# Patient Record
Sex: Male | Born: 1960 | Race: White | Hispanic: No | Marital: Married | Smoking: Current every day smoker
Health system: Southern US, Community
[De-identification: ages and names within clinical notes are randomized; demographics above are authoritative.]

## PROBLEM LIST (undated history)

## (undated) DIAGNOSIS — Z8719 Personal history of other diseases of the digestive system: Secondary | ICD-10-CM

## (undated) DIAGNOSIS — J439 Emphysema, unspecified: Secondary | ICD-10-CM

## (undated) DIAGNOSIS — I219 Acute myocardial infarction, unspecified: Secondary | ICD-10-CM

## (undated) DIAGNOSIS — Z87442 Personal history of urinary calculi: Secondary | ICD-10-CM

## (undated) DIAGNOSIS — I10 Essential (primary) hypertension: Secondary | ICD-10-CM

## (undated) DIAGNOSIS — I719 Aortic aneurysm of unspecified site, without rupture: Secondary | ICD-10-CM

## (undated) DIAGNOSIS — K219 Gastro-esophageal reflux disease without esophagitis: Secondary | ICD-10-CM

## (undated) DIAGNOSIS — L600 Ingrowing nail: Secondary | ICD-10-CM

## (undated) DIAGNOSIS — J449 Chronic obstructive pulmonary disease, unspecified: Secondary | ICD-10-CM

## (undated) DIAGNOSIS — N281 Cyst of kidney, acquired: Secondary | ICD-10-CM

## (undated) DIAGNOSIS — J189 Pneumonia, unspecified organism: Secondary | ICD-10-CM

## (undated) DIAGNOSIS — K2970 Gastritis, unspecified, without bleeding: Secondary | ICD-10-CM

## (undated) DIAGNOSIS — Z720 Tobacco use: Secondary | ICD-10-CM

## (undated) DIAGNOSIS — E785 Hyperlipidemia, unspecified: Secondary | ICD-10-CM

## (undated) DIAGNOSIS — R748 Abnormal levels of other serum enzymes: Secondary | ICD-10-CM

## (undated) DIAGNOSIS — R918 Other nonspecific abnormal finding of lung field: Secondary | ICD-10-CM

## (undated) DIAGNOSIS — J45909 Unspecified asthma, uncomplicated: Secondary | ICD-10-CM

## (undated) DIAGNOSIS — I700 Atherosclerosis of aorta: Secondary | ICD-10-CM

## (undated) DIAGNOSIS — K8000 Calculus of gallbladder with acute cholecystitis without obstruction: Secondary | ICD-10-CM

## (undated) DIAGNOSIS — E119 Type 2 diabetes mellitus without complications: Secondary | ICD-10-CM

## (undated) DIAGNOSIS — K8020 Calculus of gallbladder without cholecystitis without obstruction: Secondary | ICD-10-CM

## (undated) DIAGNOSIS — I2510 Atherosclerotic heart disease of native coronary artery without angina pectoris: Secondary | ICD-10-CM

## (undated) DIAGNOSIS — K7460 Unspecified cirrhosis of liver: Secondary | ICD-10-CM

## (undated) HISTORY — DX: Tobacco use: Z72.0

## (undated) HISTORY — DX: Abnormal levels of other serum enzymes: R74.8

## (undated) HISTORY — DX: Hyperlipidemia, unspecified: E78.5

## (undated) HISTORY — DX: Calculus of gallbladder with acute cholecystitis without obstruction: K80.00

## (undated) HISTORY — DX: Unspecified asthma, uncomplicated: J45.909

## (undated) HISTORY — DX: Gastritis, unspecified, without bleeding: K29.70

## (undated) HISTORY — DX: Essential (primary) hypertension: I10

## (undated) HISTORY — DX: Atherosclerosis of aorta: I70.0

## (undated) HISTORY — PX: APPENDECTOMY: SHX54

## (undated) HISTORY — DX: Gastro-esophageal reflux disease without esophagitis: K21.9

## (undated) HISTORY — DX: Acute myocardial infarction, unspecified: I21.9

## (undated) HISTORY — DX: Atherosclerotic heart disease of native coronary artery without angina pectoris: I25.10

## (undated) HISTORY — DX: Other nonspecific abnormal finding of lung field: R91.8

## (undated) HISTORY — DX: Emphysema, unspecified: J43.9

## (undated) HISTORY — DX: Aortic aneurysm of unspecified site, without rupture: I71.9

## (undated) HISTORY — DX: Cyst of kidney, acquired: N28.1

## (undated) HISTORY — DX: Chronic obstructive pulmonary disease, unspecified: J44.9

## (undated) HISTORY — DX: Type 2 diabetes mellitus without complications: E11.9

## (undated) HISTORY — DX: Unspecified cirrhosis of liver: K74.60

---
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
## 2003-10-25 HISTORY — PX: CORONARY ANGIOPLASTY WITH STENT PLACEMENT: SHX49

## 2008-06-06 ENCOUNTER — Ambulatory Visit: Payer: Self-pay | Admitting: Internal Medicine

## 2009-12-10 ENCOUNTER — Ambulatory Visit: Payer: Self-pay | Admitting: Gastroenterology

## 2010-01-22 ENCOUNTER — Ambulatory Visit: Payer: Self-pay | Admitting: Internal Medicine

## 2010-03-24 ENCOUNTER — Ambulatory Visit: Payer: Self-pay | Admitting: Internal Medicine

## 2010-08-09 ENCOUNTER — Emergency Department: Payer: Self-pay | Admitting: Emergency Medicine

## 2010-09-27 LAB — HM COLONOSCOPY

## 2014-01-16 ENCOUNTER — Ambulatory Visit: Payer: Self-pay

## 2014-02-25 ENCOUNTER — Ambulatory Visit: Payer: Self-pay | Admitting: Family Medicine

## 2014-02-28 ENCOUNTER — Encounter: Payer: Self-pay | Admitting: *Deleted

## 2014-03-19 ENCOUNTER — Ambulatory Visit (INDEPENDENT_AMBULATORY_CARE_PROVIDER_SITE_OTHER): Payer: BC Managed Care – PPO | Admitting: General Surgery

## 2014-03-19 ENCOUNTER — Encounter: Payer: Self-pay | Admitting: General Surgery

## 2014-03-19 VITALS — BP 140/78 | HR 78 | Resp 12 | Ht 68.0 in | Wt 200.0 lb

## 2014-03-19 DIAGNOSIS — R1011 Right upper quadrant pain: Secondary | ICD-10-CM

## 2014-03-19 NOTE — Patient Instructions (Addendum)
Patient to be scheduled for Abdominal Ultrasound. The patient is aware to call back for any questions or concerns.  Patient is scheduled for Abdominal Ultrasound at [facility] on 03/20/14 at 9:15 am. He is to have nothing to eat or drink for 6 hours prior to the ultrasound. Patient is aware of date, time, and instructions.

## 2014-03-19 NOTE — Progress Notes (Signed)
Patient ID: Scott Rosario, male   DOB: Mar 05, 1961, 53 y.o.   MRN: [id number]  Chief Complaint  Patient presents with  . Other    Evaluation of abdominal pain and gallbladder    HPI Scott Rosario is a 53 y.o. male who presents for an evaluation of abdominal pain and gallbladder. The abdominal pain has been present for approximately 3-4 months. Pain is described as sharp stabbing pain that is located in the right lower quadrant. The pain comes and goes. He has had some diarrhea that comes and goes. He avoids salads due to gas/burping. The patient states he had a CT scan done on 02/25/14.   HPI  Past Medical History  Diagnosis Date  . Heart attack   . Diabetes mellitus without complication   . Asthma   . GERD (gastroesophageal reflux disease)   . Hyperlipidemia   . Hypertension     Past Surgical History  Procedure Laterality Date  . Coronary angioplasty with stent placement  10/25/2003    x 2  . Appendectomy      History reviewed. No pertinent family history.  Social History History  Substance Use Topics  . Smoking status: Former Research scientist (life sciences)  . Smokeless tobacco: Never Used  . Alcohol Use: No    No Known Allergies  Current Outpatient Prescriptions  Medication Sig Dispense Refill  . atorvastatin (LIPITOR) 80 MG tablet Take 1 tablet by mouth daily.      . clopidogrel (PLAVIX) 75 MG tablet Take 1 tablet by mouth daily.      Marland Kitchen GLIPIZIDE XL 10 MG 24 hr tablet Take 1 tablet by mouth daily.      Marland Kitchen losartan (COZAAR) 50 MG tablet Take 1 tablet by mouth daily.      . metoprolol succinate (TOPROL-XL) 25 MG 24 hr tablet Take 1 tablet by mouth daily.      Marland Kitchen NEXIUM 40 MG capsule Take 1 capsule by mouth daily.      Marland Kitchen PROAIR HFA 108 (90 BASE) MCG/ACT inhaler       . SPIRIVA HANDIHALER 18 MCG inhalation capsule Place 1 capsule into inhaler and inhale daily.       No current facility-administered medications for this visit.    Review of Systems Review of Systems  Constitutional:  Negative.   Respiratory: Negative.   Cardiovascular: Negative.   Gastrointestinal: Positive for abdominal pain and diarrhea.    Blood pressure 140/78, pulse 78, resp. rate 12, height 5\' 8"  (1.727 m), weight 200 lb (90.719 kg).  Physical Exam Physical Exam  Constitutional: He is oriented to person, place, and time. He appears well-developed and well-nourished.  Eyes: Conjunctivae are normal. No scleral icterus.  Neck: Neck supple. No thyromegaly present.  Cardiovascular: Normal rate, regular rhythm and normal heart sounds.   No murmur heard. Pulmonary/Chest: Effort normal and breath sounds normal.  Abdominal: Soft. Normal appearance and bowel sounds are normal. There is no hepatosplenomegaly. There is no tenderness. No hernia.  Lymphadenopathy:    He has no cervical adenopathy.  Neurological: He is alert and oriented to person, place, and time.  Skin: Skin is warm and dry.    Data Reviewed   Abdominal/chest CT Scan. Polyp or stone  seen in gallbladder  Assessment    Symptoms are not typical but may well be from chronic cholecystitis and stones. Need confirmation of stones by [place]. Discussed fully with pt and he is agreeable.    Plan    Patient to be scheduled for abdominal  ultrasound. Patient to have CBC and Hepatic Function drawn today.     Patient is scheduled for Abdominal Ultrasound at [facility] on 03/20/14 at 9:15 am. He is to have nothing to eat or drink for 6 hours prior to the ultrasound. Patient is aware of date, time, and instructions.    SANKAR,SEEPLAPUTHUR G 03/20/2014, 7:04 AM

## 2014-03-20 ENCOUNTER — Encounter: Payer: Self-pay | Admitting: General Surgery

## 2014-03-20 LAB — CBC WITH DIFFERENTIAL
BASOS ABS: 0 10*3/uL (ref 0.0–0.2)
BASOS: 1 %
EOS: 3 %
Eosinophils Absolute: 0.2 10*3/uL (ref 0.0–0.4)
HEMATOCRIT: 40.9 % (ref 37.5–51.0)
Hemoglobin: 13.6 g/dL (ref 12.6–17.7)
Immature Grans (Abs): 0 10*3/uL (ref 0.0–0.1)
Immature Granulocytes: 0 %
LYMPHS: 19 %
Lymphocytes Absolute: 1.7 10*3/uL (ref 0.7–3.1)
MCH: 28.9 pg (ref 26.6–33.0)
MCHC: 33.3 g/dL (ref 31.5–35.7)
MCV: 87 fL (ref 79–97)
MONOCYTES: 5 %
Monocytes Absolute: 0.5 10*3/uL (ref 0.1–0.9)
NEUTROS PCT: 72 %
Neutrophils Absolute: 6.4 10*3/uL (ref 1.4–7.0)
Platelets: 175 10*3/uL (ref 150–379)
RBC: 4.71 x10E6/uL (ref 4.14–5.80)
RDW: 13.8 % (ref 12.3–15.4)
WBC: 8.9 10*3/uL (ref 3.4–10.8)

## 2014-03-20 LAB — HEPATIC FUNCTION PANEL
ALK PHOS: 148 IU/L — AB (ref 39–117)
ALT: 20 IU/L (ref 0–44)
AST: 15 IU/L (ref 0–40)
Albumin: 4.3 g/dL (ref 3.5–5.5)
BILIRUBIN TOTAL: 0.9 mg/dL (ref 0.0–1.2)
Bilirubin, Direct: 0.23 mg/dL (ref 0.00–0.40)
Total Protein: 6.2 g/dL (ref 6.0–8.5)

## 2014-03-25 ENCOUNTER — Telehealth: Payer: Self-pay

## 2014-03-25 ENCOUNTER — Telehealth: Payer: Self-pay | Admitting: General Surgery

## 2014-03-25 NOTE — Telephone Encounter (Signed)
Message left for patient to call back for results and to scheduled a follow up in one month.

## 2014-03-25 NOTE — Telephone Encounter (Signed)
Pt advised on results of labs and [place]. There is a mobile sludge ball in gb, no signs of acute cholecystitis. Not certain if his abd pain isn from gallbladder.  Pt advised and feel a recheck in 1 mo is appropriate. Pt agreeable with this plan.  He is to call if he has worsening pain, n/v , fever in the interval

## 2014-03-25 NOTE — Telephone Encounter (Signed)
Message copied by Lesly Rubenstein on Mon Mar 25, 2014 11:41 AM ------      Message from: Christene Lye      Created: Mon Mar 25, 2014  8:18 AM       Pt had normal LFTs. US shows a sludge ball, mobile. No stones seen, no pericholecystic fluid.       At present it is uncertain if his abd pain is result of this [place] finding. Pt advised by phone. Recheck in 1 mo- needs appt scheduled. ------

## 2014-04-08 NOTE — Telephone Encounter (Signed)
Spoke with patient and he is amendable to follow up in one month. Patient is scheduled for a follow up appointment on 04/24/14 at 3:15 pm.

## 2014-04-24 ENCOUNTER — Ambulatory Visit: Payer: BC Managed Care – PPO | Admitting: General Surgery

## 2014-05-21 ENCOUNTER — Encounter: Payer: Self-pay | Admitting: *Deleted

## 2014-11-18 ENCOUNTER — Ambulatory Visit: Payer: Self-pay | Admitting: Physician Assistant

## 2014-12-02 ENCOUNTER — Ambulatory Visit: Payer: Self-pay | Admitting: Family Medicine

## 2014-12-10 DIAGNOSIS — I214 Non-ST elevation (NSTEMI) myocardial infarction: Secondary | ICD-10-CM | POA: Insufficient documentation

## 2014-12-12 ENCOUNTER — Institutional Professional Consult (permissible substitution): Payer: Self-pay | Admitting: Internal Medicine

## 2014-12-31 ENCOUNTER — Encounter: Payer: Self-pay | Admitting: Internal Medicine

## 2014-12-31 ENCOUNTER — Encounter (INDEPENDENT_AMBULATORY_CARE_PROVIDER_SITE_OTHER): Payer: Self-pay

## 2014-12-31 ENCOUNTER — Ambulatory Visit (INDEPENDENT_AMBULATORY_CARE_PROVIDER_SITE_OTHER): Payer: 59 | Admitting: Internal Medicine

## 2014-12-31 VITALS — BP 124/78 | HR 68 | Temp 98.7°F | Ht 70.0 in | Wt 194.0 lb

## 2014-12-31 DIAGNOSIS — R918 Other nonspecific abnormal finding of lung field: Secondary | ICD-10-CM | POA: Diagnosis not present

## 2014-12-31 DIAGNOSIS — Z720 Tobacco use: Secondary | ICD-10-CM

## 2014-12-31 DIAGNOSIS — J449 Chronic obstructive pulmonary disease, unspecified: Secondary | ICD-10-CM

## 2014-12-31 MED ORDER — TIOTROPIUM BROMIDE MONOHYDRATE 18 MCG IN CAPS: 1.0000 | ORAL_CAPSULE | Freq: Every day | RESPIRATORY_TRACT | Status: DC

## 2014-12-31 MED ORDER — ALBUTEROL SULFATE HFA 108 (90 BASE) MCG/ACT IN AERS: 2.0000 | INHALATION_SPRAY | RESPIRATORY_TRACT | Status: DC | PRN

## 2014-12-31 NOTE — Progress Notes (Signed)
Date: 12/31/2014  MRN# [id number] Perfecto Purdy 1960/11/02  Referring Physician: Dr. Enid Derry  Scott Rosario is a 54 y.o. old male seen in consultation for COPD workup and multiple pulmonary nodules.   CC:  Chief Complaint  Patient presents with  . Advice Only    Referred by Dr. Sanda Klein for emphysema. Pt has some cough, sob with exertion, wheezing, pt denies chest tightness. This has been going on 3-6 months. Pt was prescribed Spiriva and a rescue inhaler which he does not use.    HPI:  Patient is a pleasant 54 year old male presents today for further work up of multiple pulmonary lung nodules and cough and COPD optimization. Patient states that he has a chronic productive morning cough sometimes with thick white sputum for the past 6 months, intermittently he has had a yellow greenish sputum for which he received antibiotics for the past. He endorses dyspnea on exertion while at work, he works as a Development worker, community. Patient states that over the past 6-8 years he gets a "upper respiratory tract infection" about twice a year. Given his recent respiratory tract infection, history of smoking, his primary care physician ordered a follow-up CT chest which showed multiple pulmonary nodules (see details below) along with panlobular and centrilobular emphysema in the background. At that time he was given a prescription for Spiriva, February 2016, but states that he has not been using it. She states that he's been smoking tobacco for about 40 years, up to 3 packs per day, now down to one pack per day. He has a history of hypertension, coronary artery disease status post stent placement, diabetes.   PMHX:   Past Medical History  Diagnosis Date  . Heart attack   . Diabetes mellitus without complication   . Asthma   . GERD (gastroesophageal reflux disease)   . Hyperlipidemia   . Hypertension    Surgical Hx:  Past Surgical History  Procedure Laterality Date  . Coronary angioplasty with stent  placement  10/25/2003    x 2  . Appendectomy     Family Hx:  Family History  Problem Relation Age of Onset  . Diabetes Mother   . Hypertension Mother   . Heart attack Father   . Epilepsy Father   . Diabetes Father    Social Hx:   History  Substance Use Topics  . Smoking status: Current Every Day Smoker -- 1.00 packs/day for 40 years    Types: Cigarettes  . Smokeless tobacco: Never Used  . Alcohol Use: No   Medication:   Current Outpatient Rx  Name  Route  Sig  Dispense  Refill  . atorvastatin (LIPITOR) 80 MG tablet   Oral   Take 1 tablet by mouth daily.         . canagliflozin (INVOKANA) 100 MG TABS tablet   Oral   Take 100 mg by mouth daily.         . clopidogrel (PLAVIX) 75 MG tablet   Oral   Take 1 tablet by mouth daily.         Marland Kitchen GLIPIZIDE XL 10 MG 24 hr tablet   Oral   Take 1 tablet by mouth daily.         Marland Kitchen losartan (COZAAR) 50 MG tablet   Oral   Take 1 tablet by mouth daily.         . metoprolol succinate (TOPROL-XL) 25 MG 24 hr tablet   Oral   Take 1 tablet by mouth daily.         Marland Kitchen  pantoprazole (PROTONIX) 40 MG tablet   Oral   Take 40 mg by mouth daily.         Marland Kitchen PROAIR HFA 108 (90 BASE) MCG/ACT inhaler               . SPIRIVA HANDIHALER 18 MCG inhalation capsule   Inhalation   Place 1 capsule into inhaler and inhale daily.             Allergies:  Review of patient's allergies indicates no known allergies.  Review of Systems: Gen:  Denies  fever, sweats, chills HEENT: Denies blurred vision, double vision, ear pain, eye pain, hearing loss, nose bleeds, sore throat Cvc:  No dizziness, chest pain or heaviness Resp:   Mild DOE, chronic morning cough mild productive Gi: Denies swallowing difficulty, stomach pain, nausea or vomiting, diarrhea, constipation, bowel incontinence Gu:  Denies bladder incontinence, burning urine Ext:   No Joint pain, stiffness or swelling Skin: No skin rash, easy bruising or bleeding or  hives Endoc:  No polyuria, polydipsia , polyphagia or weight change Psych: No depression, insomnia or hallucinations  Other:  All other systems negative  Physical Examination:   VS: BP 124/78 mmHg  Pulse 68  Temp(Src) 98.7 F (37.1 C) (Oral)  Ht 5\' 10"  (1.778 m)  Wt 194 lb (87.998 kg)  BMI 27.84 kg/m2  SpO2 97%  General Appearance: No distress  Neuro:without focal findings, mental status, speech normal, alert and oriented, cranial nerves 2-12 intact, reflexes normal and symmetric, sensation grossly normal  HEENT: PERRLA, EOM intact, no ptosis, no other lesions noticed; Mallampati 3 Pulmonary: normal breath sounds., diaphragmatic excursion normal.No wheezing, No rales;   Sputum Production:  none CardiovascularNormal S1,S2.  No m/r/g.  Abdominal aorta pulsation normal.    Abdomen: Benign, Soft, non-tender, No masses, hepatosplenomegaly, No lymphadenopathy Renal:  No costovertebral tenderness  GU:  No performed at this time. Endoc: No evident thyromegaly, no signs of acromegaly or Cushing features Skin:   warm, no rashes, no ecchymosis  Extremities: normal, no cyanosis, clubbing, no edema, warm with normal capillary refill. Other findings:none  Labs results:   Rad results: (The following images and results were reviewed by Dr. Stevenson Clinch). CT Chest  12/02/14 CLINICAL DATA: 53 year old male with Cough and congestion. Prior chest CT demonstrating pulmonary nodules. Follow-up examination.  EXAM: CT CHEST WITHOUT CONTRAST  TECHNIQUE: Multidetector CT imaging of the chest was performed following the standard protocol without IV contrast..  COMPARISON: Chest CT 02/25/2014.  FINDINGS: Mediastinum/Lymph Nodes: Heart size is normal. There is no significant pericardial fluid, thickening or pericardial calcification. There is atherosclerosis of the thoracic aorta, the great vessels of the mediastinum and the coronary arteries, including calcified atherosclerotic plaque in the left  anterior descending, left circumflex and right coronary arteries. Borderline enlarged mediastinal lymph nodes are noted, better nonspecific and are similar to the prior examination. No definite pathologically enlarged mediastinal or hilar lymph nodes. Please note that accurate exclusion of hilar adenopathy is limited on noncontrast CT scans. Esophagus is unremarkable in appearance. No axillary lymphadenopathy.  Lungs/Pleura: Multiple small pulmonary nodules are again noted, unchanged compared to the prior examination. This includes a 5 mm left upper lobe ground-glass attenuation nodule (image 21 of series 4), a 5 mm subpleural nodule associated with the major fissure in the superior aspect of the left lower lobe (image 26 of series 4), and a 5 mm left lower lobe nodule (image 49 of series 4). 4 mm right middle lobe nodule associated with the minor  fissure (image 37 of series 4), and 3 mm right upper lobe nodule (image 29 of series 4) are also unchanged in retrospect compared to the prior examination. No new suspicious appearing pulmonary nodules or masses are otherwise noted. No acute consolidative airspace disease. No pleural effusions. Mild diffuse bronchial wall thickening. Mild centrilobular and paraseptal emphysema, most evident in the lung apices bilaterally.  Upper Abdomen: Unremarkable.  Musculoskeletal/Soft Tissues: There are no aggressive appearing lytic or blastic lesions noted in the visualized portions of the skeleton.  IMPRESSION: 1. All previously noted pulmonary nodules are unchanged in size, number and distribution compared to the prior examination, and favored to be benign. One additional 12 month follow-up is recommended to ensure continued stability at this time. 2. Mild diffuse bronchial wall thickening with mild centrilobular and paraseptal emphysema. 3. Atherosclerosis, including three vessel coronary artery disease. Please note that although the presence  of coronary artery calcium documents the presence of coronary artery disease, the severity of this disease and any potential stenosis cannot be assessed on this non-gated CT examination. Assessment for potential risk factor modification, dietary therapy or pharmacologic therapy may be warranted, if clinically indicated.  Assessment and Plan: A 54 year old male past medical history of hypertension, diabetes, coronary artery disease status post placement, recent diagnosis of clinical COPD, multiple pulmonary lung nodules seen in consultation today. Multiple pulmonary nodules determined by computed tomography of lung Differential diagnosis includes: Granulomatous disease, malignancy, sarcoid, prior infection  Patient with significant smoking history. However, nodules from 9 months ago are about the same size. Will plan for follow-up CT chest in 6 months  Plan: -Given his smoking history Will plan for follow-up CT chest in 6 months with contrast. -Avoid tobacco   COPD mixed type Patient with COPD given his clinical appearance. COPD is mostly a mixed type (emphysema, bronchitis) CT chest in March 2015 with biapical emphysematous changes in both paraseptal and centrilobular.  He was given Spiriva by his primary care physician but has not been using it. I have counseled him on the use of Spiriva, and side effects, and proper menstruation of the drug. Also, I have educated patient to use a rescue inhaler, albuterol, and proper administration.  Plan: - pulmonary function testing and 6 minute walk test prior to follow up - avoid tobacco - using Spiriva on a daily basis - albuterol RESCUE inhaler - 2puff every 3-4 hours ONLY as needed for shortness of breath\wheezing\recurrent cough - we will plan for a repeat CT Chest with contrast in 6 months for pulmonary nodules.    Tobacco abuse Tobacco Cessation - Counseling regarding benefits of smoking cessation strategies was provided for more than  12 min. - Educated that at this time smoking- cessation represents the single most important step that patient can take to enhance the length and quality of live. - Educated patient regarding alternatives of behavior interventions, pharmacotherapy including NRT and non-nicotine therapy such, and combinations of both. - Patient at this time: He smoked 3 packs per day, now down to one pack per day. Patient is going to try to quit on his own.     Updated Medication List Outpatient Encounter Prescriptions as of 12/31/2014  Medication Sig  . albuterol (PROAIR HFA) 108 (90 BASE) MCG/ACT inhaler Inhale 2 puffs into the lungs every 4 (four) hours as needed for wheezing or shortness of breath.  Marland Kitchen atorvastatin (LIPITOR) 80 MG tablet Take 1 tablet by mouth daily.  . canagliflozin (INVOKANA) 100 MG TABS tablet Take 100 mg by  mouth daily.  . clopidogrel (PLAVIX) 75 MG tablet Take 1 tablet by mouth daily.  Marland Kitchen GLIPIZIDE XL 10 MG 24 hr tablet Take 1 tablet by mouth daily.  Marland Kitchen losartan (COZAAR) 50 MG tablet Take 1 tablet by mouth daily.  . metoprolol succinate (TOPROL-XL) 25 MG 24 hr tablet Take 1 tablet by mouth daily.  . pantoprazole (PROTONIX) 40 MG tablet Take 40 mg by mouth daily.  . [DISCONTINUED] PROAIR HFA 108 (90 BASE) MCG/ACT inhaler   . tiotropium (SPIRIVA HANDIHALER) 18 MCG inhalation capsule Place 1 capsule (18 mcg total) into inhaler and inhale daily.  . [DISCONTINUED] NEXIUM 40 MG capsule Take 1 capsule by mouth daily.  . [DISCONTINUED] SPIRIVA HANDIHALER 18 MCG inhalation capsule Place 1 capsule into inhaler and inhale daily.    Orders for this visit: Orders Placed This Encounter  Procedures  . CT Chest W Contrast    Standing Status: Future     Number of Occurrences:      Standing Expiration Date: 03/01/2016    Scheduling Instructions:     ON HOLD UNTIL 06/2015     Dr. Dessie Coma or Rosario Jacks to read    Order Specific Question:  Reason for Exam (SYMPTOM  OR DIAGNOSIS REQUIRED)    Answer:   EMPHYSEMA    Order Specific Question:  Preferred imaging location?    Answer:  [facility]     Thank  you for the consultation and for allowing [facility] to assist in the care of your patient. Our recommendations are noted above.  Please contact us if we can be of further service.   Vilinda Boehringer, MD [facility] Office Number: [phone]

## 2014-12-31 NOTE — Patient Instructions (Signed)
Follow up with Dr. Stevenson Clinch in 6 weeks - pulmonary function testing and 6 minute walk test prior to follow up - avoid tobacco - using Spiriva on a daily basis - albuterol RESCUE inhaler - 2puff every 3-4 hours ONLY as needed for shortness of breath\wheezing\recurrent cough - we will plan for a repeat CT Chest with contrast in 6 months for pulmonary nodules.

## 2014-12-31 NOTE — Assessment & Plan Note (Signed)
Tobacco Cessation - Counseling regarding benefits of smoking cessation strategies was provided for more than 12 min. - Educated that at this time smoking- cessation represents the single most important step that patient can take to enhance the length and quality of live. - Educated patient regarding alternatives of behavior interventions, pharmacotherapy including NRT and non-nicotine therapy such, and combinations of both. - Patient at this time: He smoked 3 packs per day, now down to one pack per day. Patient is going to try to quit on his own.

## 2014-12-31 NOTE — Assessment & Plan Note (Signed)
Differential diagnosis includes: Granulomatous disease, malignancy, sarcoid, prior infection  Patient with significant smoking history. However, nodules from 9 months ago are about the same size. Will plan for follow-up CT chest in 6 months  Plan: -Given his smoking history Will plan for follow-up CT chest in 6 months with contrast. -Avoid tobacco

## 2014-12-31 NOTE — Assessment & Plan Note (Signed)
Patient with COPD given his clinical appearance. COPD is mostly a mixed type (emphysema, bronchitis) CT chest in March 2015 with biapical emphysematous changes in both paraseptal and centrilobular.  He was given Spiriva by his primary care physician but has not been using it. I have counseled him on the use of Spiriva, and side effects, and proper menstruation of the drug. Also, I have educated patient to use a rescue inhaler, albuterol, and proper administration.  Plan: - pulmonary function testing and 6 minute walk test prior to follow up - avoid tobacco - using Spiriva on a daily basis - albuterol RESCUE inhaler - 2puff every 3-4 hours ONLY as needed for shortness of breath\wheezing\recurrent cough - we will plan for a repeat CT Chest with contrast in 6 months for pulmonary nodules.

## 2015-01-02 ENCOUNTER — Ambulatory Visit
Admit: 2015-01-02 | Disposition: A | Discharge status: Home / Self Care | Payer: Self-pay | Attending: Family Medicine | Admitting: Family Medicine

## 2015-01-03 ENCOUNTER — Other Ambulatory Visit: Payer: Self-pay | Admitting: *Deleted

## 2015-01-03 DIAGNOSIS — J449 Chronic obstructive pulmonary disease, unspecified: Secondary | ICD-10-CM

## 2015-01-03 DIAGNOSIS — R918 Other nonspecific abnormal finding of lung field: Secondary | ICD-10-CM

## 2015-01-03 NOTE — Addendum Note (Signed)
Addended by: Devona Konig on: 01/03/2015 08:43 AM   Modules accepted: Orders

## 2015-02-20 ENCOUNTER — Ambulatory Visit: Payer: 59 | Admitting: Internal Medicine

## 2015-02-20 ENCOUNTER — Ambulatory Visit: Payer: 59

## 2015-03-18 ENCOUNTER — Other Ambulatory Visit: Payer: Self-pay | Admitting: Family Medicine

## 2015-03-18 NOTE — Telephone Encounter (Signed)
Routing to provider  

## 2015-03-18 NOTE — Telephone Encounter (Signed)
E-Fax came through for refill: Rx: pantoprazole (PROTONIX) 40 MG tablet Rx copy in basket

## 2015-03-19 MED ORDER — GLIPIZIDE ER 10 MG PO TB24: 10.0000 mg | ORAL_TABLET | Freq: Every day | ORAL | Status: DC

## 2015-03-19 NOTE — Telephone Encounter (Signed)
I'm actually going to ask patient to get this from now on from the GI doctor They are evaluating him for his liver, so I'd suggest they manage this and prescribe as well The dose may need to be adjusted

## 2015-03-19 NOTE — Telephone Encounter (Signed)
Dr. Sanda Klein, he also has a refill request for Glipizide that needs to be refilled.

## 2015-03-19 NOTE — Telephone Encounter (Signed)
Left detailed message for patient to call his GI doctor for med refill.

## 2015-04-14 ENCOUNTER — Other Ambulatory Visit: Payer: Self-pay

## 2015-04-14 ENCOUNTER — Telehealth: Payer: Self-pay | Admitting: Family Medicine

## 2015-04-14 MED ORDER — ATORVASTATIN CALCIUM 80 MG PO TABS: 80.0000 mg | ORAL_TABLET | Freq: Every day | ORAL | Status: DC

## 2015-04-14 NOTE — Telephone Encounter (Signed)
Please let Chrles Selley know that I'd like to see patient for an appointment here in the office for:  diabetes Please schedule a visit with me  in the next:  month Fasting?  Yes, please Thank you, Dr. Sanda Klein

## 2015-04-14 NOTE — Telephone Encounter (Signed)
Routing to provider  

## 2015-04-14 NOTE — Telephone Encounter (Signed)
Patient was last seen on 11/21/14 for hyperlipidemia, practice partner number is [phone], and pharmacy is [facility].

## 2015-04-18 NOTE — Telephone Encounter (Signed)
Left voicemail on pt cell to call us back and schedule a f/u with fasting labs in the next month.

## 2015-05-05 ENCOUNTER — Other Ambulatory Visit: Payer: Self-pay

## 2015-05-05 DIAGNOSIS — E119 Type 2 diabetes mellitus without complications: Secondary | ICD-10-CM

## 2015-05-05 DIAGNOSIS — E785 Hyperlipidemia, unspecified: Secondary | ICD-10-CM

## 2015-05-05 DIAGNOSIS — R748 Abnormal levels of other serum enzymes: Secondary | ICD-10-CM

## 2015-05-06 ENCOUNTER — Other Ambulatory Visit: Payer: Self-pay | Admitting: Family Medicine

## 2015-05-06 DIAGNOSIS — R748 Abnormal levels of other serum enzymes: Secondary | ICD-10-CM

## 2015-05-13 ENCOUNTER — Other Ambulatory Visit: Payer: 59

## 2015-05-13 DIAGNOSIS — J449 Chronic obstructive pulmonary disease, unspecified: Secondary | ICD-10-CM | POA: Insufficient documentation

## 2015-05-13 DIAGNOSIS — I2510 Atherosclerotic heart disease of native coronary artery without angina pectoris: Secondary | ICD-10-CM | POA: Insufficient documentation

## 2015-05-13 DIAGNOSIS — R748 Abnormal levels of other serum enzymes: Secondary | ICD-10-CM

## 2015-05-13 DIAGNOSIS — IMO0002 Reserved for concepts with insufficient information to code with codable children: Secondary | ICD-10-CM | POA: Insufficient documentation

## 2015-05-13 DIAGNOSIS — I700 Atherosclerosis of aorta: Secondary | ICD-10-CM | POA: Insufficient documentation

## 2015-05-13 DIAGNOSIS — E1169 Type 2 diabetes mellitus with other specified complication: Secondary | ICD-10-CM | POA: Insufficient documentation

## 2015-05-13 DIAGNOSIS — I10 Essential (primary) hypertension: Secondary | ICD-10-CM | POA: Insufficient documentation

## 2015-05-13 DIAGNOSIS — J432 Centrilobular emphysema: Secondary | ICD-10-CM | POA: Insufficient documentation

## 2015-05-13 DIAGNOSIS — E1165 Type 2 diabetes mellitus with hyperglycemia: Secondary | ICD-10-CM | POA: Insufficient documentation

## 2015-05-13 DIAGNOSIS — E119 Type 2 diabetes mellitus without complications: Secondary | ICD-10-CM

## 2015-05-13 DIAGNOSIS — R918 Other nonspecific abnormal finding of lung field: Secondary | ICD-10-CM | POA: Insufficient documentation

## 2015-05-13 DIAGNOSIS — K219 Gastro-esophageal reflux disease without esophagitis: Secondary | ICD-10-CM | POA: Insufficient documentation

## 2015-05-13 DIAGNOSIS — K7460 Unspecified cirrhosis of liver: Secondary | ICD-10-CM | POA: Insufficient documentation

## 2015-05-13 DIAGNOSIS — Z720 Tobacco use: Secondary | ICD-10-CM | POA: Insufficient documentation

## 2015-05-13 DIAGNOSIS — E785 Hyperlipidemia, unspecified: Secondary | ICD-10-CM

## 2015-05-13 DIAGNOSIS — J439 Emphysema, unspecified: Secondary | ICD-10-CM | POA: Insufficient documentation

## 2015-05-14 LAB — COMPREHENSIVE METABOLIC PANEL
ALT: 27 IU/L (ref 0–44)
AST: 20 IU/L (ref 0–40)
Albumin/Globulin Ratio: 2 (ref 1.1–2.5)
Albumin: 4.3 g/dL (ref 3.5–5.5)
Alkaline Phosphatase: 138 IU/L — ABNORMAL HIGH (ref 39–117)
BUN/Creatinine Ratio: 19 (ref 9–20)
BUN: 17 mg/dL (ref 6–24)
Bilirubin Total: 1.3 mg/dL — ABNORMAL HIGH (ref 0.0–1.2)
CALCIUM: 9 mg/dL (ref 8.7–10.2)
CO2: 21 mmol/L (ref 18–29)
CREATININE: 0.89 mg/dL (ref 0.76–1.27)
Chloride: 103 mmol/L (ref 97–108)
GFR calc Af Amer: 111 mL/min/{1.73_m2} (ref >59)
GFR, EST NON AFRICAN AMERICAN: 96 mL/min/{1.73_m2} (ref >59)
GLOBULIN, TOTAL: 2.1 g/dL (ref 1.5–4.5)
Glucose: 155 mg/dL — ABNORMAL HIGH (ref 65–99)
Potassium: 4.7 mmol/L (ref 3.5–5.2)
SODIUM: 140 mmol/L (ref 134–144)
Total Protein: 6.4 g/dL (ref 6.0–8.5)

## 2015-05-14 LAB — LIPID PANEL W/O CHOL/HDL RATIO
CHOLESTEROL TOTAL: 111 mg/dL (ref 100–199)
HDL: 26 mg/dL — ABNORMAL LOW (ref >39)
LDL Calculated: 66 mg/dL (ref 0–99)
Triglycerides: 95 mg/dL (ref 0–149)
VLDL CHOLESTEROL CAL: 19 mg/dL (ref 5–40)

## 2015-05-14 LAB — HGB A1C W/O EAG: HEMOGLOBIN A1C: 7.4 % — AB (ref 4.8–5.6)

## 2015-05-16 ENCOUNTER — Encounter: Payer: Self-pay | Admitting: Family Medicine

## 2015-05-16 ENCOUNTER — Ambulatory Visit (INDEPENDENT_AMBULATORY_CARE_PROVIDER_SITE_OTHER): Payer: 59 | Admitting: Family Medicine

## 2015-05-16 VITALS — BP 133/75 | HR 78 | Temp 98.2°F | Ht 66.75 in | Wt 190.0 lb

## 2015-05-16 DIAGNOSIS — Z720 Tobacco use: Secondary | ICD-10-CM

## 2015-05-16 DIAGNOSIS — E119 Type 2 diabetes mellitus without complications: Secondary | ICD-10-CM

## 2015-05-16 DIAGNOSIS — I2583 Coronary atherosclerosis due to lipid rich plaque: Secondary | ICD-10-CM

## 2015-05-16 DIAGNOSIS — K7469 Other cirrhosis of liver: Secondary | ICD-10-CM

## 2015-05-16 DIAGNOSIS — I2510 Atherosclerotic heart disease of native coronary artery without angina pectoris: Secondary | ICD-10-CM

## 2015-05-16 DIAGNOSIS — E785 Hyperlipidemia, unspecified: Secondary | ICD-10-CM

## 2015-05-16 DIAGNOSIS — K219 Gastro-esophageal reflux disease without esophagitis: Secondary | ICD-10-CM | POA: Diagnosis not present

## 2015-05-16 MED ORDER — PANTOPRAZOLE SODIUM 40 MG PO TBEC: 40.0000 mg | DELAYED_RELEASE_TABLET | Freq: Every day | ORAL | Status: DC

## 2015-05-16 NOTE — Progress Notes (Signed)
BP 133/75 mmHg  Pulse 78  Temp(Src) 98.2 F (36.8 C)  Ht 5' 6.75" (1.695 m)  Wt 190 lb (86.183 kg)  BMI 30.00 kg/m2  SpO2 97%   Subjective:    Patient ID: Scott Rosario, male    DOB: 05/03/1961, 54 y.o.   MRN: [id number]  HPI: Scott Rosario is a 54 y.o. male  Chief Complaint  Patient presents with  . Diabetes  . Hypertension  . Hyperlipidemia   Diabetes; just had A1C done, 7.6; he will see eye doctor soon; not checking FSBS regularly  High blood pressure; not adding salt to food; no decongestants; no chest pain; uses aleve just occasionally  High cholesterol; maybe twelve eggs a week  He is getting a lot of sun; not using sunscreen; easy bruising on the skin of the arms  He picked up some omeprazole 20 mg at the pharmacy; takes at night for acid reflux; helping; however, he is on Plavix  Relevant past medical, surgical, family and social history reviewed and updated as indicated. Interim medical history since our last visit reviewed. Allergies and medications reviewed and updated.  Review of Systems  Constitutional: Negative for unexpected weight change.  HENT: Negative for nosebleeds.   Eyes: Positive for visual disturbance.       Due to see eye doctor soon  Cardiovascular: Negative for chest pain.  Gastrointestinal: Negative for abdominal pain and blood in stool.  Genitourinary: Negative for hematuria.  Hematological: Bruises/bleeds easily.   Per HPI unless specifically indicated above     Objective:    BP 133/75 mmHg  Pulse 78  Temp(Src) 98.2 F (36.8 C)  Ht 5' 6.75" (1.695 m)  Wt 190 lb (86.183 kg)  BMI 30.00 kg/m2  SpO2 97%  Wt Readings from Last 3 Encounters:  05/16/15 190 lb (86.183 kg)  12/06/14 191 lb (86.637 kg)  12/31/14 194 lb (87.998 kg)    Physical Exam  Constitutional: He appears well-developed and well-nourished. No distress.  HENT:  Head: Normocephalic and atraumatic.  Eyes: EOM are normal. No scleral icterus.  Neck:  No thyromegaly present.  Cardiovascular: Normal rate and regular rhythm.   Pulmonary/Chest: Effort normal and breath sounds normal.  Abdominal: Soft. Bowel sounds are normal. He exhibits no distension.  Musculoskeletal: He exhibits no edema.  Neurological: Coordination normal.  Skin: Skin is warm and dry. No pallor.  Tanned skin on chest and arms; solar ecchymoses on the extensor surfaces of both arms, scratches on the legs  Psychiatric: He has a normal mood and affect. His behavior is normal. Judgment and thought content normal.   Diabetic Foot Form - Detailed   Diabetic Foot Exam - detailed  Diabetic Foot exam was performed with the following findings:  Yes 05/16/2015  4:21 PM  Visual Foot Exam completed.:  Yes  Are the shoes appropriate in style and fit?:  Yes  Are the toenails long?:  No  Are the toenails thick?:  No  Are the toenails ingrown?:  No    Pulse Foot Exam completed.:  Yes  Right Dorsalis Pedis:  Present Left Dorsalis Pedis:  Present  Sensory Foot Exam Completed.:  Yes  Swelling:  No  Semmes-Weinstein Monofilament Test  R Site 1-Great Toe:  Pos L Site 1-Great Toe:  Pos  R Site 4:  Pos L Site 4:  Pos       Results for orders placed or performed in visit on 05/13/15  HM COLONOSCOPY  Result Value Ref Range  HM Colonoscopy per PP       Assessment & Plan:   Problem List Items Addressed This Visit      Cardiovascular and Mediastinum   CAD (coronary artery disease)    Aspirin, plavix, statin, beta-blocker; he unfortunately continues to smoke      Relevant Medications   atorvastatin (LIPITOR) 80 MG tablet     Digestive   GERD (gastroesophageal reflux disease)    Okay to take PPI but I instructed him to STOP omeprazole and use pantoprazole since he takes Plavix; other PPIs may cause Plavix to be less effective; avoid triggers for reflux, and I reviewed a list of those (caffeine, spicy foods, tomato-based foods, onions, etc.); risks of PPI discussed, do get  adequate calcium and iron and magnesium while taking      Relevant Medications   pantoprazole (PROTONIX) 40 MG tablet   Cirrhosis    Patient to see GI specialist      Relevant Orders   Ambulatory referral to Gastroenterology     Endocrine   Diabetes mellitus without complication - Primary    Last A1C reviewed; he wishes to work harder on diet rather than add medicine at this time; recheck in 3 months, A1C at that visit; foot exam by MD today; he is unfortunately not ready to quit smoking      Relevant Medications   atorvastatin (LIPITOR) 80 MG tablet     Other   Tobacco abuse    He is not ready to quit, unfortunately; he has seen pulmonologist, Dr. Stevenson Clinch      Hyperlipidemia    Reviewed last lipid panel with him; LDL at goal      Relevant Medications   atorvastatin (LIPITOR) 80 MG tablet      Follow up plan: Return in about 3 months (around 08/16/2015) for diabetes, chol, do come fasting.  An after-visit summary was printed and given to the patient at [place].  Please see the patient instructions which may contain other information and recommendations beyond what is mentioned above in the assessment and plan.  Orders Placed This Encounter  Procedures  . Ambulatory referral to Gastroenterology   Meds ordered this encounter  Medications  . DISCONTD: omeprazole (PRILOSEC) 20 MG capsule    Sig: Take 20 mg by mouth daily.  Marland Kitchen atorvastatin (LIPITOR) 80 MG tablet    Sig: Take 80 mg by mouth at bedtime.  . pantoprazole (PROTONIX) 40 MG tablet    Sig: Take 1 tablet (40 mg total) by mouth daily.    Dispense:  30 tablet    Refill:  2

## 2015-05-16 NOTE — Assessment & Plan Note (Addendum)
Okay to take PPI but I instructed him to STOP omeprazole and use pantoprazole since he takes Plavix; other PPIs may cause Plavix to be less effective; avoid triggers for reflux, and I reviewed a list of those (caffeine, spicy foods, tomato-based foods, onions, etc.); risks of PPI discussed, do get adequate calcium and iron and magnesium while taking

## 2015-05-16 NOTE — Patient Instructions (Addendum)
Limit egg yolks to no more than 3 per week Limit saturated fats like bacon and sausage and hot dogs and bologna and hamburgers STOP the omeprazole Start the new medicine for reflux Do try to use sunscreen GINA -- please ask Tiffany Foxx to check on the referral to GI made earlier, patient to see Dr. Allen Norris please Keep decreasing cigarettes and really do think about quitting Limit sweets and sugary drinks and white bread and rice and biscuits Return in 3 months for follow-up of diabetes, cholesterol, and please come fasting for next labs

## 2015-05-18 NOTE — Assessment & Plan Note (Signed)
He is not ready to quit, unfortunately; he has seen pulmonologist, Dr. Stevenson Clinch

## 2015-05-18 NOTE — Assessment & Plan Note (Signed)
Last A1C reviewed; he wishes to work harder on diet rather than add medicine at this time; recheck in 3 months, A1C at that visit; foot exam by MD today; he is unfortunately not ready to quit smoking

## 2015-05-18 NOTE — Assessment & Plan Note (Signed)
Reviewed last lipid panel with him; LDL at goal

## 2015-05-18 NOTE — Assessment & Plan Note (Signed)
Patient to see GI specialist

## 2015-05-18 NOTE — Assessment & Plan Note (Signed)
Aspirin, plavix, statin, beta-blocker; he unfortunately continues to smoke

## 2015-05-19 ENCOUNTER — Encounter: Payer: Self-pay | Admitting: Gastroenterology

## 2015-05-19 ENCOUNTER — Other Ambulatory Visit: Payer: Self-pay | Admitting: Family Medicine

## 2015-05-19 NOTE — Telephone Encounter (Signed)
He has refill on the Glipizide, but no refills on the others.

## 2015-05-20 ENCOUNTER — Other Ambulatory Visit: Payer: Self-pay

## 2015-05-27 ENCOUNTER — Ambulatory Visit: Payer: 59 | Admitting: Gastroenterology

## 2015-07-01 ENCOUNTER — Telehealth: Payer: Self-pay | Admitting: Internal Medicine

## 2015-07-01 NOTE — Telephone Encounter (Signed)
Attempted to contact patient, left message on voicemail. To [facility] for follow up Please let Dr. Stevenson Clinch know below message.. Thanks.

## 2015-07-01 NOTE — Telephone Encounter (Signed)
FYI. See message below. Thanks

## 2015-07-01 NOTE — Telephone Encounter (Signed)
Per Suanne Marker  Pt is due to have a CT Chest in Oct 2016 (6 month F/U) along with PFTs. I have attempted to contact pt multiple times (4) and left messages on pt's mobile number to return my call to schedule.  Pt has not returned any of my calls.  Just wanted to let Dr. Stevenson Clinch know.  Pt's phone number is [phone].  Thanks, Suanne Marker

## 2015-07-08 ENCOUNTER — Ambulatory Visit: Payer: 59 | Admitting: Gastroenterology

## 2015-07-21 ENCOUNTER — Other Ambulatory Visit: Payer: Self-pay | Admitting: Family Medicine

## 2015-07-21 NOTE — Telephone Encounter (Signed)
Routing to provider  

## 2015-07-21 NOTE — Telephone Encounter (Signed)
rxs approved 

## 2015-08-05 ENCOUNTER — Encounter: Payer: Self-pay | Admitting: Gastroenterology

## 2015-08-05 ENCOUNTER — Ambulatory Visit (INDEPENDENT_AMBULATORY_CARE_PROVIDER_SITE_OTHER): Payer: 59 | Admitting: Gastroenterology

## 2015-08-05 VITALS — BP 106/67 | HR 90 | Temp 99.0°F | Ht 68.0 in | Wt 193.2 lb

## 2015-08-05 DIAGNOSIS — K7460 Unspecified cirrhosis of liver: Secondary | ICD-10-CM | POA: Diagnosis not present

## 2015-08-05 DIAGNOSIS — R748 Abnormal levels of other serum enzymes: Secondary | ICD-10-CM | POA: Diagnosis not present

## 2015-08-05 NOTE — Progress Notes (Signed)
Gastroenterology Consultation  Referring Provider:     Arnetha Courser, MD Primary Care Physician:  Enid Derry, MD Primary Gastroenterologist:  Dr. Allen Norris     Reason for Consultation:     Questionable cirrhosis        HPI:   Scott Rosario is a 54 y.o. y/o male referred for consultation & management of possible cirrhosis by Dr. Enid Derry, MD.  This patient comes in with a report of possible cirrhosis. The patient had an ultrasound that showed a nodular liver back approximately 1 year ago. The patient has had blood work which showed him to have a persistently elevated alkaline phosphatase with a recent increase in his bilirubin. The patient's platelets were normal as were his other labs. She denies any history of alcohol abuse, IV drug use, homemade tattoos, or blood transfusions before 1990. The patient denies any unexplained weight loss fevers chills nausea or vomiting. The patient also denies any high risk sexual activity within IV drug user. The patient is a heavy smoker.  Past Medical History  Diagnosis Date  . Heart attack ([place])   . Asthma   . Emphysema of lung ([place])   . COPD (chronic obstructive pulmonary disease) ([place])   . Diabetes mellitus without complication ([place])   . GERD (gastroesophageal reflux disease)   . Hyperlipidemia   . Hypertension   . Tobacco use   . Pulmonary nodules     x 3, (54mm, 33mm, 86mm) chest CT February 25, 2014  . CAD (coronary artery disease)   . Cirrhosis ([place])   . Alkaline phosphatase elevation   . Aortic atherosclerosis ([place])     on CT 02/25/14  . Renal cyst     on CT 02/25/14    Past Surgical History  Procedure Laterality Date  . Coronary angioplasty with stent placement  10/25/2003    x 2  . Appendectomy      Prior to Admission medications   Medication Sig Start Date End Date Taking? Authorizing Provider  aspirin EC 81 MG tablet Take 81 mg by mouth daily.   Yes Historical Provider, MD  atorvastatin (LIPITOR) 80 MG tablet Take 80 mg by  mouth at bedtime.   Yes Historical Provider, MD  clopidogrel (PLAVIX) 75 MG tablet Take 1 tablet by mouth daily. 02/20/14  Yes Historical Provider, MD  glipiZIDE (GLUCOTROL XL) 10 MG 24 hr tablet TAKE 1 TABLET BY MOUTH ONCE DAILY. 05/19/15  Yes Arnetha Courser, MD  INVOKANA 100 MG TABS tablet TAKE 1 TABLET BY MOUTH ONCE DAILY. 05/19/15  Yes Arnetha Courser, MD  losartan (COZAAR) 50 MG tablet TAKE 1 TABLET BY MOUTH DAILY 05/19/15  Yes Arnetha Courser, MD  metoprolol succinate (TOPROL-XL) 25 MG 24 hr tablet TAKE 1 TABLET BY MOUTH EVERY DAY. 07/21/15  Yes Arnetha Courser, MD  naproxen (NAPROSYN) 500 MG tablet Take 500 mg by mouth 2 (two) times daily with a meal.   Yes Historical Provider, MD  pantoprazole (PROTONIX) 40 MG tablet TAKE 1 TABLET BY MOUTH ONCE DAILY. 07/21/15  Yes Arnetha Courser, MD  tiotropium (SPIRIVA HANDIHALER) 18 MCG inhalation capsule Place 1 capsule (18 mcg total) into inhaler and inhale daily. 12/31/14  Yes Vishal Mungal, MD  albuterol (PROAIR HFA) 108 (90 BASE) MCG/ACT inhaler Inhale 2 puffs into the lungs every 4 (four) hours as needed for wheezing or shortness of breath. Patient not taking: Reported on 05/16/2015 12/31/14   Vilinda Boehringer, MD    Family History  Problem Relation Age of Onset  . Diabetes Mother   . Hypertension Mother   . Arthritis Mother   . Hyperlipidemia Mother   . Heart attack Father   . Epilepsy Father   . Diabetes Father   . Seizures Father   . Heart disease Father   . Hyperlipidemia Father   . Hypertension Father   . Stroke Father      Social History  Substance Use Topics  . Smoking status: Current Every Day Smoker -- 1.00 packs/day for 40 years    Types: Cigarettes  . Smokeless tobacco: Never Used  . Alcohol Use: No    Allergies as of 08/05/2015  . (No Known Allergies)    Review of Systems:    All systems reviewed and negative except where noted in HPI.   Physical Exam:  BP 106/67 mmHg  Pulse 90  Temp(Src) 99 F (37.2 C) (Oral)  Ht 5\' 8"   (1.727 m)  Wt 193 lb 3.2 oz (87.635 kg)  BMI 29.38 kg/m2 No LMP for male patient. Psych:  Alert and cooperative. Normal mood and affect. General:   Alert,  Well-developed, well-nourished, pleasant and cooperative in NAD Head:  Normocephalic and atraumatic. Eyes:  Sclera clear, no icterus.   Conjunctiva pink. Ears:  Normal auditory acuity. Nose:  No deformity, discharge, or lesions. Mouth:  No deformity or lesions,oropharynx pink & moist. Neck:  Supple; no masses or thyromegaly. Lungs:  Respirations even and unlabored.  Clear throughout to auscultation.   No wheezes, crackles, or rhonchi. No acute distress. Heart:  Regular rate and rhythm; no murmurs, clicks, rubs, or gallops. Abdomen:  Normal bowel sounds.  No bruits.  Soft, non-tender and non-distended without masses, hepatosplenomegaly or hernias noted.  No guarding or rebound tenderness.  Negative Carnett sign.   Rectal:  Deferred.  Msk:  Symmetrical without gross deformities.  Good, equal movement & strength bilaterally. Pulses:  Normal pulses noted. Extremities:  No clubbing or edema.  No cyanosis. Neurologic:  Alert and oriented x3;  grossly normal neurologically. Skin:  Intact without significant lesions or rashes.  No jaundice. Lymph Nodes:  No significant cervical adenopathy. Psych:  Alert and cooperative. Normal mood and affect.  Imaging Studies: No results found.  Assessment and Plan:   Scott Rosario is a 54 y.o. y/o male who was found to have an ultrasound with a nodular liver suspicious for cirrhosis. The patient has no history of any risk factors for cirrhosis. The patient will have labs sent off for possible causes of abnormal liver enzymes with increased alkaline phosphatase with elevated bilirubin. Patient will also have a fibrosis scan sent off. The patient has been explained the plan and agrees with it.   Note: This dictation was prepared with Dragon dictation along with smaller phrase technology. Any  transcriptional errors that result from this process are unintentional.

## 2015-08-06 ENCOUNTER — Other Ambulatory Visit: Payer: Self-pay

## 2015-08-06 DIAGNOSIS — K7460 Unspecified cirrhosis of liver: Secondary | ICD-10-CM

## 2015-08-07 ENCOUNTER — Other Ambulatory Visit: Payer: Self-pay

## 2015-08-07 DIAGNOSIS — K7460 Unspecified cirrhosis of liver: Secondary | ICD-10-CM

## 2015-08-13 ENCOUNTER — Telehealth: Payer: Self-pay

## 2015-08-13 ENCOUNTER — Other Ambulatory Visit
Admission: RE | Admit: 2015-08-13 | Discharge: 2015-08-13 | Disposition: A | Discharge status: Home / Self Care | Payer: 59 | Source: Ambulatory Visit | Attending: Gastroenterology | Admitting: Gastroenterology

## 2015-08-13 DIAGNOSIS — K7460 Unspecified cirrhosis of liver: Secondary | ICD-10-CM | POA: Diagnosis not present

## 2015-08-13 LAB — HEPATIC FUNCTION PANEL
ALBUMIN: 4.4 g/dL (ref 3.5–5.0)
ALK PHOS: 145 U/L — AB (ref 38–126)
ALT: 25 U/L (ref 17–63)
AST: 23 U/L (ref 15–41)
BILIRUBIN INDIRECT: 1.1 mg/dL — AB (ref 0.3–0.9)
Bilirubin, Direct: 0.2 mg/dL (ref 0.1–0.5)
TOTAL PROTEIN: 7.3 g/dL (ref 6.5–8.1)
Total Bilirubin: 1.3 mg/dL — ABNORMAL HIGH (ref 0.3–1.2)

## 2015-08-13 LAB — PROTIME-INR
INR: 0.93
Prothrombin Time: 12.7 seconds (ref 11.4–15.0)

## 2015-08-13 NOTE — Telephone Encounter (Signed)
Pt scheduled for Abdominal US and tissue elastography at [facility] on Friday, Nov 18th @ 8:30am. Pt has been notified of date, time, location and prep instructions.

## 2015-08-14 ENCOUNTER — Telehealth: Payer: Self-pay

## 2015-08-14 LAB — CERULOPLASMIN: CERULOPLASMIN: 25.9 mg/dL (ref 16.0–31.0)

## 2015-08-14 LAB — ALPHA-1 ANTITRYPSIN PHENOTYPE: A1 ANTITRYPSIN SER: 101 mg/dL (ref 90–200)

## 2015-08-14 LAB — ANTI-SMOOTH MUSCLE ANTIBODY, IGG: F-Actin IgG: 11 Units (ref 0–19)

## 2015-08-14 LAB — HEPATITIS B SURFACE ANTIBODY, QUANTITATIVE

## 2015-08-14 LAB — HEPATITIS C ANTIBODY

## 2015-08-14 LAB — HEPATITIS A ANTIBODY, TOTAL: HEP A TOTAL AB: NEGATIVE

## 2015-08-14 LAB — HEPATITIS B SURFACE ANTIGEN: HEP B S AG: NEGATIVE

## 2015-08-14 LAB — MITOCHONDRIAL ANTIBODIES: MITOCHONDRIAL M2 AB, IGG: 9.9 U (ref 0.0–20.0)

## 2015-08-14 NOTE — Telephone Encounter (Signed)
-----   Message from Lucilla Lame, MD sent at 08/14/2015  6:56 AM EST ----- Let the patient know the Hep B was negative.

## 2015-08-14 NOTE — Telephone Encounter (Signed)
LVM for pt to return my call . Insurance has denied US/tissue elastography. Dr. Allen Norris has requested pt to have a liver biopsy. Instructs need to be given to pt.

## 2015-08-15 ENCOUNTER — Ambulatory Visit: Admission: RE | Admit: 2015-08-15 | Payer: 59 | Source: Ambulatory Visit

## 2015-08-19 ENCOUNTER — Ambulatory Visit (INDEPENDENT_AMBULATORY_CARE_PROVIDER_SITE_OTHER): Payer: 59 | Admitting: Family Medicine

## 2015-08-19 ENCOUNTER — Encounter: Payer: Self-pay | Admitting: Family Medicine

## 2015-08-19 VITALS — BP 128/82 | HR 73 | Temp 97.5°F | Wt 192.0 lb

## 2015-08-19 DIAGNOSIS — R748 Abnormal levels of other serum enzymes: Secondary | ICD-10-CM

## 2015-08-19 DIAGNOSIS — Z5181 Encounter for therapeutic drug level monitoring: Secondary | ICD-10-CM | POA: Insufficient documentation

## 2015-08-19 DIAGNOSIS — Z720 Tobacco use: Secondary | ICD-10-CM | POA: Diagnosis not present

## 2015-08-19 DIAGNOSIS — E785 Hyperlipidemia, unspecified: Secondary | ICD-10-CM

## 2015-08-19 DIAGNOSIS — E119 Type 2 diabetes mellitus without complications: Secondary | ICD-10-CM | POA: Diagnosis not present

## 2015-08-19 DIAGNOSIS — I2510 Atherosclerotic heart disease of native coronary artery without angina pectoris: Secondary | ICD-10-CM

## 2015-08-19 DIAGNOSIS — I2583 Coronary atherosclerosis due to lipid rich plaque: Secondary | ICD-10-CM

## 2015-08-19 LAB — MICROALBUMIN, URINE WAIVED
Creatinine, Urine Waived: 50 mg/dL (ref 10–300)
MICROALB, UR WAIVED: 10 mg/L (ref 0–19)
Microalb/Creat Ratio: <30 mg/g (ref <30)

## 2015-08-19 NOTE — Assessment & Plan Note (Signed)
Taking aspirin; seeing cardiologist next week; asymtpomatic

## 2015-08-19 NOTE — Assessment & Plan Note (Signed)
Seeing GI for this, imaging study pending; GI has drawn multiple tubes of blood, patient awaiting results

## 2015-08-19 NOTE — Assessment & Plan Note (Signed)
Patient is doing an excellent job with dietary modification; check fasting lipids today

## 2015-08-19 NOTE — Assessment & Plan Note (Signed)
Foot exam done by MD today; encouraged nightly checks at home; he will call for eye exams; check A1C today

## 2015-08-19 NOTE — Progress Notes (Signed)
BP 128/82 mmHg  Pulse 73  Temp(Src) 97.5 F (36.4 C)  Wt 192 lb (87.091 kg)  SpO2 99%   Subjective:    Patient ID: Daxter Stubbins, male    DOB: June 02, 1961, 54 y.o.   MRN: [id number]  HPI: Camerron Garnier is a 54 y.o. male  Chief Complaint  Patient presents with  . Diabetes    Advised patient to sch an eye exam  . Hypertension  . Hyperlipidemia   Diabetes; not checking sugars much with my blessing; he tries to watch diet; wheat bread; diet Mt Dew; sweet n' low instead of sugar; checks feet regularly; has callus on the left foot that he trims occasionally, very careful; last eye exam was over a year ago and he will call to make appt; no problems with medicines  Hypertension; controlled today; just left office; not sure about DASH guidelines  High cholesterol; cut down on the eggs and now eating Special K; he is eager to see the change in his cholesterol panel; he has also been more active and cut down on smoking (he is not ready to quit yet)  GERD; doing pretty well as long as he takes the medicine  Cirrhosis; seeing Dr. Allen Norris; they drew 7 vials of blood labs week; he wanted to do an [place] but the insurance company denied  He will be going to see Dr. Nehemiah Massed next week; no chest pain; no shortness of breath; no heart palpitations; he has been very active since he left here  Relevant past medical, surgical, family and social history reviewed and updated as indicated. Interim medical history since our last visit reviewed. Allergies and medications reviewed and updated.  Review of Systems Per HPI unless specifically indicated above     Objective:    BP 128/82 mmHg  Pulse 73  Temp(Src) 97.5 F (36.4 C)  Wt 192 lb (87.091 kg)  SpO2 99%  Wt Readings from Last 3 Encounters:  08/19/15 192 lb (87.091 kg)  08/05/15 193 lb 3.2 oz (87.635 kg)  05/16/15 190 lb (86.183 kg)    Physical Exam  Constitutional: He appears well-developed and well-nourished. No distress.  HENT:   Head: Normocephalic and atraumatic.  Eyes: EOM are normal. No scleral icterus.  Neck: No thyromegaly present.  Cardiovascular: Normal rate and regular rhythm.   Pulmonary/Chest: Effort normal and breath sounds normal.  Abdominal: Soft. Bowel sounds are normal. He exhibits no distension.  Musculoskeletal: He exhibits no edema.  Neurological: Coordination normal.  Skin: Skin is warm and dry. No pallor.  Psychiatric: He has a normal mood and affect. His behavior is normal. Judgment and thought content normal.   Diabetic Foot Form - Detailed   Diabetic Foot Exam - detailed  Diabetic Foot exam was performed with the following findings:  Yes 08/19/2015  8:36 AM  Visual Foot Exam completed.:  Yes  Are the toenails long?:  No  Are the toenails thick?:  No  Are the toenails ingrown?:  No    Pulse Foot Exam completed.:  Yes  Right Dorsalis Pedis:  Present Left Dorsalis Pedis:  Present  Sensory Foot Exam Completed.:  Yes  Swelling:  No  Semmes-Weinstein Monofilament Test  R Site 1-Great Toe:  Pos L Site 1-Great Toe:  Pos  R Site 4:  Pos L Site 4:  Pos  R Site 5:  Pos L Site 5:  Pos    Comments:  Linear callus along lateral left heel      Results for  orders placed or performed during the hospital encounter of 08/13/15  Ceruloplasmin  Result Value Ref Range   Ceruloplasmin 25.9 16.0 - 31.0 mg/dL  Hepatic function panel  Result Value Ref Range   Total Protein 7.3 6.5 - 8.1 g/dL   Albumin 4.4 3.5 - 5.0 g/dL   AST 23 15 - 41 U/L   ALT 25 17 - 63 U/L   Alkaline Phosphatase 145 (H) 38 - 126 U/L   Total Bilirubin 1.3 (H) 0.3 - 1.2 mg/dL   Bilirubin, Direct 0.2 0.1 - 0.5 mg/dL   Indirect Bilirubin 1.1 (H) 0.3 - 0.9 mg/dL  Mitochondrial antibodies  Result Value Ref Range   Mitochondrial M2 Ab, IgG 9.9 0.0 - 20.0 Units  Hepatitis B surface antigen  Result Value Ref Range   Hepatitis B Surface Ag Negative Negative  Hepatitis C antibody  Result Value Ref Range   HCV Ab <0.1 0.0 - 0.9  s/co ratio  Hepatitis A antibody, total  Result Value Ref Range   Hep A Total Ab Negative Negative  Hepatitis B surface antibody  Result Value Ref Range   Hepatitis B-Post <3.1 (L) Immunity>9.9 mIU/mL  Anti-smooth muscle antibody, IgG  Result Value Ref Range   F-Actin IgG 11 0 - 19 Units  Alpha-1 antitrypsin phenotype  Result Value Ref Range   A-1 Antitrypsin Pheno MZ    A-1 Antitrypsin, Ser 101 90 - 200 mg/dL  Protime-INR  Result Value Ref Range   Prothrombin Time 12.7 11.4 - 15.0 seconds   INR 0.93       Assessment & Plan:   Problem List Items Addressed This Visit      Cardiovascular and Mediastinum   CAD (coronary artery disease)    Taking aspirin; seeing cardiologist next week; asymtpomatic        Endocrine   Diabetes mellitus without complication ([place]) - Primary    Foot exam done by MD today; encouraged nightly checks at home; he will call for eye exams; check A1C today      Relevant Orders   Hgb A1c w/o eAG   Microalbumin, Urine Waived     Other   Tobacco abuse    He is working on thinking about quitting smoking, not quite there yet; he has cut back; encouragement given      Hyperlipidemia    Patient is doing an excellent job with dietary modification; check fasting lipids today      Relevant Orders   Lipid Panel w/o Chol/HDL Ratio   Alkaline phosphatase elevation    Seeing GI for this, imaging study pending; GI has drawn multiple tubes of blood, patient awaiting results      Medication monitoring encounter   Relevant Orders   CBC with Differential/Platelet   Basic metabolic panel      Follow up plan: Return in about 6 months (around 02/16/2016).  Orders Placed This Encounter  Procedures  . CBC with Differential/Platelet  . Hgb A1c w/o eAG  . Microalbumin, Urine Waived  . Lipid Panel w/o Chol/HDL Ratio  . Basic metabolic panel   An after-visit summary was printed and given to the patient at [facility].  Please see the patient instructions  which may contain other information and recommendations beyond what is mentioned above in the assessment and plan.

## 2015-08-19 NOTE — Patient Instructions (Addendum)
Please call [facility] to schedule your yearly diabetic eye exam Continue to follow-up with Dr. Allen Norris and Dr. Nehemiah Massed Really consider quitting smoking; it will be the best thing you can do for your health; call 1-800-QUIT-NOW for assistance if desired Check feet every night and alert me of any new problems Return in 6 months, sooner if needed  DASH Eating Plan DASH stands for "Dietary Approaches to Stop Hypertension." The DASH eating plan is a healthy eating plan that has been shown to reduce high blood pressure (hypertension). Additional health benefits may include reducing the risk of type 2 diabetes mellitus, heart disease, and stroke. The DASH eating plan may also help with weight loss. WHAT DO I NEED TO KNOW ABOUT THE DASH EATING PLAN? For the DASH eating plan, you will follow these general guidelines:  Choose foods with a percent daily value for sodium of less than 5% (as listed on the food label).  Use salt-free seasonings or herbs instead of table salt or sea salt.  Check with your health care provider or pharmacist before using salt substitutes.  Eat lower-sodium products, often labeled as "lower sodium" or "no salt added."  Eat fresh foods.  Eat more vegetables, fruits, and low-fat dairy products.  Choose whole grains. Look for the word "whole" as the first word in the ingredient list.  Choose fish and skinless chicken or [place] more often than red meat. Limit fish, poultry, and meat to 6 oz (170 g) each day.  Limit sweets, desserts, sugars, and sugary drinks.  Choose heart-healthy fats.  Limit cheese to 1 oz (28 g) per day.  Eat more home-cooked food and less restaurant, buffet, and fast food.  Limit fried foods.  Cook foods using methods other than frying.  Limit canned vegetables. If you do use them, rinse them well to decrease the sodium.  When eating at a restaurant, ask that your food be prepared with less salt, or no salt if possible. WHAT FOODS CAN  I EAT? Seek help from a dietitian for individual calorie needs. Grains Whole grain or whole wheat bread. Brown rice. Whole grain or whole wheat pasta. Quinoa, bulgur, and whole grain cereals. Low-sodium cereals. Corn or whole wheat flour tortillas. Whole grain cornbread. Whole grain crackers. Low-sodium crackers. Vegetables Fresh or frozen vegetables (raw, steamed, roasted, or grilled). Low-sodium or reduced-sodium tomato and vegetable juices. Low-sodium or reduced-sodium tomato sauce and paste. Low-sodium or reduced-sodium canned vegetables.  Fruits All fresh, canned (in natural juice), or frozen fruits. Meat and Other Protein Products Ground beef (85% or leaner), grass-fed beef, or beef trimmed of fat. Skinless chicken or [place]. Ground chicken or [place]. Pork trimmed of fat. All fish and seafood. Eggs. Dried beans, peas, or lentils. Unsalted nuts and seeds. Unsalted canned beans. Dairy Low-fat dairy products, such as skim or 1% milk, 2% or reduced-fat cheeses, low-fat ricotta or cottage cheese, or plain low-fat yogurt. Low-sodium or reduced-sodium cheeses. Fats and Oils Tub margarines without trans fats. Light or reduced-fat mayonnaise and salad dressings (reduced sodium). Avocado. Safflower, olive, or canola oils. Natural peanut or almond butter. Other Unsalted popcorn and pretzels. The items listed above may not be a complete list of recommended foods or beverages. Contact your dietitian for more options. WHAT FOODS ARE NOT RECOMMENDED? Grains White bread. White pasta. White rice. Refined cornbread. Bagels and croissants. Crackers that contain trans fat. Vegetables Creamed or fried vegetables. Vegetables in a cheese sauce. Regular canned vegetables. Regular canned tomato sauce and paste. Regular tomato and  vegetable juices. Fruits Dried fruits. Canned fruit in light or heavy syrup. Fruit juice. Meat and Other Protein Products Fatty cuts of meat. Ribs, chicken wings, bacon, sausage,  bologna, salami, chitterlings, fatback, hot dogs, bratwurst, and packaged luncheon meats. Salted nuts and seeds. Canned beans with salt. Dairy Whole or 2% milk, cream, half-and-half, and cream cheese. Whole-fat or sweetened yogurt. Full-fat cheeses or blue cheese. Nondairy creamers and whipped toppings. Processed cheese, cheese spreads, or cheese curds. Condiments Onion and garlic salt, seasoned salt, table salt, and sea salt. Canned and packaged gravies. Worcestershire sauce. Tartar sauce. Barbecue sauce. Teriyaki sauce. Soy sauce, including reduced sodium. Steak sauce. Fish sauce. Oyster sauce. Cocktail sauce. Horseradish. Ketchup and mustard. Meat flavorings and tenderizers. Bouillon cubes. Hot sauce. Tabasco sauce. Marinades. Taco seasonings. Relishes. Fats and Oils Butter, stick margarine, lard, shortening, ghee, and bacon fat. Coconut, palm kernel, or palm oils. Regular salad dressings. Other Pickles and olives. Salted popcorn and pretzels. The items listed above may not be a complete list of foods and beverages to avoid. Contact your dietitian for more information. WHERE CAN I FIND MORE INFORMATION? National Heart, Lung, and Blood Institute: travelstabloid.com   This information is not intended to replace advice given to you by your health care provider. Make sure you discuss any questions you have with your health care provider.   Document Released: 09/02/2011 Document Revised: 10/04/2014 Document Reviewed: 07/18/2013 Elsevier Interactive Patient Education 2016 [employer]. Smoking Hazards Smoking cigarettes is extremely bad for your health. Tobacco smoke has over 200 known poisons in it. It contains the poisonous gases nitrogen oxide and carbon monoxide. There are over 60 chemicals in tobacco smoke that cause cancer. Some of the chemicals found in cigarette smoke include:   Cyanide.   Benzene.   Formaldehyde.   Methanol (wood alcohol).    Acetylene (fuel used in welding torches).   Ammonia.  Even smoking lightly shortens your life expectancy by several years. You can greatly reduce the risk of medical problems for you and your family by stopping now. Smoking is the most preventable cause of death and disease in our society. Within days of quitting smoking, your circulation improves, you decrease the risk of having a heart attack, and your lung capacity improves. There may be some increased phlegm in the first few days after quitting, and it may take months for your lungs to clear up completely. Quitting for 10 years reduces your risk of developing lung cancer to almost that of a nonsmoker.  WHAT ARE THE RISKS OF SMOKING? Cigarette smokers have an increased risk of many serious medical problems, including:  Lung cancer.   Lung disease (such as pneumonia, bronchitis, and emphysema).   Heart attack and chest pain due to the heart not getting enough oxygen (angina).   Heart disease and peripheral blood vessel disease.   Hypertension.   Stroke.   Oral cancer (cancer of the lip, mouth, or voice box).   Bladder cancer.   Pancreatic cancer.   Cervical cancer.   Pregnancy complications, including premature birth.   Stillbirths and smaller newborn babies, birth defects, and genetic damage to sperm.   Early menopause.   Lower estrogen level for women.   Infertility.   Facial wrinkles.   Blindness.   Increased risk of broken bones (fractures).   Senile dementia.   Stomach ulcers and internal bleeding.   Delayed wound healing and increased risk of complications during surgery. Because of secondhand smoke exposure, children of smokers have an increased risk of  the following:   Sudden infant death syndrome (SIDS).   Respiratory infections.   Lung cancer.   Heart disease.   Ear infections.  WHY IS SMOKING ADDICTIVE? Nicotine is the chemical agent in tobacco that is capable of  causing addiction or dependence. When you smoke and inhale, nicotine is absorbed rapidly into the bloodstream through your lungs. Both inhaled and noninhaled nicotine may be addictive.  WHAT ARE THE BENEFITS OF QUITTING?  There are many health benefits to quitting smoking. Some are:   The likelihood of developing cancer and heart disease decreases. Health improvements are seen almost immediately.   Blood pressure, pulse rate, and breathing patterns start returning to normal soon after quitting.   People who quit may see an improvement in their overall quality of life.  HOW DO YOU QUIT SMOKING? Smoking is an addiction with both physical and psychological effects, and longtime habits can be hard to change. Your health care provider can recommend:  Programs and community resources, which may include group support, education, or therapy.  Replacement products, such as patches, gum, and nasal sprays. Use these products only as directed. Do not replace cigarette smoking with electronic cigarettes (commonly called e-cigarettes). The safety of e-cigarettes is unknown, and some may contain harmful chemicals. FOR MORE INFORMATION  American Lung Association: www.lung.org  American Cancer Society: www.cancer.org   This information is not intended to replace advice given to you by your health care provider. Make sure you discuss any questions you have with your health care provider.   Document Released: 10/21/2004 Document Revised: 07/04/2013 Document Reviewed: 03/05/2013 Elsevier Interactive Patient Education [employer].

## 2015-08-19 NOTE — Assessment & Plan Note (Signed)
He is working on thinking about quitting smoking, not quite there yet; he has cut back; encouragement given

## 2015-08-20 LAB — CBC WITH DIFFERENTIAL/PLATELET
BASOS ABS: 0.1 10*3/uL (ref 0.0–0.2)
BASOS: 1 %
EOS (ABSOLUTE): 0.2 10*3/uL (ref 0.0–0.4)
Eos: 2 %
Hematocrit: 46.4 % (ref 37.5–51.0)
Hemoglobin: 16 g/dL (ref 12.6–17.7)
IMMATURE GRANS (ABS): 0 10*3/uL (ref 0.0–0.1)
IMMATURE GRANULOCYTES: 0 %
LYMPHS: 18 %
Lymphocytes Absolute: 1.6 10*3/uL (ref 0.7–3.1)
MCH: 30.5 pg (ref 26.6–33.0)
MCHC: 34.5 g/dL (ref 31.5–35.7)
MCV: 89 fL (ref 79–97)
MONOS ABS: 0.8 10*3/uL (ref 0.1–0.9)
Monocytes: 9 %
NEUTROS PCT: 70 %
Neutrophils Absolute: 6.4 10*3/uL (ref 1.4–7.0)
PLATELETS: 216 10*3/uL (ref 150–379)
RBC: 5.24 x10E6/uL (ref 4.14–5.80)
RDW: 14 % (ref 12.3–15.4)
WBC: 9 10*3/uL (ref 3.4–10.8)

## 2015-08-20 LAB — LIPID PANEL W/O CHOL/HDL RATIO
CHOLESTEROL TOTAL: 119 mg/dL (ref 100–199)
HDL: 27 mg/dL — AB (ref >39)
LDL Calculated: 68 mg/dL (ref 0–99)
TRIGLYCERIDES: 121 mg/dL (ref 0–149)
VLDL Cholesterol Cal: 24 mg/dL (ref 5–40)

## 2015-08-20 LAB — HGB A1C W/O EAG: HEMOGLOBIN A1C: 7.3 % — AB (ref 4.8–5.6)

## 2015-08-20 LAB — BASIC METABOLIC PANEL
BUN / CREAT RATIO: 14 (ref 9–20)
BUN: 13 mg/dL (ref 6–24)
CO2: 23 mmol/L (ref 18–29)
CREATININE: 0.93 mg/dL (ref 0.76–1.27)
Calcium: 9.4 mg/dL (ref 8.7–10.2)
Chloride: 99 mmol/L (ref 97–106)
GFR calc Af Amer: 106 mL/min/{1.73_m2} (ref >59)
GFR, EST NON AFRICAN AMERICAN: 91 mL/min/{1.73_m2} (ref >59)
Glucose: 184 mg/dL — ABNORMAL HIGH (ref 65–99)
Potassium: 4.7 mmol/L (ref 3.5–5.2)
SODIUM: 137 mmol/L (ref 136–144)

## 2015-08-25 ENCOUNTER — Other Ambulatory Visit: Payer: Self-pay | Admitting: Family Medicine

## 2015-08-25 NOTE — Telephone Encounter (Signed)
Dr [name] patient-routing to provider. 

## 2015-09-03 ENCOUNTER — Telehealth: Payer: Self-pay

## 2015-09-03 NOTE — Telephone Encounter (Signed)
Pt notified that labs were normal except alkaline phosphatase. Advised pt per Dr. Allen Norris, he would like pt to have a liver biopsy to determine the cause of this elevation. Pt would like to discuss this with Dr. Sanda Klein and will call back if this is recommended. Pt also told me he was getting new insurance and we may can just schedule RUQ [place] and tissue elastography once this becomes active. Previous insurance denied elastography.

## 2015-09-03 NOTE — Telephone Encounter (Signed)
Pt notified of lab results

## 2015-09-03 NOTE — Telephone Encounter (Signed)
-----   Message from Lucilla Lame, MD sent at 08/14/2015  6:56 AM EST ----- Let the patient know the Hep B was negative.

## 2015-09-03 NOTE — Telephone Encounter (Signed)
-----   Message from Lucilla Lame, MD sent at 08/18/2015 12:46 PM EST ----- The patient know that all the labs were normal.

## 2015-09-23 ENCOUNTER — Other Ambulatory Visit: Payer: Self-pay | Admitting: Family Medicine

## 2015-09-24 NOTE — Telephone Encounter (Signed)
BP and pulse Nov 2016 visit reviewed; Rx approved

## 2015-12-01 ENCOUNTER — Other Ambulatory Visit: Payer: Self-pay | Admitting: Family Medicine

## 2015-12-01 NOTE — Telephone Encounter (Signed)
rx approved

## 2015-12-29 ENCOUNTER — Other Ambulatory Visit: Payer: Self-pay | Admitting: Family Medicine

## 2015-12-30 NOTE — Telephone Encounter (Signed)
Pertinent labs from Nov 2016 reviewed; upcoming appt in May; Rx approved

## 2016-01-26 ENCOUNTER — Other Ambulatory Visit: Payer: Self-pay | Admitting: Family Medicine

## 2016-02-17 ENCOUNTER — Encounter: Payer: Self-pay | Admitting: Family Medicine

## 2016-02-17 ENCOUNTER — Ambulatory Visit (INDEPENDENT_AMBULATORY_CARE_PROVIDER_SITE_OTHER): Payer: BLUE CROSS/BLUE SHIELD | Admitting: Family Medicine

## 2016-02-17 ENCOUNTER — Telehealth: Payer: Self-pay

## 2016-02-17 VITALS — BP 120/68 | HR 86 | Temp 98.4°F | Resp 20 | Ht 68.0 in | Wt 192.6 lb

## 2016-02-17 DIAGNOSIS — J449 Chronic obstructive pulmonary disease, unspecified: Secondary | ICD-10-CM | POA: Diagnosis not present

## 2016-02-17 DIAGNOSIS — I10 Essential (primary) hypertension: Secondary | ICD-10-CM

## 2016-02-17 DIAGNOSIS — I2510 Atherosclerotic heart disease of native coronary artery without angina pectoris: Secondary | ICD-10-CM | POA: Diagnosis not present

## 2016-02-17 DIAGNOSIS — R918 Other nonspecific abnormal finding of lung field: Secondary | ICD-10-CM

## 2016-02-17 DIAGNOSIS — K219 Gastro-esophageal reflux disease without esophagitis: Secondary | ICD-10-CM | POA: Diagnosis not present

## 2016-02-17 DIAGNOSIS — Z720 Tobacco use: Secondary | ICD-10-CM

## 2016-02-17 DIAGNOSIS — I2583 Coronary atherosclerosis due to lipid rich plaque: Secondary | ICD-10-CM

## 2016-02-17 DIAGNOSIS — Z5181 Encounter for therapeutic drug level monitoring: Secondary | ICD-10-CM | POA: Diagnosis not present

## 2016-02-17 DIAGNOSIS — K7469 Other cirrhosis of liver: Secondary | ICD-10-CM | POA: Diagnosis not present

## 2016-02-17 DIAGNOSIS — E785 Hyperlipidemia, unspecified: Secondary | ICD-10-CM

## 2016-02-17 DIAGNOSIS — E119 Type 2 diabetes mellitus without complications: Secondary | ICD-10-CM | POA: Diagnosis not present

## 2016-02-17 DIAGNOSIS — R748 Abnormal levels of other serum enzymes: Secondary | ICD-10-CM | POA: Diagnosis not present

## 2016-02-17 LAB — GLUCOSE, POCT (MANUAL RESULT ENTRY): POC Glucose: 206 mg/dl — AB (ref 70–99)

## 2016-02-17 LAB — POCT GLYCOSYLATED HEMOGLOBIN (HGB A1C): Hemoglobin A1C: 7.5

## 2016-02-17 NOTE — Assessment & Plan Note (Signed)
Passed stress test with Dr. Nehemiah Massed; on aspirin; encouraged him to quit smoking

## 2016-02-17 NOTE — Assessment & Plan Note (Signed)
Well-controlled; try DASH guidelines; check Cr

## 2016-02-17 NOTE — Assessment & Plan Note (Signed)
Risks of PPI discussed; skip that PPI when able, avoid triggers

## 2016-02-17 NOTE — Assessment & Plan Note (Signed)
Check labs today.

## 2016-02-17 NOTE — Patient Instructions (Addendum)
I do encourage you to quit smoking Call [phone] to sign up for smoking cessation classes You can call 1-800-QUIT-NOW to talk with a smoking cessation coach  Please do see your eye doctor regularly, and have your eyes examined every year (or more often per his or her recommendation) Check your feet every night and let me know right away of any sores, infections, numbness, etc. Try to limit sweets, white bread, white rice, white potatoes It is okay with me for you to not check your fingerstick blood sugars (per [employer] of Endocrinology Best Practices), unless you are interested and feel it would be helpful for you  Try to limit saturated fats in your diet (bologna, hot dogs, barbeque, cheeseburgers, hamburgers, steak, bacon, sausage, cheese, etc.) and get more fresh fruits, vegetables, and whole grains  DASH Eating Plan DASH stands for "Dietary Approaches to Stop Hypertension." The DASH eating plan is a healthy eating plan that has been shown to reduce high blood pressure (hypertension). Additional health benefits may include reducing the risk of type 2 diabetes mellitus, heart disease, and stroke. The DASH eating plan may also help with weight loss. WHAT DO I NEED TO KNOW ABOUT THE DASH EATING PLAN? For the DASH eating plan, you will follow these general guidelines:  Choose foods with a percent daily value for sodium of less than 5% (as listed on the food label).  Use salt-free seasonings or herbs instead of table salt or sea salt.  Check with your health care provider or pharmacist before using salt substitutes.  Eat lower-sodium products, often labeled as "lower sodium" or "no salt added."  Eat fresh foods.  Eat more vegetables, fruits, and low-fat dairy products.  Choose whole grains. Look for the word "whole" as the first word in the ingredient list.  Choose fish and skinless chicken or [place] more often than red meat. Limit fish, poultry, and meat to 6 oz (170 g) each  day.  Limit sweets, desserts, sugars, and sugary drinks.  Choose heart-healthy fats.  Limit cheese to 1 oz (28 g) per day.  Eat more home-cooked food and less restaurant, buffet, and fast food.  Limit fried foods.  Cook foods using methods other than frying.  Limit canned vegetables. If you do use them, rinse them well to decrease the sodium.  When eating at a restaurant, ask that your food be prepared with less salt, or no salt if possible. WHAT FOODS CAN I EAT? Seek help from a dietitian for individual calorie needs. Grains Whole grain or whole wheat bread. Brown rice. Whole grain or whole wheat pasta. Quinoa, bulgur, and whole grain cereals. Low-sodium cereals. Corn or whole wheat flour tortillas. Whole grain cornbread. Whole grain crackers. Low-sodium crackers. Vegetables Fresh or frozen vegetables (raw, steamed, roasted, or grilled). Low-sodium or reduced-sodium tomato and vegetable juices. Low-sodium or reduced-sodium tomato sauce and paste. Low-sodium or reduced-sodium canned vegetables.  Fruits All fresh, canned (in natural juice), or frozen fruits. Meat and Other Protein Products Ground beef (85% or leaner), grass-fed beef, or beef trimmed of fat. Skinless chicken or [place]. Ground chicken or [place]. Pork trimmed of fat. All fish and seafood. Eggs. Dried beans, peas, or lentils. Unsalted nuts and seeds. Unsalted canned beans. Dairy Low-fat dairy products, such as skim or 1% milk, 2% or reduced-fat cheeses, low-fat ricotta or cottage cheese, or plain low-fat yogurt. Low-sodium or reduced-sodium cheeses. Fats and Oils Tub margarines without trans fats. Light or reduced-fat mayonnaise and salad dressings (reduced sodium). Avocado. Safflower, olive,  or canola oils. Natural peanut or almond butter. Other Unsalted popcorn and pretzels. The items listed above may not be a complete list of recommended foods or beverages. Contact your dietitian for more options. WHAT FOODS ARE NOT  RECOMMENDED? Grains White bread. White pasta. White rice. Refined cornbread. Bagels and croissants. Crackers that contain trans fat. Vegetables Creamed or fried vegetables. Vegetables in a cheese sauce. Regular canned vegetables. Regular canned tomato sauce and paste. Regular tomato and vegetable juices. Fruits Dried fruits. Canned fruit in light or heavy syrup. Fruit juice. Meat and Other Protein Products Fatty cuts of meat. Ribs, chicken wings, bacon, sausage, bologna, salami, chitterlings, fatback, hot dogs, bratwurst, and packaged luncheon meats. Salted nuts and seeds. Canned beans with salt. Dairy Whole or 2% milk, cream, half-and-half, and cream cheese. Whole-fat or sweetened yogurt. Full-fat cheeses or blue cheese. Nondairy creamers and whipped toppings. Processed cheese, cheese spreads, or cheese curds. Condiments Onion and garlic salt, seasoned salt, table salt, and sea salt. Canned and packaged gravies. Worcestershire sauce. Tartar sauce. Barbecue sauce. Teriyaki sauce. Soy sauce, including reduced sodium. Steak sauce. Fish sauce. Oyster sauce. Cocktail sauce. Horseradish. Ketchup and mustard. Meat flavorings and tenderizers. Bouillon cubes. Hot sauce. Tabasco sauce. Marinades. Taco seasonings. Relishes. Fats and Oils Butter, stick margarine, lard, shortening, ghee, and bacon fat. Coconut, palm kernel, or palm oils. Regular salad dressings. Other Pickles and olives. Salted popcorn and pretzels. The items listed above may not be a complete list of foods and beverages to avoid. Contact your dietitian for more information. WHERE CAN I FIND MORE INFORMATION? National Heart, Lung, and Blood Institute: travelstabloid.com   This information is not intended to replace advice given to you by your health care provider. Make sure you discuss any questions you have with your health care provider.   Document Released: 09/02/2011 Document Revised: 10/04/2014  Document Reviewed: 07/18/2013 Elsevier Interactive Patient Education 2016 [employer]. Smoking Cessation, Tips for Success If you are ready to quit smoking, congratulations! You have chosen to help yourself be healthier. Cigarettes bring nicotine, tar, carbon monoxide, and other irritants into your body. Your lungs, heart, and blood vessels will be able to work better without these poisons. There are many different ways to quit smoking. Nicotine gum, nicotine patches, a nicotine inhaler, or nicotine nasal spray can help with physical craving. Hypnosis, support groups, and medicines help break the habit of smoking. WHAT THINGS CAN I DO TO MAKE QUITTING EASIER?  Here are some tips to help you quit for good:  Pick a date when you will quit smoking completely. Tell all of your friends and family about your plan to quit on that date.  Do not try to slowly cut down on the number of cigarettes you are smoking. Pick a quit date and quit smoking completely starting on that day.  Throw away all cigarettes.   Clean and remove all ashtrays from your home, work, and car.  On a card, write down your reasons for quitting. Carry the card with you and read it when you get the urge to smoke.  Cleanse your body of nicotine. Drink enough water and fluids to keep your urine clear or pale yellow. Do this after quitting to flush the nicotine from your body.  Learn to predict your moods. Do not let a bad situation be your excuse to have a cigarette. Some situations in your life might tempt you into wanting a cigarette.  Never have "just one" cigarette. It leads to wanting another and another. Remind  yourself of your decision to quit.  Change habits associated with smoking. If you smoked while driving or when feeling stressed, try other activities to replace smoking. Stand up when drinking your coffee. Brush your teeth after eating. Sit in a different chair when you read the paper. Avoid alcohol while trying to  quit, and try to drink fewer caffeinated beverages. Alcohol and caffeine may urge you to smoke.  Avoid foods and drinks that can trigger a desire to smoke, such as sugary or spicy foods and alcohol.  Ask people who smoke not to smoke around you.  Have something planned to do right after eating or having a cup of coffee. For example, plan to take a walk or exercise.  Try a relaxation exercise to calm you down and decrease your stress. Remember, you may be tense and nervous for the first 2 weeks after you quit, but this will pass.  Find new activities to keep your hands busy. Play with a pen, coin, or rubber band. Doodle or draw things on paper.  Brush your teeth right after eating. This will help cut down on the craving for the taste of tobacco after meals. You can also try mouthwash.   Use oral substitutes in place of cigarettes. Try using lemon drops, carrots, cinnamon sticks, or chewing gum. Keep them handy so they are available when you have the urge to smoke.  When you have the urge to smoke, try deep breathing.  Designate your home as a nonsmoking area.  If you are a heavy smoker, ask your health care provider about a prescription for nicotine chewing gum. It can ease your withdrawal from nicotine.  Reward yourself. Set aside the cigarette money you save and buy yourself something nice.  Look for support from others. Join a support group or smoking cessation program. Ask someone at home or at work to help you with your plan to quit smoking.  Always ask yourself, "Do I need this cigarette or is this just a reflex?" Tell yourself, "Today, I choose not to smoke," or "I do not want to smoke." You are reminding yourself of your decision to quit.  Do not replace cigarette smoking with electronic cigarettes (commonly called e-cigarettes). The safety of e-cigarettes is unknown, and some may contain harmful chemicals.  If you relapse, do not give up! Plan ahead and think about what you  will do the next time you get the urge to smoke. HOW WILL I FEEL WHEN I QUIT SMOKING? You may have symptoms of withdrawal because your body is used to nicotine (the addictive substance in cigarettes). You may crave cigarettes, be irritable, feel very hungry, cough often, get headaches, or have difficulty concentrating. The withdrawal symptoms are only temporary. They are strongest when you first quit but will go away within 10-14 days. When withdrawal symptoms occur, stay in control. Think about your reasons for quitting. Remind yourself that these are signs that your body is healing and getting used to being without cigarettes. Remember that withdrawal symptoms are easier to treat than the major diseases that smoking can cause.  Even after the withdrawal is over, expect periodic urges to smoke. However, these cravings are generally short lived and will go away whether you smoke or not. Do not smoke! WHAT RESOURCES ARE AVAILABLE TO HELP ME QUIT SMOKING? Your health care provider can direct you to community resources or hospitals for support, which may include:  Group support.  Education.  Hypnosis.  Therapy.   This information is  not intended to replace advice given to you by your health care provider. Make sure you discuss any questions you have with your health care provider.   Document Released: 06/11/2004 Document Revised: 10/04/2014 Document Reviewed: 03/01/2013 Elsevier Interactive Patient Education [employer].

## 2016-02-17 NOTE — Progress Notes (Signed)
BP 120/68 mmHg  Pulse 86  Temp(Src) 98.4 F (36.9 C)  Resp 20  Ht 5\' 8"  (1.727 m)  Wt 192 lb 9 oz (87.346 kg)  BMI 29.29 kg/m2  SpO2 96%   Subjective:    Patient ID: Scott Rosario, male    DOB: [birth year], 55 y.o.   MRN: [id number]  HPI: Scott Rosario is a 55 y.o. male  Chief Complaint  Patient presents with  . Diabetes     6 month follow up   He has been doing well since last visit No medical excitement; no flu, no ER visits  Diabetes; he feels like it doing well; FSBS this morning 206 fasting; usually 135-146, not running bad; no sores on the feet  Under some stress, selling his house and building another; pulling things together  CAD; noted on scan; he saw Dr. Nehemiah Massed and had stress test and passed; on aspirin  He has been to Dr. Allen Norris about his liver; small spot and nothing to worry about; suggested just coming back in a year  Smoking; not ready to quit; 1 ppd, or a little more on bad days  High cholesterol; cut out a lot of the fats  COPD; has spiriva, not using it  GERD; on PPI  Relevant past medical, surgical, family and social history reviewed Past Medical History  Diagnosis Date  . Heart attack ([place])   . Asthma   . Emphysema of lung ([place])   . COPD (chronic obstructive pulmonary disease) ([place])   . Diabetes mellitus without complication ([place])   . GERD (gastroesophageal reflux disease)   . Hyperlipidemia   . Hypertension   . Tobacco use   . Pulmonary nodules     x 3, (79mm, 16mm, 46mm) chest CT February 25, 2014  . CAD (coronary artery disease)   . Cirrhosis ([place])   . Alkaline phosphatase elevation   . Aortic atherosclerosis ([place])     on CT 02/25/14  . Renal cyst     on CT 02/25/14   Past Surgical History  Procedure Laterality Date  . Coronary angioplasty with stent placement  10/25/2003    x 2  . Appendectomy     Family History  Problem Relation Age of Onset  . Diabetes Mother   . Hypertension Mother   . Arthritis Mother   .  Hyperlipidemia Mother   . Heart attack Father   . Epilepsy Father   . Diabetes Father   . Seizures Father   . Heart disease Father   . Hyperlipidemia Father   . Hypertension Father   . Stroke Father    Social History  Substance Use Topics  . Smoking status: Current Every Day Smoker -- 1.00 packs/day for 40 years    Types: Cigarettes  . Smokeless tobacco: Never Used  . Alcohol Use: No   Interim medical history since last visit reviewed. Allergies and medications reviewed  Review of Systems Per HPI unless specifically indicated above     Objective:    BP 120/68 mmHg  Pulse 86  Temp(Src) 98.4 F (36.9 C)  Resp 20  Ht 5\' 8"  (1.727 m)  Wt 192 lb 9 oz (87.346 kg)  BMI 29.29 kg/m2  SpO2 96%  Wt Readings from Last 3 Encounters:  02/17/16 192 lb 9 oz (87.346 kg)  08/19/15 192 lb (87.091 kg)  08/05/15 193 lb 3.2 oz (87.635 kg)    Physical Exam  Constitutional: He appears well-developed and well-nourished. No distress.  HENT:  Head: Normocephalic and atraumatic.  Eyes: EOM are normal. No scleral icterus.  Neck: No thyromegaly present.  Cardiovascular: Normal rate and regular rhythm.   Pulmonary/Chest: Effort normal and breath sounds normal.  Abdominal: Soft. Bowel sounds are normal. He exhibits no distension.  Musculoskeletal: He exhibits no edema.  Neurological: Coordination normal.  Skin: Skin is warm and dry. No pallor.  Psychiatric: He has a normal mood and affect. His behavior is normal. Judgment and thought content normal.   Diabetic Foot Form - Detailed   Diabetic Foot Exam - detailed  Diabetic Foot exam was performed with the following findings:  Yes 02/17/2016  9:24 AM  Visual Foot Exam completed.:  Yes  Are the toenails long?:  No  Are the toenails thick?:  No  Are the toenails ingrown?:  No  Normal Range of Motion:  Yes    Pulse Foot Exam completed.:  Yes  Right Dorsalis Pedis:  Present Left Dorsalis Pedis:  Present  Sensory Foot Exam Completed.:  Yes    Swelling:  No  Semmes-Weinstein Monofilament Test  R Site 1-Great Toe:  Pos L Site 1-Great Toe:  Pos  R Site 4:  Pos L Site 4:  Pos  R Site 5:  Pos L Site 5:  Pos        Results for orders placed or performed in visit on 02/17/16  POCT Glucose (CBG)  Result Value Ref Range   POC Glucose 206 (A) 70 - 99 mg/dl  POCT HgB A1C  Result Value Ref Range   Hemoglobin A1C 7.5       Assessment & Plan:   Problem List Items Addressed This Visit      Cardiovascular and Mediastinum   CAD (coronary artery disease)    Passed stress test with Dr. Nehemiah Massed; on aspirin; encouraged him to quit smoking      Relevant Orders   Lipid Panel w/o Chol/HDL Ratio   Hypertension    Well-controlled; try DASH guidelines; check Cr        Respiratory   COPD (chronic obstructive pulmonary disease) (HCC)    Has spiriva and albuterol; encouraged to quit smoking        Digestive   Cirrhosis ([place])    Has seen Dr. Allen Norris (GI) and will go back this fall sometime; avoid anything harmful to liver      GERD (gastroesophageal reflux disease)    Risks of PPI discussed; skip that PPI when able, avoid triggers        Endocrine   Diabetes mellitus without complication ([place]) - Primary    .      Relevant Orders   POCT Glucose (CBG) (Completed)   POCT HgB A1C (Completed)   Hemoglobin A1c   Microalbumin / creatinine urine ratio     Other   Alkaline phosphatase elevation    Saw Dr. Allen Norris, he gave him a pass for one year per patient      Hyperlipidemia    Check lipids; limit saturated fats      Relevant Orders   Lipid Panel w/o Chol/HDL Ratio   Medication monitoring encounter    Check labs today      Relevant Orders   CBC with Differential/Platelet   Comprehensive metabolic panel   Pulmonary nodules    Order follow-up chest CT      Relevant Orders   CT CHEST NODULE FOLLOW UP LOW DOSE W/O   Tobacco use    Encouraged him to quit, he is not ready; see  AVS         Follow up  plan: Return in about 6 months (around 08/19/2016) for fasting labs and visit.  An after-visit summary was printed and given to the patient at [facility].  Please see the patient instructions which may contain other information and recommendations beyond what is mentioned above in the assessment and plan.  Orders Placed This Encounter  Procedures  . CT CHEST NODULE FOLLOW UP LOW DOSE W/O  . Hemoglobin A1c  . CBC with Differential/Platelet  . Comprehensive metabolic panel  . Lipid Panel w/o Chol/HDL Ratio  . Microalbumin / creatinine urine ratio  . POCT Glucose (CBG)  . POCT HgB A1C

## 2016-02-17 NOTE — Assessment & Plan Note (Signed)
Saw Dr. Allen Norris, he gave him a pass for one year per patient

## 2016-02-17 NOTE — Assessment & Plan Note (Signed)
Check lipids; limit saturated fats 

## 2016-02-17 NOTE — Assessment & Plan Note (Signed)
Encouraged him to quit, he is not ready; see AVS

## 2016-02-17 NOTE — Assessment & Plan Note (Signed)
Order follow-up chest CT

## 2016-02-17 NOTE — Telephone Encounter (Signed)
New order entered; please cancel the other; thank you

## 2016-02-17 NOTE — Assessment & Plan Note (Signed)
Has seen Dr. Allen Norris (GI) and will go back this fall sometime; avoid anything harmful to liver

## 2016-02-17 NOTE — Assessment & Plan Note (Signed)
Chest CT 

## 2016-02-17 NOTE — Assessment & Plan Note (Signed)
Has spiriva and albuterol; encouraged to quit smoking

## 2016-02-17 NOTE — Telephone Encounter (Signed)
When I called to schedule CT they state was ordered wrong it just needs to be a CT chest w/o contrast

## 2016-02-18 LAB — COMPREHENSIVE METABOLIC PANEL
ALBUMIN: 4.7 g/dL (ref 3.5–5.5)
ALT: 28 IU/L (ref 0–44)
AST: 19 IU/L (ref 0–40)
Albumin/Globulin Ratio: 2.2 (ref 1.2–2.2)
Alkaline Phosphatase: 151 IU/L — ABNORMAL HIGH (ref 39–117)
BILIRUBIN TOTAL: 1.6 mg/dL — AB (ref 0.0–1.2)
BUN / CREAT RATIO: 16 (ref 9–20)
BUN: 14 mg/dL (ref 6–24)
CALCIUM: 9.3 mg/dL (ref 8.7–10.2)
CHLORIDE: 99 mmol/L (ref 96–106)
CO2: 22 mmol/L (ref 18–29)
CREATININE: 0.89 mg/dL (ref 0.76–1.27)
GFR, EST AFRICAN AMERICAN: 110 mL/min/{1.73_m2} (ref >59)
GFR, EST NON AFRICAN AMERICAN: 96 mL/min/{1.73_m2} (ref >59)
GLUCOSE: 167 mg/dL — AB (ref 65–99)
Globulin, Total: 2.1 g/dL (ref 1.5–4.5)
Potassium: 4.7 mmol/L (ref 3.5–5.2)
Sodium: 138 mmol/L (ref 134–144)
TOTAL PROTEIN: 6.8 g/dL (ref 6.0–8.5)

## 2016-02-18 LAB — MICROALBUMIN / CREATININE URINE RATIO
Creatinine, Urine: 32.2 mg/dL
MICROALB/CREAT RATIO: <9.3 mg/g creat (ref 0.0–30.0)

## 2016-02-18 LAB — CBC WITH DIFFERENTIAL/PLATELET
BASOS ABS: 0.1 10*3/uL (ref 0.0–0.2)
Basos: 0 %
EOS (ABSOLUTE): 0.2 10*3/uL (ref 0.0–0.4)
Eos: 2 %
HEMOGLOBIN: 16 g/dL (ref 12.6–17.7)
Hematocrit: 47.2 % (ref 37.5–51.0)
IMMATURE GRANS (ABS): 0 10*3/uL (ref 0.0–0.1)
IMMATURE GRANULOCYTES: 0 %
LYMPHS: 15 %
Lymphocytes Absolute: 1.8 10*3/uL (ref 0.7–3.1)
MCH: 29.7 pg (ref 26.6–33.0)
MCHC: 33.9 g/dL (ref 31.5–35.7)
MCV: 88 fL (ref 79–97)
MONOCYTES: 7 %
Monocytes Absolute: 0.8 10*3/uL (ref 0.1–0.9)
NEUTROS ABS: 8.8 10*3/uL — AB (ref 1.4–7.0)
NEUTROS PCT: 76 %
PLATELETS: 220 10*3/uL (ref 150–379)
RBC: 5.39 x10E6/uL (ref 4.14–5.80)
RDW: 13.9 % (ref 12.3–15.4)
WBC: 11.6 10*3/uL — ABNORMAL HIGH (ref 3.4–10.8)

## 2016-02-18 LAB — LIPID PANEL W/O CHOL/HDL RATIO
Cholesterol, Total: 124 mg/dL (ref 100–199)
HDL: 31 mg/dL — AB (ref >39)
LDL CALC: 64 mg/dL (ref 0–99)
Triglycerides: 145 mg/dL (ref 0–149)
VLDL CHOLESTEROL CAL: 29 mg/dL (ref 5–40)

## 2016-02-18 LAB — HEMOGLOBIN A1C
ESTIMATED AVERAGE GLUCOSE: 183 mg/dL
HEMOGLOBIN A1C: 8 % — AB (ref 4.8–5.6)

## 2016-02-25 ENCOUNTER — Other Ambulatory Visit: Payer: Self-pay | Admitting: Family Medicine

## 2016-02-25 ENCOUNTER — Other Ambulatory Visit: Payer: Self-pay

## 2016-02-25 DIAGNOSIS — D72829 Elevated white blood cell count, unspecified: Secondary | ICD-10-CM

## 2016-02-25 MED ORDER — CANAGLIFLOZIN 300 MG PO TABS: 300.0000 mg | ORAL_TABLET | Freq: Every day | ORAL | Status: DC

## 2016-03-01 ENCOUNTER — Other Ambulatory Visit: Payer: Self-pay | Admitting: Family Medicine

## 2016-03-01 NOTE — Telephone Encounter (Signed)
Cr and K+ are normal; refill of ARB approved I denied the invokana 100; this was increased to 300 mg strength and he should not take both

## 2016-03-05 ENCOUNTER — Emergency Department: Payer: BLUE CROSS/BLUE SHIELD

## 2016-03-05 ENCOUNTER — Other Ambulatory Visit: Payer: Self-pay

## 2016-03-05 ENCOUNTER — Observation Stay
Admission: EM | Admit: 2016-03-05 | Discharge: 2016-03-06 | Disposition: A | Discharge status: Home / Self Care | Payer: BLUE CROSS/BLUE SHIELD | Attending: Internal Medicine | Admitting: Internal Medicine

## 2016-03-05 ENCOUNTER — Ambulatory Visit
Admission: RE | Admit: 2016-03-05 | Discharge: 2016-03-05 | Disposition: A | Discharge status: Home / Self Care | Payer: BLUE CROSS/BLUE SHIELD | Source: Ambulatory Visit | Attending: Family Medicine | Admitting: Family Medicine

## 2016-03-05 DIAGNOSIS — I252 Old myocardial infarction: Secondary | ICD-10-CM | POA: Insufficient documentation

## 2016-03-05 DIAGNOSIS — F17210 Nicotine dependence, cigarettes, uncomplicated: Secondary | ICD-10-CM | POA: Insufficient documentation

## 2016-03-05 DIAGNOSIS — R079 Chest pain, unspecified: Secondary | ICD-10-CM

## 2016-03-05 DIAGNOSIS — R918 Other nonspecific abnormal finding of lung field: Secondary | ICD-10-CM | POA: Diagnosis present

## 2016-03-05 DIAGNOSIS — R0789 Other chest pain: Secondary | ICD-10-CM | POA: Diagnosis not present

## 2016-03-05 DIAGNOSIS — E1165 Type 2 diabetes mellitus with hyperglycemia: Secondary | ICD-10-CM

## 2016-03-05 DIAGNOSIS — E1169 Type 2 diabetes mellitus with other specified complication: Secondary | ICD-10-CM

## 2016-03-05 DIAGNOSIS — J432 Centrilobular emphysema: Secondary | ICD-10-CM | POA: Diagnosis present

## 2016-03-05 DIAGNOSIS — I10 Essential (primary) hypertension: Secondary | ICD-10-CM | POA: Diagnosis present

## 2016-03-05 DIAGNOSIS — K219 Gastro-esophageal reflux disease without esophagitis: Secondary | ICD-10-CM | POA: Diagnosis not present

## 2016-03-05 DIAGNOSIS — E119 Type 2 diabetes mellitus without complications: Secondary | ICD-10-CM | POA: Diagnosis not present

## 2016-03-05 DIAGNOSIS — Z955 Presence of coronary angioplasty implant and graft: Secondary | ICD-10-CM | POA: Diagnosis not present

## 2016-03-05 DIAGNOSIS — Z7902 Long term (current) use of antithrombotics/antiplatelets: Secondary | ICD-10-CM | POA: Insufficient documentation

## 2016-03-05 DIAGNOSIS — I2510 Atherosclerotic heart disease of native coronary artery without angina pectoris: Secondary | ICD-10-CM | POA: Diagnosis not present

## 2016-03-05 DIAGNOSIS — Z9049 Acquired absence of other specified parts of digestive tract: Secondary | ICD-10-CM | POA: Insufficient documentation

## 2016-03-05 DIAGNOSIS — E785 Hyperlipidemia, unspecified: Secondary | ICD-10-CM | POA: Diagnosis not present

## 2016-03-05 DIAGNOSIS — Z7982 Long term (current) use of aspirin: Secondary | ICD-10-CM | POA: Diagnosis not present

## 2016-03-05 DIAGNOSIS — I249 Acute ischemic heart disease, unspecified: Secondary | ICD-10-CM | POA: Insufficient documentation

## 2016-03-05 DIAGNOSIS — K7460 Unspecified cirrhosis of liver: Secondary | ICD-10-CM | POA: Diagnosis not present

## 2016-03-05 DIAGNOSIS — J449 Chronic obstructive pulmonary disease, unspecified: Secondary | ICD-10-CM | POA: Diagnosis not present

## 2016-03-05 DIAGNOSIS — Z720 Tobacco use: Secondary | ICD-10-CM | POA: Diagnosis present

## 2016-03-05 DIAGNOSIS — IMO0002 Reserved for concepts with insufficient information to code with codable children: Secondary | ICD-10-CM

## 2016-03-05 LAB — CBC
HEMATOCRIT: 43.6 % (ref 40.0–52.0)
HEMOGLOBIN: 15 g/dL (ref 13.0–18.0)
MCH: 30 pg (ref 26.0–34.0)
MCHC: 34.5 g/dL (ref 32.0–36.0)
MCV: 87 fL (ref 80.0–100.0)
Platelets: 182 10*3/uL (ref 150–440)
RBC: 5.01 MIL/uL (ref 4.40–5.90)
RDW: 13.2 % (ref 11.5–14.5)
WBC: 9.9 10*3/uL (ref 3.8–10.6)

## 2016-03-05 LAB — BASIC METABOLIC PANEL
ANION GAP: 11 (ref 5–15)
BUN: 15 mg/dL (ref 6–20)
CALCIUM: 8.4 mg/dL — AB (ref 8.9–10.3)
CO2: 21 mmol/L — ABNORMAL LOW (ref 22–32)
Chloride: 104 mmol/L (ref 101–111)
Creatinine, Ser: 0.78 mg/dL (ref 0.61–1.24)
Glucose, Bld: 108 mg/dL — ABNORMAL HIGH (ref 65–99)
POTASSIUM: 3.6 mmol/L (ref 3.5–5.1)
Sodium: 136 mmol/L (ref 135–145)

## 2016-03-05 LAB — GLUCOSE, CAPILLARY: Glucose-Capillary: 136 mg/dL — ABNORMAL HIGH (ref 65–99)

## 2016-03-05 LAB — TROPONIN I

## 2016-03-05 MED ORDER — SODIUM CHLORIDE 0.9% FLUSH
3.0000 mL | Freq: Two times a day (BID) | INTRAVENOUS | Status: DC
  Administered 2016-03-05 – 2016-03-06 (×2): 3 mL via INTRAVENOUS

## 2016-03-05 MED ORDER — METOPROLOL TARTRATE 5 MG/5ML IV SOLN: 5.0000 mg | INTRAVENOUS | Status: DC | PRN

## 2016-03-05 MED ORDER — CANAGLIFLOZIN 100 MG PO TABS
100.0000 mg | ORAL_TABLET | Freq: Every day | ORAL | Status: DC
  Filled 2016-03-05 (×2): qty 1

## 2016-03-05 MED ORDER — ATORVASTATIN CALCIUM 20 MG PO TABS
80.0000 mg | ORAL_TABLET | Freq: Every day | ORAL | Status: DC
  Administered 2016-03-05: 80 mg via ORAL
  Filled 2016-03-05: qty 4

## 2016-03-05 MED ORDER — ACETAMINOPHEN 325 MG PO TABS
650.0000 mg | ORAL_TABLET | Freq: Four times a day (QID) | ORAL | Status: DC | PRN
  Administered 2016-03-05 – 2016-03-06 (×2): 650 mg via ORAL
  Filled 2016-03-05 (×2): qty 2

## 2016-03-05 MED ORDER — ONDANSETRON HCL 4 MG PO TABS: 4.0000 mg | ORAL_TABLET | Freq: Four times a day (QID) | ORAL | Status: DC | PRN

## 2016-03-05 MED ORDER — GLIPIZIDE ER 10 MG PO TB24
10.0000 mg | ORAL_TABLET | Freq: Every day | ORAL | Status: DC
  Administered 2016-03-06: 10 mg via ORAL
  Filled 2016-03-05: qty 1

## 2016-03-05 MED ORDER — ONDANSETRON HCL 4 MG/2ML IJ SOLN: 4.0000 mg | Freq: Four times a day (QID) | INTRAMUSCULAR | Status: DC | PRN

## 2016-03-05 MED ORDER — IPRATROPIUM-ALBUTEROL 0.5-2.5 (3) MG/3ML IN SOLN: 3.0000 mL | RESPIRATORY_TRACT | Status: DC | PRN

## 2016-03-05 MED ORDER — PANTOPRAZOLE SODIUM 40 MG PO TBEC
40.0000 mg | DELAYED_RELEASE_TABLET | Freq: Every day | ORAL | Status: DC
  Administered 2016-03-06: 40 mg via ORAL
  Filled 2016-03-05: qty 1

## 2016-03-05 MED ORDER — ASPIRIN 325 MG PO TABS
325.0000 mg | ORAL_TABLET | Freq: Every day | ORAL | Status: DC
  Administered 2016-03-06: 325 mg via ORAL
  Filled 2016-03-05: qty 1

## 2016-03-05 MED ORDER — CLOPIDOGREL BISULFATE 75 MG PO TABS
75.0000 mg | ORAL_TABLET | Freq: Every day | ORAL | Status: DC
  Administered 2016-03-06: 75 mg via ORAL
  Filled 2016-03-05: qty 1

## 2016-03-05 MED ORDER — SENNOSIDES-DOCUSATE SODIUM 8.6-50 MG PO TABS: 1.0000 | ORAL_TABLET | Freq: Every evening | ORAL | Status: DC | PRN

## 2016-03-05 MED ORDER — ACETAMINOPHEN 650 MG RE SUPP: 650.0000 mg | Freq: Four times a day (QID) | RECTAL | Status: DC | PRN

## 2016-03-05 MED ORDER — METOPROLOL SUCCINATE ER 25 MG PO TB24
25.0000 mg | ORAL_TABLET | Freq: Every day | ORAL | Status: DC
  Administered 2016-03-06: 25 mg via ORAL
  Filled 2016-03-05: qty 1

## 2016-03-05 MED ORDER — INSULIN ASPART 100 UNIT/ML ~~LOC~~ SOLN: 0.0000 [IU] | Freq: Three times a day (TID) | SUBCUTANEOUS | Status: DC

## 2016-03-05 MED ORDER — NITROGLYCERIN 0.4 MG SL SUBL: 0.4000 mg | SUBLINGUAL_TABLET | SUBLINGUAL | Status: DC | PRN

## 2016-03-05 MED ORDER — NITROGLYCERIN 2 % TD OINT
0.5000 [in_us] | TOPICAL_OINTMENT | TRANSDERMAL | Status: AC
  Administered 2016-03-05: 0.5 [in_us] via TOPICAL
  Filled 2016-03-05: qty 1

## 2016-03-05 MED ORDER — NICOTINE 14 MG/24HR TD PT24: 14.0000 mg | MEDICATED_PATCH | Freq: Every day | TRANSDERMAL | Status: DC

## 2016-03-05 MED ORDER — ENOXAPARIN SODIUM 40 MG/0.4ML ~~LOC~~ SOLN
40.0000 mg | Freq: Every day | SUBCUTANEOUS | Status: DC
  Administered 2016-03-05: 40 mg via SUBCUTANEOUS
  Filled 2016-03-05: qty 0.4

## 2016-03-05 MED ORDER — LOSARTAN POTASSIUM 50 MG PO TABS
50.0000 mg | ORAL_TABLET | Freq: Every day | ORAL | Status: DC
  Administered 2016-03-06: 50 mg via ORAL
  Filled 2016-03-05: qty 1

## 2016-03-05 MED ORDER — INSULIN ASPART 100 UNIT/ML ~~LOC~~ SOLN: 0.0000 [IU] | Freq: Every day | SUBCUTANEOUS | Status: DC

## 2016-03-05 NOTE — ED Notes (Signed)
Pt denies CP at this time 

## 2016-03-05 NOTE — H&P (Signed)
PCP:   Enid Derry, MD  Cardiologist: Dr Tally Due  Chief Complaint:  Chest pains  HPI: This is a 55 year old gentleman with a history of coronary artery disease. He had a MI approximately 10 years ago. This gentleman is building a house and has been physically involved. He started developing left-sided chest pains 1-2 weeks ago. Today he states he moved 25 bales of hay and moved 80 pounds of fertilizer, he later went on to develop severe left-sided chest pains that radiated down his left arm. At it's worse it was 10/10, currently 0/10. He took 4 aspirins and call 911. He denies any lightheadedness or palpitation. He describes this chest pain as sharp and lasted for approximately 2 hours. He states the pain felt as though he had 100 pounds sitting on his chest. He is compliant with his home medications. He does not usually have chest pains. His last stress test was 3 months ago at Dr. Marylynn Pearson office. It was a routine stress test, not done because of chest pains. When the patient was initially picked up by ambulance his blood pressure was 180/110. History provided by the patient and his many family members at bedside.  Review of Systems:  The patient denies anorexia, fever, weight loss,, vision loss, decreased hearing, hoarseness, chest pain, syncope, dyspnea on exertion, peripheral edema, balance deficits, hemoptysis, abdominal pain, melena, hematochezia, severe indigestion/heartburn, hematuria, incontinence, genital sores, muscle weakness, suspicious skin lesions, transient blindness, difficulty walking, depression, unusual weight change, abnormal bleeding, enlarged lymph nodes, angioedema, and breast masses.  Past Medical History: Past Medical History  Diagnosis Date  . Heart attack ([place])   . Asthma   . Emphysema of lung ([place])   . COPD (chronic obstructive pulmonary disease) ([place])   . Diabetes mellitus without complication ([place])   . GERD (gastroesophageal reflux disease)   . Hyperlipidemia    . Hypertension   . Tobacco use   . Pulmonary nodules     x 3, (16mm, 26mm, 66mm) chest CT February 25, 2014  . CAD (coronary artery disease)   . Cirrhosis ([place])   . Alkaline phosphatase elevation   . Aortic atherosclerosis ([place])     on CT 02/25/14  . Renal cyst     on CT 02/25/14   Past Surgical History  Procedure Laterality Date  . Coronary angioplasty with stent placement  10/25/2003    x 2  . Appendectomy      Medications: Prior to Admission medications   Medication Sig Start Date End Date Taking? Authorizing Provider  albuterol (PROAIR HFA) 108 (90 BASE) MCG/ACT inhaler Inhale 2 puffs into the lungs every 4 (four) hours as needed for wheezing or shortness of breath. 12/31/14  Yes Vishal Mungal, MD  aspirin EC 81 MG tablet Take 81 mg by mouth daily.   Yes Historical Provider, MD  atorvastatin (LIPITOR) 80 MG tablet Take 80 mg by mouth at bedtime.   Yes Historical Provider, MD  canagliflozin (INVOKANA) 100 MG TABS tablet Take 100 mg by mouth daily before breakfast.   Yes Historical Provider, MD  clopidogrel (PLAVIX) 75 MG tablet Take 75 mg by mouth daily.    Yes Historical Provider, MD  glipiZIDE (GLUCOTROL XL) 10 MG 24 hr tablet Take 10 mg by mouth daily with breakfast.   Yes Historical Provider, MD  losartan (COZAAR) 50 MG tablet Take 50 mg by mouth daily.   Yes Historical Provider, MD  metoprolol succinate (TOPROL-XL) 25 MG 24 hr tablet Take 25 mg by mouth daily.  Yes Historical Provider, MD  pantoprazole (PROTONIX) 40 MG tablet Take 40 mg by mouth daily.   Yes Historical Provider, MD    Allergies:  No Known Allergies  Social History:  reports that he has been smoking Cigarettes.  He has a 40 pack-year smoking history. He has never used smokeless tobacco. He reports that he does not drink alcohol or use illicit drugs.  Family History: Family History  Problem Relation Age of Onset  . Diabetes Mother   . Hypertension Mother   . Arthritis Mother   . Hyperlipidemia Mother   .  Heart attack Father   . Epilepsy Father   . Diabetes Father   . Seizures Father   . Heart disease Father   . Hyperlipidemia Father   . Hypertension Father   . Stroke Father     Physical Exam: Filed Vitals:   03/05/16 1858 03/05/16 1900 03/05/16 1904 03/05/16 1930  BP:  159/96 159/96 146/87  Pulse:  140 75 73  Temp:   98.3 F (36.8 C)   TempSrc:   Oral   Resp:  9 15 16   Height:   5\' 8"  (1.727 m)   Weight:   89.631 kg (197 lb 9.6 oz)   SpO2: 100% 90% 97% 94%    General:  Alert and oriented times three, well developed and nourished, no acute distress Eyes: PERRLA, pink conjunctiva, no scleral icterus ENT: Moist oral mucosa, neck supple, no thyromegaly Lungs: clear to ascultation, no wheeze, no crackles, no use of accessory muscles Cardiovascular: regular rate and rhythm, no regurgitation, no gallops, no murmurs. No carotid bruits, no JVD Abdomen: soft, positive BS, non-tender, non-distended, no organomegaly, not an acute abdomen GU: not examined Neuro: CN II - XII grossly intact, sensation intact Musculoskeletal: strength 5/5 all extremities, no clubbing, cyanosis or edema Skin: no rash, no subcutaneous crepitation, no decubitus Psych: appropriate patient   Labs on Admission:   Recent Labs  03/05/16 1913  NA 136  K 3.6  CL 104  CO2 21*  GLUCOSE 108*  BUN 15  CREATININE 0.78  CALCIUM 8.4*   No results for input(s): AST, ALT, ALKPHOS, BILITOT, PROT, ALBUMIN in the last 72 hours. No results for input(s): LIPASE, AMYLASE in the last 72 hours.  Recent Labs  03/05/16 1913  WBC 9.9  HGB 15.0  HCT 43.6  MCV 87.0  PLT 182    Recent Labs  03/05/16 1913  TROPONINI <0.03   Invalid input(s): POCBNP No results for input(s): DDIMER in the last 72 hours. No results for input(s): HGBA1C in the last 72 hours. No results for input(s): CHOL, HDL, LDLCALC, TRIG, CHOLHDL, LDLDIRECT in the last 72 hours. No results for input(s): TSH, T4TOTAL, T3FREE, THYROIDAB in the  last 72 hours.  Invalid input(s): FREET3 No results for input(s): VITAMINB12, FOLATE, FERRITIN, TIBC, IRON, RETICCTPCT in the last 72 hours.  Micro Results: No results found for this or any previous visit (from the past 240 hour(s)).   Radiological Exams on Admission: Dg Chest 2 View  03/05/2016  CLINICAL DATA:  Chest pain.  History of myocardial infarction. EXAM: CHEST  2 VIEW COMPARISON:  CT 03/05/2016. Most recent chest radiograph of 01/16/2014. FINDINGS: Midline trachea. Normal heart size and mediastinal contours. Mild left hemidiaphragm elevation. No pleural effusion or pneumothorax. Clear lungs. IMPRESSION: No acute cardiopulmonary disease. Electronically Signed   By: Abigail Miyamoto M.D.   On: 03/05/2016 19:44   Ct Chest Wo Contrast  03/05/2016  CLINICAL DATA:  Two year  interval followup of likely benign nodules in the left lower lobe. Current smoker. EXAM: CT CHEST WITHOUT CONTRAST TECHNIQUE: Multidetector CT imaging of the chest was performed following the standard protocol without IV contrast. COMPARISON:  12/02/2014, 02/25/2014. FINDINGS: Mediastinum/Lymph Nodes: Heart size normal. Moderate-to-marked 3 vessel coronary atherosclerosis as noted previously. Mild to moderate atherosclerosis involving the thoracic and upper abdominal aorta without evidence of aneurysm. No pericardial effusion. No pathologically enlarged mediastinal, hilar or axillary lymph nodes. No mediastinal masses. Normal-appearing esophagus. Visualized thyroid gland unremarkable. Lungs/Pleura: In the superior segment left lower lobe there is a 6 x 4 mm nodule (5 mm mean diameter) on image 60 of series 3. This nodule has a polygonal shaped, is subpleural in location and is unchanged dating back to June, 2015. In the deep posterior basal segment of the left lower lobe there is a 4 x 4 mm nodule (4 mm mean diameter) on image 114 of series 3. This nodule is unchanged dating back to June, 2015. No new or enlarging nodules in either  lung. Bullous emphysematous changes in both lungs, with numerous apical blebs, right greater than left. No confluent airspace consolidation. No evidence of interstitial lung disease. No pleural effusions. Upper abdomen: Normal unenhanced appearance. Musculoskeletal: Mild degenerative disc disease and spondylosis involving the mid thoracic spine. Benign bone island in the T8 vertebral body. No acute abnormality. IMPRESSION: 1. Stable left lower lobe lung nodules dating back to June, 2015. As 2 years of stability has now been documented, no further imaging followup is felt necessary. 2. COPD/emphysema.  No acute cardiopulmonary disease. 3. Moderate to marked three-vessel coronary atherosclerosis as noted previously. Electronically Signed   By: Evangeline Dakin M.D.   On: 03/05/2016 10:05    Assessment/Plan Present on Admission:  . Chest pain -Admit to med telemetry, 23 hour observation -Cycle cardiac enzymes, lipid panel in a.m. -Aspirin, nitroglycerin, beta blockers ordered -Continue home medications and statin -Consult cardiology. Patient with a recent negative stress tests  . CAD (coronary artery disease) -See above  . COPD (chronic obstructive pulmonary disease) ([place]) . Tobacco use -Nicotine patch, duonebs as needed  . Cirrhosis ([place]) -Idiopathic, stable, aware  . Hyperlipidemia -Stable, home medications resumed  . Hypertension - Stable, home medications resumed. Her blood pressure medications ordered IV  . GERD (gastroesophageal reflux disease) -Stable, home medications resumed   . Pulmonary nodules -Stable, aware   Scott Rosario 03/05/2016, 8:24 PM

## 2016-03-05 NOTE — ED Provider Notes (Signed)
[facility] Emergency Department Provider Note  ____________________________________________  Time seen: Approximately 7:35 PM  I have reviewed the triage vital signs and the nursing notes.   HISTORY  Chief Complaint Chest Pain    HPI Scott Rosario is a 55 y.o. male presents for evaluation of chest pain. Patient has been having some intermittent discomfort in his left side of the chest in the evenings and also with walking for about a week. 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 He reports it feels like his previous "heart attack" which occurred about 15 years ago. He has 2 stents and takes aspirin. He drove to the EMS office, and was given additional 324 mg of aspirin and he reports his pain was relieved. Presently he rates no discomfort, but does report that he is concerned that he may have been having heart pain.  He denies any fevers or chills. No wheezing, no shortness of breath, no sharp chest pain. Described as severe 10 out of 10 pressure radiating in the left arm which is now gone away.   Past Medical History  Diagnosis Date  . Heart attack ([place])   . Asthma   . Emphysema of lung ([place])   . COPD (chronic obstructive pulmonary disease) ([place])   . Diabetes mellitus without complication ([place])   . GERD (gastroesophageal reflux disease)   . Hyperlipidemia   . Hypertension   . Tobacco use   . Pulmonary nodules     x 3, (67mm, 74mm, 40mm) chest CT February 25, 2014  . CAD (coronary artery disease)   . Cirrhosis ([place])   . Alkaline phosphatase elevation   . Aortic atherosclerosis ([place])     on CT 02/25/14  . Renal cyst     on CT 02/25/14    Patient Active Problem List   Diagnosis Date Noted  . Chest pain 03/05/2016  . Medication monitoring encounter 08/19/2015  . COPD (chronic obstructive pulmonary disease) ([place])   . Emphysema of lung ([place])   . Diabetes  mellitus without complication ([place])   . GERD (gastroesophageal reflux disease)   . Hyperlipidemia   . Hypertension   . Tobacco use   . Pulmonary nodules   . CAD (coronary artery disease)   . Cirrhosis ([place])   . Alkaline phosphatase elevation   . Aortic atherosclerosis ([place])   . Multiple pulmonary nodules determined by computed tomography of lung 12/31/2014  . COPD mixed type ([place]) 12/31/2014  . Acute non-ST elevation myocardial infarction (NSTEMI) ([place]) 12/10/2014    Past Surgical History  Procedure Laterality Date  . Coronary angioplasty with stent placement  10/25/2003    x 2  . Appendectomy      Current Outpatient Rx  Name  Route  Sig  Dispense  Refill  . albuterol (PROAIR HFA) 108 (90 BASE) MCG/ACT inhaler   Inhalation   Inhale 2 puffs into the lungs every 4 (four) hours as needed for wheezing or shortness of breath.   1 Inhaler   3   . aspirin EC 81 MG tablet   Oral   Take 81 mg by mouth daily.         Marland Kitchen atorvastatin (LIPITOR) 80 MG tablet   Oral   Take 80 mg by mouth at bedtime.         . canagliflozin (INVOKANA) 100 MG TABS tablet   Oral   Take 100 mg  by mouth daily before breakfast.         . clopidogrel (PLAVIX) 75 MG tablet   Oral   Take 75 mg by mouth daily.          Marland Kitchen glipiZIDE (GLUCOTROL XL) 10 MG 24 hr tablet   Oral   Take 10 mg by mouth daily with breakfast.         . losartan (COZAAR) 50 MG tablet   Oral   Take 50 mg by mouth daily.         . metoprolol succinate (TOPROL-XL) 25 MG 24 hr tablet   Oral   Take 25 mg by mouth daily.         . pantoprazole (PROTONIX) 40 MG tablet   Oral   Take 40 mg by mouth daily.         . isosorbide mononitrate (IMDUR) 30 MG 24 hr tablet   Oral   Take 1 tablet (30 mg total) by mouth daily.   30 tablet   0   . nitroGLYCERIN (NITROSTAT) 0.4 MG SL tablet   Sublingual   Place 1 tablet (0.4 mg total) under the tongue every 5 (five) minutes as needed for chest pain.   30 tablet   0      Allergies Review of patient's allergies indicates no known allergies.  Family History  Problem Relation Age of Onset  . Diabetes Mother   . Hypertension Mother   . Arthritis Mother   . Hyperlipidemia Mother   . Heart attack Father   . Epilepsy Father   . Diabetes Father   . Seizures Father   . Heart disease Father   . Hyperlipidemia Father   . Hypertension Father   . Stroke Father     Social History Social History  Substance Use Topics  . Smoking status: Current Every Day Smoker -- 1.00 packs/day for 40 years    Types: Cigarettes  . Smokeless tobacco: Never Used  . Alcohol Use: No    Review of Systems Constitutional: No fever/chills Eyes: No visual changes. ENT: No sore throat. Cardiovascular: See history of present illness  Respiratory: Denies shortness of breath. Gastrointestinal: No abdominal pain.  No nausea, no vomiting.  No diarrhea.  No constipation. Genitourinary: Negative for dysuria. Musculoskeletal: Negative for back pain. Skin: Negative for rash. Neurological: Negative for headaches, focal weakness or numbness.  10-point ROS otherwise negative.  ____________________________________________   PHYSICAL EXAM:  VITAL SIGNS: ED Triage Vitals  Enc Vitals Group     BP 03/05/16 1900 159/96 mmHg     Pulse Rate 03/05/16 1900 140     Resp 03/05/16 1900 9     Temp 03/05/16 1904 98.3 F (36.8 C)     Temp Source 03/05/16 1904 Oral     SpO2 03/05/16 1858 100 %     Weight 03/05/16 1904 197 lb 9.6 oz (89.631 kg)     Height 03/05/16 1904 5\' 8"  (1.727 m)     Head Cir --      Peak Flow --      Pain Score 03/05/16 1905 4     Pain Loc --      Pain Edu? --      Excl. in [place]? --    Constitutional: Alert and oriented. Well appearing and in no acute distress. Eyes: Conjunctivae are normal. PERRL. EOMI. Head: Atraumatic. Nose: No congestion/rhinnorhea. Mouth/Throat: Mucous membranes are moist.  Oropharynx non-erythematous. Neck: No stridor.    Cardiovascular: Normal rate, regular rhythm.  Grossly normal heart sounds.  Good peripheral circulation. Respiratory: Normal respiratory effort.  No retractions. Lungs CTAB. Gastrointestinal: Soft and nontender. No distention. No abdominal bruits. No CVA tenderness. Musculoskeletal: No lower extremity tenderness nor edema.  No joint effusions. Neurologic:  Normal speech and language. No gross focal neurologic deficits are appreciated. No gait instability. Skin:  Skin is warm, dry and intact. No rash noted. Psychiatric: Mood and affect are normal. Speech and behavior are normal.  ____________________________________________   LABS (all labs ordered are listed, but only abnormal results are displayed)  Labs Reviewed  BASIC METABOLIC PANEL - Abnormal; Notable for the following:    CO2 21 (*)    Glucose, Bld 108 (*)    Calcium 8.4 (*)    All other components within normal limits  TROPONIN I - Abnormal; Notable for the following:    Troponin I 0.18 (*)    All other components within normal limits  TROPONIN I - Abnormal; Notable for the following:    Troponin I 0.21 (*)    All other components within normal limits  GLUCOSE, CAPILLARY - Abnormal; Notable for the following:    Glucose-Capillary 136 (*)    All other components within normal limits  BASIC METABOLIC PANEL - Abnormal; Notable for the following:    Glucose, Bld 160 (*)    Calcium 8.8 (*)    All other components within normal limits  CBC - Abnormal; Notable for the following:    WBC 12.2 (*)    All other components within normal limits  LIPID PANEL - Abnormal; Notable for the following:    Triglycerides 209 (*)    HDL 25 (*)    VLDL 42 (*)    All other components within normal limits  GLUCOSE, CAPILLARY - Abnormal; Notable for the following:    Glucose-Capillary 136 (*)    All other components within normal limits  TROPONIN I - Abnormal; Notable for the following:    Troponin I 0.18 (*)    All other components within  normal limits  GLUCOSE, CAPILLARY - Abnormal; Notable for the following:    Glucose-Capillary 158 (*)    All other components within normal limits  CBC  TROPONIN I   ____________________________________________  EKG  ED ECG REPORT I, QUALE, MARK, the attending physician, personally viewed and interpreted this ECG.  Date: 03/05/2016 EKG Time: 1900 Rate: 75 Rhythm: normal sinus rhythm QRS Axis: normal Intervals: normal ST/T Wave abnormalities: normal Conduction Disturbances: none Narrative Interpretation: unremarkable  Date: 03/05/2016 EKG Time: 1930 Rate: 75 Rhythm: normal sinus rhythm QRS Axis: normal Intervals: normal ST/T Wave abnormalities: normal Conduction Disturbances: none Narrative Interpretation: unremarkable. Patient is pain and symptom-free at the time of this twelve-lead  ____________________________________________  RADIOLOGY  DG Chest 2 View (Final result) Result time: 03/05/16 19:44:38   Final result by Rad Results In Interface (03/05/16 19:44:38)   Narrative:   CLINICAL DATA: Chest pain. History of myocardial infarction.  EXAM: CHEST 2 VIEW  COMPARISON: CT 03/05/2016. Most recent chest radiograph of 01/16/2014.  FINDINGS: Midline trachea. Normal heart size and mediastinal contours. Mild left hemidiaphragm elevation. No pleural effusion or pneumothorax. Clear lungs.  IMPRESSION: No acute cardiopulmonary disease.   Electronically Signed By: Abigail Miyamoto M.D. On: 03/05/2016 19:44       ____________________________________________   PROCEDURES  Procedure(s) performed: None  Critical Care performed: No  ____________________________________________   INITIAL IMPRESSION / ASSESSMENT AND PLAN / ED COURSE  Pertinent labs & imaging results that were available during my  care of the patient were reviewed by me and considered in my medical decision making (see chart for details).  Because of exertional chest pain.  Clinical history and past nuchal history seems highly suggest acute coronary syndrome. His first troponin is negative, however given symptomatology will admit him for further workup and rule out. No evidence support acute STEMI, however the risk for ACS is high. No ripping tearing symptoms or anything to suggest a need for evaluation of dissection or pulmonary embolism at this time. The patient does report complete relief of symptoms at present with no pain or discomfort and nitroglycerin. Discussed the hospital service, patient will be admitted for further workup and chest pain rule out. ____________________________________________   FINAL CLINICAL IMPRESSION(S) / ED DIAGNOSES  Final diagnoses:  Chest pain with high risk of acute coronary syndrome      Delman Kitten, MD 03/07/16 [id number]

## 2016-03-05 NOTE — ED Notes (Signed)
Pt presents to ED via EMS c/o chest pain. 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 EMS stated pt has a hx of MI at 58, HTN, and COPD; EMS gave 324 mg of aspirin. Pt alert and oriented in no acute distress at this time.

## 2016-03-06 ENCOUNTER — Telehealth: Payer: Self-pay | Admitting: Family Medicine

## 2016-03-06 LAB — GLUCOSE, CAPILLARY
GLUCOSE-CAPILLARY: 136 mg/dL — AB (ref 65–99)
Glucose-Capillary: 158 mg/dL — ABNORMAL HIGH (ref 65–99)

## 2016-03-06 LAB — LIPID PANEL
CHOLESTEROL: 105 mg/dL (ref 0–200)
HDL: 25 mg/dL — ABNORMAL LOW (ref >40)
LDL Cholesterol: 38 mg/dL (ref 0–99)
Total CHOL/HDL Ratio: 4.2 RATIO
Triglycerides: 209 mg/dL — ABNORMAL HIGH (ref <150)
VLDL: 42 mg/dL — ABNORMAL HIGH (ref 0–40)

## 2016-03-06 LAB — TROPONIN I
TROPONIN I: 0.18 ng/mL — AB (ref <0.031)
Troponin I: 0.21 ng/mL — ABNORMAL HIGH (ref <0.031)
Troponin I: 0.18 ng/mL — ABNORMAL HIGH (ref <0.031)

## 2016-03-06 LAB — BASIC METABOLIC PANEL
Anion gap: 5 (ref 5–15)
BUN: 16 mg/dL (ref 6–20)
CALCIUM: 8.8 mg/dL — AB (ref 8.9–10.3)
CHLORIDE: 106 mmol/L (ref 101–111)
CO2: 27 mmol/L (ref 22–32)
CREATININE: 0.85 mg/dL (ref 0.61–1.24)
GFR calc non Af Amer: >60 mL/min (ref >60)
Glucose, Bld: 160 mg/dL — ABNORMAL HIGH (ref 65–99)
Potassium: 3.5 mmol/L (ref 3.5–5.1)
Sodium: 138 mmol/L (ref 135–145)

## 2016-03-06 LAB — CBC
HCT: 41.4 % (ref 40.0–52.0)
Hemoglobin: 14.1 g/dL (ref 13.0–18.0)
MCH: 29.6 pg (ref 26.0–34.0)
MCHC: 34 g/dL (ref 32.0–36.0)
MCV: 87.1 fL (ref 80.0–100.0)
PLATELETS: 162 10*3/uL (ref 150–440)
RBC: 4.76 MIL/uL (ref 4.40–5.90)
RDW: 13.5 % (ref 11.5–14.5)
WBC: 12.2 10*3/uL — ABNORMAL HIGH (ref 3.8–10.6)

## 2016-03-06 MED ORDER — ISOSORBIDE MONONITRATE ER 30 MG PO TB24: 30.0000 mg | ORAL_TABLET | Freq: Every day | ORAL | Status: DC

## 2016-03-06 MED ORDER — NITROGLYCERIN 0.4 MG SL SUBL: 0.4000 mg | SUBLINGUAL_TABLET | SUBLINGUAL | Status: DC | PRN

## 2016-03-06 MED ORDER — ENOXAPARIN SODIUM 100 MG/ML ~~LOC~~ SOLN
1.0000 mg/kg | Freq: Two times a day (BID) | SUBCUTANEOUS | Status: DC
  Administered 2016-03-06: 85 mg via SUBCUTANEOUS
  Filled 2016-03-06: qty 1

## 2016-03-06 MED ORDER — ENOXAPARIN SODIUM 60 MG/0.6ML ~~LOC~~ SOLN
45.0000 mg | Freq: Once | SUBCUTANEOUS | Status: AC
  Administered 2016-03-06: 45 mg via SUBCUTANEOUS
  Filled 2016-03-06: qty 0.6

## 2016-03-06 NOTE — Telephone Encounter (Signed)
Patient's chest CT for lung cancer screening was just done; results back and I see he is actually in the hospital now; will call him there to get update and go over results; he usually sees Dr. Serafina Royals (cardiologist); hx of NSTEMI years ago

## 2016-03-06 NOTE — Progress Notes (Signed)
Dr. Ubaldo Glassing made aware of 4th troponin.

## 2016-03-06 NOTE — Telephone Encounter (Signed)
I spoke with patient; he sounded upbeat; trop had gone up so they're watching him he says; might have a blockage He still smokes; I am here to help him quit smoking Next chest CT in one year for screening

## 2016-03-06 NOTE — Progress Notes (Signed)
Pt discharged to home via wc.  Instructions given to pt. RX for nitro/imdur escribed to [place] for pt to pick up.   Questions answered.  No distress.

## 2016-03-06 NOTE — Plan of Care (Signed)
Problem: Consults Goal: Tobacco Cessation referral if indicated Outcome: Progressing Education given  Problem: Phase I Progression Outcomes Goal: MD aware of Cardiac Marker results Outcome: Progressing Cardiology  Consult ordered

## 2016-03-06 NOTE — Progress Notes (Signed)
Pt remains pain free since arrival to floor.  Elevated troponin level reported to Dr Estanislado Pandy.  Lovenox dose adjusted.

## 2016-03-06 NOTE — Discharge Summary (Signed)
[facility] [place] NAME: Scott Rosario    MR#:  [id number]  DATE OF BIRTH:  [birth year]  DATE OF ADMISSION:  03/05/2016 ADMITTING PHYSICIAN: Quintella Baton, MD  DATE OF DISCHARGE: 03/06/2016  1:45 PM  PRIMARY CARE PHYSICIAN: Enid Derry, MD    ADMISSION DIAGNOSIS:  Chest pain with high risk of acute coronary syndrome [R07.9]  DISCHARGE DIAGNOSIS:  Principal Problem:   Chest pain Active Problems:   COPD (chronic obstructive pulmonary disease) ([place])   Diabetes mellitus without complication ([facility])   GERD (gastroesophageal reflux disease)   Hyperlipidemia   Hypertension   Tobacco use   Pulmonary nodules   CAD (coronary artery disease)   Cirrhosis ([place])   SECONDARY DIAGNOSIS:   Past Medical History  Diagnosis Date  . Heart attack ([place])   . Asthma   . Emphysema of lung ([place])   . COPD (chronic obstructive pulmonary disease) ([place])   . Diabetes mellitus without complication ([place])   . GERD (gastroesophageal reflux disease)   . Hyperlipidemia   . Hypertension   . Tobacco use   . Pulmonary nodules     x 3, (62mm, 65mm, 35mm) chest CT February 25, 2014  . CAD (coronary artery disease)   . Cirrhosis ([place])   . Alkaline phosphatase elevation   . Aortic atherosclerosis ([place])     on CT 02/25/14  . Renal cyst     on CT 02/25/14    HOSPITAL COURSE:   1. Chest pain and elevated troponin. Case discussed with Dr. Ubaldo Glassing cardiology. The patient does want to stay in the hospital. Outpatient follow-up with Dr. Ubaldo Glassing for Dr. Nehemiah Massed 9 AM Monday morning. Likely he'll need outpatient cardiac catheterization. Continue aspirin and Plavix, Toprol and statin. Add imdur and nitroglycerin tablets. 2. Tobacco abuse smoking cessation counseling done 3 minutes by me. 3. Essential hypertension continue usual medication 4. Pulmonary nodules stable for a long period of time 5. Type 2 diabetes continue outpatient medication 6. Hyperlipidemia unspecified on  statin   DISCHARGE CONDITIONS:   Fair  CONSULTS OBTAINED:  Treatment Team:  Teodoro Spray, MD  DRUG ALLERGIES:  No Known Allergies  DISCHARGE MEDICATIONS:   Discharge Medication List as of 03/06/2016  1:37 PM    START taking these medications   Details  isosorbide mononitrate (IMDUR) 30 MG 24 hr tablet Take 1 tablet (30 mg total) by mouth daily., Starting 03/06/2016, Until Discontinued, Normal    nitroGLYCERIN (NITROSTAT) 0.4 MG SL tablet Place 1 tablet (0.4 mg total) under the tongue every 5 (five) minutes as needed for chest pain., Starting 03/06/2016, Until Discontinued, Normal      CONTINUE these medications which have NOT CHANGED   Details  albuterol (PROAIR HFA) 108 (90 BASE) MCG/ACT inhaler Inhale 2 puffs into the lungs every 4 (four) hours as needed for wheezing or shortness of breath., Starting 12/31/2014, Until Discontinued, Normal    aspirin EC 81 MG tablet Take 81 mg by mouth daily., Until Discontinued, Historical Med    atorvastatin (LIPITOR) 80 MG tablet Take 80 mg by mouth at bedtime., Until Discontinued, Historical Med    canagliflozin (INVOKANA) 100 MG TABS tablet Take 100 mg by mouth daily before breakfast., Until Discontinued, Historical Med    clopidogrel (PLAVIX) 75 MG tablet Take 75 mg by mouth daily. , Until Discontinued, Historical Med    glipiZIDE (GLUCOTROL XL) 10 MG 24 hr tablet Take 10 mg by mouth daily with breakfast., Until Discontinued, Historical Med  losartan (COZAAR) 50 MG tablet Take 50 mg by mouth daily., Until Discontinued, Historical Med    metoprolol succinate (TOPROL-XL) 25 MG 24 hr tablet Take 25 mg by mouth daily., Until Discontinued, Historical Med    pantoprazole (PROTONIX) 40 MG tablet Take 40 mg by mouth daily., Until Discontinued, Historical Med         DISCHARGE INSTRUCTIONS:   Follow-up PMD 2 weeks Follow-up with Dr. Ubaldo Glassing or Dr. Nehemiah Massed on Monday 9 AM  If you experience worsening of your admission symptoms, develop  shortness of breath, life threatening emergency, suicidal or homicidal thoughts you must seek medical attention immediately by calling 911 or calling your MD immediately  if symptoms less severe.  You Must read complete instructions/literature along with all the possible adverse reactions/side effects for all the Medicines you take and that have been prescribed to you. Take any new Medicines after you have completely understood and accept all the possible adverse reactions/side effects.   Please note  You were cared for by a hospitalist during your hospital stay. If you have any questions about your discharge medications or the care you received while you were in the hospital after you are discharged, you can call the unit and asked to speak with the hospitalist on call if the hospitalist that took care of you is not available. Once you are discharged, your primary care physician will handle any further medical issues. Please note that NO REFILLS for any discharge medications will be authorized once you are discharged, as it is imperative that you return to your primary care physician (or establish a relationship with a primary care physician if you do not have one) for your aftercare needs so that they can reassess your need for medications and monitor your lab values.    Today   CHIEF COMPLAINT:   Chief Complaint  Patient presents with  . Chest Pain    HISTORY OF PRESENT ILLNESS:  Scott Rosario  is a 55 y.o. male admitted with chest pain and had borderline elevated troponin.   VITAL SIGNS:  Blood pressure 106/47, pulse 66, temperature 97.4 F (36.3 C), temperature source Oral, resp. rate 20, height 5\' 8"  (1.727 m), weight 87 kg (191 lb 12.8 oz), SpO2 100 %.  I/O:   Intake/Output Summary (Last 24 hours) at 03/06/16 1613 Last data filed at 03/06/16 1337  Gross per 24 hour  Intake    483 ml  Output   1250 ml  Net   -767 ml    PHYSICAL EXAMINATION:  GENERAL:  55 y.o.-year-old  patient lying in the bed with no acute distress.  EYES: Pupils equal, round, reactive to light and accommodation. No scleral icterus. Extraocular muscles intact.  HEENT: Head atraumatic, normocephalic. Oropharynx and nasopharynx clear.  NECK:  Supple, no jugular venous distention. No thyroid enlargement, no tenderness.  LUNGS: Normal breath sounds bilaterally, no wheezing, rales,rhonchi or crepitation. No use of accessory muscles of respiration.  CARDIOVASCULAR: S1, S2 normal. No murmurs, rubs, or gallops.  ABDOMEN: Soft, non-tender, non-distended. Bowel sounds present. No organomegaly or mass.  EXTREMITIES: No pedal edema, cyanosis, or clubbing.  NEUROLOGIC: Cranial nerves II through XII are intact. Muscle strength 5/5 in all extremities. Sensation intact. Gait not checked.  PSYCHIATRIC: The patient is alert and oriented x 3.  SKIN: No obvious rash, lesion, or ulcer.   DATA REVIEW:   CBC  Recent Labs Lab 03/06/16 0159  WBC 12.2*  HGB 14.1  HCT 41.4  PLT 162  Chemistries   Recent Labs Lab 03/06/16 0159  NA 138  K 3.5  CL 106  CO2 27  GLUCOSE 160*  BUN 16  CREATININE 0.85  CALCIUM 8.8*    Cardiac Enzymes  Recent Labs Lab 03/06/16 1203  TROPONINI 0.18*      RADIOLOGY:  Dg Chest 2 View  03/05/2016  CLINICAL DATA:  Chest pain.  History of myocardial infarction. EXAM: CHEST  2 VIEW COMPARISON:  CT 03/05/2016. Most recent chest radiograph of 01/16/2014. FINDINGS: Midline trachea. Normal heart size and mediastinal contours. Mild left hemidiaphragm elevation. No pleural effusion or pneumothorax. Clear lungs. IMPRESSION: No acute cardiopulmonary disease. Electronically Signed   By: Abigail Miyamoto M.D.   On: 03/05/2016 19:44   Ct Chest Wo Contrast  03/05/2016  CLINICAL DATA:  Two year interval followup of likely benign nodules in the left lower lobe. Current smoker. EXAM: CT CHEST WITHOUT CONTRAST TECHNIQUE: Multidetector CT imaging of the chest was performed following the  standard protocol without IV contrast. COMPARISON:  12/02/2014, 02/25/2014. FINDINGS: Mediastinum/Lymph Nodes: Heart size normal. Moderate-to-marked 3 vessel coronary atherosclerosis as noted previously. Mild to moderate atherosclerosis involving the thoracic and upper abdominal aorta without evidence of aneurysm. No pericardial effusion. No pathologically enlarged mediastinal, hilar or axillary lymph nodes. No mediastinal masses. Normal-appearing esophagus. Visualized thyroid gland unremarkable. Lungs/Pleura: In the superior segment left lower lobe there is a 6 x 4 mm nodule (5 mm mean diameter) on image 60 of series 3. This nodule has a polygonal shaped, is subpleural in location and is unchanged dating back to June, 2015. In the deep posterior basal segment of the left lower lobe there is a 4 x 4 mm nodule (4 mm mean diameter) on image 114 of series 3. This nodule is unchanged dating back to June, 2015. No new or enlarging nodules in either lung. Bullous emphysematous changes in both lungs, with numerous apical blebs, right greater than left. No confluent airspace consolidation. No evidence of interstitial lung disease. No pleural effusions. Upper abdomen: Normal unenhanced appearance. Musculoskeletal: Mild degenerative disc disease and spondylosis involving the mid thoracic spine. Benign bone island in the T8 vertebral body. No acute abnormality. IMPRESSION: 1. Stable left lower lobe lung nodules dating back to June, 2015. As 2 years of stability has now been documented, no further imaging followup is felt necessary. 2. COPD/emphysema.  No acute cardiopulmonary disease. 3. Moderate to marked three-vessel coronary atherosclerosis as noted previously. Electronically Signed   By: Evangeline Dakin M.D.   On: 03/05/2016 10:05    Management plans discussed with the patient, family and they are in agreement.  CODE STATUS:     Code Status Orders        Start     Ordered   03/05/16 2316  Full code    Continuous     03/05/16 2315    Code Status History    Date Active Date Inactive Code Status Order ID Comments User Context   This patient has a current code status but no historical code status.    Advance Directive Documentation        Most Recent Value   Type of Advance Directive  Healthcare Power of Attorney   Pre-existing out of facility DNR order (yellow form or pink MOST form)     "MOST" Form in Place?        TOTAL TIME TAKING CARE OF THIS PATIENT: 35 minutes.    Loletha Grayer M.D on 03/06/2016 at 4:13 PM  Between 7am  to 6pm - Pager - [phone]  After 6pm go to [email or web address] - password [email or web address]  [facility] Office  [phone]  CC: Primary care physician; Enid Derry, MD

## 2016-03-09 ENCOUNTER — Ambulatory Visit
Admission: RE | Admit: 2016-03-09 | Discharge: 2016-03-10 | Disposition: A | Discharge status: Home / Self Care | Payer: BLUE CROSS/BLUE SHIELD | Source: Ambulatory Visit | Attending: Cardiology | Admitting: Cardiology

## 2016-03-09 ENCOUNTER — Encounter
Admission: RE | Disposition: A | Discharge status: Home / Self Care | Payer: Self-pay | Source: Ambulatory Visit | Attending: Cardiology

## 2016-03-09 DIAGNOSIS — I259 Chronic ischemic heart disease, unspecified: Secondary | ICD-10-CM | POA: Diagnosis not present

## 2016-03-09 DIAGNOSIS — I200 Unstable angina: Secondary | ICD-10-CM | POA: Diagnosis present

## 2016-03-09 DIAGNOSIS — E7800 Pure hypercholesterolemia, unspecified: Secondary | ICD-10-CM | POA: Insufficient documentation

## 2016-03-09 DIAGNOSIS — I10 Essential (primary) hypertension: Secondary | ICD-10-CM | POA: Diagnosis not present

## 2016-03-09 DIAGNOSIS — Z7982 Long term (current) use of aspirin: Secondary | ICD-10-CM | POA: Insufficient documentation

## 2016-03-09 DIAGNOSIS — I2510 Atherosclerotic heart disease of native coronary artery without angina pectoris: Secondary | ICD-10-CM | POA: Diagnosis not present

## 2016-03-09 DIAGNOSIS — Z7902 Long term (current) use of antithrombotics/antiplatelets: Secondary | ICD-10-CM | POA: Diagnosis not present

## 2016-03-09 DIAGNOSIS — Z7984 Long term (current) use of oral hypoglycemic drugs: Secondary | ICD-10-CM | POA: Insufficient documentation

## 2016-03-09 DIAGNOSIS — E119 Type 2 diabetes mellitus without complications: Secondary | ICD-10-CM | POA: Diagnosis not present

## 2016-03-09 DIAGNOSIS — F17200 Nicotine dependence, unspecified, uncomplicated: Secondary | ICD-10-CM | POA: Insufficient documentation

## 2016-03-09 DIAGNOSIS — Z79899 Other long term (current) drug therapy: Secondary | ICD-10-CM | POA: Insufficient documentation

## 2016-03-09 DIAGNOSIS — E782 Mixed hyperlipidemia: Secondary | ICD-10-CM | POA: Insufficient documentation

## 2016-03-09 DIAGNOSIS — R079 Chest pain, unspecified: Secondary | ICD-10-CM | POA: Diagnosis present

## 2016-03-09 HISTORY — PX: CARDIAC CATHETERIZATION: SHX172

## 2016-03-09 SURGERY — LEFT HEART CATH AND CORONARY ANGIOGRAPHY
Anesthesia: Moderate Sedation

## 2016-03-09 MED ORDER — MIDAZOLAM HCL 2 MG/2ML IJ SOLN
INTRAMUSCULAR | Status: AC
  Filled 2016-03-09: qty 2

## 2016-03-09 MED ORDER — HEPARIN (PORCINE) IN NACL 2-0.9 UNIT/ML-% IJ SOLN
INTRAMUSCULAR | Status: AC
  Filled 2016-03-09: qty 500

## 2016-03-09 MED ORDER — FENTANYL CITRATE (PF) 100 MCG/2ML IJ SOLN
INTRAMUSCULAR | Status: DC | PRN
  Administered 2016-03-09: 25 ug via INTRAVENOUS

## 2016-03-09 MED ORDER — ISOSORBIDE MONONITRATE ER 30 MG PO TB24
30.0000 mg | ORAL_TABLET | Freq: Every day | ORAL | Status: DC
  Administered 2016-03-09: 30 mg via ORAL
  Filled 2016-03-09: qty 1

## 2016-03-09 MED ORDER — ALUM & MAG HYDROXIDE-SIMETH 200-200-20 MG/5ML PO SUSP
ORAL | Status: AC
  Administered 2016-03-09: 30 mL
  Filled 2016-03-09: qty 30

## 2016-03-09 MED ORDER — SODIUM CHLORIDE 0.9 % WEIGHT BASED INFUSION: 3.0000 mL/kg/h | INTRAVENOUS | Status: DC

## 2016-03-09 MED ORDER — BIVALIRUDIN BOLUS VIA INFUSION - CUPID
INTRAVENOUS | Status: DC | PRN
  Administered 2016-03-09: 64.95 mg via INTRAVENOUS

## 2016-03-09 MED ORDER — SODIUM CHLORIDE 0.9% FLUSH: 3.0000 mL | Freq: Two times a day (BID) | INTRAVENOUS | Status: DC

## 2016-03-09 MED ORDER — LOSARTAN POTASSIUM 50 MG PO TABS
50.0000 mg | ORAL_TABLET | Freq: Every day | ORAL | Status: DC
  Administered 2016-03-09: 50 mg via ORAL
  Filled 2016-03-09: qty 1

## 2016-03-09 MED ORDER — METOPROLOL SUCCINATE ER 50 MG PO TB24: 25.0000 mg | ORAL_TABLET | Freq: Every day | ORAL | Status: DC

## 2016-03-09 MED ORDER — FENTANYL CITRATE (PF) 100 MCG/2ML IJ SOLN
INTRAMUSCULAR | Status: AC
  Filled 2016-03-09: qty 2

## 2016-03-09 MED ORDER — ONDANSETRON HCL 4 MG/2ML IJ SOLN: 4.0000 mg | Freq: Four times a day (QID) | INTRAMUSCULAR | Status: DC | PRN

## 2016-03-09 MED ORDER — NITROGLYCERIN 5 MG/ML IV SOLN
INTRAVENOUS | Status: AC
  Filled 2016-03-09: qty 10

## 2016-03-09 MED ORDER — NITROGLYCERIN 1 MG/10 ML FOR IR/CATH LAB
INTRA_ARTERIAL | Status: DC | PRN
  Administered 2016-03-09 (×2): 200 ug

## 2016-03-09 MED ORDER — ACETAMINOPHEN 325 MG PO TABS
650.0000 mg | ORAL_TABLET | ORAL | Status: DC | PRN
  Administered 2016-03-10: 650 mg via ORAL
  Filled 2016-03-09: qty 2

## 2016-03-09 MED ORDER — CANAGLIFLOZIN 300 MG PO TABS
300.0000 mg | ORAL_TABLET | Freq: Every day | ORAL | Status: DC
  Filled 2016-03-09: qty 1

## 2016-03-09 MED ORDER — ASPIRIN EC 325 MG PO TBEC: 325.0000 mg | DELAYED_RELEASE_TABLET | Freq: Every day | ORAL | Status: DC

## 2016-03-09 MED ORDER — CLOPIDOGREL BISULFATE 75 MG PO TABS
ORAL_TABLET | ORAL | Status: DC | PRN
  Administered 2016-03-09: 300 mg via ORAL

## 2016-03-09 MED ORDER — SODIUM CHLORIDE 0.9 % IV SOLN
INTRAVENOUS | Status: DC
  Administered 2016-03-09: 11:00:00 via INTRAVENOUS

## 2016-03-09 MED ORDER — IOPAMIDOL (ISOVUE-300) INJECTION 61%
INTRAVENOUS | Status: DC | PRN
  Administered 2016-03-09: 310 mL via INTRA_ARTERIAL

## 2016-03-09 MED ORDER — SODIUM CHLORIDE 0.9% FLUSH: 3.0000 mL | INTRAVENOUS | Status: DC | PRN

## 2016-03-09 MED ORDER — ASPIRIN 81 MG PO CHEW
CHEWABLE_TABLET | ORAL | Status: DC | PRN
  Administered 2016-03-09: 243 mg via ORAL

## 2016-03-09 MED ORDER — GLIPIZIDE ER 10 MG PO TB24
10.0000 mg | ORAL_TABLET | Freq: Every day | ORAL | Status: DC
  Administered 2016-03-10: 10 mg via ORAL
  Filled 2016-03-09: qty 1

## 2016-03-09 MED ORDER — ISOSORBIDE MONONITRATE ER 30 MG PO TB24: 30.0000 mg | ORAL_TABLET | Freq: Every day | ORAL | Status: DC

## 2016-03-09 MED ORDER — METOPROLOL SUCCINATE ER 50 MG PO TB24
25.0000 mg | ORAL_TABLET | Freq: Every day | ORAL | Status: DC
  Administered 2016-03-09: 25 mg via ORAL
  Filled 2016-03-09: qty 1

## 2016-03-09 MED ORDER — CLOPIDOGREL BISULFATE 75 MG PO TABS
ORAL_TABLET | ORAL | Status: AC
  Filled 2016-03-09: qty 4

## 2016-03-09 MED ORDER — SODIUM CHLORIDE 0.9 % WEIGHT BASED INFUSION: 3.0000 mL/kg/h | INTRAVENOUS | Status: AC

## 2016-03-09 MED ORDER — BIVALIRUDIN 250 MG IV SOLR
INTRAVENOUS | Status: AC
  Filled 2016-03-09: qty 250

## 2016-03-09 MED ORDER — ASPIRIN 81 MG PO CHEW
CHEWABLE_TABLET | ORAL | Status: AC
  Filled 2016-03-09: qty 3

## 2016-03-09 MED ORDER — ASPIRIN 81 MG PO CHEW: 81.0000 mg | CHEWABLE_TABLET | ORAL | Status: DC

## 2016-03-09 MED ORDER — CLOPIDOGREL BISULFATE 75 MG PO TABS
75.0000 mg | ORAL_TABLET | Freq: Every day | ORAL | Status: DC
  Administered 2016-03-10: 75 mg via ORAL
  Filled 2016-03-09: qty 1

## 2016-03-09 MED ORDER — SODIUM CHLORIDE 0.9 % IV SOLN: 250.0000 mL | INTRAVENOUS | Status: DC | PRN

## 2016-03-09 MED ORDER — ATORVASTATIN CALCIUM 20 MG PO TABS
80.0000 mg | ORAL_TABLET | Freq: Every day | ORAL | Status: DC
  Administered 2016-03-09: 80 mg via ORAL
  Filled 2016-03-09: qty 4
  Filled 2016-03-09: qty 1

## 2016-03-09 MED ORDER — MIDAZOLAM HCL 2 MG/2ML IJ SOLN
INTRAMUSCULAR | Status: DC | PRN
  Administered 2016-03-09: 1 mg via INTRAVENOUS

## 2016-03-09 MED ORDER — BIVALIRUDIN 250 MG IV SOLR
250.0000 mg | INTRAVENOUS | Status: DC | PRN
  Administered 2016-03-09: 1.75 mg/kg/h via INTRAVENOUS

## 2016-03-09 MED ORDER — SODIUM CHLORIDE 0.9 % WEIGHT BASED INFUSION: 1.0000 mL/kg/h | INTRAVENOUS | Status: DC

## 2016-03-09 SURGICAL SUPPLY — 17 items
BALLN MINITREK RX 2.0X20 (BALLOONS) ×3
BALLOON MINITREK RX 2.0X20 (BALLOONS) ×2 IMPLANT
CATH INFINITI 5FR ANG PIGTAIL (CATHETERS) ×6 IMPLANT
CATH INFINITI 5FR JL4 (CATHETERS) ×3 IMPLANT
CATH INFINITI JR4 5F (CATHETERS) ×3 IMPLANT
CATH VISTA GUIDE 6FR XB3.5 (CATHETERS) ×3 IMPLANT
DEVICE CLOSURE MYNXGRIP 6/7F (Vascular Products) ×3 IMPLANT
DEVICE INFLAT 30 PLUS (MISCELLANEOUS) ×3 IMPLANT
DEVICE SAFEGUARD 24CM (GAUZE/BANDAGES/DRESSINGS) ×3 IMPLANT
KIT MANI 3VAL PERCEP (MISCELLANEOUS) ×3 IMPLANT
NEEDLE PERC 18GX7CM (NEEDLE) ×3 IMPLANT
PACK CARDIAC CATH (CUSTOM PROCEDURE TRAY) ×3 IMPLANT
SHEATH AVANTI 6FR X 11CM (SHEATH) ×3 IMPLANT
SHEATH PINNACLE 5F 10CM (SHEATH) ×3 IMPLANT
STENT XIENCE ALPINE RX 2.25X15 (Permanent Stent) ×3 IMPLANT
WIRE ASAHI PROWATER 180CM (WIRE) ×3 IMPLANT
WIRE EMERALD 3MM-J .035X150CM (WIRE) ×3 IMPLANT

## 2016-03-09 NOTE — Progress Notes (Signed)
Pt resting post procedure, no bleeding nor hematoma at right groin site, report called to Jovita Kussmaul on telemetry, care nurse with orders and plan reviewed.angiomax gtt off.

## 2016-03-09 NOTE — Progress Notes (Signed)
Pt resting in recovery with wife present,  Post pci placement, on angiomax gtt which will finish @ 1505, no bleeding nor hematoma at right groin site, pad device intact until 1530 per orders since on gtt. sr per monitor, post ekg done,per orders Dr Cyndra Numbers out to speak with wife with questions answered, ns infusing for fluid 194ml/hour. Pt will stay overnight, and discharge tomorrow.

## 2016-03-09 NOTE — Discharge Instructions (Signed)

## 2016-03-09 NOTE — Progress Notes (Signed)
Patient on floor from specials. R groin dressing clean, dry, and intact. No signs of hematoma. Positive pulses. Up time is 1730, patient is aware. Wife at bedside. Telemetry verified with Edwena Blow, NT. Patient is NSR. No complaints, vss. Wilnette Kales

## 2016-03-10 ENCOUNTER — Encounter: Payer: Self-pay | Admitting: Internal Medicine

## 2016-03-10 DIAGNOSIS — I2510 Atherosclerotic heart disease of native coronary artery without angina pectoris: Secondary | ICD-10-CM | POA: Diagnosis not present

## 2016-03-10 LAB — CBC
HCT: 40.7 % (ref 40.0–52.0)
Hemoglobin: 14.1 g/dL (ref 13.0–18.0)
MCH: 29.8 pg (ref 26.0–34.0)
MCHC: 34.6 g/dL (ref 32.0–36.0)
MCV: 86.2 fL (ref 80.0–100.0)
Platelets: 165 10*3/uL (ref 150–440)
RBC: 4.72 MIL/uL (ref 4.40–5.90)
RDW: 13.3 % (ref 11.5–14.5)
WBC: 9.2 10*3/uL (ref 3.8–10.6)

## 2016-03-10 LAB — BASIC METABOLIC PANEL
Anion gap: 4 — ABNORMAL LOW (ref 5–15)
BUN: 16 mg/dL (ref 6–20)
CHLORIDE: 108 mmol/L (ref 101–111)
CO2: 25 mmol/L (ref 22–32)
CREATININE: 0.77 mg/dL (ref 0.61–1.24)
Calcium: 8.5 mg/dL — ABNORMAL LOW (ref 8.9–10.3)
Glucose, Bld: 151 mg/dL — ABNORMAL HIGH (ref 65–99)
Potassium: 4.4 mmol/L (ref 3.5–5.1)
SODIUM: 137 mmol/L (ref 135–145)

## 2016-03-10 NOTE — Discharge Summary (Signed)
[facility] Cardiology Discharge Summary  Patient ID: Scott Rosario MRN: [id number] DOB/AGE: 1961-09-08 55 y.o.  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  Hospital Course: The patient was admitted to specials for cardiac cath with selective coronary angiogram after full consent, risk and benefits explained, and time out called with all approprate details voiced and discussed. The patient has had progressive canadian class 4 angina  consistent with ischemic chest pain and or anginal equivalent with coronary artery risk factors including diabetes, high blood pressure, high cholesterol and smoking. The procedure was performed without complication and it revealed normal left ventricular function with ejection fraction of 55%.  It was found that the patient had severe 1 vessel coronary atherosclerosis with significant 1st diagonal stenosis requiring further intervention. Therefore, the patient had a PCI and drug eluding stent placed without complication. The patient has been ambulating without further significant symptoms and has reached his maximal hospital benefit and will be discharged to home in good condition.  Cardiac rehabilitation has been discussed and recommended. Medication management of cardiovascular risk factors will be given post discharge and modified as an outpatient.   Discharge Exam: Blood pressure 118/62, pulse 84, temperature 98.2 F (36.8 C), temperature source Oral, resp. rate 18, height 5\' 8"  (1.727 m), weight 191 lb (86.637 kg), SpO2 96 %.  Constitutional: [place] oriented to person, place, and time. No  distress.  HENT: No nasal discharge.  Head: Normocephalic and atraumatic.  Eyes: Pupils are equal and round. No discharge.  Neck: Normal range of motion. Neck supple. No JVD present. No thyromegaly present.  Cardiovascular: Normal rate, regular rhythm, normal S1 S2, no gallop, no friction rub. No murmur Pulmonary/Chest: Effort normal, No stridor. No respiratory distress. no wheezes.  no rales.    Abdominal: Soft. Bowel sounds are normal.  no distension.  no tenderness. There is no rebound and no guarding.  Musculoskeletal: No edema, no cyanosis, normal pulses, no bleeding, Normal range of motion. no tenderness.  Neurological:  alert and oriented to person, place, and time. Coordination normal.  Skin: Skin is warm and dry. No rash noted. No erythema. No pallor.  Psychiatric:  normal mood and affect. behavior is normal.    Labs:   Lab Results  Component Value Date   WBC 9.2 03/10/2016   HGB 14.1 03/10/2016   HCT 40.7 03/10/2016   MCV 86.2 03/10/2016   PLT 165 03/10/2016    Recent Labs Lab 03/10/16 0322  NA 137  K 4.4  CL 108  CO2 25  BUN 16  CREATININE 0.77  CALCIUM 8.5*  GLUCOSE 151*    EKG: NSR without evidence of new changes  FOLLOW UP IN ONE TO TWO WEEKS    Medication List    STOP taking these medications        isosorbide mononitrate 30 MG 24 hr tablet  Commonly known as:  IMDUR     nitroGLYCERIN 0.4 MG SL tablet  Commonly known as:  NITROSTAT     pantoprazole 40 MG tablet  Commonly known as:  PROTONIX      TAKE these medications        albuterol 108 (90 Base) MCG/ACT inhaler  Commonly known as:  PROAIR  HFA  Inhale 2 puffs into the lungs every 4 (four) hours as needed for wheezing or shortness of breath.     aspirin EC 81 MG tablet  Take 81 mg by mouth daily.     atorvastatin 80 MG tablet  Commonly known as:  LIPITOR  Take 80 mg by mouth at bedtime.     clopidogrel 75 MG tablet  Commonly known as:  PLAVIX  Take 75 mg by mouth daily.      glipiZIDE 10 MG 24 hr tablet  Commonly known as:  GLUCOTROL XL  Take 10 mg by mouth daily with breakfast.     INVOKANA 100 MG Tabs tablet  Generic drug:  canagliflozin  Take 100 mg by mouth daily before breakfast.     losartan 50 MG tablet  Commonly known as:  COZAAR  Take 50 mg by mouth daily.     metoprolol succinate 25 MG 24 hr tablet  Commonly known as:  TOPROL-XL  Take 25 mg by mouth daily.         THE PATIENT  SHALL BRING ALL MEDICATIONS TO FOLLOW UP APPOINTMENT  Signed:  Corey Skains MD, [facility] 03/10/2016, 7:59 AM

## 2016-03-10 NOTE — Progress Notes (Signed)
Patient d/c'd home. Education provided, no questions at this time. Patient picked up by wife. Telemetry removed. Luca Burston R [place]   

## 2016-03-10 NOTE — Progress Notes (Signed)
Stents placed yesterday. R groin dressing intact. No hematoma or bleeding. Pedal pulses palpable. Slept well through the night.

## 2016-04-02 ENCOUNTER — Other Ambulatory Visit: Payer: Self-pay | Admitting: Family Medicine

## 2016-04-03 ENCOUNTER — Other Ambulatory Visit: Payer: Self-pay | Admitting: Family Medicine

## 2016-04-05 NOTE — Telephone Encounter (Signed)
Last Cr and K+ reviewed; Rx approved 

## 2016-04-05 NOTE — Telephone Encounter (Signed)
Last Cr reviewed; Rx approved 

## 2016-04-09 ENCOUNTER — Ambulatory Visit (INDEPENDENT_AMBULATORY_CARE_PROVIDER_SITE_OTHER): Payer: BLUE CROSS/BLUE SHIELD | Admitting: Family Medicine

## 2016-04-09 ENCOUNTER — Encounter: Payer: Self-pay | Admitting: Family Medicine

## 2016-04-09 VITALS — BP 112/60 | HR 79 | Temp 97.6°F | Resp 16 | Wt 189.0 lb

## 2016-04-09 DIAGNOSIS — I10 Essential (primary) hypertension: Secondary | ICD-10-CM

## 2016-04-09 DIAGNOSIS — R079 Chest pain, unspecified: Secondary | ICD-10-CM | POA: Diagnosis not present

## 2016-04-09 DIAGNOSIS — E785 Hyperlipidemia, unspecified: Secondary | ICD-10-CM

## 2016-04-09 DIAGNOSIS — J449 Chronic obstructive pulmonary disease, unspecified: Secondary | ICD-10-CM | POA: Diagnosis not present

## 2016-04-09 DIAGNOSIS — I2510 Atherosclerotic heart disease of native coronary artery without angina pectoris: Secondary | ICD-10-CM

## 2016-04-09 DIAGNOSIS — I2583 Coronary atherosclerosis due to lipid rich plaque: Principal | ICD-10-CM

## 2016-04-09 DIAGNOSIS — Z720 Tobacco use: Secondary | ICD-10-CM | POA: Diagnosis not present

## 2016-04-09 NOTE — Assessment & Plan Note (Signed)
Small vessel disease; discussed case with cardiologist; continue aspirin and plavix and statin; smoking cessation is paramount; patient to use NTG if chest pain recurs and contact cardiologist; patient has appt with cardiologist in a few weeks; last LDL under 70

## 2016-04-09 NOTE — Assessment & Plan Note (Signed)
Urged patient to quit smoking 

## 2016-04-09 NOTE — Assessment & Plan Note (Addendum)
Urged patient to quit; discussed risk of Chantix with cardiologist and he agrees, not a good candidate for Chantix (cardiovascular risk); urged patient to pick a quit date (today or Saturday or Sunday); consider quitting together with his son; spend a day together over the weekend, fishing, in a boat, away from cigarettes, positive reinforcement, etc.; numbers to QUIT-NOW line and smoking cessation classes at the hospital given; see AVS; greater than 3 minutes spent counseling for smoking cessation

## 2016-04-09 NOTE — Assessment & Plan Note (Signed)
Well controlled 

## 2016-04-09 NOTE — Patient Instructions (Addendum)
Try to limit saturated fats in your diet (bologna, hot dogs, barbeque, cheeseburgers, hamburgers, steak, bacon, sausage, cheese, etc.) and get more fresh fruits, vegetables, and whole grains  I do encourage you to quit smoking Call [phone] to sign up for smoking cessation classes You can call 1-800-QUIT-NOW to talk with a smoking cessation coach  Steps to Quit Smoking  Smoking tobacco can be harmful to your health and can affect almost every organ in your body. Smoking puts you, and those around you, at risk for developing many serious chronic diseases. Quitting smoking is difficult, but it is one of the best things that you can do for your health. It is never too late to quit. WHAT ARE THE BENEFITS OF QUITTING SMOKING? When you quit smoking, you lower your risk of developing serious diseases and conditions, such as:  Lung cancer or lung disease, such as COPD.  Heart disease.  Stroke.  Heart attack.  Infertility.  Osteoporosis and bone fractures. Additionally, symptoms such as coughing, wheezing, and shortness of breath may get better when you quit. You may also find that you get sick less often because your body is stronger at fighting off colds and infections. If you are pregnant, quitting smoking can help to reduce your chances of having a baby of low birth weight. HOW DO I GET READY TO QUIT? When you decide to quit smoking, create a plan to make sure that you are successful. Before you quit:  Pick a date to quit. Set a date within the next two weeks to give you time to prepare.  Write down the reasons why you are quitting. Keep this list in places where you will see it often, such as on your bathroom mirror or in your car or wallet.  Identify the people, places, things, and activities that make you want to smoke (triggers) and avoid them. Make sure to take these actions:  Throw away all cigarettes at home, at work, and in your car.  Throw away smoking accessories, such as  Scientist, research (medical).  Clean your car and make sure to empty the ashtray.  Clean your home, including curtains and carpets.  Tell your family, friends, and coworkers that you are quitting. Support from your loved ones can make quitting easier.  Talk with your health care provider about your options for quitting smoking.  Find out what treatment options are covered by your health insurance. WHAT STRATEGIES CAN I USE TO QUIT SMOKING?  Talk with your healthcare provider about different strategies to quit smoking. Some strategies include:  Quitting smoking altogether instead of gradually lessening how much you smoke over a period of time. Research shows that quitting "cold [place]" is more successful than gradually quitting.  Attending in-person counseling to help you build problem-solving skills. You are more likely to have success in quitting if you attend several counseling sessions. Even short sessions of 10 minutes can be effective.  Finding resources and support systems that can help you to quit smoking and remain smoke-free after you quit. These resources are most helpful when you use them often. They can include:  Online chats with a Social worker.  Telephone quitlines.  Printed Furniture conservator/restorer.  Support groups or group counseling.  Text messaging programs.  Mobile phone applications.  Taking medicines to help you quit smoking. (If you are pregnant or breastfeeding, talk with your health care provider first.) Some medicines contain nicotine and some do not. Both types of medicines help with cravings, but the medicines that include  nicotine help to relieve withdrawal symptoms. Your health care provider may recommend:  Nicotine patches, gum, or lozenges.  Nicotine inhalers or sprays.  Non-nicotine medicine that is taken by mouth. Talk with your health care provider about combining strategies, such as taking medicines while you are also receiving in-person counseling. Using  these two strategies together makes you more likely to succeed in quitting than if you used either strategy on its own. If you are pregnant or breastfeeding, talk with your health care provider about finding counseling or other support strategies to quit smoking. Do not take medicine to help you quit smoking unless told to do so by your health care provider. WHAT THINGS CAN I DO TO MAKE IT EASIER TO QUIT? Quitting smoking might feel overwhelming at first, but there is a lot that you can do to make it easier. Take these important actions:  Reach out to your family and friends and ask that they support and encourage you during this time. Call telephone quitlines, reach out to support groups, or work with a counselor for support.  Ask people who smoke to avoid smoking around you.  Avoid places that trigger you to smoke, such as bars, parties, or smoke-break areas at work.  Spend time around people who do not smoke.  Lessen stress in your life, because stress can be a smoking trigger for some people. To lessen stress, try:  Exercising regularly.  Deep-breathing exercises.  Yoga.  Meditating.  Performing a body scan. This involves closing your eyes, scanning your body from head to toe, and noticing which parts of your body are particularly tense. Purposefully relax the muscles in those areas.  Download or purchase mobile phone or tablet apps (applications) that can help you stick to your quit plan by providing reminders, tips, and encouragement. There are many free apps, such as QuitGuide from the [employer] Office manager for Disease Control and Prevention). You can find other support for quitting smoking (smoking cessation) through smokefree.gov and other websites. HOW WILL I FEEL WHEN I QUIT SMOKING? Within the first 24 hours of quitting smoking, you may start to feel some withdrawal symptoms. These symptoms are usually most noticeable 2-3 days after quitting, but they usually do not last beyond 2-3  weeks. Changes or symptoms that you might experience include:  Mood swings.  Restlessness, anxiety, or irritation.  Difficulty concentrating.  Dizziness.  Strong cravings for sugary foods in addition to nicotine.  Mild weight gain.  Constipation.  Nausea.  Coughing or a sore throat.  Changes in how your medicines work in your body.  A depressed mood.  Difficulty sleeping (insomnia). After the first 2-3 weeks of quitting, you may start to notice more positive results, such as:  Improved sense of smell and taste.  Decreased coughing and sore throat.  Slower heart rate.  Lower blood pressure.  Clearer skin.  The ability to breathe more easily.  Fewer sick days. Quitting smoking is very challenging for most people. Do not get discouraged if you are not successful the first time. Some people need to make many attempts to quit before they achieve long-term success. Do your best to stick to your quit plan, and talk with your health care provider if you have any questions or concerns.   This information is not intended to replace advice given to you by your health care provider. Make sure you discuss any questions you have with your health care provider.   Document Released: 09/07/2001 Document Revised: 01/28/2015 Document Reviewed: 01/28/2015 Elsevier  Interactive Patient Education 2016 Scott Rosario. Smoking Cessation, Tips for Success If you are ready to quit smoking, congratulations! You have chosen to help yourself be healthier. Cigarettes bring nicotine, tar, carbon monoxide, and other irritants into your body. Your lungs, heart, and blood vessels will be able to work better without these poisons. There are many different ways to quit smoking. Nicotine gum, nicotine patches, a nicotine inhaler, or nicotine nasal spray can help with physical craving. Hypnosis, support groups, and medicines help break the habit of smoking. WHAT THINGS CAN I DO TO MAKE QUITTING EASIER?   Here are some tips to help you quit for good:  Pick a date when you will quit smoking completely. Tell all of your friends and family about your plan to quit on that date.  Do not try to slowly cut down on the number of cigarettes you are smoking. Pick a quit date and quit smoking completely starting on that day.  Throw away all cigarettes.   Clean and remove all ashtrays from your home, work, and car.  On a card, write down your reasons for quitting. Carry the card with you and read it when you get the urge to smoke.  Cleanse your body of nicotine. Drink enough water and fluids to keep your urine clear or pale yellow. Do this after quitting to flush the nicotine from your body.  Learn to predict your moods. Do not let a bad situation be your excuse to have a cigarette. Some situations in your life might tempt you into wanting a cigarette.  Never have "just one" cigarette. It leads to wanting another and another. Remind yourself of your decision to quit.  Change habits associated with smoking. If you smoked while driving or when feeling stressed, try other activities to replace smoking. Stand up when drinking your coffee. Brush your teeth after eating. Sit in a different chair when you read the paper. Avoid alcohol while trying to quit, and try to drink fewer caffeinated beverages. Alcohol and caffeine may urge you to smoke.  Avoid foods and drinks that can trigger a desire to smoke, such as sugary or spicy foods and alcohol.  Ask people who smoke not to smoke around you.  Have something planned to do right after eating or having a cup of coffee. For example, plan to take a walk or exercise.  Try a relaxation exercise to calm you down and decrease your stress. Remember, you may be tense and nervous for the first 2 weeks after you quit, but this will pass.  Find new activities to keep your hands busy. Play with a pen, coin, or rubber band. Doodle or draw things on paper.  Brush your  teeth right after eating. This will help cut down on the craving for the taste of tobacco after meals. You can also try mouthwash.   Use oral substitutes in place of cigarettes. Try using lemon drops, carrots, cinnamon sticks, or chewing gum. Keep them handy so they are available when you have the urge to smoke.  When you have the urge to smoke, try deep breathing.  Designate your home as a nonsmoking area.  If you are a heavy smoker, ask your health care provider about a prescription for nicotine chewing gum. It can ease your withdrawal from nicotine.  Reward yourself. Set aside the cigarette money you save and buy yourself something nice.  Look for support from others. Join a support group or smoking cessation program. Ask someone at home or at  work to help you with your plan to quit smoking.  Always ask yourself, "Do I need this cigarette or is this just a reflex?" Tell yourself, "Today, I choose not to smoke," or "I do not want to smoke." You are reminding yourself of your decision to quit.  Do not replace cigarette smoking with electronic cigarettes (commonly called e-cigarettes). The safety of e-cigarettes is unknown, and some may contain harmful chemicals.  If you relapse, do not give up! Plan ahead and think about what you will do the next time you get the urge to smoke. HOW WILL I FEEL WHEN I QUIT SMOKING? You may have symptoms of withdrawal because your body is used to nicotine (the addictive substance in cigarettes). You may crave cigarettes, be irritable, feel very hungry, cough often, get headaches, or have difficulty concentrating. The withdrawal symptoms are only temporary. They are strongest when you first quit but will go away within 10-14 days. When withdrawal symptoms occur, stay in control. Think about your reasons for quitting. Remind yourself that these are signs that your body is healing and getting used to being without cigarettes. Remember that withdrawal symptoms are  easier to treat than the major diseases that smoking can cause.  Even after the withdrawal is over, expect periodic urges to smoke. However, these cravings are generally short lived and will go away whether you smoke or not. Do not smoke! WHAT RESOURCES ARE AVAILABLE TO HELP ME QUIT SMOKING? Your health care provider can direct you to community resources or hospitals for support, which may include:  Group support.  Education.  Hypnosis.  Therapy.   This information is not intended to replace advice given to you by your health care provider. Make sure you discuss any questions you have with your health care provider.   Document Released: 06/11/2004 Document Revised: 10/04/2014 Document Reviewed: 03/01/2013 Elsevier Interactive Patient Education [employer].

## 2016-04-09 NOTE — Progress Notes (Signed)
BP 112/60 mmHg  Pulse 79  Temp(Src) 97.6 F (36.4 C) (Oral)  Resp 16  Wt 189 lb (85.73 kg)  SpO2 97%   Subjective:    Patient ID: Scott Rosario, male    DOB: 01-Aug-1961, 55 y.o.   MRN: [id number]  HPI: Scott Rosario is a 55 y.o. male  Chief Complaint  Patient presents with  . Follow-up   He went to the hospital on June 9th, admitted with chest pain, elevated troponin I, had stent placed  Started on June 9th with tightness and pain in the chest; left arm and hand went numb; he had just gotten off the tractor; chest was killing me he says; previous heart attack 12 years before It like a cramp, tried to stretch, he knew what it was; drove himself to EMS, less than a mile; told them he thinks he is having a heart attack; did all the monitors and got him to the hospital Stent placed; cardiologist is Dr. 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, pulling concrete, got finished, then chest was killing him just like June 9th; he took out the nitro, never had to use, took one and five minutes the pain was gone; he was reaching out and working his upper chest muscles; that was two days ago; with that chest pain, no SHOB, no nausea; extremely hot that day; he took some water too and every time he drank water, it eased it off; did not break out into a cold sweat, but was sweating like a dog that day Taking aspirin and plavix, has not missed any doses since leaving the hospital  He is working on quitting smoking, but just not quite there yet; he is respectful of others and does not smokea round his pastor or others; he is interested in Chantix; I asked why he smokes; he smokes because it relaxes him; other people might drink a beer, but he enjoys the smoking; he has quit 3 times; it's not that he can't quit, it's in the mind; the last 2 times he quit, he was in the hospital; he  would pick it up back up because of stress; he chewed gum until jaws got sore; son was going to quit too; he will not smoke around son and vice versa  Relevant past medical, surgical, family and social history reviewed Past Medical History  Diagnosis Date  . Heart attack ([place])   . Asthma   . Emphysema of lung ([place])   . COPD (chronic obstructive pulmonary disease) ([place])   . Diabetes mellitus without complication ([place])   . GERD (gastroesophageal reflux disease)   . Hyperlipidemia   . Hypertension   . Tobacco use   . Pulmonary nodules     x 3, (8mm, 18mm, 46mm) chest CT February 25, 2014  . CAD (coronary artery disease)   . Cirrhosis ([place])   . Alkaline phosphatase elevation   . Aortic atherosclerosis ([place])     on CT 02/25/14  . Renal cyst     on CT 02/25/14   Past Surgical History  Procedure Laterality Date  . Coronary angioplasty with stent placement  10/25/2003    x 2  . Appendectomy    . Cardiac catheterization Left 03/09/2016    Procedure: Left Heart Cath and Coronary Angiography;  Surgeon: Corey Skains, MD;  Location: [facility];  Service: Cardiovascular;  Laterality:  Left;  . Cardiac catheterization N/A 03/09/2016    Procedure: Coronary Stent Intervention;  Surgeon: Isaias Cowman, MD;  Location: [facility];  Service: Cardiovascular;  Laterality: N/A;   Family History  Problem Relation Age of Onset  . Diabetes Mother   . Hypertension Mother   . Arthritis Mother   . Hyperlipidemia Mother   . Heart attack Father   . Epilepsy Father   . Diabetes Father   . Seizures Father   . Heart disease Father   . Hyperlipidemia Father   . Hypertension Father   . Stroke Father    Social History  Substance Use Topics  . Smoking status: Current Every Day Smoker -- 1.00 packs/day for 40 years    Types: Cigarettes  . Smokeless tobacco: Never Used  . Alcohol Use: No   Interim medical history since last visit reviewed. Allergies and medications reviewed  Review  of Systems Per HPI unless specifically indicated above     Objective:    BP 112/60 mmHg  Pulse 79  Temp(Src) 97.6 F (36.4 C) (Oral)  Resp 16  Wt 189 lb (85.73 kg)  SpO2 97%  Wt Readings from Last 3 Encounters:  04/09/16 189 lb (85.73 kg)  03/09/16 191 lb (86.637 kg)  03/05/16 191 lb 12.8 oz (87 kg)   body mass index is 28.74 kg/(m^2).  Physical Exam  Constitutional: He appears well-developed and well-nourished. No distress.  Weight down two pounds in one month; overweight  HENT:  Head: Normocephalic and atraumatic.  Eyes: EOM are normal. No scleral icterus.  Neck: No thyromegaly present.  Cardiovascular: Normal rate and regular rhythm.   Pulmonary/Chest: Effort normal and breath sounds normal. He exhibits tenderness (very slight tenderness along left side costochondral border).  Abdominal: Soft. He exhibits no distension.  Musculoskeletal: He exhibits no edema.  Neurological: Coordination normal.  Skin: Skin is warm and dry. No pallor.  Ruddy, tanned complexion  Psychiatric: He has a normal mood and affect. His behavior is normal. Judgment and thought content normal. His mood appears not anxious. He does not exhibit a depressed mood.   Results for orders placed or performed during the hospital encounter of [id number]  Basic metabolic panel  Result Value Ref Range   Sodium 137 135 - 145 mmol/L   Potassium 4.4 3.5 - 5.1 mmol/L   Chloride 108 101 - 111 mmol/L   CO2 25 22 - 32 mmol/L   Glucose, Bld 151 (H) 65 - 99 mg/dL   BUN 16 6 - 20 mg/dL   Creatinine, Ser 0.77 0.61 - 1.24 mg/dL   Calcium 8.5 (L) 8.9 - 10.3 mg/dL   GFR calc non Af Amer >60 >60 mL/min   GFR calc Af Amer >60 >60 mL/min   Anion gap 4 (L) 5 - 15  CBC  Result Value Ref Range   WBC 9.2 3.8 - 10.6 K/uL   RBC 4.72 4.40 - 5.90 MIL/uL   Hemoglobin 14.1 13.0 - 18.0 g/dL   HCT 40.7 40.0 - 52.0 %   MCV 86.2 80.0 - 100.0 fL   MCH 29.8 26.0 - 34.0 pg   MCHC 34.6 32.0 - 36.0 g/dL   RDW 13.3 11.5 - 14.5 %    Platelets 165 150 - 440 K/uL      Assessment & Plan:   Problem List Items Addressed This Visit      Cardiovascular and Mediastinum   Hypertension    Well-controlled      Relevant Medications  nitroGLYCERIN (NITROSTAT) 0.4 MG SL tablet   isosorbide mononitrate (IMDUR) 30 MG 24 hr tablet   CAD (coronary artery disease) - Primary    Small vessel disease; discussed case with cardiologist; continue aspirin and plavix and statin; smoking cessation is paramount; patient to use NTG if chest pain recurs and contact cardiologist; patient has appt with cardiologist in a few weeks; last LDL under 70      Relevant Medications   nitroGLYCERIN (NITROSTAT) 0.4 MG SL tablet   isosorbide mononitrate (IMDUR) 30 MG 24 hr tablet     Respiratory   COPD (chronic obstructive pulmonary disease) ([place])    Urged patient to quit smoking        Other   Tobacco use    Urged patient to quit; discussed risk of Chantix with cardiologist and he agrees, not a good candidate for Chantix (cardiovascular risk); urged patient to pick a quit date (today or Saturday or Sunday); consider quitting together with his son; spend a day together over the weekend, fishing, in a boat, away from cigarettes, positive reinforcement, etc.; numbers to QUIT-NOW line and smoking cessation classes at the hospital given; see AVS; greater than 3 minutes spent counseling for smoking cessation      Hyperlipidemia    Last LDL under 70      Relevant Medications   nitroGLYCERIN (NITROSTAT) 0.4 MG SL tablet   isosorbide mononitrate (IMDUR) 30 MG 24 hr tablet   Chest pain    Episode following exertion two days ago; talked with cardiologist; patient has small vessel disease, less likely to restenose after stenting; use NTG if needed and contact cardiologist if recurs; patient agrees, and will see cardiologist in the office in a few weeks         Follow up plan: No Follow-up on file. --> keep next regularly scheduled appt  An  after-visit summary was printed and given to the patient at [facility].  Please see the patient instructions which may contain other information and recommendations beyond what is mentioned above in the assessment and plan.  Meds ordered this encounter  Medications  . INVOKANA 300 MG TABS tablet    Sig:     Refill:  1  . pantoprazole (PROTONIX) 40 MG tablet    Sig:     Refill:  6  . nitroGLYCERIN (NITROSTAT) 0.4 MG SL tablet    Sig:     Refill:  0  . isosorbide mononitrate (IMDUR) 30 MG 24 hr tablet    Sig:     Refill:  0

## 2016-04-09 NOTE — Assessment & Plan Note (Signed)
Episode following exertion two days ago; talked with cardiologist; patient has small vessel disease, less likely to restenose after stenting; use NTG if needed and contact cardiologist if recurs; patient agrees, and will see cardiologist in the office in a few weeks

## 2016-04-09 NOTE — Assessment & Plan Note (Signed)
Last LDL under 70

## 2016-04-14 ENCOUNTER — Other Ambulatory Visit: Payer: Self-pay | Admitting: Unknown Physician Specialty

## 2016-04-14 MED ORDER — ATORVASTATIN CALCIUM 80 MG PO TABS: 80.0000 mg | ORAL_TABLET | Freq: Every day | ORAL | Status: DC

## 2016-04-14 NOTE — Telephone Encounter (Signed)
Thank you, Dr. Allen Norris ----------------------------------- Scott Rosario, Please call patient and let him know we'll have him continue the cholesterol medicine for now and make sure he sees Dr. Allen Norris soon Rx sent

## 2016-04-14 NOTE — Telephone Encounter (Signed)
I reviewed the chart; last liver enzymes showed elevation of alk phos and bilirubin and GI note mentioned in December that patient may need a liver biopsy I talked with patient; he has not had the liver biopsy; I urged him to call his GI doctor to see about getting this done, and I'll send a note to GI to see if atorvastatin 80 mg at bedtime is still best for him, given recent cath and stent June 2017 --------------------------- Dr. Allen Norris, Particia Nearing. Patient is supposed to be on atoravastatin 80 mg for cardiac issues (recent stent in June 2017). However, I see the note from earlier that patient needs liver biopsy. I just want to find out if patient still needs liver biopsy, and if so, should patient's statin be reduced or stopped until those results are back? He appears to have cirrhosis with normal SGPT, but not sure if "normal" SGPT reflects normal function and ability to clear statin. Thank you, Enid Derry, MD

## 2016-04-14 NOTE — Telephone Encounter (Signed)
Your patient.  Thanks 

## 2016-04-14 NOTE — Telephone Encounter (Signed)
Thank you very much for the note on this patient. I think that the benefit outweighs the risk for this medication on this patient. I agree he should follow-up with me to find out the cause of his persistent abnormal liver enzymes.

## 2016-05-07 ENCOUNTER — Other Ambulatory Visit: Payer: Self-pay | Admitting: Family Medicine

## 2016-05-07 DIAGNOSIS — R0683 Snoring: Secondary | ICD-10-CM | POA: Insufficient documentation

## 2016-05-07 LAB — HM DIABETES EYE EXAM

## 2016-05-07 NOTE — Assessment & Plan Note (Signed)
Refer to Dr. Stevenson Clinch

## 2016-05-13 ENCOUNTER — Telehealth: Payer: Self-pay | Admitting: Family Medicine

## 2016-05-17 NOTE — Telephone Encounter (Signed)
I called, left brief msg that I was returning her call

## 2016-05-21 ENCOUNTER — Other Ambulatory Visit: Payer: Self-pay | Admitting: Family Medicine

## 2016-05-21 ENCOUNTER — Ambulatory Visit: Payer: BLUE CROSS/BLUE SHIELD | Admitting: Internal Medicine

## 2016-05-24 NOTE — Telephone Encounter (Signed)
I talked with pt then pt's wife He has cirrhosis, but wife isn't sure if everything is finished and okay I asked her to please call Dr. Dorothey Baseman office to see if labs and fibrosis scan were done, if any further testing needed

## 2016-05-31 ENCOUNTER — Other Ambulatory Visit: Payer: Self-pay | Admitting: Family Medicine

## 2016-07-26 ENCOUNTER — Other Ambulatory Visit: Payer: Self-pay | Admitting: Family Medicine

## 2016-07-26 DIAGNOSIS — Z5181 Encounter for therapeutic drug level monitoring: Secondary | ICD-10-CM

## 2016-07-26 DIAGNOSIS — E782 Mixed hyperlipidemia: Secondary | ICD-10-CM

## 2016-07-26 DIAGNOSIS — E119 Type 2 diabetes mellitus without complications: Secondary | ICD-10-CM

## 2016-07-26 NOTE — Assessment & Plan Note (Signed)
Check labs 

## 2016-07-26 NOTE — Telephone Encounter (Signed)
Left detailed vociemail 

## 2016-07-26 NOTE — Telephone Encounter (Signed)
I approved refill, but would like patient to go for labs in the next week or two (fasting) please Thank you

## 2016-08-03 ENCOUNTER — Other Ambulatory Visit: Payer: Self-pay | Admitting: Family Medicine

## 2016-08-09 ENCOUNTER — Other Ambulatory Visit: Payer: Self-pay | Admitting: Family Medicine

## 2016-08-13 ENCOUNTER — Telehealth: Payer: Self-pay | Admitting: Family Medicine

## 2016-08-13 MED ORDER — PANTOPRAZOLE SODIUM 40 MG PO TBEC: 40.0000 mg | DELAYED_RELEASE_TABLET | Freq: Every day | ORAL | 1 refills | Status: DC

## 2016-08-13 NOTE — Telephone Encounter (Signed)
rx sent

## 2016-08-13 NOTE — Telephone Encounter (Signed)
Pt is needing refill on his heart burn medication. He said they keep sending the refill request to [facility] calling them and he tells them that you are not there anymore. Could you please refill this for him.

## 2016-08-23 ENCOUNTER — Ambulatory Visit (INDEPENDENT_AMBULATORY_CARE_PROVIDER_SITE_OTHER): Payer: BLUE CROSS/BLUE SHIELD | Admitting: Family Medicine

## 2016-08-23 ENCOUNTER — Encounter: Payer: Self-pay | Admitting: Family Medicine

## 2016-08-23 VITALS — BP 112/78 | HR 73 | Temp 98.2°F | Resp 14 | Ht 68.0 in | Wt 193.3 lb

## 2016-08-23 DIAGNOSIS — R918 Other nonspecific abnormal finding of lung field: Secondary | ICD-10-CM

## 2016-08-23 DIAGNOSIS — J449 Chronic obstructive pulmonary disease, unspecified: Secondary | ICD-10-CM

## 2016-08-23 DIAGNOSIS — Z5181 Encounter for therapeutic drug level monitoring: Secondary | ICD-10-CM

## 2016-08-23 DIAGNOSIS — I2510 Atherosclerotic heart disease of native coronary artery without angina pectoris: Secondary | ICD-10-CM

## 2016-08-23 DIAGNOSIS — E782 Mixed hyperlipidemia: Secondary | ICD-10-CM | POA: Diagnosis not present

## 2016-08-23 DIAGNOSIS — E119 Type 2 diabetes mellitus without complications: Secondary | ICD-10-CM | POA: Diagnosis not present

## 2016-08-23 DIAGNOSIS — Z720 Tobacco use: Secondary | ICD-10-CM | POA: Diagnosis not present

## 2016-08-23 DIAGNOSIS — K7469 Other cirrhosis of liver: Secondary | ICD-10-CM | POA: Diagnosis not present

## 2016-08-23 DIAGNOSIS — I10 Essential (primary) hypertension: Secondary | ICD-10-CM | POA: Diagnosis not present

## 2016-08-23 DIAGNOSIS — I2583 Coronary atherosclerosis due to lipid rich plaque: Secondary | ICD-10-CM

## 2016-08-23 LAB — COMPLETE METABOLIC PANEL WITH GFR
ALT: 25 U/L (ref 9–46)
AST: 21 U/L (ref 10–35)
Albumin: 4.4 g/dL (ref 3.6–5.1)
Alkaline Phosphatase: 135 U/L — ABNORMAL HIGH (ref 40–115)
BILIRUBIN TOTAL: 1.3 mg/dL — AB (ref 0.2–1.2)
BUN: 17 mg/dL (ref 7–25)
CALCIUM: 9.1 mg/dL (ref 8.6–10.3)
CHLORIDE: 105 mmol/L (ref 98–110)
CO2: 29 mmol/L (ref 20–31)
Creat: 0.94 mg/dL (ref 0.70–1.33)
Glucose, Bld: 157 mg/dL — ABNORMAL HIGH (ref 65–99)
Potassium: 5.5 mmol/L — ABNORMAL HIGH (ref 3.5–5.3)
Sodium: 139 mmol/L (ref 135–146)
Total Protein: 6.5 g/dL (ref 6.1–8.1)

## 2016-08-23 LAB — LIPID PANEL
CHOLESTEROL: 117 mg/dL (ref <200)
HDL: 28 mg/dL — AB (ref >40)
LDL CALC: 64 mg/dL (ref <100)
TRIGLYCERIDES: 123 mg/dL (ref <150)
Total CHOL/HDL Ratio: 4.2 Ratio (ref <5.0)
VLDL: 25 mg/dL (ref <30)

## 2016-08-23 LAB — HEMOGLOBIN A1C
Hgb A1c MFr Bld: 6.9 % — ABNORMAL HIGH (ref <5.7)
MEAN PLASMA GLUCOSE: 151 mg/dL

## 2016-08-23 NOTE — Assessment & Plan Note (Signed)
Asymptomatic; goes to see Dr. Nehemiah Massed this week; continue meds; try to quit smoking

## 2016-08-23 NOTE — Patient Instructions (Signed)
Try to limit saturated fats in your diet (bologna, hot dogs, barbeque, cheeseburgers, hamburgers, steak, bacon, sausage, cheese, etc.) and get more fresh fruits, vegetables, and whole grains I do encourage you to quit smoking Call [phone] to sign up for smoking cessation classes You can call 1-800-QUIT-NOW to talk with a smoking cessation coach

## 2016-08-23 NOTE — Assessment & Plan Note (Signed)
Unchanged on last CT scan; due for next for smoking monitoring in June 2018

## 2016-08-23 NOTE — Assessment & Plan Note (Signed)
Check lipids; avoid foods with saturated fats

## 2016-08-23 NOTE — Assessment & Plan Note (Signed)
Monitored by Dr. Allen Norris

## 2016-08-23 NOTE — Assessment & Plan Note (Signed)
Continue meds; avoid salt, follow the DASH guidelines

## 2016-08-23 NOTE — Assessment & Plan Note (Signed)
Urged pt to quit

## 2016-08-23 NOTE — Assessment & Plan Note (Signed)
Urged pt to quit smoking

## 2016-08-23 NOTE — Progress Notes (Signed)
BP 112/78 (BP Location: Left Arm, Patient Position: Sitting, Cuff Size: Normal)   Pulse 73   Temp 98.2 F (36.8 C) (Oral)   Resp 14   Ht 5\' 8"  (1.727 m)   Wt 193 lb 5 oz (87.7 kg)   SpO2 97%   BMI 29.39 kg/m    Subjective:    Patient ID: Scott Rosario, male    DOB: 31-May-1961, 55 y.o.   MRN: [id number]  HPI: Scott Rosario is a 55 y.o. male  Chief Complaint  Patient presents with  . Follow-up    6 mnth F/U and fasting labs   Type 2 diabetes mellitus; not checking sugars; drinks diet Mt Dew; some gatorade; no regular sugary soft drinks; room for improvement in diet; does drink sweet tea, going out to eat, not much; no problems with feet; last eye exam UTD; just recently July or August   HTN; well-controlled; trying to stay away from extra salt  CAD; on toprol, plavix, imdur; sees Dr. Nehemiah Massed, cardiologist; goes to see him Wednesday; no chest pain; no SHOB or DOE  Tobacco abuse; still a pack a day; not ready to quit right now  He has not been back to see Dr. Allen Norris about his liver; he says they called and said everything was good; even seen by Dr. Luan Pulling years ago  He has back issues from time to time; has a back brace and it helps if he puts it on before he starts working; not doing core exercises  Depression screen [facility] 2/9 08/23/2016  Decreased Interest 0  Down, Depressed, Hopeless 0  PHQ - 2 Score 0    No flowsheet data found.  Relevant past medical, surgical, family and social history reviewed Past Medical History:  Diagnosis Date  . Alkaline phosphatase elevation   . Aortic atherosclerosis ([place])    on CT 02/25/14  . Asthma   . CAD (coronary artery disease)   . Cirrhosis ([place])   . COPD (chronic obstructive pulmonary disease) ([place])   . Diabetes mellitus without complication ([place])   . Emphysema of lung ([place])   . GERD (gastroesophageal reflux disease)   . Heart attack   . Hyperlipidemia   . Hypertension   . Pulmonary nodules    x 3, (77mm, 23mm,  5mm) chest CT February 25, 2014  . Renal cyst    on CT 02/25/14  . Tobacco use    Past Surgical History:  Procedure Laterality Date  . APPENDECTOMY    . CARDIAC CATHETERIZATION Left 03/09/2016   Procedure: Left Heart Cath and Coronary Angiography;  Surgeon: Corey Skains, MD;  Location: [facility];  Service: Cardiovascular;  Laterality: Left;  . CARDIAC CATHETERIZATION N/A 03/09/2016   Procedure: Coronary Stent Intervention;  Surgeon: Isaias Cowman, MD;  Location: [facility];  Service: Cardiovascular;  Laterality: N/A;  . CORONARY ANGIOPLASTY WITH STENT PLACEMENT  10/25/2003   x 2   Family History  Problem Relation Age of Onset  . Diabetes Mother   . Hypertension Mother   . Arthritis Mother   . Hyperlipidemia Mother   . Heart attack Father   . Epilepsy Father   . Diabetes Father   . Seizures Father   . Heart disease Father   . Hyperlipidemia Father   . Hypertension Father   . Stroke Father    Social History  Substance Use Topics  . Smoking status: Current Every Day Smoker    Packs/day: 1.00    Years:  40.00    Types: Cigarettes  . Smokeless tobacco: Never Used  . Alcohol use No    Interim medical history since last visit reviewed. Allergies and medications reviewed  Review of Systems Per HPI unless specifically indicated above     Objective:    BP 112/78 (BP Location: Left Arm, Patient Position: Sitting, Cuff Size: Normal)   Pulse 73   Temp 98.2 F (36.8 C) (Oral)   Resp 14   Ht 5\' 8"  (1.727 m)   Wt 193 lb 5 oz (87.7 kg)   SpO2 97%   BMI 29.39 kg/m   Wt Readings from Last 3 Encounters:  08/23/16 193 lb 5 oz (87.7 kg)  04/09/16 189 lb (85.7 kg)  03/09/16 191 lb (86.6 kg)    Physical Exam  Constitutional: He appears well-developed and well-nourished. No distress.  Weight down two pounds in one month; overweight  HENT:  Head: Normocephalic and atraumatic.  Eyes: EOM are normal. No scleral icterus.  Neck: No thyromegaly present.    Cardiovascular: Normal rate and regular rhythm.   Pulmonary/Chest: Effort normal and breath sounds normal. No respiratory distress. He has no wheezes.  Abdominal: Soft. Bowel sounds are normal. He exhibits no distension.  Musculoskeletal: He exhibits no edema.  Neurological: Coordination normal.  Skin: Skin is warm and dry. No pallor.  Ruddy, tanned complexion  Psychiatric: He has a normal mood and affect. His behavior is normal. Judgment and thought content normal. His mood appears not anxious. He does not exhibit a depressed mood.   Diabetic Foot Form - Detailed   Diabetic Foot Exam - detailed Diabetic Foot exam was performed with the following findings:  Yes 08/23/2016  8:23 AM  Visual Foot Exam completed.:  Yes  Are the toenails ingrown?:  No Normal Range of Motion:  Yes Pulse Foot Exam completed.:  Yes  Right Dorsalis Pedis:  Present Left Dorsalis Pedis:  Present  Sensory Foot Exam Completed.:  Yes Semmes-Weinstein Monofilament Test R Site 1-Great Toe:  Pos L Site 1-Great Toe:  Pos  R Site 4:  Pos L Site 4:  Pos  R Site 5:  Pos L Site 5:  Pos        Results for orders placed or performed in visit on 05/14/16  HM DIABETES EYE EXAM  Result Value Ref Range   HM Diabetic Eye Exam No Retinopathy No Retinopathy      Assessment & Plan:   Problem List Items Addressed This Visit      Cardiovascular and Mediastinum   Hypertension (Chronic)    Continue meds; avoid salt, follow the DASH guidelines      CAD (coronary artery disease) (Chronic)    Asymptomatic; goes to see Dr. Nehemiah Massed this week; continue meds; try to quit smoking        Respiratory   COPD (chronic obstructive pulmonary disease) ([place]) (Chronic)    Urged pt to quit smoking        Digestive   Cirrhosis ([place]) (Chronic)    Monitored by Dr. Allen Norris        Endocrine   Diabetes mellitus without complication ([place]) - Primary (Chronic)    Foot exam by MD; encouraged pt to cut back on simple carbs; eye exam  UTD      Relevant Orders   HgB A1c     Other   Tobacco use (Chronic)    Urged pt to quit      Multiple pulmonary nodules determined by computed tomography of lung (Chronic)  Unchanged on last CT scan; due for next for smoking monitoring in June 2018      Medication monitoring encounter    Check labs      Relevant Orders   COMPLETE METABOLIC PANEL WITH GFR   Hyperlipidemia (Chronic)    Check lipids; avoid foods with saturated fats      Relevant Orders   Lipid panel    Other Visit Diagnoses    Encounter for therapeutic drug monitoring          Follow up plan: Return in about 6 months (around 02/20/2017) for follow-up and fasting labs.  An after-visit summary was printed and given to the patient at [facility].  Please see the patient instructions which may contain other information and recommendations beyond what is mentioned above in the assessment and plan. Old labs drawn today: CMP, A1c, lipids

## 2016-08-23 NOTE — Assessment & Plan Note (Signed)
Foot exam by MD; encouraged pt to cut back on simple carbs; eye exam UTD

## 2016-08-23 NOTE — Assessment & Plan Note (Signed)
Check labs 

## 2016-08-25 ENCOUNTER — Encounter: Payer: Self-pay | Admitting: Gastroenterology

## 2016-08-25 NOTE — Progress Notes (Signed)
Dr. Sanda Klein,  Hi, Just got the labs faxed to me. Last noted appears that the patient states that we told him everything was fine. In fact we had sent him a letter to follow-up which she did not do. The patient also was sent for multiple imaging studies that appear not to have been done. Lucilla Lame, MD  ---------------------------------  Thank you I called patient, talked to him briefly then phone call was dropped (cell). I called back and left detailed message that I'd like him to contact Dr. Dorothey Baseman office to schedule an appointment. There is still work-up to be done and monitoring for his liver, so please contact Dr. Dorothey Baseman office to make an appointment. Call me with any issues or questions. Dr. Sanda Klein

## 2016-09-06 ENCOUNTER — Other Ambulatory Visit: Payer: Self-pay

## 2016-09-06 ENCOUNTER — Other Ambulatory Visit: Payer: Self-pay | Admitting: Family Medicine

## 2016-09-06 DIAGNOSIS — E782 Mixed hyperlipidemia: Secondary | ICD-10-CM

## 2016-09-06 DIAGNOSIS — K7469 Other cirrhosis of liver: Secondary | ICD-10-CM

## 2016-09-06 NOTE — Telephone Encounter (Signed)
Referral sent (pending) sent Dr. Durwin Reges message regarding chol med

## 2016-09-06 NOTE — Telephone Encounter (Signed)
Please contact patient We need for him to please schedule an appointment with Dr. Allen Norris, GI, about his liver Then please contact Dr. Dorothey Baseman office and ask if he thinks it is appropriate to continue Lipitor at 80 mg daily or if he would like me to reduce this until he can be seen and have further testing Thank you

## 2016-09-08 NOTE — Telephone Encounter (Signed)
Please check with GI again; thank you

## 2016-09-09 NOTE — Telephone Encounter (Signed)
Patient has not seen him yet (not a current patient) do you still want me to call and ask opinion about chol med?

## 2016-09-09 NOTE — Telephone Encounter (Signed)
Scott Rosario has seen Dr. Allen Norris (November 2016) and is supposed to go back for further testing. He is an established patient. I just need to know as soon as possible if Dr. Nilda Calamity continuing 80 mg of lipitor post MI. Thank you

## 2016-09-10 NOTE — Telephone Encounter (Signed)
I personally called Dr. Allen Norris He says it's fine to continue the atorvastatin 80 Rx sent

## 2016-09-27 DIAGNOSIS — J189 Pneumonia, unspecified organism: Secondary | ICD-10-CM

## 2016-09-27 HISTORY — DX: Pneumonia, unspecified organism: J18.9

## 2016-10-07 ENCOUNTER — Encounter: Payer: Self-pay | Admitting: Gastroenterology

## 2016-10-07 ENCOUNTER — Ambulatory Visit (INDEPENDENT_AMBULATORY_CARE_PROVIDER_SITE_OTHER): Payer: BLUE CROSS/BLUE SHIELD | Admitting: Gastroenterology

## 2016-10-07 VITALS — BP 134/83 | HR 83 | Temp 98.7°F | Ht 68.0 in | Wt 195.5 lb

## 2016-10-07 DIAGNOSIS — R748 Abnormal levels of other serum enzymes: Secondary | ICD-10-CM | POA: Diagnosis not present

## 2016-10-07 NOTE — Progress Notes (Signed)
Primary Care Physician: Enid Derry, MD  Primary Gastroenterologist:  Dr. Lucilla Lame  Chief Complaint  Patient presents with  . Elevated Hepatic Enzymes    HPI: Scott Rosario is a 56 y.o. male here for follow-up of his abnormal liver enzymes. The patient also had an ultrasound in the past showing a nodular liver. The patient was seen by me in the past and was recommended to have a fibrosis scan and an ultrasound. The patient never went through with these tests. The patient has had chronically elevated bilirubin and alkaline phosphatase with normal AST and ALT. The fractionation of the bilirubin showed it to be indirect consistent with a non-liver cause of his increased bilirubin. The patient continues to smoke.  Current Outpatient Prescriptions  Medication Sig Dispense Refill  . aspirin EC 81 MG tablet Take 81 mg by mouth daily.    Marland Kitchen atorvastatin (LIPITOR) 80 MG tablet TAKE 1 TABLET BY MOUTH AT BEDTIME 30 tablet 5  . clopidogrel (PLAVIX) 75 MG tablet Take 75 mg by mouth daily.     Marland Kitchen glipiZIDE (GLUCOTROL XL) 10 MG 24 hr tablet TAKE 1 TABLET BY MOUTH ONCE DAILY. 30 tablet 5  . INVOKANA 300 MG TABS tablet TAKE 1 TABLET BY MOUTH ONCE DAILY BEFOREBREAKFAST 30 tablet 5  . losartan (COZAAR) 50 MG tablet TAKE 1 TABLET BY MOUTH ONCE DAILY. 30 tablet 2  . metoprolol succinate (TOPROL-XL) 25 MG 24 hr tablet TAKE 1 TABLET BY MOUTH ONCE DAILY. 30 tablet 5  . nitroGLYCERIN (NITROSTAT) 0.4 MG SL tablet   0  . pantoprazole (PROTONIX) 40 MG tablet Take 1 tablet (40 mg total) by mouth daily. 30 tablet 1   No current facility-administered medications for this visit.     Allergies as of 10/07/2016  . (No Known Allergies)    ROS:  General: Negative for anorexia, weight loss, fever, chills, fatigue, weakness. ENT: Negative for hoarseness, difficulty swallowing , nasal congestion. CV: Negative for chest pain, angina, palpitations, dyspnea on exertion, peripheral edema.  Respiratory: Negative  for dyspnea at rest, dyspnea on exertion, cough, sputum, wheezing.  GI: See history of present illness. GU:  Negative for dysuria, hematuria, urinary incontinence, urinary frequency, nocturnal urination.  Endo: Negative for unusual weight change.    Physical Examination:   BP 134/83   Pulse 83   Temp 98.7 F (37.1 C)   Ht 5\' 8"  (1.727 m)   Wt 195 lb 8 oz (88.7 kg)   BMI 29.73 kg/m   General: Well-nourished, well-developed in no acute distress.  Eyes: No icterus. Conjunctivae pink. Mouth: Oropharyngeal mucosa moist and pink , no lesions erythema or exudate. Lungs: Clear to auscultation bilaterally. Non-labored. Heart: Regular rate and rhythm, no murmurs rubs or gallops.  Abdomen: Bowel sounds are normal, nontender, nondistended, no hepatosplenomegaly or masses, no abdominal bruits or hernia , no rebound or guarding.   Extremities: No lower extremity edema. No clubbing or deformities. Neuro: Alert and oriented x 3.  Grossly intact. Skin: Warm and dry, no jaundice.   Psych: Alert and cooperative, normal mood and affect.  Labs:    Imaging Studies: No results found.  Assessment and Plan:   Scott Rosario is a 56 y.o. y/o male who has persistent elevation of his alkaline phosphatase and bilirubin. The bilirubin elevation is mostly indirect which is consistent with this patient having Gilbert's disease. No further workup for the bilirubin need to be done. The patient's platelets have been normal which is against him having portal  hypertension but his alkaline phosphatase is elevated and he has a history of an ultrasound with nodularity. The patient will again be set up for a fibrosis scan and a repeat ultrasound since his last ultrasound was many years ago. The patient will also have his blood sent off for GGT, repeat LFTs and fractionation of the alkaline phosphatase to make sure it is from a liver source and not a bone source. The patient has been explained the plan and agrees with  it.    Lucilla Lame, MD. Marval Regal   Note: This dictation was prepared with Dragon dictation along with smaller phrase technology. Any transcriptional errors that result from this process are unintentional.

## 2016-10-11 ENCOUNTER — Other Ambulatory Visit: Payer: Self-pay | Admitting: Family Medicine

## 2016-10-11 LAB — ALKALINE PHOSPHATASE, ISOENZYMES
BONE FRACTION: 26 % (ref 12–68)
INTESTINAL FRAC.: 16 % (ref 0–18)
LIVER FRACTION: 58 % (ref 13–88)

## 2016-10-11 LAB — HEPATIC FUNCTION PANEL
ALK PHOS: 141 IU/L — AB (ref 39–117)
ALT: 30 IU/L (ref 0–44)
AST: 21 IU/L (ref 0–40)
Albumin: 4.3 g/dL (ref 3.5–5.5)
BILIRUBIN, DIRECT: 0.2 mg/dL (ref 0.00–0.40)
Bilirubin Total: 0.7 mg/dL (ref 0.0–1.2)
Total Protein: 6.5 g/dL (ref 6.0–8.5)

## 2016-10-11 LAB — GAMMA GT: GGT: 84 IU/L — ABNORMAL HIGH (ref 0–65)

## 2016-10-13 NOTE — Telephone Encounter (Signed)
Please see my notes at the top of the [place] results from November; patient was supposed to have potassium redrawn; please order as requested and ask him to come in asap, weather permitting; I'll supply limited Rx in the meantime, but we really need to recheck his potassium; thank you

## 2016-10-18 ENCOUNTER — Telehealth: Payer: Self-pay

## 2016-10-18 NOTE — Telephone Encounter (Signed)
-----   Message from Lucilla Lame, MD sent at 10/17/2016  5:43 PM EST ----- Let the patient know that one of his liver enzymes have come back to normal but his other one still remains slightly elevated.  We are waiting for his fibrosis scan.

## 2016-10-18 NOTE — Telephone Encounter (Signed)
Left vm for pt to return my call.  

## 2016-10-18 NOTE — Telephone Encounter (Signed)
Pt notified of lab results

## 2016-10-19 ENCOUNTER — Ambulatory Visit
Admission: RE | Admit: 2016-10-19 | Discharge: 2016-10-19 | Disposition: A | Discharge status: Home / Self Care | Payer: BLUE CROSS/BLUE SHIELD | Source: Ambulatory Visit | Attending: Gastroenterology | Admitting: Gastroenterology

## 2016-10-19 DIAGNOSIS — R748 Abnormal levels of other serum enzymes: Secondary | ICD-10-CM | POA: Diagnosis not present

## 2016-10-19 DIAGNOSIS — B1920 Unspecified viral hepatitis C without hepatic coma: Secondary | ICD-10-CM | POA: Diagnosis not present

## 2016-10-19 DIAGNOSIS — K8020 Calculus of gallbladder without cholecystitis without obstruction: Secondary | ICD-10-CM | POA: Insufficient documentation

## 2016-10-22 ENCOUNTER — Telehealth: Payer: Self-pay

## 2016-10-22 NOTE — Telephone Encounter (Signed)
LVM for pt to return my call.

## 2016-10-22 NOTE — Telephone Encounter (Signed)
-----   Message from Lucilla Lame, MD sent at 10/20/2016 10:46 AM EST ----- Let the patient know that the ultrasound showed a fibrosis score of F2 with some F3. There was also gallstone seen which did not appear to be causing any problems

## 2016-10-26 ENCOUNTER — Other Ambulatory Visit: Payer: Self-pay | Admitting: Family Medicine

## 2016-10-27 NOTE — Telephone Encounter (Signed)
Please contact patient; we need tor recheck his potassium See previous note and enter orders please: Notes Recorded by Arnetha Courser, MD on 08/24/2016 at 11:54 AM EST Please contact patient; ask him to have his potassium redrawn today or tomorrow; I suspect it might be technique, but want to make sure (please order potassium, dx hyperkalemia) -----------------------------------------------------

## 2016-10-28 ENCOUNTER — Other Ambulatory Visit: Payer: Self-pay

## 2016-10-28 DIAGNOSIS — E875 Hyperkalemia: Secondary | ICD-10-CM

## 2016-10-28 DIAGNOSIS — E782 Mixed hyperlipidemia: Secondary | ICD-10-CM

## 2016-10-28 NOTE — Telephone Encounter (Signed)
Pt notified. He is coming in tomorrow morning.

## 2016-10-29 ENCOUNTER — Other Ambulatory Visit: Payer: Self-pay | Admitting: Family Medicine

## 2016-10-29 LAB — POTASSIUM: Potassium: 4.4 mmol/L (ref 3.5–5.3)

## 2016-10-29 MED ORDER — LOSARTAN POTASSIUM 50 MG PO TABS: 50.0000 mg | ORAL_TABLET | Freq: Every day | ORAL | 5 refills | Status: DC

## 2016-10-29 NOTE — Progress Notes (Signed)
Potasium recheck was fine; Rx for ARB approved

## 2016-11-04 ENCOUNTER — Other Ambulatory Visit: Payer: Self-pay | Admitting: Family Medicine

## 2016-11-05 NOTE — Telephone Encounter (Signed)
rx sent

## 2016-11-18 NOTE — Telephone Encounter (Signed)
Left vm again for pt to return my call. Mailed results to pt.

## 2016-12-27 ENCOUNTER — Ambulatory Visit: Payer: BLUE CROSS/BLUE SHIELD | Admitting: Family Medicine

## 2017-01-07 ENCOUNTER — Other Ambulatory Visit: Payer: Self-pay | Admitting: Family Medicine

## 2017-01-07 NOTE — Telephone Encounter (Signed)
Refills sent

## 2017-01-08 ENCOUNTER — Other Ambulatory Visit: Payer: Self-pay | Admitting: Family Medicine

## 2017-01-09 NOTE — Telephone Encounter (Signed)
I just refilled these on 01/07/17 (plavix and invokana), receipt confirmed by pharmacy Resolve with pharmacy please  INVOKANA 300 MG TABS tablet 30 tablet 0 01/07/2017    Sig: TAKE 1 TABLET BY MOUTH ONCE DAILY BEFOREBREAKFAST   Notes to Pharmacy: NEED ASAP   E-Prescribing Status: Receipt confirmed by pharmacy (01/07/2017 5:33 PM EDT)    pantoprazole (PROTONIX) 40 MG tablet 30 tablet 0 01/07/2017    Sig: TAKE 1 TABLET BY MOUTH ONCE DAILY   Notes to Pharmacy: NEED ASAP   E-Prescribing Status: Receipt confirmed by pharmacy (01/07/2017 5:32 PM EDT)

## 2017-01-24 ENCOUNTER — Ambulatory Visit (INDEPENDENT_AMBULATORY_CARE_PROVIDER_SITE_OTHER): Payer: Self-pay | Admitting: Family Medicine

## 2017-01-24 ENCOUNTER — Encounter: Payer: Self-pay | Admitting: Family Medicine

## 2017-01-24 VITALS — BP 138/74 | HR 86 | Temp 98.3°F | Resp 14 | Wt 195.0 lb

## 2017-01-24 DIAGNOSIS — R0683 Snoring: Secondary | ICD-10-CM

## 2017-01-24 DIAGNOSIS — E119 Type 2 diabetes mellitus without complications: Secondary | ICD-10-CM

## 2017-01-24 DIAGNOSIS — Z5181 Encounter for therapeutic drug level monitoring: Secondary | ICD-10-CM

## 2017-01-24 DIAGNOSIS — J438 Other emphysema: Secondary | ICD-10-CM

## 2017-01-24 DIAGNOSIS — R0681 Apnea, not elsewhere classified: Secondary | ICD-10-CM

## 2017-01-24 DIAGNOSIS — R748 Abnormal levels of other serum enzymes: Secondary | ICD-10-CM

## 2017-01-24 DIAGNOSIS — R5383 Other fatigue: Secondary | ICD-10-CM

## 2017-01-24 DIAGNOSIS — Z720 Tobacco use: Secondary | ICD-10-CM

## 2017-01-24 DIAGNOSIS — Z125 Encounter for screening for malignant neoplasm of prostate: Secondary | ICD-10-CM

## 2017-01-24 DIAGNOSIS — K7469 Other cirrhosis of liver: Secondary | ICD-10-CM

## 2017-01-24 NOTE — Assessment & Plan Note (Signed)
Managed by gastroenterologist 

## 2017-01-24 NOTE — Assessment & Plan Note (Signed)
Check liver and kidneys 

## 2017-01-24 NOTE — Patient Instructions (Addendum)
I do encourage you to quit smoking Call [phone] to sign up for smoking cessation classes You can call 1-800-QUIT-NOW to talk with a smoking cessation coach Return for labs tomorrow or Wednesday morning Please do see your eye doctor regularly, and have your eyes examined every year (or more often per his or her recommendation) Check your feet every night and let me know right away of any sores, infections, numbness, etc. Try to limit sweets, white bread, white rice, white potatoes It is okay with me for you to not check your fingerstick blood sugars (per [employer] of Endocrinology Best Practices), unless you are interested and feel it would be helpful for you We'll refer you to the lung doctor We'll have you get a chest CT in June

## 2017-01-24 NOTE — Assessment & Plan Note (Addendum)
Covered by gastroenterologist; will check alpha-1 AT

## 2017-01-24 NOTE — Assessment & Plan Note (Signed)
Not ready to quit, but I am here and ready to help

## 2017-01-24 NOTE — Assessment & Plan Note (Signed)
Refer to pulm

## 2017-01-24 NOTE — Assessment & Plan Note (Signed)
Refer to pulmonologist 

## 2017-01-24 NOTE — Assessment & Plan Note (Signed)
Foot exam by MD; trying to eat well; patient will see his eye doctor; return for fasting labs

## 2017-01-24 NOTE — Progress Notes (Signed)
BP 138/74   Pulse 86   Temp 98.3 F (36.8 C) (Oral)   Resp 14   Wt 195 lb (88.5 kg)   SpO2 96%   BMI 29.65 kg/m    Subjective:    Patient ID: Scott Rosario, male    DOB: 14-Feb-1961, 56 y.o.   MRN: [id number]  HPI: Scott Rosario is a 56 y.o. male  Chief Complaint  Patient presents with  . Follow-up   HPI Patient is here with his wife Coughing and spitting up phlegm; just depends, some days worse than others Bad allergies Grumpy, no energy Not sleeping well; ready to go to bed Last eye exam 2017, due now Not many sweets Meat eater; no hx of anemia; out in the sun, outside a lot; no blood loss Not waking up refreshed Spitting up a lot of mucous, fills up bottle; not having any night sweats, no weight loss Smoking 1 ppd; smokes first cig right away; last cig is 9 pm; not as much at home Not sure if he's been tested for alpha-1 AT deficiency He has had cirrhosis, liver issues for years; found by older Dr. Luan Pulling years ago Men die at early ages; men dying at 54 (father, massive MI), in the 64s He has a dark mole under the left armpit he wants me to look at  Depression screen [facility] 2/9 01/24/2017 08/23/2016  Decreased Interest 0 0  Down, Depressed, Hopeless 1 0  PHQ - 2 Score 1 0   Relevant past medical, surgical, family and social history reviewed Past Medical History:  Diagnosis Date  . Alkaline phosphatase elevation   . Aortic atherosclerosis ([place])    on CT 02/25/14  . Asthma   . CAD (coronary artery disease)   . Cirrhosis ([place])   . COPD (chronic obstructive pulmonary disease) ([place])   . Diabetes mellitus without complication ([place])   . Emphysema of lung ([place])   . GERD (gastroesophageal reflux disease)   . Heart attack ([place])   . Hyperlipidemia   . Hypertension   . Pulmonary nodules    x 3, (72mm, 36mm, 66mm) chest CT February 25, 2014  . Renal cyst    on CT 02/25/14  . Tobacco use    Past Surgical History:  Procedure Laterality Date  . APPENDECTOMY    .  CARDIAC CATHETERIZATION Left 03/09/2016   Procedure: Left Heart Cath and Coronary Angiography;  Surgeon: Corey Skains, MD;  Location: [facility];  Service: Cardiovascular;  Laterality: Left;  . CARDIAC CATHETERIZATION N/A 03/09/2016   Procedure: Coronary Stent Intervention;  Surgeon: Isaias Cowman, MD;  Location: [facility];  Service: Cardiovascular;  Laterality: N/A;  . CORONARY ANGIOPLASTY WITH STENT PLACEMENT  10/25/2003   x 2   Family History  Problem Relation Age of Onset  . Diabetes Mother   . Hypertension Mother   . Arthritis Mother   . Hyperlipidemia Mother   . Heart attack Father   . Epilepsy Father   . Diabetes Father   . Seizures Father   . Heart disease Father   . Hyperlipidemia Father   . Hypertension Father   . Stroke Father    Social History  Substance Use Topics  . Smoking status: Current Every Day Smoker    Packs/day: 1.00    Years: 40.00    Types: Cigarettes  . Smokeless tobacco: Never Used  . Alcohol use No   Interim medical history since last visit reviewed. Allergies and medications  reviewed  Review of Systems Per HPI unless specifically indicated above     Objective:    BP 138/74   Pulse 86   Temp 98.3 F (36.8 C) (Oral)   Resp 14   Wt 195 lb (88.5 kg)   SpO2 96%   BMI 29.65 kg/m   Wt Readings from Last 3 Encounters:  01/24/17 195 lb (88.5 kg)  10/07/16 195 lb 8 oz (88.7 kg)  08/23/16 193 lb 5 oz (87.7 kg)    Physical Exam  Constitutional: He appears well-developed and well-nourished. No distress.  Weight stable; overweight  HENT:  Head: Normocephalic and atraumatic.  Mouth/Throat: Oropharynx is clear and moist.  Very narrow posterior oropharynx  Eyes: EOM are normal. No scleral icterus.  Neck: No thyromegaly present.  Cardiovascular: Normal rate and regular rhythm.   Pulmonary/Chest: Effort normal and breath sounds normal. No respiratory distress. He has no wheezes.  Abdominal: Soft. Bowel sounds are  normal. He exhibits no distension.  Musculoskeletal: He exhibits no edema.  Neurological: Coordination normal.  Skin: Skin is warm and dry. No pallor.  Ruddy, tanned complexion; there is a 3 to 3.5 mm tick under the left axilla; single white dot on the dorsal thorax; not engorged; very mild erythema at bite site, no target lesion; removed in toto with pick-ups and a #15 blade; tolerated well by patient  Psychiatric: He has a normal mood and affect. His behavior is normal. Judgment and thought content normal. His mood appears not anxious. He does not exhibit a depressed mood.   Diabetic Foot Form - Detailed   Diabetic Foot Exam - detailed Diabetic Foot exam was performed with the following findings:  Yes 01/24/2017  9:57 AM  Visual Foot Exam completed.:  Yes  Are the toenails ingrown?:  No Normal Range of Motion:  Yes Pulse Foot Exam completed.:  Yes  Right Dorsalis Pedis:  Present Left Dorsalis Pedis:  Present  Sensory Foot Exam Completed.:  Yes Swelling:  No Semmes-Weinstein Monofilament Test R Site 1-Great Toe:  Pos L Site 1-Great Toe:  Pos  R Site 4:  Pos L Site 4:  Pos  R Site 5:  Pos L Site 5:  Pos        Results for orders placed or performed in visit on 10/28/16  Potassium  Result Value Ref Range   Potassium 4.4 3.5 - 5.3 mmol/L      Assessment & Plan:   Problem List Items Addressed This Visit      Respiratory   Emphysema of lung ([place])    Refer to pulmonologist      Relevant Orders   Ambulatory referral to Pulmonology     Digestive   Cirrhosis ([place]) (Chronic)    Covered by gastroenterologist; will check alpha-1 AT      Relevant Orders   COMPLETE METABOLIC PANEL WITH GFR   Ferritin   Alpha-1-Antitrypsin     Endocrine   Diabetes mellitus without complication (HCC) (Chronic)    Foot exam by MD; trying to eat well; patient will see his eye doctor; return for fasting labs      Relevant Orders   Hemoglobin A1c   Lipid panel     Other   Tobacco use  (Chronic)    Not ready to quit, but I am here and ready to help      Relevant Orders   CT CHEST LUNG CA SCREEN LOW DOSE W/O CM   Ambulatory referral to Pulmonology   Snoring  Refer to pulm      Relevant Orders   Ambulatory referral to Pulmonology   CBC with Differential/Platelet   Medication monitoring encounter    Check liver and kidneys      Relevant Orders   CBC with Differential/Platelet   COMPLETE METABOLIC PANEL WITH GFR   Alkaline phosphatase elevation    Managed by gastroenterologist      Relevant Orders   COMPLETE METABOLIC PANEL WITH GFR    Other Visit Diagnoses    Other fatigue    -  Primary   Relevant Orders   Ambulatory referral to Pulmonology   Testosterone   TSH   Witnessed episode of apnea       Relevant Orders   Ambulatory referral to Pulmonology   CBC with Differential/Platelet   Screening for prostate cancer       Relevant Orders   PSA       Follow up plan: No Follow-up on file.  An after-visit summary was printed and given to the patient at [facility].  Please see the patient instructions which may contain other information and recommendations beyond what is mentioned above in the assessment and plan.  No orders of the defined types were placed in this encounter.   Orders Placed This Encounter  Procedures  . CT CHEST LUNG CA SCREEN LOW DOSE W/O CM  . CBC with Differential/Platelet  . COMPLETE METABOLIC PANEL WITH GFR  . Hemoglobin A1c  . Lipid panel  . Testosterone  . TSH  . PSA  . Ferritin  . Alpha-1-Antitrypsin  . Ambulatory referral to Pulmonology    tesosterone, PSA CBC, A1c, cmp, tsh Alpha-1 AT deficiency

## 2017-01-25 ENCOUNTER — Telehealth: Payer: Self-pay | Admitting: *Deleted

## 2017-01-25 ENCOUNTER — Telehealth: Payer: Self-pay | Admitting: Family Medicine

## 2017-01-25 LAB — CBC WITH DIFFERENTIAL/PLATELET
BASOS PCT: 1 %
Basophils Absolute: 104 cells/uL (ref 0–200)
Eosinophils Absolute: 312 cells/uL (ref 15–500)
Eosinophils Relative: 3 %
HEMATOCRIT: 47 % (ref 38.5–50.0)
Hemoglobin: 15.8 g/dL (ref 13.2–17.1)
LYMPHS PCT: 17 %
Lymphs Abs: 1768 cells/uL (ref 850–3900)
MCH: 29.6 pg (ref 27.0–33.0)
MCHC: 33.6 g/dL (ref 32.0–36.0)
MCV: 88.2 fL (ref 80.0–100.0)
MONO ABS: 832 {cells}/uL (ref 200–950)
MPV: 9.7 fL (ref 7.5–12.5)
Monocytes Relative: 8 %
NEUTROS ABS: 7384 {cells}/uL (ref 1500–7800)
Neutrophils Relative %: 71 %
PLATELETS: 248 10*3/uL (ref 140–400)
RBC: 5.33 MIL/uL (ref 4.20–5.80)
RDW: 13.7 % (ref 11.0–15.0)
WBC: 10.4 10*3/uL (ref 3.8–10.8)

## 2017-01-25 LAB — CMP 10231
AG RATIO: 1.8 ratio (ref 1.0–2.5)
ALK PHOS: 141 U/L — AB (ref 40–115)
ALT: 26 U/L (ref 9–46)
AST: 20 U/L (ref 10–35)
Albumin: 4.3 g/dL (ref 3.6–5.1)
BILIRUBIN TOTAL: 1.1 mg/dL (ref 0.2–1.2)
BUN/Creatinine Ratio: 16.7 Ratio (ref 6–22)
BUN: 16 mg/dL (ref 7–25)
CALCIUM: 9.3 mg/dL (ref 8.6–10.3)
CO2: 23 mmol/L (ref 20–31)
CREATININE: 0.96 mg/dL (ref 0.70–1.33)
Chloride: 102 mmol/L (ref 98–110)
GFR, EST NON AFRICAN AMERICAN: 87 mL/min (ref >=60)
GLUCOSE: 161 mg/dL — AB (ref 65–99)
Globulin: 2.4 g/dL (ref 1.9–3.7)
Potassium: 5.4 mmol/L — ABNORMAL HIGH (ref 3.5–5.3)
Sodium: 139 mmol/L (ref 135–146)
Total Protein: 6.7 g/dL (ref 6.1–8.1)

## 2017-01-25 LAB — LIPID PANEL
CHOL/HDL RATIO: 4.8 ratio (ref <5.0)
CHOLESTEROL: 134 mg/dL (ref <200)
HDL: 28 mg/dL — ABNORMAL LOW (ref >40)
LDL Cholesterol: 75 mg/dL (ref <100)
Triglycerides: 156 mg/dL — ABNORMAL HIGH (ref <150)
VLDL: 31 mg/dL — ABNORMAL HIGH (ref <30)

## 2017-01-25 LAB — TSH: TSH: 1.92 m[IU]/L (ref 0.40–4.50)

## 2017-01-25 NOTE — Telephone Encounter (Signed)
Pt wife notified.

## 2017-01-25 NOTE — Telephone Encounter (Signed)
Tell them to STOP the invokana then I removed from med list Please add to adverse reactions Thank you

## 2017-01-25 NOTE — Telephone Encounter (Signed)
Received referral for low dose lung cancer screening CT scan. Voicemail left at phone number listed in EMR for patient to be aware that he will be contacted next month to schedule shared decision making visit/ CT scan. This is due to patient having CT of chest done 03/05/16.

## 2017-01-25 NOTE — Telephone Encounter (Signed)
Scott Rosario (wife) is requesting return call states its very important pertaining to medication [phone]

## 2017-01-25 NOTE — Telephone Encounter (Signed)
Spoke with pt wife. She states after starting [facility] he walks around like a zombie, tired all the time and not feeling like doing anything. 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 Pt wife states she was trying to figure out what was different and she states she really believe the medicine is affecting his mood. Please advise.

## 2017-01-26 LAB — HEMOGLOBIN A1C
Hgb A1c MFr Bld: 7.5 % — ABNORMAL HIGH (ref <5.7)
MEAN PLASMA GLUCOSE: 169 mg/dL

## 2017-01-26 LAB — FERRITIN: FERRITIN: 290 ng/mL (ref 20–380)

## 2017-01-26 LAB — PSA: PSA: 0.3 ng/mL (ref <=4.0)

## 2017-01-26 LAB — TESTOSTERONE: TESTOSTERONE: 560 ng/dL (ref 250–827)

## 2017-01-27 LAB — ALPHA-1-ANTITRYPSIN: A1 ANTITRYPSIN SER: 102 mg/dL (ref 83–199)

## 2017-02-03 ENCOUNTER — Other Ambulatory Visit: Payer: Self-pay | Admitting: Family Medicine

## 2017-02-03 NOTE — Telephone Encounter (Signed)
Request received for invokana but listed as allergy; DENIED

## 2017-02-08 ENCOUNTER — Telehealth: Payer: Self-pay | Admitting: Family Medicine

## 2017-02-08 NOTE — Telephone Encounter (Signed)
DR WANTED HIM TO SEE ABOUT DOING A SLEEP STUDY TEST ALONG WITH THE LUNG DR. PLEASE ADVISE FOR THEY HAVE NOT HEARD ANYTHING ABOUT EITHER ONE.

## 2017-02-09 NOTE — Telephone Encounter (Signed)
I tried to contact this patient to inform him that he has been scheduled to see Dr. Flora Lipps on 02/15/17 @ 2:30pm to address both of his issues, but there was no answer. A message was left for this patient with this information on it and to contact [place] at [phone] if he had any additional questions or needed to reschedule.

## 2017-02-15 ENCOUNTER — Encounter: Payer: Self-pay | Admitting: *Deleted

## 2017-02-15 ENCOUNTER — Encounter: Payer: Self-pay | Admitting: Internal Medicine

## 2017-02-15 ENCOUNTER — Ambulatory Visit (INDEPENDENT_AMBULATORY_CARE_PROVIDER_SITE_OTHER): Payer: Self-pay | Admitting: Internal Medicine

## 2017-02-15 VITALS — BP 128/76 | HR 99 | Resp 16 | Ht 68.0 in | Wt 197.0 lb

## 2017-02-15 DIAGNOSIS — M6530 Trigger finger, unspecified finger: Secondary | ICD-10-CM | POA: Insufficient documentation

## 2017-02-15 DIAGNOSIS — R918 Other nonspecific abnormal finding of lung field: Secondary | ICD-10-CM

## 2017-02-15 DIAGNOSIS — R0600 Dyspnea, unspecified: Secondary | ICD-10-CM

## 2017-02-15 MED ORDER — TIOTROPIUM BROMIDE MONOHYDRATE 2.5 MCG/ACT IN AERS: 2.0000 | INHALATION_SPRAY | Freq: Every day | RESPIRATORY_TRACT | 5 refills | Status: DC

## 2017-02-15 MED ORDER — NICOTINE 21 MG/24HR TD PT24: 21.0000 mg | MEDICATED_PATCH | Freq: Every day | TRANSDERMAL | 0 refills | Status: DC

## 2017-02-15 MED ORDER — FLUTICASONE-SALMETEROL 250-50 MCG/DOSE IN AEPB: 1.0000 | INHALATION_SPRAY | Freq: Two times a day (BID) | RESPIRATORY_TRACT | 5 refills | Status: DC

## 2017-02-15 NOTE — Progress Notes (Signed)
Name: Scott Rosario MRN: [id number] DOB: 08-25-1961    CONSULTATION DATE:  02/15/2017    CHIEF COMPLAINT: follow up COPD  HISTORY OF PRESENT ILLNESS:   56 yo white male with chronic SOB and wheezing Lost to follow up Previously seen dr Stevenson Clinch Still smokes 1.5 ppd for 30 years Has had 2 stents placed Needs PFT's Patient has been having excessive daytime sleepiness Patient has been having extreme fatigue and tiredness, lack of energy No signs of infection at this time  PAST MEDICAL HISTORY :   has a past medical history of Alkaline phosphatase elevation; Aortic atherosclerosis ([place]); Asthma; CAD (coronary artery disease); Cirrhosis ([place]); COPD (chronic obstructive pulmonary disease) ([place]); Diabetes mellitus without complication ([place]); Emphysema of lung ([place]); GERD (gastroesophageal reflux disease); Heart attack ([place]); Hyperlipidemia; Hypertension; Pulmonary nodules; Renal cyst; and Tobacco use.  has a past surgical history that includes Coronary angioplasty with stent (10/25/2003); Appendectomy; Cardiac catheterization (Left, 03/09/2016); and Cardiac catheterization (N/A, 03/09/2016). Prior to Admission medications   Medication Sig Start Date End Date Taking? Authorizing Provider  aspirin EC 81 MG tablet Take 81 mg by mouth daily.   Yes [provider]  atorvastatin (LIPITOR) 80 MG tablet TAKE 1 TABLET BY MOUTH AT BEDTIME 09/10/16  Yes [name], Satira Anis, MD  clopidogrel (PLAVIX) 75 MG tablet Take 75 mg by mouth daily.    Yes [provider]  glipiZIDE (GLUCOTROL XL) 10 MG 24 hr tablet TAKE 1 TABLET BY MOUTH ONCE DAILY 11/05/16  Yes [name], Satira Anis, MD  losartan (COZAAR) 50 MG tablet Take 1 tablet (50 mg total) by mouth daily. 10/29/16  Yes [name], Satira Anis, MD  metoprolol succinate (TOPROL-XL) 25 MG 24 hr tablet TAKE 1 TABLET BY MOUTH ONCE DAILY 11/05/16  Yes [name], Satira Anis, MD  nitroGLYCERIN (NITROSTAT) 0.4 MG SL tablet  03/06/16  Yes [provider]    pantoprazole (PROTONIX) 40 MG tablet TAKE 1 TABLET BY MOUTH ONCE DAILY 02/03/17  Yes [name], Satira Anis, MD   Allergies  Allergen Reactions  . Invokana [Canagliflozin] Other (See Comments)    adverse reactions: Fatigue and Zombie feeling     REVIEW OF SYSTEMS:   Constitutional: Negative for fever, chills, weight loss, +malaise/fatigue  HENT: Negative for hearing loss, ear pain, nosebleeds, congestion, sore throat, neck pain, tinnitus and ear discharge.   Eyes: Negative for blurred vision, double vision, photophobia, pain, discharge and redness.  Respiratory:  +cough, -hemoptysis, -sputum production, +shortness of breath, +wheezing .   Cardiovascular: Negative for chest pain, palpitations, orthopnea, claudication, leg swelling and PND.  Gastrointestinal: Negative for heartburn, nausea, vomiting, abdominal pain, diarrhea, constipation, blood in stool and melena.  Genitourinary: Negative for dysuria, urgency, frequency, hematuria and flank pain.  Musculoskeletal: Negative for myalgias, back pain, joint pain and falls.  Skin: Negative for itching and rash.  Neurological: Negative for dizziness, tingling, tremors, sensory change, speech change, focal weakness, seizures, loss of consciousness, weakness and headaches.  Endo/Heme/Allergies: Negative for environmental allergies and polydipsia. Does not bruise/bleed easily.  ALL OTHER ROS ARE NEGATIVE  BP 128/76 (BP Location: Right Arm, Cuff Size: Normal)   Pulse 99   Resp 16   Ht 5\' 8"  (1.727 m)   Wt 197 lb (89.4 kg)   SpO2 99%   BMI 29.95 kg/m    Physical Examination:   GENERAL:NAD, no fevers, chills, no weakness no fatigue HEAD: Normocephalic, atraumatic.  EYES: Pupils equal, round, reactive to light. Extraocular muscles intact. No scleral icterus.  MOUTH: Moist mucosal  membrane.   EAR, NOSE, THROAT: Clear without exudates. No external lesions.  NECK: Supple. No thyromegaly. No nodules. No JVD.  PULMONARY:CTA B/L +wheezes, no  crackles, no rhonchi CARDIOVASCULAR: S1 and S2. Regular rate and rhythm. No murmurs, rubs, or gallops. No edema.  GASTROINTESTINAL: Soft, nontender, nondistended. No masses. Positive bowel sounds.  MUSCULOSKELETAL: No swelling, clubbing, or edema. Range of motion full in all extremities.  NEUROLOGIC: Cranial nerves II through XII are intact. No gross focal neurological deficits.  SKIN: No ulceration, lesions, rashes, or cyanosis. Skin warm and dry. Turgor intact.  PSYCHIATRIC: Mood, affect within normal limits. The patient is awake, alert and oriented x 3. Insight, judgment intact.      ASSESSMENT / PLAN:  57 yo white male previously seen for SOB lost to follow up with ongoing tobacco abuse with excessive daytime sleepiness   1.Tobacco Cessation - Counseling regarding benefits of smoking cessation strategies was provided for more than 12 min. - Educated that at this time smoking- cessation represents the single most important step that patient can take to enhance the length and quality of live. - Educated patient regarding alternatives of behavior interventions, pharmacotherapy including NRT and non-nicotine therapy such, and combinations of both. Will start Nicotine patches -.       2. Patient with COPD given his clinical appearance. COPD is mostly a mixed type (emphysema, bronchitis) - pulmonary function testing and 6 minute walk test prior to follow up - avoid tobacco - start - albuterol RESCUE inhaler - 2puff every 3-4 hours ONLY as needed for shortness of breath\wheezing\recurrent cough     3.abnromal CT chest CT chest in March 2015 with biapical emphysematous changes in both paraseptal and centrilobular.  4.excessive daytime sleepiness Will need to assess for OSA    Follow up after tests completed   Patient satisfied with Plan of action and management. All questions answered  Corrin Parker, M.D.  [facility]  Medical Director  [place] Director [facility] Cardio-Pulmonary Department

## 2017-02-15 NOTE — Patient Instructions (Signed)
Start inhaler therapy Check PFT and 6MWT and ONO Split Night for OSA Needs CT chest

## 2017-02-17 ENCOUNTER — Ambulatory Visit: Payer: BLUE CROSS/BLUE SHIELD | Admitting: Family Medicine

## 2017-02-24 ENCOUNTER — Ambulatory Visit
Admission: RE | Admit: 2017-02-24 | Discharge: 2017-02-24 | Disposition: A | Discharge status: Home / Self Care | Payer: Self-pay | Source: Ambulatory Visit | Attending: Internal Medicine | Admitting: Internal Medicine

## 2017-02-24 ENCOUNTER — Ambulatory Visit (INDEPENDENT_AMBULATORY_CARE_PROVIDER_SITE_OTHER): Payer: Self-pay | Admitting: *Deleted

## 2017-02-24 DIAGNOSIS — R0600 Dyspnea, unspecified: Secondary | ICD-10-CM

## 2017-02-24 DIAGNOSIS — R911 Solitary pulmonary nodule: Secondary | ICD-10-CM | POA: Insufficient documentation

## 2017-02-24 DIAGNOSIS — I2510 Atherosclerotic heart disease of native coronary artery without angina pectoris: Secondary | ICD-10-CM | POA: Insufficient documentation

## 2017-02-24 DIAGNOSIS — R918 Other nonspecific abnormal finding of lung field: Secondary | ICD-10-CM

## 2017-02-24 DIAGNOSIS — J439 Emphysema, unspecified: Secondary | ICD-10-CM | POA: Insufficient documentation

## 2017-02-24 DIAGNOSIS — J449 Chronic obstructive pulmonary disease, unspecified: Secondary | ICD-10-CM

## 2017-02-24 DIAGNOSIS — I700 Atherosclerosis of aorta: Secondary | ICD-10-CM | POA: Insufficient documentation

## 2017-02-24 DIAGNOSIS — N200 Calculus of kidney: Secondary | ICD-10-CM | POA: Insufficient documentation

## 2017-02-24 NOTE — Progress Notes (Signed)
SIX MIN WALK 02/24/2017  Medications ASA 81 mg; Atorvastatin 80 mg; Clopidogrel 75 mg  Supplimental Oxygen during Test? (L/min) No  Laps 8  Partial Lap (in Meters) 2  Baseline BP (sitting) 126/62  Baseline Heartrate 83  Baseline Dyspnea (Borg Scale) 1  Baseline Fatigue (Borg Scale) 3  Baseline SPO2 97  BP (sitting) 160/80  Heartrate 89  Dyspnea (Borg Scale) 4  Fatigue (Borg Scale) 4  SPO2 97  BP (sitting) 130/70  Heartrate 91  SPO2 97  Stopped or Paused before Six Minutes No  Interpretation (No Data)  Distance Completed 386  Tech Comments: Patient walked at brisk walk and talked while walking for 6 mins. The only c/o after walk ended was burning in chest. The patient did not desat.    SMW performed today.

## 2017-03-02 ENCOUNTER — Encounter: Payer: Self-pay | Admitting: Internal Medicine

## 2017-03-02 DIAGNOSIS — R918 Other nonspecific abnormal finding of lung field: Secondary | ICD-10-CM

## 2017-03-03 ENCOUNTER — Other Ambulatory Visit: Payer: Self-pay | Admitting: Family Medicine

## 2017-03-03 DIAGNOSIS — E782 Mixed hyperlipidemia: Secondary | ICD-10-CM

## 2017-03-03 NOTE — Telephone Encounter (Signed)
One month approved; he is supposed to be following up with liver doctor Please make sure he is seeing his liver doctor about his cirrhosis

## 2017-03-04 NOTE — Telephone Encounter (Signed)
Left detailed messaged

## 2017-03-09 ENCOUNTER — Telehealth: Payer: Self-pay | Admitting: *Deleted

## 2017-03-09 ENCOUNTER — Telehealth: Payer: Self-pay | Admitting: Internal Medicine

## 2017-03-09 NOTE — Telephone Encounter (Addendum)
Pt aware  No 02 needed at this time normal ONO.

## 2017-03-09 NOTE — Telephone Encounter (Signed)
Pt aware of results 

## 2017-03-15 ENCOUNTER — Ambulatory Visit: Payer: Self-pay | Attending: Internal Medicine

## 2017-05-09 ENCOUNTER — Ambulatory Visit (INDEPENDENT_AMBULATORY_CARE_PROVIDER_SITE_OTHER): Payer: Self-pay | Admitting: Family Medicine

## 2017-05-09 ENCOUNTER — Encounter: Payer: Self-pay | Admitting: Family Medicine

## 2017-05-09 VITALS — BP 122/74 | HR 87 | Temp 98.5°F | Resp 16 | Ht 66.38 in | Wt 193.7 lb

## 2017-05-09 DIAGNOSIS — E119 Type 2 diabetes mellitus without complications: Secondary | ICD-10-CM

## 2017-05-09 DIAGNOSIS — J449 Chronic obstructive pulmonary disease, unspecified: Secondary | ICD-10-CM

## 2017-05-09 DIAGNOSIS — Z5181 Encounter for therapeutic drug level monitoring: Secondary | ICD-10-CM

## 2017-05-09 DIAGNOSIS — R0683 Snoring: Secondary | ICD-10-CM

## 2017-05-09 DIAGNOSIS — D239 Other benign neoplasm of skin, unspecified: Secondary | ICD-10-CM

## 2017-05-09 DIAGNOSIS — I2583 Coronary atherosclerosis due to lipid rich plaque: Secondary | ICD-10-CM

## 2017-05-09 DIAGNOSIS — I2510 Atherosclerotic heart disease of native coronary artery without angina pectoris: Secondary | ICD-10-CM

## 2017-05-09 DIAGNOSIS — Z720 Tobacco use: Secondary | ICD-10-CM

## 2017-05-09 DIAGNOSIS — E669 Obesity, unspecified: Secondary | ICD-10-CM

## 2017-05-09 DIAGNOSIS — R748 Abnormal levels of other serum enzymes: Secondary | ICD-10-CM

## 2017-05-09 DIAGNOSIS — E782 Mixed hyperlipidemia: Secondary | ICD-10-CM

## 2017-05-09 DIAGNOSIS — I700 Atherosclerosis of aorta: Secondary | ICD-10-CM

## 2017-05-09 DIAGNOSIS — W57XXXA Bitten or stung by nonvenomous insect and other nonvenomous arthropods, initial encounter: Secondary | ICD-10-CM

## 2017-05-09 DIAGNOSIS — R918 Other nonspecific abnormal finding of lung field: Secondary | ICD-10-CM

## 2017-05-09 DIAGNOSIS — L03116 Cellulitis of left lower limb: Secondary | ICD-10-CM

## 2017-05-09 LAB — COMPLETE METABOLIC PANEL WITH GFR
ALT: 28 U/L (ref 9–46)
AST: 15 U/L (ref 10–35)
Albumin: 4.5 g/dL (ref 3.6–5.1)
Alkaline Phosphatase: 164 U/L — ABNORMAL HIGH (ref 40–115)
BILIRUBIN TOTAL: 1.5 mg/dL — AB (ref 0.2–1.2)
BUN: 16 mg/dL (ref 7–25)
CHLORIDE: 101 mmol/L (ref 98–110)
CO2: 24 mmol/L (ref 20–32)
CREATININE: 0.87 mg/dL (ref 0.70–1.33)
Calcium: 9.3 mg/dL (ref 8.6–10.3)
GFR, Est Non African American: >89 mL/min (ref >=60)
Glucose, Bld: 260 mg/dL — ABNORMAL HIGH (ref 65–99)
Potassium: 4.4 mmol/L (ref 3.5–5.3)
Sodium: 135 mmol/L (ref 135–146)
Total Protein: 6.8 g/dL (ref 6.1–8.1)

## 2017-05-09 LAB — LIPID PANEL
Cholesterol: 117 mg/dL (ref <200)
HDL: 27 mg/dL — ABNORMAL LOW (ref >40)
LDL Cholesterol: 62 mg/dL (ref <100)
Total CHOL/HDL Ratio: 4.3 Ratio (ref <5.0)
Triglycerides: 138 mg/dL (ref <150)
VLDL: 28 mg/dL (ref <30)

## 2017-05-09 NOTE — Assessment & Plan Note (Signed)
Check liver and kidneys 

## 2017-05-09 NOTE — Assessment & Plan Note (Signed)
Managed by GI doctor 

## 2017-05-09 NOTE — Assessment & Plan Note (Signed)
Work on weight loss, down to 185 pounds over the next couple of months

## 2017-05-09 NOTE — Assessment & Plan Note (Addendum)
Discussed the findings from the CT scan in May 2018; stressed to patient the importance of follow-up with Dr. Nehemiah Massed

## 2017-05-09 NOTE — Assessment & Plan Note (Signed)
He says he has to have the mindset; not ready to quit yet; I am here to help if and when he is ready

## 2017-05-09 NOTE — Assessment & Plan Note (Signed)
Reviewed last chest CT and all stable per report

## 2017-05-09 NOTE — Patient Instructions (Addendum)
Please do follow-up with Dr. Nehemiah Massed for an appointment soon Continue the aspirin Call 911 if you develop chest Try to work on weight loss I do encourage you to quit smoking Call [phone] to sign up for smoking cessation classes You can call 1-800-QUIT-NOW to talk with a smoking cessation coach Quitting or cutting back on cigarettes, walking more, and losing weight will all help you raise your HDL Try to limit saturated fats in your diet (bologna, hot dogs, barbeque, cheeseburgers, hamburgers, steak, bacon, sausage, cheese, etc.) and get more fresh fruits, vegetables, and whole grains -- this will help lower your LDL   Steps to Quit Smoking Smoking tobacco can be bad for your health. It can also affect almost every organ in your body. Smoking puts you and people around you at risk for many serious long-lasting (chronic) diseases. Quitting smoking is hard, but it is one of the best things that you can do for your health. It is never too late to quit. What are the benefits of quitting smoking? When you quit smoking, you lower your risk for getting serious diseases and conditions. They can include:  Lung cancer or lung disease.  Heart disease.  Stroke.  Heart attack.  Not being able to have children (infertility).  Weak bones (osteoporosis) and broken bones (fractures).  If you have coughing, wheezing, and shortness of breath, those symptoms may get better when you quit. You may also get sick less often. If you are pregnant, quitting smoking can help to lower your chances of having a baby of low birth weight. What can I do to help me quit smoking? Talk with your doctor about what can help you quit smoking. Some things you can do (strategies) include:  Quitting smoking totally, instead of slowly cutting back how much you smoke over a period of time.  Going to in-person counseling. You are more likely to quit if you go to many counseling sessions.  Using resources and support  systems, such as: ? Database administrator with a Social worker. ? Phone quitlines. ? Careers information officer. ? Support groups or group counseling. ? Text messaging programs. ? Mobile phone apps or applications.  Taking medicines. Some of these medicines may have nicotine in them. If you are pregnant or breastfeeding, do not take any medicines to quit smoking unless your doctor says it is okay. Talk with your doctor about counseling or other things that can help you.  Talk with your doctor about using more than one strategy at the same time, such as taking medicines while you are also going to in-person counseling. This can help make quitting easier. What things can I do to make it easier to quit? Quitting smoking might feel very hard at first, but there is a lot that you can do to make it easier. Take these steps:  Talk to your family and friends. Ask them to support and encourage you.  Call phone quitlines, reach out to support groups, or work with a Social worker.  Ask people who smoke to not smoke around you.  Avoid places that make you want (trigger) to smoke, such as: ? Bars. ? Parties. ? Smoke-break areas at work.  Spend time with people who do not smoke.  Lower the stress in your life. Stress can make you want to smoke. Try these things to help your stress: ? Getting regular exercise. ? Deep-breathing exercises. ? Yoga. ? Meditating. ? Doing a body scan. To do this, close your eyes, focus on one area of  your body at a time from head to toe, and notice which parts of your body are tense. Try to relax the muscles in those areas.  Download or buy apps on your mobile phone or tablet that can help you stick to your quit plan. There are many free apps, such as QuitGuide from the [employer] Office manager for Disease Control and Prevention). You can find more support from smokefree.gov and other websites.  This information is not intended to replace advice given to you by your health care provider. Make  sure you discuss any questions you have with your health care provider. Document Released: 07/10/2009 Document Revised: 05/11/2016 Document Reviewed: 01/28/2015 Elsevier Interactive Patient Education  2018 [employer].

## 2017-05-09 NOTE — Assessment & Plan Note (Signed)
Order home sleep study  

## 2017-05-09 NOTE — Assessment & Plan Note (Addendum)
Check lipids today; quitting smoking and more activity would help raise HDL; low HDL contributes to heart attacks; continue the aspirin

## 2017-05-09 NOTE — Assessment & Plan Note (Signed)
Active and not limited; he is not ready to quit smoking, unfortunately

## 2017-05-09 NOTE — Assessment & Plan Note (Signed)
Foot exam by MD; check A1c today; try to limit sweets

## 2017-05-09 NOTE — Progress Notes (Signed)
BP 122/74   Pulse 87   Temp 98.5 F (36.9 C) (Oral)   Resp 16   Ht 5' 6.38" (1.686 m)   Wt 193 lb 11.2 oz (87.9 kg)   SpO2 96%   BMI 30.91 kg/m    Subjective:    Patient ID: Scott Rosario, male    DOB: 1961/03/13, 56 y.o.   MRN: [id number]  HPI: Scott Rosario is a 56 y.o. male  Chief Complaint  Patient presents with  . Insect Bite    tick bite removed 5 from inner thighs groin area.  Was walking on property.  Symptoms include reddness and itching.    HPI  He has seen two ticks in the skin and he got them off, checked to make he got the head and everything; itched big time all weekend; he got up at 12:30 this morning, and was just washing the area with soap and water; really itchy; two pots on the upper right thigh, one spot upper left thigh; redness surprised him; still really red No fevers No neck stiffness No headaches No abdominal pain, no nausea  He has a little knot in his left axilla where the tick was cut off a while back; flares up once in a while but it's fine now  Type 2 diabetes; not taking invokana any more; last A1c was 7.5  Cirrhosis; elevated alk phos; "he said the liver was fine"  Low HDL; smoking over 1 ppd; he does not "walk walk" but is very active, building a barn  The breathing test was "fine"; he has not done the sleep study; he wants a home sleep study; he changed his sleeping habits; sleeping better and more rested in the morning  Tobacco abuse; "don't ask"; he is not ready to quit; wife is on him to quit  He had the heart attack in July of last year; they did the cardiac catheterization, and had to put in a new stent; old stents looked fine  Depression screen [facility] 2/9 05/09/2017 01/24/2017 08/23/2016  Decreased Interest 0 0 0  Down, Depressed, Hopeless 0 1 0  PHQ - 2 Score 0 1 0    Relevant past medical, surgical, family and social history reviewed Past Medical History:  Diagnosis Date  . Alkaline phosphatase elevation   .  Aortic atherosclerosis ([place])    on CT 02/25/14  . Asthma   . CAD (coronary artery disease)   . Cirrhosis ([place])   . COPD (chronic obstructive pulmonary disease) ([place])   . Diabetes mellitus without complication ([place])   . Emphysema of lung ([place])   . GERD (gastroesophageal reflux disease)   . Heart attack ([place])   . Hyperlipidemia   . Hypertension   . Pulmonary nodules    x 3, (8m, 529m 31m29mchest CT February 25, 2014  . Renal cyst    on CT 02/25/14  . Tobacco use    Past Surgical History:  Procedure Laterality Date  . APPENDECTOMY    . CARDIAC CATHETERIZATION Left 03/09/2016   Procedure: Left Heart Cath and Coronary Angiography;  Surgeon: BruCorey SkainsD;  [facility];  Service: Cardiovascular;  Laterality: Left;  . CARDIAC CATHETERIZATION N/A 03/09/2016   Procedure: Coronary Stent Intervention;  Surgeon: AleIsaias CowmanD;  [facility];  Service: Cardiovascular;  Laterality: N/A;  . CORONARY ANGIOPLASTY WITH STENT PLACEMENT  10/25/2003   x 2   Family History  Problem Relation Age of Onset  .  Diabetes Mother   . Hypertension Mother   . Arthritis Mother   . Hyperlipidemia Mother   . Heart attack Father   . Epilepsy Father   . Diabetes Father   . Seizures Father   . Heart disease Father   . Hyperlipidemia Father   . Hypertension Father   . Stroke Father   . Diabetes Brother   . Hyperlipidemia Brother   . Hypertension Brother   . Cancer Maternal Grandmother        unknown  . Diabetes Paternal Grandmother   . Heart disease Paternal Grandmother   . Hyperlipidemia Paternal Grandmother   . Hypertension Paternal Grandmother   . Heart attack Paternal Grandfather    Social History   Social History  . Marital status: Married    Spouse name: N/A  . Number of children: N/A  . Years of education: N/A   Occupational History  . Not on file.   Social History Main Topics  . Smoking status: Current Every Day Smoker    Packs/day: 1.00     Years: 40.00    Types: Cigarettes  . Smokeless tobacco: Never Used  . Alcohol use No  . Drug use: No  . Sexual activity: Yes   Other Topics Concern  . Not on file   Social History Narrative  . No narrative on file    Interim medical history since last visit reviewed. Allergies and medications reviewed  Review of Systems Per HPI unless specifically indicated above     Objective:    BP 122/74   Pulse 87   Temp 98.5 F (36.9 C) (Oral)   Resp 16   Ht 5' 6.38" (1.686 m)   Wt 193 lb 11.2 oz (87.9 kg)   SpO2 96%   BMI 30.91 kg/m   Wt Readings from Last 3 Encounters:  05/09/17 193 lb 11.2 oz (87.9 kg)  02/15/17 197 lb (89.4 kg)  01/24/17 195 lb (88.5 kg)    Physical Exam  Constitutional: He appears well-developed and well-nourished. No distress.  Eyes: No scleral icterus.  Cardiovascular: Normal rate and regular rhythm.   Pulmonary/Chest: Effort normal and breath sounds normal.  Abdominal: Soft. He exhibits no distension.  Neurological: He is alert.  Skin: No pallor.  Several erythematous lesions on the legs; consistent with recent insect/arthropod bites; no target lesions; no fluctuance, no drainage; some surrounding erythema, but no erythematous streaking proximally; one lesion is fibrous and firm without telangiectasia  Psychiatric: He has a normal mood and affect. His mood appears not anxious. He does not exhibit a depressed mood.   Diabetic Foot Form - Detailed   Diabetic Foot Exam - detailed Diabetic Foot exam was performed with the following findings:  Yes 05/09/2017  4:46 PM  Visual Foot Exam completed.:  Yes  Pulse Foot Exam completed.:  Yes  Right Dorsalis Pedis:  Present Left Dorsalis Pedis:  Present  Sensory Foot Exam Completed.:  Yes Semmes-Weinstein Monofilament Test R Site 1-Great Toe:  Pos L Site 1-Great Toe:  Pos          Assessment & Plan:   Problem List Items Addressed This Visit      Cardiovascular and Mediastinum   CAD (coronary artery  disease) (Chronic)    Discussed the findings from the CT scan in May 2018; stressed to patient the importance of follow-up with Dr. Nehemiah Massed      Relevant Orders   Lipid panel (Completed)   Aortic atherosclerosis ([place]) (Chronic)    Noted on  CT scan      Relevant Orders   Lipid panel (Completed)     Respiratory   COPD (chronic obstructive pulmonary disease) (H[facility]) (Chronic)    Active and not limited; he is not ready to quit smoking, unfortunately        Endocrine   Diabetes mellitus without complication ([facility]) (Chronic)    Foot exam by MD; check A1c today; try to limit sweets      Relevant Orders   Hemoglobin A1c (Completed)   Lipid panel (Completed)   Microalbumin / creatinine urine ratio (Completed)     Other   Tobacco use (Chronic)    He says he has to have the mindset; not ready to quit yet; I am here to help if and when he is ready      Snoring    Order home sleep study      Obesity (BMI 30.0-34.9)    Work on weight loss, down to 185 pounds over the next couple of months      Multiple pulmonary nodules determined by computed tomography of lung (Chronic)    Reviewed last chest CT and all stable per report      Medication monitoring encounter    Check liver and kidneys      Relevant Orders   COMPLETE METABOLIC PANEL WITH GFR (Completed)   Hyperlipidemia (Chronic)    Check lipids today; quitting smoking and more activity would help raise HDL; low HDL contributes to heart attacks; continue the aspirin      Alkaline phosphatase elevation    Managed by GI doctor       Other Visit Diagnoses    Cellulitis of left leg    -  Primary   antibiotics; see AVS   Dermatofibroma       explained benign   Tick bite, initial encounter       will have him watch for s/s of tick-borne illness and seek help right away if occurs; repellent advised       Follow up plan: Return in about 3 months (around 08/09/2017) for twenty minute follow-up with fasting labs.  An  after-visit summary was printed and given to the patient at [place].  Please see the patient instructions which may contain other information and recommendations beyond what is mentioned above in the assessment and plan.  No orders of the defined types were placed in this encounter.   Orders Placed This Encounter  Procedures  . Hemoglobin A1c  . Lipid panel  . Microalbumin / creatinine urine ratio  . COMPLETE METABOLIC PANEL WITH GFR

## 2017-05-09 NOTE — Assessment & Plan Note (Signed)
Noted on CT scan.

## 2017-05-10 LAB — MICROALBUMIN / CREATININE URINE RATIO
Creatinine, Urine: 73 mg/dL (ref 20–370)
MICROALB UR: 1.1 mg/dL
Microalb Creat Ratio: 15 mcg/mg creat (ref <30)

## 2017-05-10 LAB — HEMOGLOBIN A1C
HEMOGLOBIN A1C: 9.5 % — AB (ref <5.7)
MEAN PLASMA GLUCOSE: 226 mg/dL

## 2017-05-16 ENCOUNTER — Telehealth: Payer: Self-pay | Admitting: Family Medicine

## 2017-05-16 NOTE — Telephone Encounter (Signed)
I spoke with patient; explained A1c is out of control Alk phos and total bili have gone up; he says he has not seen Dr. Wohl in about a year Asked him to please make an appt to see his liver doctor Then I'll contact GI specialist about options for diabetes treatment Patient adamantly refuses injectable 

## 2017-05-16 NOTE — Telephone Encounter (Signed)
A1c is now out of control I called to discuss labs Left message, please call me back to go over labs

## 2017-05-17 ENCOUNTER — Telehealth: Payer: Self-pay | Admitting: Internal Medicine

## 2017-05-17 ENCOUNTER — Telehealth: Payer: Self-pay | Admitting: Gastroenterology

## 2017-05-17 NOTE — Telephone Encounter (Signed)
Patient states that pcp wants to know if the liver enzymes from previos visit are ok to treat his diabetes or need new labs?

## 2017-05-17 NOTE — Telephone Encounter (Signed)
The diabetes should be treated and the liver enzymes should not stop this from being treated.

## 2017-05-17 NOTE — Telephone Encounter (Signed)
Scheduled fu with Kasa 9/12 at 430 patient no show for previously schedule PFT

## 2017-05-17 NOTE — Telephone Encounter (Signed)
Called patient's spouse and let her know pt needs to reschedule PFT preferably before 9/12 follow up with Dr. Mortimer Fries.

## 2017-05-17 NOTE — Telephone Encounter (Signed)
Please advise 

## 2017-05-18 ENCOUNTER — Other Ambulatory Visit: Payer: Self-pay | Admitting: Family Medicine

## 2017-05-18 DIAGNOSIS — E782 Mixed hyperlipidemia: Secondary | ICD-10-CM

## 2017-05-20 ENCOUNTER — Telehealth: Payer: Self-pay | Admitting: Family Medicine

## 2017-05-20 NOTE — Telephone Encounter (Signed)
I just wrote Dr. Allen Norris, and I'm considering metformin 500 mg BID and Januvia 100 mg daily We'll contact patient when I hear back If I get a note over the weekend, I'll send prescriptions to his pharmacy

## 2017-05-20 NOTE — Telephone Encounter (Signed)
Left detailed vociemail

## 2017-05-20 NOTE — Telephone Encounter (Signed)
Pt would like a call back regarding his GI results.

## 2017-05-20 NOTE — Telephone Encounter (Signed)
Dr. Allen Norris, I'm writing to ask your recommendations for this patient's uncontrolled diabetes in light of his cirrhosis 1. Is metformin safe for him to take, and if so, what is the max dose you would recommend? 2. I'm going to also add Januvia if that's okay with you (primarily renally cleared) 3.  He is on 80 mg of atorvastatin with normal SGPT (hx of heart attack); I'd like to keep his LDL under 70; any problems with continuing that? Thank you so much, Dr. Enid Derry

## 2017-05-20 NOTE — Telephone Encounter (Signed)
Called patient states you were going to get in touch with Dr. Durwin Reges to decide what DM meds he needed to be on?

## 2017-05-23 NOTE — Telephone Encounter (Signed)
Pt advised PCP can use previous labs to treat his diabetes.

## 2017-05-24 ENCOUNTER — Ambulatory Visit: Payer: Self-pay | Attending: Internal Medicine

## 2017-05-24 ENCOUNTER — Ambulatory Visit: Payer: Self-pay

## 2017-05-24 DIAGNOSIS — R1310 Dysphagia, unspecified: Secondary | ICD-10-CM | POA: Insufficient documentation

## 2017-05-24 DIAGNOSIS — R0600 Dyspnea, unspecified: Secondary | ICD-10-CM

## 2017-05-24 MED ORDER — ALBUTEROL SULFATE (2.5 MG/3ML) 0.083% IN NEBU
2.5000 mg | INHALATION_SOLUTION | Freq: Once | RESPIRATORY_TRACT | Status: AC
  Administered 2017-05-24: 2.5 mg via RESPIRATORY_TRACT
  Filled 2017-05-24: qty 3

## 2017-05-24 NOTE — Telephone Encounter (Signed)
Dr. Sanda Klein, Although this patient has had chronically elevated alkaline phosphatase and his bilirubin is mainly indirect in the past, the ultrasound showed him to have nodularity with possible cirrhosis.  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  I recommend continued following of his LFTs to see if any of these medications make his alkaline phosphatase ago.  The patient should also follow up with me sometime soon.  Lucilla Lame, MD

## 2017-05-25 MED ORDER — SITAGLIPTIN PHOSPHATE 100 MG PO TABS: 100.0000 mg | ORAL_TABLET | Freq: Every day | ORAL | 2 refills | Status: DC

## 2017-05-25 MED ORDER — METFORMIN HCL 500 MG PO TABS: 500.0000 mg | ORAL_TABLET | Freq: Two times a day (BID) | ORAL | 2 refills | Status: DC

## 2017-05-25 NOTE — Telephone Encounter (Signed)
Patient notified

## 2017-05-25 NOTE — Telephone Encounter (Signed)
Thank you so much, Dr. Allen Norris -------------------------------------------- Scott Rosario, please let patient know that I'm adding metformin twice a day (start with just one pill a day, about 15 minutes before breakfast, for one week, then go to one pill twice a day, 15 minutes before breakfast and again 15 minutes before evening meal) We'll also add Januvia once a day Thank you

## 2017-06-01 ENCOUNTER — Telehealth: Payer: Self-pay | Admitting: Family Medicine

## 2017-06-01 NOTE — Telephone Encounter (Signed)
Spoke with the pt wife and she states that he said the dizziness and and bad headaches didn't start until he started taking the metformin. Mariann Laster ( wife) schedule an appt with Dr. Ancil Boozer  on Monday September 10th @ 9:40am. Mariann Laster asked can he stop the metformin; consult with Dr. Ancil Boozer and she states if his numbers run high with just glipizide then no continue it if not then stop it intil Monday.

## 2017-06-01 NOTE — Telephone Encounter (Signed)
Mariann Laster (wife) was metformin makes him dizzy an have bad headaches. He started the medication last week. He will stop taking it. Celesta Gentile is $600 without insurance (currently do not have insurance) would like something less expensive called in [phone] Marden Noble) [phone] Mariann Laster)

## 2017-06-01 NOTE — Telephone Encounter (Signed)
He is on the cheapest medications, Glipizide and Metformin, unfortunately most of the diabetes are very expensive. He will need to come in to change prescriptions or wait until Dr. Sanda Klein returns

## 2017-06-06 ENCOUNTER — Ambulatory Visit: Payer: Self-pay | Admitting: Family Medicine

## 2017-06-08 ENCOUNTER — Ambulatory Visit: Payer: Self-pay | Admitting: Internal Medicine

## 2017-06-22 ENCOUNTER — Encounter: Payer: Self-pay | Admitting: *Deleted

## 2017-06-22 ENCOUNTER — Encounter: Payer: Self-pay | Admitting: Gastroenterology

## 2017-06-22 ENCOUNTER — Ambulatory Visit: Payer: Self-pay | Admitting: Gastroenterology

## 2017-07-01 ENCOUNTER — Ambulatory Visit: Payer: Self-pay | Admitting: Internal Medicine

## 2017-08-09 ENCOUNTER — Encounter: Payer: Self-pay | Admitting: Family Medicine

## 2017-08-09 ENCOUNTER — Ambulatory Visit (INDEPENDENT_AMBULATORY_CARE_PROVIDER_SITE_OTHER): Payer: Self-pay | Admitting: Family Medicine

## 2017-08-09 DIAGNOSIS — J439 Emphysema, unspecified: Secondary | ICD-10-CM

## 2017-08-09 DIAGNOSIS — E782 Mixed hyperlipidemia: Secondary | ICD-10-CM

## 2017-08-09 DIAGNOSIS — I2510 Atherosclerotic heart disease of native coronary artery without angina pectoris: Secondary | ICD-10-CM

## 2017-08-09 DIAGNOSIS — E119 Type 2 diabetes mellitus without complications: Secondary | ICD-10-CM

## 2017-08-09 DIAGNOSIS — R748 Abnormal levels of other serum enzymes: Secondary | ICD-10-CM

## 2017-08-09 DIAGNOSIS — I10 Essential (primary) hypertension: Secondary | ICD-10-CM

## 2017-08-09 DIAGNOSIS — Z720 Tobacco use: Secondary | ICD-10-CM

## 2017-08-09 DIAGNOSIS — Z5181 Encounter for therapeutic drug level monitoring: Secondary | ICD-10-CM

## 2017-08-09 DIAGNOSIS — I2583 Coronary atherosclerosis due to lipid rich plaque: Secondary | ICD-10-CM

## 2017-08-09 MED ORDER — VARENICLINE TARTRATE 0.5 MG X 11 & 1 MG X 42 PO MISC: ORAL | 0 refills | Status: DC

## 2017-08-09 NOTE — Assessment & Plan Note (Signed)
Moderate to severe; showed Fletcher-Peto curve, urged smoking cessation

## 2017-08-09 NOTE — Progress Notes (Signed)
BP 136/72 (BP Location: Left Arm, Patient Position: Sitting, Cuff Size: Large)   Pulse 75   Temp 98.5 F (36.9 C) (Oral)   Ht '5\' 8"'  (1.727 m)   Wt 198 lb 12.8 oz (90.2 kg)   SpO2 98%   BMI 30.23 kg/m    Subjective:    Patient ID: Scott Rosario, male    DOB: [birth year], 56 y.o.   MRN: [id number]  HPI: Scott Rosario is a 56 y.o. male  Chief Complaint  Patient presents with  . Follow-up    Pt is fasting for labs, denies complaints   . Immunizations    Please review PNA vaccine need    HPI Patient is here for follow-up of several chronic health issues  Breathing test done August 2018: Spirometry Data Is Acceptable and Reproducible  Moderate to severe Obstructive Airways Disease with Significant BD response With evidence of air trapping  Recommend smoking cessation Consider outpatient Pulmonary Consultation Clinical Correlation Advised  Around other smokers He is smoking 1 ppd He would be interested in quitting with Chantix  He has type 2 diabetes; fasting for labs; no problems with his feet  High cholesterol; on statin  Hx of heart attack; sees cardiologist  Hx of elev alk phos, seeing GI  Depression screen [facility] 2/9 08/09/2017 05/09/2017 01/24/2017 08/23/2016  Decreased Interest 0 0 0 0  Down, Depressed, Hopeless 0 0 1 0  PHQ - 2 Score 0 0 1 0    Relevant past medical, surgical, family and social history reviewed Past Medical History:  Diagnosis Date  . Alkaline phosphatase elevation   . Aortic atherosclerosis ([place])    on CT 02/25/14  . Asthma   . CAD (coronary artery disease)   . Cirrhosis ([place])   . COPD (chronic obstructive pulmonary disease) ([place])   . Diabetes mellitus without complication ([place])   . Emphysema of lung ([place])   . GERD (gastroesophageal reflux disease)   . Heart attack ([place])   . Hyperlipidemia   . Hypertension   . Pulmonary nodules    x 3, (736m, 572m 36m59mchest CT February 25, 2014  . Renal cyst    on CT 02/25/14  . Tobacco  use    Past Surgical History:  Procedure Laterality Date  . APPENDECTOMY    . CORONARY ANGIOPLASTY WITH STENT PLACEMENT  10/25/2003   x 2  . Coronary Stent Intervention N/A 03/09/2016   [facility]  . Left Heart Cath and Coronary Angiography Left 03/09/2016   [facility]   Family History  Problem Relation Age of Onset  . Diabetes Mother   . Hypertension Mother   . Arthritis Mother   . Hyperlipidemia Mother   . Heart attack Father   . Epilepsy Father   . Diabetes Father   . Seizures Father   . Heart disease Father   . Hyperlipidemia Father   . Hypertension Father   . Stroke Father   . Diabetes Brother   . Hyperlipidemia Brother   . Hypertension Brother   . Cancer Maternal Grandmother        unknown  . Diabetes Paternal Grandmother   . Heart disease Paternal Grandmother   . Hyperlipidemia Paternal Grandmother   . Hypertension Paternal Grandmother   . Heart attack Paternal Grandfather    Social History   Socioeconomic History  . Marital status: Married    Spouse  name: Not on file  . Number of children: Not on file  . Years of education: Not on file  . Highest education level: Not on file  Social Needs  . Financial resource strain: Not on file  . Food insecurity - worry: Not on file  . Food insecurity - inability: Not on file  . Transportation needs - medical: Not on file  . Transportation needs - non-medical: Not on file  Occupational History  . Not on file  Tobacco Use  . Smoking status: Current Every Day Smoker    Packs/day: 1.00    Years: 40.00    Pack years: 40.00    Types: Cigarettes  . Smokeless tobacco: Never Used  Substance and Sexual Activity  . Alcohol use: No    Alcohol/week: 0.0 oz  . Drug use: No  . Sexual activity: Yes  Other Topics Concern  . Not on file  Social History Narrative  . Not on file    Interim medical history since last visit  reviewed. Allergies and medications reviewed  Review of Systems  Constitutional: Negative for unexpected weight change.  Cardiovascular: Negative for chest pain.   Per HPI unless specifically indicated above     Objective:    BP 136/72 (BP Location: Left Arm, Patient Position: Sitting, Cuff Size: Large)   Pulse 75   Temp 98.5 F (36.9 C) (Oral)   Ht '5\' 8"'  (1.727 m)   Wt 198 lb 12.8 oz (90.2 kg)   SpO2 98%   BMI 30.23 kg/m   Wt Readings from Last 3 Encounters:  08/09/17 198 lb 12.8 oz (90.2 kg)  05/09/17 193 lb 11.2 oz (87.9 kg)  02/15/17 197 lb (89.4 kg)    Physical Exam  Constitutional: He appears well-developed and well-nourished. No distress.  HENT:  Head: Normocephalic and atraumatic.  Eyes: EOM are normal. No scleral icterus.  Neck: No thyromegaly present.  Cardiovascular: Normal rate and regular rhythm.  Pulmonary/Chest: Effort normal and breath sounds normal.  Abdominal: Soft. Bowel sounds are normal. He exhibits no distension.  Musculoskeletal: He exhibits no edema.  Neurological: Coordination normal.  Skin: Skin is warm and dry. No pallor.  Psychiatric: He has a normal mood and affect. His behavior is normal.   Diabetic Foot Form - Detailed   Diabetic Foot Exam - detailed Diabetic Foot exam was performed with the following findings:  Yes 08/09/2017 10:48 AM  Visual Foot Exam completed.:  Yes  Pulse Foot Exam completed.:  Yes  Right Dorsalis Pedis:  Present Left Dorsalis Pedis:  Present  Sensory Foot Exam Completed.:  Yes Semmes-Weinstein Monofilament Test R Site 1-Great Toe:  Pos L Site 1-Great Toe:  Pos          Assessment & Plan:   Problem List Items Addressed This Visit      Cardiovascular and Mediastinum   Hypertension (Chronic)    Fair control      CAD (coronary artery disease) (Chronic)    No chest pain; on plavix and aspirin        Respiratory   COPD (chronic obstructive pulmonary disease) (HCC) (Chronic)    Moderate to severe;  showed Fletcher-Peto curve, urged smoking cessation      Relevant Medications   varenicline (CHANTIX STARTING MONTH PAK) 0.5 MG X 11 & 1 MG X 42 tablet     Endocrine   Diabetes mellitus without complication (HCC) (Chronic)    Not checking FSBS with my blessing; foot exam by MD today; check every night;  check A1c      Relevant Orders   Microalbumin / creatinine urine ratio (Completed)   Lipid panel (Completed)   Hemoglobin A1c (Completed)     Other   Tobacco use (Chronic)    More than 3 minutes spent counseling on cessation      Medication monitoring encounter    Check liver and kidney function      Hyperlipidemia (Chronic)    Check lipids; try to limit saturated fats      Relevant Orders   Lipid panel (Completed)   Alkaline phosphatase elevation    Monitored by GI doctor      Relevant Orders   COMPLETE METABOLIC PANEL WITH GFR (Completed)       Follow up plan: Return in about 3 months (around 11/09/2017) for twenty minute follow-up with fasting labs.  An after-visit summary was printed and given to the patient at [place].  Please see the patient instructions which may contain other information and recommendations beyond what is mentioned above in the assessment and plan.  Meds ordered this encounter  Medications  . varenicline (CHANTIX STARTING MONTH PAK) 0.5 MG X 11 & 1 MG X 42 tablet    Sig: Take one 0.5 mg tablet by mouth once daily for 3 days, then increase to one 0.5 mg tablet twice daily for 4 days, then increase to one 1 mg tablet twice daily.    Dispense:  53 tablet    Refill:  0    [facility]    Orders Placed This Encounter  Procedures  . Microalbumin / creatinine urine ratio  . Lipid panel  . Hemoglobin A1c  . COMPLETE METABOLIC PANEL WITH GFR

## 2017-08-09 NOTE — Assessment & Plan Note (Signed)
Fair control.

## 2017-08-09 NOTE — Assessment & Plan Note (Signed)
Check lipids; try to limit saturated fats 

## 2017-08-09 NOTE — Assessment & Plan Note (Signed)
No chest pain; on plavix and aspirin

## 2017-08-09 NOTE — Assessment & Plan Note (Signed)
Not checking FSBS with my blessing; foot exam by MD today; check every night; check A1c

## 2017-08-09 NOTE — Patient Instructions (Addendum)
Start Chantix and pick day 8 as your quit day Break the habits, change things up Your body only needs 6 mcg of B12 a day; 250 or 500 or 1000 mcg daily is plenty If the dose on your bottle is more than that, it's certainly okay to take less often (1-2 x a week)    Steps to Quit Smoking Smoking tobacco can be bad for your health. It can also affect almost every organ in your body. Smoking puts you and people around you at risk for many serious long-lasting (chronic) diseases. Quitting smoking is hard, but it is one of the best things that you can do for your health. It is never too late to quit. What are the benefits of quitting smoking? When you quit smoking, you lower your risk for getting serious diseases and conditions. They can include:  Lung cancer or lung disease.  Heart disease.  Stroke.  Heart attack.  Not being able to have children (infertility).  Weak bones (osteoporosis) and broken bones (fractures).  If you have coughing, wheezing, and shortness of breath, those symptoms may get better when you quit. You may also get sick less often. If you are pregnant, quitting smoking can help to lower your chances of having a baby of low birth weight. What can I do to help me quit smoking? Talk with your doctor about what can help you quit smoking. Some things you can do (strategies) include:  Quitting smoking totally, instead of slowly cutting back how much you smoke over a period of time.  Going to in-person counseling. You are more likely to quit if you go to many counseling sessions.  Using resources and support systems, such as: ? Database administrator with a Social worker. ? Phone quitlines. ? Careers information officer. ? Support groups or group counseling. ? Text messaging programs. ? Mobile phone apps or applications.  Taking medicines. Some of these medicines may have nicotine in them. If you are pregnant or breastfeeding, do not take any medicines to quit smoking unless your  doctor says it is okay. Talk with your doctor about counseling or other things that can help you.  Talk with your doctor about using more than one strategy at the same time, such as taking medicines while you are also going to in-person counseling. This can help make quitting easier. What things can I do to make it easier to quit? Quitting smoking might feel very hard at first, but there is a lot that you can do to make it easier. Take these steps:  Talk to your family and friends. Ask them to support and encourage you.  Call phone quitlines, reach out to support groups, or work with a Social worker.  Ask people who smoke to not smoke around you.  Avoid places that make you want (trigger) to smoke, such as: ? Bars. ? Parties. ? Smoke-break areas at work.  Spend time with people who do not smoke.  Lower the stress in your life. Stress can make you want to smoke. Try these things to help your stress: ? Getting regular exercise. ? Deep-breathing exercises. ? Yoga. ? Meditating. ? Doing a body scan. To do this, close your eyes, focus on one area of your body at a time from head to toe, and notice which parts of your body are tense. Try to relax the muscles in those areas.  Download or buy apps on your mobile phone or tablet that can help you stick to your quit plan. There are  many free apps, such as QuitGuide from the [employer] Office manager for Disease Control and Prevention). You can find more support from smokefree.gov and other websites.  This information is not intended to replace advice given to you by your health care provider. Make sure you discuss any questions you have with your health care provider. Document Released: 07/10/2009 Document Revised: 05/11/2016 Document Reviewed: 01/28/2015 Elsevier Interactive Patient Education  2018 [employer].

## 2017-08-09 NOTE — Assessment & Plan Note (Signed)
Check liver and kidney function 

## 2017-08-09 NOTE — Assessment & Plan Note (Signed)
Monitored by GI doctor

## 2017-08-09 NOTE — Assessment & Plan Note (Signed)
More than 3 minutes spent counseling on cessation

## 2017-08-10 LAB — COMPLETE METABOLIC PANEL WITH GFR
AG RATIO: 1.8 (calc) (ref 1.0–2.5)
ALBUMIN MSPROF: 4.2 g/dL (ref 3.6–5.1)
ALT: 22 U/L (ref 9–46)
AST: 14 U/L (ref 10–35)
Alkaline phosphatase (APISO): 132 U/L — ABNORMAL HIGH (ref 40–115)
BUN: 9 mg/dL (ref 7–25)
CALCIUM: 9 mg/dL (ref 8.6–10.3)
CHLORIDE: 102 mmol/L (ref 98–110)
CO2: 30 mmol/L (ref 20–32)
Creat: 0.72 mg/dL (ref 0.70–1.33)
GFR, EST AFRICAN AMERICAN: 119 mL/min/{1.73_m2} (ref >=60)
GFR, Est Non African American: 103 mL/min/{1.73_m2} (ref >=60)
Globulin: 2.3 g/dL (calc) (ref 1.9–3.7)
Glucose, Bld: 226 mg/dL — ABNORMAL HIGH (ref 65–99)
POTASSIUM: 4.3 mmol/L (ref 3.5–5.3)
SODIUM: 136 mmol/L (ref 135–146)
TOTAL PROTEIN: 6.5 g/dL (ref 6.1–8.1)
Total Bilirubin: 1 mg/dL (ref 0.2–1.2)

## 2017-08-10 LAB — HEMOGLOBIN A1C
Hgb A1c MFr Bld: 9 % of total Hgb — ABNORMAL HIGH (ref <5.7)
Mean Plasma Glucose: 212 (calc)
eAG (mmol/L): 11.7 (calc)

## 2017-08-10 LAB — LIPID PANEL
Cholesterol: 111 mg/dL (ref <200)
HDL: 29 mg/dL — ABNORMAL LOW (ref >40)
LDL CHOLESTEROL (CALC): 62 mg/dL
Non-HDL Cholesterol (Calc): 82 mg/dL (calc) (ref <130)
Total CHOL/HDL Ratio: 3.8 (calc) (ref <5.0)
Triglycerides: 123 mg/dL (ref <150)

## 2017-08-10 LAB — MICROALBUMIN / CREATININE URINE RATIO
CREATININE, URINE: 62 mg/dL (ref 20–320)
Microalb Creat Ratio: 15 mcg/mg creat (ref <30)
Microalb, Ur: 0.9 mg/dL

## 2017-08-16 ENCOUNTER — Other Ambulatory Visit: Payer: Self-pay

## 2017-08-16 DIAGNOSIS — E119 Type 2 diabetes mellitus without complications: Secondary | ICD-10-CM

## 2017-08-19 ENCOUNTER — Other Ambulatory Visit: Payer: Self-pay | Admitting: Family Medicine

## 2017-08-19 DIAGNOSIS — E782 Mixed hyperlipidemia: Secondary | ICD-10-CM

## 2017-09-10 ENCOUNTER — Inpatient Hospital Stay
Admission: EM | Admit: 2017-09-10 | Discharge: 2017-09-13 | DRG: 871 | Disposition: A | Discharge status: Home / Self Care | Payer: Self-pay | Attending: Internal Medicine | Admitting: Internal Medicine

## 2017-09-10 ENCOUNTER — Other Ambulatory Visit: Payer: Self-pay

## 2017-09-10 ENCOUNTER — Encounter: Payer: Self-pay | Admitting: Emergency Medicine

## 2017-09-10 ENCOUNTER — Emergency Department: Payer: Self-pay

## 2017-09-10 DIAGNOSIS — K7460 Unspecified cirrhosis of liver: Secondary | ICD-10-CM | POA: Diagnosis present

## 2017-09-10 DIAGNOSIS — J440 Chronic obstructive pulmonary disease with acute lower respiratory infection: Secondary | ICD-10-CM | POA: Diagnosis present

## 2017-09-10 DIAGNOSIS — I10 Essential (primary) hypertension: Secondary | ICD-10-CM | POA: Diagnosis present

## 2017-09-10 DIAGNOSIS — Z8249 Family history of ischemic heart disease and other diseases of the circulatory system: Secondary | ICD-10-CM

## 2017-09-10 DIAGNOSIS — J181 Lobar pneumonia, unspecified organism: Secondary | ICD-10-CM

## 2017-09-10 DIAGNOSIS — J9601 Acute respiratory failure with hypoxia: Secondary | ICD-10-CM | POA: Diagnosis present

## 2017-09-10 DIAGNOSIS — K219 Gastro-esophageal reflux disease without esophagitis: Secondary | ICD-10-CM | POA: Diagnosis present

## 2017-09-10 DIAGNOSIS — Z7982 Long term (current) use of aspirin: Secondary | ICD-10-CM

## 2017-09-10 DIAGNOSIS — J441 Chronic obstructive pulmonary disease with (acute) exacerbation: Secondary | ICD-10-CM | POA: Diagnosis present

## 2017-09-10 DIAGNOSIS — N281 Cyst of kidney, acquired: Secondary | ICD-10-CM | POA: Diagnosis present

## 2017-09-10 DIAGNOSIS — Z7984 Long term (current) use of oral hypoglycemic drugs: Secondary | ICD-10-CM

## 2017-09-10 DIAGNOSIS — Z888 Allergy status to other drugs, medicaments and biological substances status: Secondary | ICD-10-CM

## 2017-09-10 DIAGNOSIS — I2510 Atherosclerotic heart disease of native coronary artery without angina pectoris: Secondary | ICD-10-CM | POA: Diagnosis present

## 2017-09-10 DIAGNOSIS — A419 Sepsis, unspecified organism: Principal | ICD-10-CM | POA: Diagnosis present

## 2017-09-10 DIAGNOSIS — J189 Pneumonia, unspecified organism: Secondary | ICD-10-CM | POA: Diagnosis present

## 2017-09-10 DIAGNOSIS — Z7902 Long term (current) use of antithrombotics/antiplatelets: Secondary | ICD-10-CM

## 2017-09-10 DIAGNOSIS — I700 Atherosclerosis of aorta: Secondary | ICD-10-CM | POA: Diagnosis present

## 2017-09-10 DIAGNOSIS — Z23 Encounter for immunization: Secondary | ICD-10-CM

## 2017-09-10 DIAGNOSIS — F17210 Nicotine dependence, cigarettes, uncomplicated: Secondary | ICD-10-CM | POA: Diagnosis present

## 2017-09-10 DIAGNOSIS — E785 Hyperlipidemia, unspecified: Secondary | ICD-10-CM | POA: Diagnosis present

## 2017-09-10 DIAGNOSIS — Z79899 Other long term (current) drug therapy: Secondary | ICD-10-CM

## 2017-09-10 DIAGNOSIS — Z833 Family history of diabetes mellitus: Secondary | ICD-10-CM

## 2017-09-10 DIAGNOSIS — E119 Type 2 diabetes mellitus without complications: Secondary | ICD-10-CM | POA: Diagnosis present

## 2017-09-10 DIAGNOSIS — I252 Old myocardial infarction: Secondary | ICD-10-CM

## 2017-09-10 LAB — CBC
HCT: 44.8 % (ref 40.0–52.0)
Hemoglobin: 15.1 g/dL (ref 13.0–18.0)
MCH: 29.3 pg (ref 26.0–34.0)
MCHC: 33.7 g/dL (ref 32.0–36.0)
MCV: 87.1 fL (ref 80.0–100.0)
PLATELETS: 185 10*3/uL (ref 150–440)
RBC: 5.14 MIL/uL (ref 4.40–5.90)
RDW: 13.1 % (ref 11.5–14.5)
WBC: 17.6 10*3/uL — AB (ref 3.8–10.6)

## 2017-09-10 LAB — COMPREHENSIVE METABOLIC PANEL
ALK PHOS: 167 U/L — AB (ref 38–126)
ALT: 30 U/L (ref 17–63)
AST: 30 U/L (ref 15–41)
Albumin: 3.8 g/dL (ref 3.5–5.0)
Anion gap: 10 (ref 5–15)
BILIRUBIN TOTAL: 3.1 mg/dL — AB (ref 0.3–1.2)
BUN: 17 mg/dL (ref 6–20)
CALCIUM: 8.9 mg/dL (ref 8.9–10.3)
CO2: 24 mmol/L (ref 22–32)
CREATININE: 0.94 mg/dL (ref 0.61–1.24)
Chloride: 102 mmol/L (ref 101–111)
GFR calc Af Amer: >60 mL/min (ref >60)
Glucose, Bld: 216 mg/dL — ABNORMAL HIGH (ref 65–99)
POTASSIUM: 4.1 mmol/L (ref 3.5–5.1)
Sodium: 136 mmol/L (ref 135–145)
TOTAL PROTEIN: 7.1 g/dL (ref 6.5–8.1)

## 2017-09-10 LAB — GLUCOSE, CAPILLARY
GLUCOSE-CAPILLARY: 201 mg/dL — AB (ref 65–99)
Glucose-Capillary: 243 mg/dL — ABNORMAL HIGH (ref 65–99)

## 2017-09-10 LAB — URINALYSIS, COMPLETE (UACMP) WITH MICROSCOPIC
Bacteria, UA: NONE SEEN
Bilirubin Urine: NEGATIVE
GLUCOSE, UA: 50 mg/dL — AB
HGB URINE DIPSTICK: NEGATIVE
Ketones, ur: NEGATIVE mg/dL
LEUKOCYTES UA: NEGATIVE
NITRITE: NEGATIVE
Protein, ur: 30 mg/dL — AB
SPECIFIC GRAVITY, URINE: 1.024 (ref 1.005–1.030)
Squamous Epithelial / LPF: NONE SEEN
pH: 5 (ref 5.0–8.0)

## 2017-09-10 LAB — INFLUENZA PANEL BY PCR (TYPE A & B)
INFLBPCR: NEGATIVE
Influenza A By PCR: NEGATIVE

## 2017-09-10 LAB — LACTIC ACID, PLASMA
LACTIC ACID, VENOUS: 3.9 mmol/L — AB (ref 0.5–1.9)
Lactic Acid, Venous: 2 mmol/L (ref 0.5–1.9)
Lactic Acid, Venous: 2.9 mmol/L (ref 0.5–1.9)

## 2017-09-10 LAB — BLOOD GAS, VENOUS
ACID-BASE DEFICIT: 0.3 mmol/L (ref 0.0–2.0)
BICARBONATE: 24.1 mmol/L (ref 20.0–28.0)
O2 Saturation: 75.5 %
PATIENT TEMPERATURE: 37
PO2 VEN: 40 mmHg (ref 32.0–45.0)
pCO2, Ven: 38 mmHg — ABNORMAL LOW (ref 44.0–60.0)
pH, Ven: 7.41 (ref 7.250–7.430)

## 2017-09-10 LAB — TROPONIN I
Troponin I: <0.03 ng/mL (ref <0.03)
Troponin I: <0.03 ng/mL (ref <0.03)

## 2017-09-10 LAB — LIPASE, BLOOD: Lipase: 22 U/L (ref 11–51)

## 2017-09-10 MED ORDER — CEFTRIAXONE SODIUM IN DEXTROSE 20 MG/ML IV SOLN
1.0000 g | Freq: Once | INTRAVENOUS | Status: AC
  Administered 2017-09-10: 1 g via INTRAVENOUS
  Filled 2017-09-10: qty 50

## 2017-09-10 MED ORDER — SODIUM CHLORIDE 0.9 % IV SOLN
INTRAVENOUS | Status: AC
  Administered 2017-09-10 – 2017-09-11 (×2): via INTRAVENOUS

## 2017-09-10 MED ORDER — ACETAMINOPHEN 325 MG PO TABS
650.0000 mg | ORAL_TABLET | Freq: Once | ORAL | Status: AC
  Administered 2017-09-10: 650 mg via ORAL
  Filled 2017-09-10: qty 2

## 2017-09-10 MED ORDER — DEXTROSE 5 % IV SOLN
500.0000 mg | Freq: Once | INTRAVENOUS | Status: AC
  Administered 2017-09-10: 500 mg via INTRAVENOUS
  Filled 2017-09-10: qty 500

## 2017-09-10 MED ORDER — NITROGLYCERIN 0.4 MG SL SUBL: 0.4000 mg | SUBLINGUAL_TABLET | SUBLINGUAL | Status: DC | PRN

## 2017-09-10 MED ORDER — METFORMIN HCL 500 MG PO TABS: 500.0000 mg | ORAL_TABLET | Freq: Two times a day (BID) | ORAL | Status: DC

## 2017-09-10 MED ORDER — IBUPROFEN 400 MG PO TABS
400.0000 mg | ORAL_TABLET | Freq: Four times a day (QID) | ORAL | Status: DC | PRN
  Administered 2017-09-10 – 2017-09-13 (×3): 400 mg via ORAL
  Filled 2017-09-10 (×3): qty 1

## 2017-09-10 MED ORDER — IPRATROPIUM-ALBUTEROL 0.5-2.5 (3) MG/3ML IN SOLN: 3.0000 mL | RESPIRATORY_TRACT | Status: DC | PRN

## 2017-09-10 MED ORDER — GLIPIZIDE ER 10 MG PO TB24
10.0000 mg | ORAL_TABLET | Freq: Every day | ORAL | Status: DC
  Administered 2017-09-11 – 2017-09-13 (×3): 10 mg via ORAL
  Filled 2017-09-10 (×3): qty 1

## 2017-09-10 MED ORDER — DOCUSATE SODIUM 100 MG PO CAPS
100.0000 mg | ORAL_CAPSULE | Freq: Two times a day (BID) | ORAL | Status: DC
  Administered 2017-09-10 – 2017-09-13 (×6): 100 mg via ORAL
  Filled 2017-09-10 (×6): qty 1

## 2017-09-10 MED ORDER — ASPIRIN EC 81 MG PO TBEC
81.0000 mg | DELAYED_RELEASE_TABLET | Freq: Every day | ORAL | Status: DC
  Administered 2017-09-10 – 2017-09-13 (×4): 81 mg via ORAL
  Filled 2017-09-10 (×4): qty 1

## 2017-09-10 MED ORDER — INSULIN ASPART 100 UNIT/ML ~~LOC~~ SOLN
0.0000 [IU] | Freq: Every day | SUBCUTANEOUS | Status: DC
  Administered 2017-09-10 – 2017-09-12 (×2): 2 [IU] via SUBCUTANEOUS
  Filled 2017-09-10 (×2): qty 1

## 2017-09-10 MED ORDER — SODIUM CHLORIDE 0.9 % IV BOLUS (SEPSIS)
500.0000 mL | Freq: Once | INTRAVENOUS | Status: AC
  Administered 2017-09-10: 500 mL via INTRAVENOUS

## 2017-09-10 MED ORDER — ACETAMINOPHEN 650 MG RE SUPP
650.0000 mg | Freq: Four times a day (QID) | RECTAL | Status: DC | PRN
Start: 2017-09-10 — End: 2017-09-13

## 2017-09-10 MED ORDER — INFLUENZA VAC SPLIT QUAD 0.5 ML IM SUSY
0.5000 mL | PREFILLED_SYRINGE | INTRAMUSCULAR | Status: AC
  Administered 2017-09-13: 0.5 mL via INTRAMUSCULAR
  Filled 2017-09-10: qty 0.5

## 2017-09-10 MED ORDER — ONDANSETRON HCL 4 MG/2ML IJ SOLN: 4.0000 mg | Freq: Once | INTRAMUSCULAR | Status: DC | PRN

## 2017-09-10 MED ORDER — GLIPIZIDE ER 10 MG PO TB24: 10.0000 mg | ORAL_TABLET | Freq: Every day | ORAL | Status: DC

## 2017-09-10 MED ORDER — SODIUM CHLORIDE 0.9 % IV BOLUS (SEPSIS)
1000.0000 mL | Freq: Once | INTRAVENOUS | Status: AC
  Administered 2017-09-10: 1000 mL via INTRAVENOUS

## 2017-09-10 MED ORDER — CLOPIDOGREL BISULFATE 75 MG PO TABS
75.0000 mg | ORAL_TABLET | Freq: Every day | ORAL | Status: DC
  Administered 2017-09-10 – 2017-09-13 (×4): 75 mg via ORAL
  Filled 2017-09-10 (×4): qty 1

## 2017-09-10 MED ORDER — IPRATROPIUM-ALBUTEROL 0.5-2.5 (3) MG/3ML IN SOLN
3.0000 mL | Freq: Once | RESPIRATORY_TRACT | Status: AC
  Administered 2017-09-10: 3 mL via RESPIRATORY_TRACT
  Filled 2017-09-10: qty 3

## 2017-09-10 MED ORDER — ACETAMINOPHEN 325 MG PO TABS
650.0000 mg | ORAL_TABLET | Freq: Four times a day (QID) | ORAL | Status: DC | PRN
  Administered 2017-09-10 – 2017-09-13 (×4): 650 mg via ORAL
  Filled 2017-09-10 (×4): qty 2

## 2017-09-10 MED ORDER — ATORVASTATIN CALCIUM 20 MG PO TABS
80.0000 mg | ORAL_TABLET | Freq: Every day | ORAL | Status: DC
  Administered 2017-09-10 – 2017-09-12 (×3): 80 mg via ORAL
  Filled 2017-09-10 (×3): qty 4

## 2017-09-10 MED ORDER — ONDANSETRON HCL 4 MG/2ML IJ SOLN
4.0000 mg | Freq: Once | INTRAMUSCULAR | Status: AC
  Administered 2017-09-10: 4 mg via INTRAVENOUS
  Filled 2017-09-10: qty 2

## 2017-09-10 MED ORDER — ONDANSETRON HCL 4 MG/2ML IJ SOLN: 4.0000 mg | Freq: Four times a day (QID) | INTRAMUSCULAR | Status: DC | PRN

## 2017-09-10 MED ORDER — ONDANSETRON HCL 4 MG PO TABS: 4.0000 mg | ORAL_TABLET | Freq: Four times a day (QID) | ORAL | Status: DC | PRN

## 2017-09-10 MED ORDER — POLYETHYLENE GLYCOL 3350 17 G PO PACK: 17.0000 g | PACK | Freq: Every day | ORAL | Status: DC | PRN

## 2017-09-10 MED ORDER — INSULIN ASPART 100 UNIT/ML ~~LOC~~ SOLN
0.0000 [IU] | Freq: Three times a day (TID) | SUBCUTANEOUS | Status: DC
  Administered 2017-09-10: 3 [IU] via SUBCUTANEOUS
  Administered 2017-09-11: 1 [IU] via SUBCUTANEOUS
  Administered 2017-09-11: 2 [IU] via SUBCUTANEOUS
  Administered 2017-09-12 – 2017-09-13 (×2): 3 [IU] via SUBCUTANEOUS
  Administered 2017-09-13: 1 [IU] via SUBCUTANEOUS
  Filled 2017-09-10 (×6): qty 1

## 2017-09-10 MED ORDER — TRAMADOL HCL 50 MG PO TABS
50.0000 mg | ORAL_TABLET | Freq: Four times a day (QID) | ORAL | Status: DC | PRN
  Administered 2017-09-10 – 2017-09-11 (×2): 50 mg via ORAL
  Filled 2017-09-10 (×2): qty 1

## 2017-09-10 MED ORDER — PANTOPRAZOLE SODIUM 40 MG PO TBEC
40.0000 mg | DELAYED_RELEASE_TABLET | Freq: Every day | ORAL | Status: DC
  Administered 2017-09-10 – 2017-09-13 (×4): 40 mg via ORAL
  Filled 2017-09-10 (×4): qty 1

## 2017-09-10 MED ORDER — PNEUMOCOCCAL VAC POLYVALENT 25 MCG/0.5ML IJ INJ
0.5000 mL | INJECTION | INTRAMUSCULAR | Status: AC
  Administered 2017-09-13: 0.5 mL via INTRAMUSCULAR
  Filled 2017-09-10: qty 0.5

## 2017-09-10 MED ORDER — DEXTROSE 5 % IV SOLN
500.0000 mg | INTRAVENOUS | Status: DC
  Administered 2017-09-11 – 2017-09-12 (×2): 500 mg via INTRAVENOUS
  Filled 2017-09-10 (×3): qty 500

## 2017-09-10 MED ORDER — CYCLOBENZAPRINE HCL 10 MG PO TABS
5.0000 mg | ORAL_TABLET | Freq: Three times a day (TID) | ORAL | Status: AC
  Administered 2017-09-10 – 2017-09-12 (×6): 5 mg via ORAL
  Filled 2017-09-10 (×6): qty 1

## 2017-09-10 MED ORDER — METOPROLOL SUCCINATE ER 25 MG PO TB24
25.0000 mg | ORAL_TABLET | Freq: Every day | ORAL | Status: DC
  Administered 2017-09-10 – 2017-09-13 (×3): 25 mg via ORAL
  Filled 2017-09-10 (×3): qty 1

## 2017-09-10 MED ORDER — LOSARTAN POTASSIUM 50 MG PO TABS
50.0000 mg | ORAL_TABLET | Freq: Every day | ORAL | Status: DC
  Administered 2017-09-10 – 2017-09-13 (×3): 50 mg via ORAL
  Filled 2017-09-10 (×3): qty 1

## 2017-09-10 MED ORDER — DEXTROSE 5 % IV SOLN
1.0000 g | INTRAVENOUS | Status: DC
  Administered 2017-09-11 – 2017-09-13 (×3): 1 g via INTRAVENOUS
  Filled 2017-09-10 (×3): qty 10

## 2017-09-10 MED ORDER — ENOXAPARIN SODIUM 40 MG/0.4ML ~~LOC~~ SOLN
40.0000 mg | SUBCUTANEOUS | Status: DC
  Administered 2017-09-10 – 2017-09-12 (×3): 40 mg via SUBCUTANEOUS
  Filled 2017-09-10 (×3): qty 0.4

## 2017-09-10 MED ORDER — NICOTINE 21 MG/24HR TD PT24
21.0000 mg | MEDICATED_PATCH | Freq: Every day | TRANSDERMAL | Status: DC
  Administered 2017-09-10 – 2017-09-13 (×4): 21 mg via TRANSDERMAL
  Filled 2017-09-10 (×4): qty 1

## 2017-09-10 NOTE — ED Notes (Signed)
Pt transported to room 212 

## 2017-09-10 NOTE — ED Provider Notes (Signed)
[facility] Emergency Department Provider Note ____________________________________________   First MD Initiated Contact with Patient 09/10/17 1110     (approximate)  I have reviewed the triage vital signs and the nursing notes.   HISTORY  Chief Complaint Shortness of Breath; Cough; and Generalized Body Aches    HPI Scott Rosario is a 56 y.o. male with past medical history as noted below who presents with shortness of breath over the last 3 days, gradual onset, worsening course, associated with cough productive of whitish sputum, and some episodes of posttussive emesis.  Patient's wife also reports that a few days ago the sputum was tinged with a small amount of blood.  Patient also reports subjective fever, generalized weakness and lightheadedness, and some right-sided chest pain associated with the cough.  He reports bilateral lower extremity pain but no swelling.  Past Medical History:  Diagnosis Date  . Alkaline phosphatase elevation   . Aortic atherosclerosis ([place])    on CT 02/25/14  . Asthma   . CAD (coronary artery disease)   . Cirrhosis ([place])   . COPD (chronic obstructive pulmonary disease) ([place])   . Diabetes mellitus without complication ([place])   . Emphysema of lung ([place])   . GERD (gastroesophageal reflux disease)   . Heart attack ([place])   . Hyperlipidemia   . Hypertension   . Pulmonary nodules    x 3, (40mm, 82mm, 42mm) chest CT February 25, 2014  . Renal cyst    on CT 02/25/14  . Tobacco use     Patient Active Problem List   Diagnosis Date Noted  . Obesity (BMI 30.0-34.9) 05/09/2017  . Acquired trigger finger 02/15/2017  . Snoring 05/07/2016  . Medication monitoring encounter 08/19/2015  . COPD (chronic obstructive pulmonary disease) ([place])   . Emphysema of lung ([place])   . Diabetes mellitus without complication ([place])   . GERD (gastroesophageal reflux disease)   . Hyperlipidemia   . Hypertension   . Tobacco use   . Pulmonary nodules   .  CAD (coronary artery disease)   . Cirrhosis ([place])   . Alkaline phosphatase elevation   . Aortic atherosclerosis ([place])   . Multiple pulmonary nodules determined by computed tomography of lung 12/31/2014  . COPD mixed type ([place]) 12/31/2014    Past Surgical History:  Procedure Laterality Date  . APPENDECTOMY    . CARDIAC CATHETERIZATION Left 03/09/2016   Procedure: Left Heart Cath and Coronary Angiography;  Surgeon: Corey Skains, MD;  Location: [facility];  Service: Cardiovascular;  Laterality: Left;  . CARDIAC CATHETERIZATION N/A 03/09/2016   Procedure: Coronary Stent Intervention;  Surgeon: Isaias Cowman, MD;  Location: [facility] [facility];  Service: Cardiovascular;  Laterality: N/A;  . CORONARY ANGIOPLASTY WITH STENT PLACEMENT  10/25/2003   x 2    Prior to Admission medications   Medication Sig Start Date End Date Taking? Authorizing Provider  aspirin EC 81 MG tablet Take 81 mg by mouth daily.    [provider]  atorvastatin (LIPITOR) 80 MG tablet TAKE 1 TABLET BY MOUTH AT BEDTIME 08/21/17   [name], Satira Anis, MD  clopidogrel (PLAVIX) 75 MG tablet Take 75 mg by mouth daily.     [provider]  glipiZIDE (GLUCOTROL XL) 10 MG 24 hr tablet TAKE 1 TABLET BY MOUTH ONCE DAILY 11/05/16   Arnetha Courser, MD  losartan (COZAAR) 50 MG tablet TAKE 1 TABLET BY MOUTH ONCE DAILY 08/21/17   Arnetha Courser, MD  metFORMIN (GLUCOPHAGE) 500 MG tablet Take 1 tablet (500 mg total) by mouth 2 (two) times daily with a meal. 05/25/17   [name], Satira Anis, MD  metoprolol succinate (TOPROL-XL) 25 MG 24 hr tablet TAKE 1 TABLET BY MOUTH ONCE DAILY 11/05/16   [name], Satira Anis, MD  nitroGLYCERIN (NITROSTAT) 0.4 MG SL tablet Place 0.4 mg under the tongue every 5 (five) minutes as needed.  03/06/16   [provider]  pantoprazole (PROTONIX) 40 MG tablet TAKE 1 TABLET BY MOUTH ONCE DAILY 08/21/17   [name], Satira Anis, MD  sitaGLIPtin (JANUVIA) 100 MG tablet Take 1 tablet (100 mg total)  by mouth daily. Patient not taking: Reported on 08/09/2017 05/25/17   Arnetha Courser, MD  varenicline (CHANTIX STARTING MONTH PAK) 0.5 MG X 11 & 1 MG X 42 tablet Take one 0.5 mg tablet by mouth once daily for 3 days, then increase to one 0.5 mg tablet twice daily for 4 days, then increase to one 1 mg tablet twice daily. 08/09/17   Arnetha Courser, MD    Allergies Invokana [canagliflozin]  Family History  Problem Relation Age of Onset  . Diabetes Mother   . Hypertension Mother   . Arthritis Mother   . Hyperlipidemia Mother   . Heart attack Father   . Epilepsy Father   . Diabetes Father   . Seizures Father   . Heart disease Father   . Hyperlipidemia Father   . Hypertension Father   . Stroke Father   . Diabetes Brother   . Hyperlipidemia Brother   . Hypertension Brother   . Cancer Maternal Grandmother        unknown  . Diabetes Paternal Grandmother   . Heart disease Paternal Grandmother   . Hyperlipidemia Paternal Grandmother   . Hypertension Paternal Grandmother   . Heart attack Paternal Grandfather     Social History Social History   Tobacco Use  . Smoking status: Current Every Day Smoker    Packs/day: 1.00    Years: 40.00    Pack years: 40.00    Types: Cigarettes  . Smokeless tobacco: Never Used  Substance Use Topics  . Alcohol use: No    Alcohol/week: 0.0 oz  . Drug use: No    Review of Systems  Constitutional: Positive for fever. Eyes: No redness. ENT: No sore throat. Cardiovascular: Positive for chest pain. Respiratory: Positive for shortness of breath. Gastrointestinal: Positive for vomiting.  No diarrhea.  Genitourinary: Negative for dysuria.  Musculoskeletal: Negative for back pain. Skin: Negative for rash. Neurological: Negative for headache.    ____________________________________________   PHYSICAL EXAM:  VITAL SIGNS: ED Triage Vitals  Enc Vitals Group     BP 09/10/17 1050 (!) 111/57     Pulse Rate 09/10/17 1050 (!) 124     Resp  09/10/17 1050 (!) 22     Temp 09/10/17 1050 100.2 F (37.9 C)     Temp Source 09/10/17 1050 Oral     SpO2 09/10/17 1050 93 %     Weight 09/10/17 1051 195 lb (88.5 kg)     Height 09/10/17 1051 5\' 8"  (1.727 m)     Head Circumference --      Peak Flow --      Pain Score 09/10/17 1049 7     Pain Loc --      Pain Edu? --      Excl. in [place]? --     Constitutional: Alert and oriented.  Somewhat uncomfortable appearing but in  no acute distress. Eyes: Conjunctivae are normal.  Head: Atraumatic. Nose: No congestion/rhinnorhea. Mouth/Throat: Mucous membranes are slightly dry.   Neck: Normal range of motion.  Cardiovascular: Normal rate, regular rhythm. Grossly normal heart sounds.  Good peripheral circulation. Respiratory: Normal respiratory effort.  No retractions.  Trace scattered wheezes bilaterally. Gastrointestinal: Soft and nontender. No distention.  Genitourinary: No CVA tenderness. Musculoskeletal: No lower extremity edema.  Extremities warm and well perfused.  Neurologic:  Normal speech and language. No gross focal neurologic deficits are appreciated.  Skin:  Skin is warm and dry. No rash noted. Psychiatric: Mood and affect are normal. Speech and behavior are normal.  ____________________________________________   LABS (all labs ordered are listed, but only abnormal results are displayed)  Labs Reviewed  COMPREHENSIVE METABOLIC PANEL - Abnormal; Notable for the following components:      Result Value   Glucose, Bld 216 (*)    Alkaline Phosphatase 167 (*)    Total Bilirubin 3.1 (*)    All other components within normal limits  CBC - Abnormal; Notable for the following components:   WBC 17.6 (*)    All other components within normal limits  BLOOD GAS, VENOUS - Abnormal; Notable for the following components:   pCO2, Ven 38 (*)    All other components within normal limits  LACTIC ACID, PLASMA - Abnormal; Notable for the following components:   Lactic Acid, Venous 2.0 (*)     All other components within normal limits  CULTURE, BLOOD (ROUTINE X 2)  CULTURE, BLOOD (ROUTINE X 2)  LIPASE, BLOOD  TROPONIN I  INFLUENZA PANEL BY PCR (TYPE A & B)  URINALYSIS, COMPLETE (UACMP) WITH MICROSCOPIC  LACTIC ACID, PLASMA   ____________________________________________  EKG  ED ECG REPORT I, Arta Silence, the attending physician, personally viewed and interpreted this ECG.  Date: 09/10/2017 EKG Time: 1101 Rate: 115 Rhythm: Sinus tachycardia QRS Axis: normal Intervals: normal ST/T Wave abnormalities: Nonspecific ST abnormality V1 to V2 Narrative Interpretation: no evidence of acute ischemia; no significant change when compared to EKG of 03/10/2016  ____________________________________________  RADIOLOGY  CXR: R upper lobe pneumonia  ____________________________________________   PROCEDURES  Procedure(s) performed: No    Critical Care performed: No ____________________________________________   INITIAL IMPRESSION / ASSESSMENT AND PLAN / ED COURSE  Pertinent labs & imaging results that were available during my care of the patient were reviewed by me and considered in my medical decision making (see chart for details).  56 year old male with a past medical history as noted above presents with several days of worsening shortness of breath, productive cough, fever, and malaise.  Review of past medical records in epic is noncontributory.  On exam, patient has low-grade fever, tachycardia, and borderline O2 sat but other vital signs are normal.  He is slightly uncomfortable but not in acute distress.  Lungs with trace wheezes bilaterally, but the rest of the exam is relatively unremarkable.  Differential includes pneumonia, bronchitis with possible concomitant COPD exacerbation, flu or other viral syndrome, versus less likely UTI or other source of infection.  I have a lower suspicion for primary cardiac cause given his fever and cough.  There is no  clinical evidence to support PE.  Plan for chest x-ray, infection workup, cardiac enzymes, and reassess.    ----------------------------------------- 12:56 PM on 09/10/2017 -----------------------------------------  Chest x-ray reveals right upper lobe pneumonia.  Patient has not been hospitalized for the last several months, so we will treat for community acquired pneumonia.  Patient is borderline hypoxic with  O2 sat 92% on 2 L.  Given his overall generalized weakness, the hypoxia, and the extent of the infiltrate, patient is not appropriate for discharge home.  Will admit for IV antibiotics.  ____________________________________________   FINAL CLINICAL IMPRESSION(S) / ED DIAGNOSES  Final diagnoses:  Community acquired pneumonia of right upper lobe of lung ([place])      NEW MEDICATIONS STARTED DURING THIS VISIT:  This SmartLink is deprecated. Use AVSMEDLIST instead to display the medication list for a patient.   Note:  This document was prepared using Dragon voice recognition software and may include unintentional dictation errors.    Arta Silence, MD 09/10/17 1257

## 2017-09-10 NOTE — Progress Notes (Signed)
Pharmacy Antibiotic Note  Scott Rosario is a 56 y.o. male admitted on 09/10/2017 with pneumonia.  Pharmacy has been consulted for Ceftriaxone dosing.  Plan: Will start Ceftriaxone 1g IV Q24H. Pharmacy will continue to follow.   Height: 5\' 8"  (172.7 cm) Weight: 195 lb (88.5 kg) IBW/kg (Calculated) : 68.4  Temp (24hrs), Avg:100 F (37.8 C), Min:99.7 F (37.6 C), Max:100.2 F (37.9 C)  Recent Labs  Lab 09/10/17 1107 09/10/17 1116  WBC 17.6*  --   CREATININE 0.94  --   LATICACIDVEN  --  2.0*    Estimated Creatinine Clearance: 92.6 mL/min (by C-G formula based on SCr of 0.94 mg/dL).    Allergies  Allergen Reactions  . Invokana [Canagliflozin] Other (See Comments)    adverse reactions: Fatigue and Zombie feeling     Antimicrobials this admission: Azithromycin 12/15 >> Ceftriaxone 12/15 >>  Dose adjustments this admission:   Microbiology results: BCx: 12/15 >> sent   Thank you for allowing pharmacy to be a part of this patient's care.  Lendon Ka, PharmD Pharmacy Resident 09/10/2017 4:26 PM

## 2017-09-10 NOTE — ED Notes (Signed)
Date and time results received: 09/10/17 12:42 PM  (use smartphrase ".now" to insert current time)  Test: Lactic Critical Value: 2.0  Name of Provider Notified: Dr. Cherylann Banas  Orders Received? Or Actions Taken?: Orders Received - See Orders for details

## 2017-09-10 NOTE — Progress Notes (Signed)
CODE SEPSIS - PHARMACY COMMUNICATION  **Broad Spectrum Antibiotics should be administered within 1 hour of Sepsis diagnosis**  Time Code Sepsis Called/Page Received: 12/15 1248  Antibiotics Ordered: Azithromycin, Ceftriaxone  Time of 1st antibiotic administration: 1254  Additional action taken by pharmacy:    If necessary, Name of Provider/Nurse Contacted:      Noralee Space ,PharmD Clinical Pharmacist  09/10/2017  1:29 PM

## 2017-09-10 NOTE — ED Triage Notes (Addendum)
Pt here via POV from home with reports of cough, vomiting after coughing hard, wheezing and increased shortness of breath since Monday.  Pt has hx of COPD and does not currently use any nebs or inhalers.  Pt has had subjective fevers and c/o chills and sweating.  Pt did vomit in lobby prior to triage.  Generalized body aches. Did not get a flu shot this year.

## 2017-09-10 NOTE — H&P (Signed)
[place] at [place] NAME: Stran Raper    MR#:  [id number]  DATE OF BIRTH:  10-Nov-1960  DATE OF ADMISSION:  09/10/2017  PRIMARY CARE PHYSICIAN: Arnetha Courser, MD   REQUESTING/REFERRING PHYSICIAN: Dr. Arta Silence  CHIEF COMPLAINT:   Chief Complaint  Patient presents with  . Shortness of Breath  . Cough  . Generalized Body Aches    HISTORY OF PRESENT ILLNESS:  Scott Rosario  is a 56 y.o. male with a known history of hypertension, CAD, hyperlipidemia, COPD not on home oxygen, ongoing smoking, history of pulmonary nodules, diabetes mellitus presented to the hospital secondary to weakness, worsening shortness of breath, wheezing and cough. Patient appears diaphoretic and very weak. Wife at bedside is providing most of the history. He continues to smoke about a pack every day. Probably has underlying COPD not on any inhalers or home oxygen at home. Feeling weak for almost 3-4 days, started as upper respiratory tract infection symptoms. Cough and shortness of breath were much worse since yesterday. Also occasional scattered wheezing, associated with malaise nausea and vomiting and presented to the hospital. Flu test is negative. Chest x-ray with large right upper lobe infiltrate. Being admitted for sepsis.  PAST MEDICAL HISTORY:   Past Medical History:  Diagnosis Date  . Alkaline phosphatase elevation   . Aortic atherosclerosis ([place])    on CT 02/25/14  . Asthma   . CAD (coronary artery disease)   . Cirrhosis ([employer])   . COPD (chronic obstructive pulmonary disease) ([place])   . Diabetes mellitus without complication ([place])   . Emphysema of lung ([place])   . GERD (gastroesophageal reflux disease)   . Heart attack ([place])   . Hyperlipidemia   . Hypertension   . Pulmonary nodules    x 3, (75mm, 54mm, 39mm) chest CT February 25, 2014  . Renal cyst    on CT 02/25/14  . Tobacco use     PAST SURGICAL HISTORY:   Past Surgical History:  Procedure  Laterality Date  . APPENDECTOMY    . CARDIAC CATHETERIZATION Left 03/09/2016   Procedure: Left Heart Cath and Coronary Angiography;  Surgeon: Corey Skains, MD;  Location: [facility];  Service: Cardiovascular;  Laterality: Left;  . CARDIAC CATHETERIZATION N/A 03/09/2016   Procedure: Coronary Stent Intervention;  Surgeon: Isaias Cowman, MD;  Location: [facility];  Service: Cardiovascular;  Laterality: N/A;  . CORONARY ANGIOPLASTY WITH STENT PLACEMENT  10/25/2003   x 2    SOCIAL HISTORY:   Social History   Tobacco Use  . Smoking status: Current Every Day Smoker    Packs/day: 1.00    Years: 40.00    Pack years: 40.00    Types: Cigarettes  . Smokeless tobacco: Never Used  Substance Use Topics  . Alcohol use: No    Alcohol/week: 0.0 oz    FAMILY HISTORY:   Family History  Problem Relation Age of Onset  . Diabetes Mother   . Hypertension Mother   . Arthritis Mother   . Hyperlipidemia Mother   . Heart attack Father   . Epilepsy Father   . Diabetes Father   . Seizures Father   . Heart disease Father   . Hyperlipidemia Father   . Hypertension Father   . Stroke Father   . Diabetes Brother   . Hyperlipidemia Brother   . Hypertension Brother   . Cancer Maternal Grandmother        unknown  .  Diabetes Paternal Grandmother   . Heart disease Paternal Grandmother   . Hyperlipidemia Paternal Grandmother   . Hypertension Paternal Grandmother   . Heart attack Paternal Grandfather     DRUG ALLERGIES:   Allergies  Allergen Reactions  . Invokana [Canagliflozin] Other (See Comments)    adverse reactions: Fatigue and Zombie feeling     REVIEW OF SYSTEMS:   Review of Systems  Constitutional: Positive for chills, diaphoresis and malaise/fatigue. Negative for fever and weight loss.  HENT: Positive for congestion and sore throat. Negative for ear discharge, ear pain, hearing loss and nosebleeds.   Eyes: Negative for blurred vision, double vision and  photophobia.  Respiratory: Positive for cough, shortness of breath and wheezing. Negative for hemoptysis.   Cardiovascular: Negative for chest pain, palpitations, orthopnea and leg swelling.  Gastrointestinal: Positive for nausea and vomiting. Negative for abdominal pain, constipation, diarrhea, heartburn and melena.  Genitourinary: Negative for dysuria, frequency and urgency.  Musculoskeletal: Positive for back pain and myalgias. Negative for neck pain.  Skin: Negative for rash.  Neurological: Positive for weakness. Negative for dizziness, tingling, tremors, sensory change, speech change, focal weakness and headaches.  Endo/Heme/Allergies: Does not bruise/bleed easily.  Psychiatric/Behavioral: Negative for depression.    MEDICATIONS AT HOME:   Prior to Admission medications   Medication Sig Start Date End Date Taking? Authorizing Provider  aspirin EC 81 MG tablet Take 81 mg by mouth daily.   Yes [provider]  atorvastatin (LIPITOR) 80 MG tablet TAKE 1 TABLET BY MOUTH AT BEDTIME 08/21/17  Yes [name], Satira Anis, MD  clopidogrel (PLAVIX) 75 MG tablet Take 75 mg by mouth daily.    Yes [provider]  glipiZIDE (GLUCOTROL XL) 10 MG 24 hr tablet TAKE 1 TABLET BY MOUTH ONCE DAILY 11/05/16  Yes [name], Satira Anis, MD  losartan (COZAAR) 50 MG tablet TAKE 1 TABLET BY MOUTH ONCE DAILY 08/21/17  Yes [name], Satira Anis, MD  metFORMIN (GLUCOPHAGE) 500 MG tablet Take 1 tablet (500 mg total) by mouth 2 (two) times daily with a meal. 05/25/17  Yes [name], Satira Anis, MD  metoprolol succinate (TOPROL-XL) 25 MG 24 hr tablet TAKE 1 TABLET BY MOUTH ONCE DAILY 11/05/16  Yes [name], Satira Anis, MD  naproxen sodium (ALEVE) 220 MG tablet Take 660 mg by mouth daily as needed (PAIN).   Yes [provider]  nitroGLYCERIN (NITROSTAT) 0.4 MG SL tablet Place 0.4 mg under the tongue every 5 (five) minutes as needed for chest pain.  03/06/16  Yes [provider]  pantoprazole (PROTONIX) 40 MG tablet  TAKE 1 TABLET BY MOUTH ONCE DAILY 08/21/17  Yes [name], Satira Anis, MD  sitaGLIPtin (JANUVIA) 100 MG tablet Take 1 tablet (100 mg total) by mouth daily. Patient not taking: Reported on 08/09/2017 05/25/17   Arnetha Courser, MD  varenicline (CHANTIX STARTING MONTH PAK) 0.5 MG X 11 & 1 MG X 42 tablet Take one 0.5 mg tablet by mouth once daily for 3 days, then increase to one 0.5 mg tablet twice daily for 4 days, then increase to one 1 mg tablet twice daily. Patient not taking: Reported on 09/10/2017 08/09/17   Arnetha Courser, MD      VITAL SIGNS:  Blood pressure (!) 96/51, pulse (!) 102, temperature 99.7 F (37.6 C), temperature source Oral, resp. rate (!) 31, height 5\' 8"  (1.727 m), weight 88.5 kg (195 lb), SpO2 96 %.  PHYSICAL EXAMINATION:   Physical Exam  GENERAL:  56 y.o.-year-old patient lying in the  bed with no acute distress. is diaphoretic EYES: Pupils equal, round, reactive to light and accommodation. No scleral icterus. Extraocular muscles intact.  HEENT: Head atraumatic, normocephalic. Oropharynx and nasopharynx clear.  NECK:  Supple, no jugular venous distention. No thyroid enlargement, no tenderness.  LUNGS: Normal breath sounds bilaterally, decreased right upper lobe breath sounds with scattered rhonchi, no wheezing, rales or crepitation. No use of accessory muscles of respiration.  CARDIOVASCULAR: S1, S2 normal. No murmurs, rubs, or gallops.  ABDOMEN: Soft, nontender, nondistended. Bowel sounds present. No organomegaly or mass.  EXTREMITIES: No pedal edema, cyanosis, or clubbing.  NEUROLOGIC: Cranial nerves II through XII are intact. Muscle strength 5/5 in all extremities. Sensation intact. Gait not checked. Global weakness noted PSYCHIATRIC: The patient is alert and oriented x 3.  SKIN: No obvious rash, lesion, or ulcer.   LABORATORY PANEL:   CBC Recent Labs  Lab 09/10/17 1107  WBC 17.6*  HGB 15.1  HCT 44.8  PLT 185    ------------------------------------------------------------------------------------------------------------------  Chemistries  Recent Labs  Lab 09/10/17 1107  NA 136  K 4.1  CL 102  CO2 24  GLUCOSE 216*  BUN 17  CREATININE 0.94  CALCIUM 8.9  AST 30  ALT 30  ALKPHOS 167*  BILITOT 3.1*   ------------------------------------------------------------------------------------------------------------------  Cardiac Enzymes Recent Labs  Lab 09/10/17 1107  TROPONINI <0.03   ------------------------------------------------------------------------------------------------------------------  RADIOLOGY:  Dg Chest 2 View  Result Date: 09/10/2017 CLINICAL DATA:  Weakness and fever for several days EXAM: CHEST  2 VIEW COMPARISON:  02/24/2017 FINDINGS: Cardiac shadow is within normal limits. Left lung is clear. The right lung demonstrates diffuse right upper lobe infiltrate consistent with pneumonia. No sizable effusion is seen. No bony abnormality is noted. IMPRESSION: Right upper lobe pneumonia. Followup PA and lateral chest X-ray is recommended in 3-4 weeks following trial of antibiotic therapy to ensure resolution and exclude underlying malignancy. Electronically Signed   By: Inez Catalina M.D.   On: 09/10/2017 12:19    EKG:   Orders placed or performed during the hospital encounter of 09/10/17  . EKG 12-Lead  . EKG 12-Lead    IMPRESSION AND PLAN:   Ildefonso Keaney  is a 56 y.o. male with a known history of hypertension, CAD, hyperlipidemia, COPD not on home oxygen, ongoing smoking, history of pulmonary nodules, diabetes mellitus presented to the hospital secondary to weakness, worsening shortness of breath, wheezing and cough.  #1 sepsis-secondary to community-acquired pneumonia. -Blood cultures pending. Leukocytosis, elevated lactic acid and tachypnea and tachycardia on admission. -Started on Rocephin and azithromycin -On 2 L oxygen which is acute need -Continue inhalers and  oxygen support as needed.  2. COPD-not on home oxygen, stable at this time. When necessary nebulizer treatments for now. -No indication for systemic steroids.  3. CAD- stable. Continue aspirin and Plavix, on losartan, metoprolol and statin Monitor troponins.  4. Diabetes mellitus-check A1c. On sliding scale insulin. Also continue metformin and glipizide  5. Tobacco use disorder- counseled. Started on nicotine patch  6. DVT prophylaxis-Lovenox     All the records are reviewed and case discussed with ED provider. Management plans discussed with the patient, family and they are in agreement.  CODE STATUS: Full Code  TOTAL TIME TAKING CARE OF THIS PATIENT: 50 minutes.    Gladstone Lighter M.D on 09/10/2017 at 1:54 PM  Between 7am to 6pm - Pager - [phone]  After 6pm go to [email or web address] - Proofreader  [facility]  Office  [phone]  CC: Primary care physician; Sanda Klein,  Satira Anis, MD

## 2017-09-10 NOTE — Progress Notes (Signed)
MD notified of critical lactic acid. No new orders given.

## 2017-09-10 NOTE — ED Notes (Signed)
Patient's wife came out to the nursing station, stating that patient was sweating and blood pressure dropped to 89/46. Patient is diaphoretic, repeat blood pressure was 100/46 after cuff was adjusted. Patient is alert and oriented. Dr. Cherylann Banas aware.

## 2017-09-11 LAB — CBC
HCT: 33.8 % — ABNORMAL LOW (ref 40.0–52.0)
HEMOGLOBIN: 11.4 g/dL — AB (ref 13.0–18.0)
MCH: 29.6 pg (ref 26.0–34.0)
MCHC: 33.6 g/dL (ref 32.0–36.0)
MCV: 88.2 fL (ref 80.0–100.0)
PLATELETS: 154 10*3/uL (ref 150–440)
RBC: 3.83 MIL/uL — AB (ref 4.40–5.90)
RDW: 13.4 % (ref 11.5–14.5)
WBC: 22.6 10*3/uL — AB (ref 3.8–10.6)

## 2017-09-11 LAB — GLUCOSE, CAPILLARY
GLUCOSE-CAPILLARY: 144 mg/dL — AB (ref 65–99)
Glucose-Capillary: 163 mg/dL — ABNORMAL HIGH (ref 65–99)
Glucose-Capillary: 100 mg/dL — ABNORMAL HIGH (ref 65–99)
Glucose-Capillary: 142 mg/dL — ABNORMAL HIGH (ref 65–99)

## 2017-09-11 LAB — CBC WITH DIFFERENTIAL/PLATELET
Basophils Absolute: 0.1 10*3/uL (ref 0–0.1)
Basophils Relative: 0 %
EOS PCT: 0 %
Eosinophils Absolute: 0 10*3/uL (ref 0–0.7)
HEMATOCRIT: 34.4 % — AB (ref 40.0–52.0)
Hemoglobin: 11.6 g/dL — ABNORMAL LOW (ref 13.0–18.0)
LYMPHS ABS: 0.9 10*3/uL — AB (ref 1.0–3.6)
LYMPHS PCT: 4 %
MCH: 29.6 pg (ref 26.0–34.0)
MCHC: 33.6 g/dL (ref 32.0–36.0)
MCV: 88.1 fL (ref 80.0–100.0)
MONO ABS: 0.9 10*3/uL (ref 0.2–1.0)
Monocytes Relative: 4 %
NEUTROS ABS: 19.1 10*3/uL — AB (ref 1.4–6.5)
Neutrophils Relative %: 92 %
PLATELETS: 160 10*3/uL (ref 150–440)
RBC: 3.91 MIL/uL — ABNORMAL LOW (ref 4.40–5.90)
RDW: 13.4 % (ref 11.5–14.5)
WBC: 21 10*3/uL — ABNORMAL HIGH (ref 3.8–10.6)

## 2017-09-11 LAB — BASIC METABOLIC PANEL
ANION GAP: 8 (ref 5–15)
BUN: 30 mg/dL — ABNORMAL HIGH (ref 6–20)
CALCIUM: 7.6 mg/dL — AB (ref 8.9–10.3)
CO2: 23 mmol/L (ref 22–32)
CREATININE: 1.26 mg/dL — AB (ref 0.61–1.24)
Chloride: 103 mmol/L (ref 101–111)
Glucose, Bld: 155 mg/dL — ABNORMAL HIGH (ref 65–99)
Potassium: 4.1 mmol/L (ref 3.5–5.1)
SODIUM: 134 mmol/L — AB (ref 135–145)

## 2017-09-11 LAB — LACTIC ACID, PLASMA: LACTIC ACID, VENOUS: 1.7 mmol/L (ref 0.5–1.9)

## 2017-09-11 LAB — TROPONIN I: TROPONIN I: 0.05 ng/mL — AB (ref <0.03)

## 2017-09-11 MED ORDER — SODIUM CHLORIDE 0.9 % IV BOLUS (SEPSIS)
1000.0000 mL | Freq: Once | INTRAVENOUS | Status: AC
  Administered 2017-09-11: 1000 mL via INTRAVENOUS

## 2017-09-11 MED ORDER — IPRATROPIUM-ALBUTEROL 0.5-2.5 (3) MG/3ML IN SOLN
3.0000 mL | RESPIRATORY_TRACT | Status: DC
  Administered 2017-09-11 – 2017-09-12 (×6): 3 mL via RESPIRATORY_TRACT
  Filled 2017-09-11 (×5): qty 3

## 2017-09-11 MED ORDER — GUAIFENESIN-DM 100-10 MG/5ML PO SYRP
5.0000 mL | ORAL_SOLUTION | ORAL | Status: DC | PRN
  Administered 2017-09-11 – 2017-09-13 (×2): 5 mL via ORAL
  Filled 2017-09-11 (×2): qty 5

## 2017-09-11 MED ORDER — HYDROCOD POLST-CPM POLST ER 10-8 MG/5ML PO SUER
5.0000 mL | Freq: Two times a day (BID) | ORAL | Status: DC
  Administered 2017-09-11 – 2017-09-13 (×5): 5 mL via ORAL
  Filled 2017-09-11 (×5): qty 5

## 2017-09-11 MED ORDER — HYDROCODONE-ACETAMINOPHEN 5-325 MG PO TABS: 1.0000 | ORAL_TABLET | Freq: Four times a day (QID) | ORAL | Status: DC | PRN

## 2017-09-11 NOTE — Progress Notes (Signed)
Spoke with Dr Marcille Blanco - reported lab results -no further orders concerning labs, informed that  pt was sleeping while  receiving bolus, nurse heard audible slight wheeze. Dr Marcille Blanco said okay to change fluids back over to rate of 75 ml /hr at that time. Will continue to monitor.

## 2017-09-11 NOTE — Progress Notes (Signed)
Per Dr. Vianne Bulls okay to place order for CBC following changes in hemoglobin.

## 2017-09-11 NOTE — Progress Notes (Signed)
Per Dr. Vianne Bulls okay to discontinue IV fluids even though patient has dark urine. If not already done send urine down to be cultured.

## 2017-09-11 NOTE — Plan of Care (Signed)
  Progressing Clinical Measurements: Ability to maintain a body temperature in the normal range will improve 09/11/2017 0454 - Progressing by Annastasia Haskins, Floyce Stakes, RN Pain Managment: General experience of comfort will improve 09/11/2017 0454 - Progressing by Zenna Traister, Floyce Stakes, RN

## 2017-09-11 NOTE — Progress Notes (Signed)
[place] at [place] NAME: Scott Rosario    MR#:  [id number]  DATE OF BIRTH:  February 28, 1961  SUBJECTIVE: Admitted for community-acquired pneumonia, COPD exacerbation.  High fever, hypotension yesterday, no blood.  Slightly better today but still has low-grade temperature at 99 Fahrenheit.  Patient has cough, pleuritic chest pain on the right side because of cough.    CHIEF COMPLAINT:   Chief Complaint  Patient presents with  . Shortness of Breath  . Cough  . Generalized Body Aches    REVIEW OF SYSTEMS:   ROS CONSTITUTIONAL: High fever yesterday. EYES: No blurred or double vision.  EARS, NOSE, AND THROAT: No tinnitus or ear pain.  RESPIRATORY: Cough, shortness of breath, wheezing, right pleuritic chest pain. CARDIOVASCULAR: No chest pain, orthopnea, edema.  GASTROINTESTINAL: No nausea, vomiting, diarrhea or abdominal pain.  GENITOURINARY: No dysuria, hematuria.  ENDOCRINE: No polyuria, nocturia,  HEMATOLOGY: No anemia, easy bruising or bleeding SKIN: No rash or lesion. MUSCULOSKELETAL: No joint pain or arthritis.   NEUROLOGIC: No tingling, numbness, weakness.  PSYCHIATRY: No anxiety or depression.   DRUG ALLERGIES:   Allergies  Allergen Reactions  . Invokana [Canagliflozin] Other (See Comments)    adverse reactions: Fatigue and Zombie feeling     VITALS:  Blood pressure (!) 116/59, pulse 100, temperature 99.6 F (37.6 C), temperature source Oral, resp. rate 18, height 5\' 8"  (1.727 m), weight 88.5 kg (195 lb), SpO2 97 %.  PHYSICAL EXAMINATION:  GENERAL:  56 y.o.-year-old patient lying in the bed , appears, uncomfortable because of pain in the chest and cough. EYES: Pupils equal, round, reactive to lightNo scleral icterus. Extraocular muscles intact.  HEENT: Head atraumatic, normocephalic. Oropharynx and nasopharynx clear.  NECK:  Supple, no jugular venous distention. No thyroid enlargement, no tenderness.  LUNGS:  Expiratory wheeze present in the right upper lobe.  Clear to auscultation  To  Rest  Of  areas. \CARDIOVASCULAR: S1, S2 normal. No murmurs, rubs, or gallops.  ABDOMEN: Soft, nontender, nondistended. Bowel sounds present. No organomegaly or mass.  EXTREMITIES: No pedal edema, cyanosis, or clubbing.  NEUROLOGIC: Cranial nerves II through XII are intact. Muscle strength 5/5 in all extremities. Sensation intact. Gait not checked.  PSYCHIATRIC: The patient is alert and oriented x 3.  SKIN: No obvious rash, lesion, or ulcer.    LABORATORY PANEL:   CBC Recent Labs  Lab 09/11/17 0351  WBC 22.6*  HGB 11.4*  HCT 33.8*  PLT 154   ------------------------------------------------------------------------------------------------------------------  Chemistries  Recent Labs  Lab 09/10/17 1107 09/11/17 0351  NA 136 134*  K 4.1 4.1  CL 102 103  CO2 24 23  GLUCOSE 216* 155*  BUN 17 30*  CREATININE 0.94 1.26*  CALCIUM 8.9 7.6*  AST 30  --   ALT 30  --   ALKPHOS 167*  --   BILITOT 3.1*  --    ------------------------------------------------------------------------------------------------------------------  Cardiac Enzymes Recent Labs  Lab 09/11/17 0351  TROPONINI 0.05*   ------------------------------------------------------------------------------------------------------------------  RADIOLOGY:  Dg Chest 2 View  Result Date: 09/10/2017 CLINICAL DATA:  Weakness and fever for several days EXAM: CHEST  2 VIEW COMPARISON:  02/24/2017 FINDINGS: Cardiac shadow is within normal limits. Left lung is clear. The right lung demonstrates diffuse right upper lobe infiltrate consistent with pneumonia. No sizable effusion is seen. No bony abnormality is noted. IMPRESSION: Right upper lobe pneumonia. Followup PA and lateral chest X-ray is recommended in 3-4 weeks following trial of antibiotic therapy to ensure resolution  and exclude underlying malignancy. Electronically Signed   By: Inez Catalina  M.D.   On: 09/10/2017 12:19    EKG:   Orders placed or performed during the hospital encounter of 09/10/17  . EKG 12-Lead  . EKG 12-Lead    ASSESSMENT AND PLAN:   #1 /sepsis secondary to community: Patient had leukocytosis, tachycardia, fever on admission.  Continue IV Rocephin, Zithromax, clinically he is slightly better than yesterday.  Continue bronchodilators Slightly worse leukocytosis today likely secondary to pneumonia and hopefully it will improve tomorrow.  So far  blood cultures are negative. 2.  Pleuritic chest pain on the right side secondary to pneumonia.  Patient also has severe cough.  Use Tussionex if BP allows, add Norco also. 3.  Tobacco abuse: Counseled to quit, patient and wife both mentioned that patient will not smoke again. 4.  History of CAD: Continue aspirin, Plavix, beta-blockers, statins, losartan.  #5 diabetes mellitus type 2: Blood sugars controlled, continue glipizide 10 mg daily, insulin with sliding scale coverage.  Discussed with patient's wife.  All the records are reviewed and case discussed with Care Management/Social Workerr. Management plans discussed with the patient, family and they are in agreement.  CODE STATUS:full  TOTAL TIME TAKING CARE OF THIS PATIENT:35 minutes.   POSSIBLE D/C IN 1-2 DAYS, DEPENDING ON CLINICAL CONDITION.   Epifanio Lesches M.D on 09/11/2017 at 10:14 AM  Between 7am to 6pm - Pager - [phone]  After 6pm go to [email or web address] - password [email or web address] [facility]  Office  [phone]  CC: Primary care physician; Arnetha Courser, MD   Note: This dictation was prepared with Dragon dictation along with smaller phrase technology. Any transcriptional errors that result from this process are unintentional.

## 2017-09-12 LAB — CBC WITH DIFFERENTIAL/PLATELET
BASOS ABS: 0 10*3/uL (ref 0–0.1)
Basophils Relative: 0 %
Eosinophils Absolute: 0 10*3/uL (ref 0–0.7)
Eosinophils Relative: 0 %
HEMATOCRIT: 32.8 % — AB (ref 40.0–52.0)
HEMOGLOBIN: 11 g/dL — AB (ref 13.0–18.0)
LYMPHS PCT: 5 %
Lymphs Abs: 0.8 10*3/uL — ABNORMAL LOW (ref 1.0–3.6)
MCH: 29.3 pg (ref 26.0–34.0)
MCHC: 33.7 g/dL (ref 32.0–36.0)
MCV: 86.9 fL (ref 80.0–100.0)
MONO ABS: 1 10*3/uL (ref 0.2–1.0)
MONOS PCT: 6 %
NEUTROS ABS: 15 10*3/uL — AB (ref 1.4–6.5)
NEUTROS PCT: 89 %
Platelets: 166 10*3/uL (ref 150–440)
RBC: 3.77 MIL/uL — ABNORMAL LOW (ref 4.40–5.90)
RDW: 13.3 % (ref 11.5–14.5)
WBC: 16.8 10*3/uL — ABNORMAL HIGH (ref 3.8–10.6)

## 2017-09-12 LAB — GLUCOSE, CAPILLARY
GLUCOSE-CAPILLARY: 128 mg/dL — AB (ref 65–99)
GLUCOSE-CAPILLARY: 206 mg/dL — AB (ref 65–99)
GLUCOSE-CAPILLARY: 250 mg/dL — AB (ref 65–99)
Glucose-Capillary: 161 mg/dL — ABNORMAL HIGH (ref 65–99)

## 2017-09-12 LAB — HIV ANTIBODY (ROUTINE TESTING W REFLEX): HIV SCREEN 4TH GENERATION: NONREACTIVE

## 2017-09-12 MED ORDER — IPRATROPIUM-ALBUTEROL 0.5-2.5 (3) MG/3ML IN SOLN
3.0000 mL | Freq: Four times a day (QID) | RESPIRATORY_TRACT | Status: DC
  Administered 2017-09-12 (×2): 3 mL via RESPIRATORY_TRACT
  Filled 2017-09-12 (×2): qty 3

## 2017-09-12 MED ORDER — CALCIUM CARBONATE ANTACID 500 MG PO CHEW
1.0000 | CHEWABLE_TABLET | Freq: Three times a day (TID) | ORAL | Status: DC
  Administered 2017-09-12 – 2017-09-13 (×2): 200 mg via ORAL
  Filled 2017-09-12 (×2): qty 1

## 2017-09-12 MED ORDER — IPRATROPIUM-ALBUTEROL 0.5-2.5 (3) MG/3ML IN SOLN
3.0000 mL | Freq: Three times a day (TID) | RESPIRATORY_TRACT | Status: DC
  Administered 2017-09-13 (×2): 3 mL via RESPIRATORY_TRACT
  Filled 2017-09-12 (×2): qty 3

## 2017-09-12 MED ORDER — IPRATROPIUM-ALBUTEROL 0.5-2.5 (3) MG/3ML IN SOLN: 3.0000 mL | RESPIRATORY_TRACT | Status: DC | PRN

## 2017-09-12 NOTE — Progress Notes (Signed)
[place] at [place] NAME: Scott Rosario    MR#:  [id number]  DATE OF BIRTH:  09/22/1961    CHIEF COMPLAINT:   Chief Complaint  Patient presents with  . Shortness of Breath  . Cough  . Generalized Body Aches   Feels better with his breathing.  Continues to have dry cough.  Temperature 100.3 today morning.  Off oxygen.  REVIEW OF SYSTEMS:   ROS CONSTITUTIONAL: Fatigue EYES: No blurred or double vision.  EARS, NOSE, AND THROAT: No tinnitus or ear pain.  RESPIRATORY: Cough, shortness of breath, wheezing, right pleuritic chest pain. CARDIOVASCULAR: No chest pain, orthopnea, edema.  GASTROINTESTINAL: No nausea, vomiting, diarrhea or abdominal pain.  GENITOURINARY: No dysuria, hematuria.  ENDOCRINE: No polyuria, nocturia,  HEMATOLOGY: No anemia, easy bruising or bleeding SKIN: No rash or lesion. MUSCULOSKELETAL: No joint pain or arthritis.   NEUROLOGIC: No tingling, numbness, weakness.  PSYCHIATRY: No anxiety or depression.   DRUG ALLERGIES:   Allergies  Allergen Reactions  . Invokana [Canagliflozin] Other (See Comments)    adverse reactions: Fatigue and Zombie feeling     VITALS:  Blood pressure (!) 109/59, pulse 95, temperature 98.8 F (37.1 C), temperature source Oral, resp. rate (!) 22, height 5\' 8"  (1.727 m), weight 88.5 kg (195 lb), SpO2 92 %.  PHYSICAL EXAMINATION:  GENERAL:  56 y.o.-year-old patient lying in the bed , appears, uncomfortable because of pain in the chest and cough. EYES: Pupils equal, round, reactive to lightNo scleral icterus. Extraocular muscles intact.  HEENT: Head atraumatic, normocephalic. Oropharynx and nasopharynx clear.  NECK:  Supple, no jugular venous distention. No thyroid enlargement, no tenderness.  LUNGS: Mild bilateral wheezing. CARDIOVASCULAR: S1, S2 normal. No murmurs, rubs, or gallops.  ABDOMEN: Soft, nontender, nondistended. Bowel sounds present. No organomegaly or mass.   EXTREMITIES: No pedal edema, cyanosis, or clubbing.  NEUROLOGIC: Cranial nerves II through XII are intact. Muscle strength 5/5 in all extremities. Sensation intact. Gait not checked.  PSYCHIATRIC: The patient is alert and oriented x 3.  SKIN: No obvious rash, lesion, or ulcer.    LABORATORY PANEL:   CBC Recent Labs  Lab 09/12/17 0855  WBC 16.8*  HGB 11.0*  HCT 32.8*  PLT 166   ------------------------------------------------------------------------------------------------------------------  Chemistries  Recent Labs  Lab 09/10/17 1107 09/11/17 0351  NA 136 134*  K 4.1 4.1  CL 102 103  CO2 24 23  GLUCOSE 216* 155*  BUN 17 30*  CREATININE 0.94 1.26*  CALCIUM 8.9 7.6*  AST 30  --   ALT 30  --   ALKPHOS 167*  --   BILITOT 3.1*  --    ------------------------------------------------------------------------------------------------------------------  Cardiac Enzymes Recent Labs  Lab 09/11/17 0351  TROPONINI 0.05*   ------------------------------------------------------------------------------------------------------------------  RADIOLOGY:  No results found.  EKG:   Orders placed or performed during the hospital encounter of 09/10/17  . EKG 12-Lead  . EKG 12-Lead    ASSESSMENT AND PLAN:   *Right-sided community-acquired pneumonia with  acute hypoxic respiratory failure and sepsis. On IV antibiotics.  Stopped IV fluids.  Continue nebulizers and wean off oxygen.  IV steroids. Likely discharge home tomorrow.  * Tobacco abuse: Counseled to quit on admission  *  History of CAD: Continue aspirin, Plavix, beta-blockers, statins, losartan.   * diabetes mellitus Sliding scale insulin.  Glipizide.   Discussed with patient's wife.  All the records are reviewed and case discussed with Care Management/Social Worker Management plans discussed with the patient, family  and they are in agreement.  CODE STATUS:full  TOTAL TIME TAKING CARE OF THIS PATIENT:35  minutes.   POSSIBLE D/C IN 1-2 DAYS, DEPENDING ON CLINICAL CONDITION.   Leia Alf Izic Stfort M.D on 09/12/2017 at 2:43 PM  Between 7am to 6pm - Pager - [phone]  After 6pm go to [email or web address] - password [email or web address] [facility]  Office  [phone]  CC: Primary care physician; Arnetha Courser, MD   Note: This dictation was prepared with Dragon dictation along with smaller phrase technology. Any transcriptional errors that result from this process are unintentional.

## 2017-09-13 ENCOUNTER — Inpatient Hospital Stay: Payer: Self-pay

## 2017-09-13 LAB — CBC WITH DIFFERENTIAL/PLATELET
BASOS ABS: 0 10*3/uL (ref 0–0.1)
BASOS PCT: 0 %
EOS ABS: 0.1 10*3/uL (ref 0–0.7)
EOS PCT: 1 %
HCT: 33.3 % — ABNORMAL LOW (ref 40.0–52.0)
Hemoglobin: 11.3 g/dL — ABNORMAL LOW (ref 13.0–18.0)
LYMPHS PCT: 7 %
Lymphs Abs: 0.9 10*3/uL — ABNORMAL LOW (ref 1.0–3.6)
MCH: 29.7 pg (ref 26.0–34.0)
MCHC: 34 g/dL (ref 32.0–36.0)
MCV: 87.2 fL (ref 80.0–100.0)
MONO ABS: 1.1 10*3/uL — AB (ref 0.2–1.0)
Monocytes Relative: 9 %
Neutro Abs: 9.6 10*3/uL — ABNORMAL HIGH (ref 1.4–6.5)
Neutrophils Relative %: 83 %
PLATELETS: 166 10*3/uL (ref 150–440)
RBC: 3.81 MIL/uL — AB (ref 4.40–5.90)
RDW: 13.4 % (ref 11.5–14.5)
WBC: 11.7 10*3/uL — AB (ref 3.8–10.6)

## 2017-09-13 LAB — URINE CULTURE: CULTURE: NO GROWTH

## 2017-09-13 LAB — GLUCOSE, CAPILLARY
GLUCOSE-CAPILLARY: 127 mg/dL — AB (ref 65–99)
GLUCOSE-CAPILLARY: 210 mg/dL — AB (ref 65–99)

## 2017-09-13 LAB — HEMOGLOBIN A1C
Hgb A1c MFr Bld: 9.4 % — ABNORMAL HIGH (ref 4.8–5.6)
MEAN PLASMA GLUCOSE: 223 mg/dL

## 2017-09-13 MED ORDER — LEVOFLOXACIN 250 MG PO TABS: 250.0000 mg | ORAL_TABLET | Freq: Every day | ORAL | 0 refills | Status: DC

## 2017-09-13 MED ORDER — PREDNISONE 50 MG PO TABS
50.0000 mg | ORAL_TABLET | Freq: Every day | ORAL | Status: DC
Start: 2017-09-13 — End: 2017-09-13
  Administered 2017-09-13: 50 mg via ORAL
  Filled 2017-09-13: qty 1

## 2017-09-13 MED ORDER — PREDNISONE 20 MG PO TABS: 40.0000 mg | ORAL_TABLET | Freq: Every day | ORAL | 0 refills | Status: DC

## 2017-09-13 MED ORDER — HYDROCOD POLST-CPM POLST ER 10-8 MG/5ML PO SUER: 5.0000 mL | Freq: Two times a day (BID) | ORAL | 0 refills | Status: DC

## 2017-09-13 MED ORDER — ALBUTEROL SULFATE HFA 108 (90 BASE) MCG/ACT IN AERS: 2.0000 | INHALATION_SPRAY | RESPIRATORY_TRACT | 0 refills | Status: DC | PRN

## 2017-09-13 NOTE — Progress Notes (Signed)
Pt ambulated in hallway and maintained O2 sats at 94 and above. Cleared for discharge per Dr Juanna Cao. Patient instructions given. All questions answered. IV removed. Patient walked to exit.

## 2017-09-13 NOTE — Discharge Instructions (Addendum)
QUIT SMOKING  Activity as tolerated.  Can return to work in 1 week

## 2017-09-15 LAB — CULTURE, BLOOD (ROUTINE X 2)
CULTURE: NO GROWTH
Culture: NO GROWTH

## 2017-09-16 ENCOUNTER — Ambulatory Visit (INDEPENDENT_AMBULATORY_CARE_PROVIDER_SITE_OTHER): Payer: Self-pay | Admitting: Family Medicine

## 2017-09-16 ENCOUNTER — Encounter: Payer: Self-pay | Admitting: Family Medicine

## 2017-09-16 VITALS — BP 140/62 | HR 83 | Temp 98.8°F | Ht 68.0 in | Wt 198.7 lb

## 2017-09-16 DIAGNOSIS — E119 Type 2 diabetes mellitus without complications: Secondary | ICD-10-CM

## 2017-09-16 DIAGNOSIS — R918 Other nonspecific abnormal finding of lung field: Secondary | ICD-10-CM

## 2017-09-16 DIAGNOSIS — I10 Essential (primary) hypertension: Secondary | ICD-10-CM

## 2017-09-16 DIAGNOSIS — J189 Pneumonia, unspecified organism: Secondary | ICD-10-CM

## 2017-09-16 DIAGNOSIS — K7469 Other cirrhosis of liver: Secondary | ICD-10-CM

## 2017-09-16 MED ORDER — METFORMIN HCL 500 MG PO TABS: 500.0000 mg | ORAL_TABLET | Freq: Every day | ORAL | 5 refills | Status: DC

## 2017-09-16 MED ORDER — SEMAGLUTIDE(0.25 OR 0.5MG/DOS) 2 MG/1.5ML ~~LOC~~ SOPN: 0.2500 mg | PEN_INJECTOR | SUBCUTANEOUS | 0 refills | Status: DC

## 2017-09-16 NOTE — Discharge Summary (Signed)
[facility] NAME: Scott Rosario    MR#:  [id number]  DATE OF BIRTH:  May 13, 1961  DATE OF ADMISSION:  09/10/2017 ADMITTING PHYSICIAN: Scott Lighter, MD  DATE OF DISCHARGE: 09/13/2017  3:58 PM  PRIMARY CARE PHYSICIAN: Scott Courser, MD   ADMISSION DIAGNOSIS:  Community acquired pneumonia of right upper lobe of lung ([place]) [J18.1]  DISCHARGE DIAGNOSIS:  Active Problems:   Sepsis ([place])   SECONDARY DIAGNOSIS:   Past Medical History:  Diagnosis Date  . Alkaline phosphatase elevation   . Aortic atherosclerosis ([place])    on CT 02/25/14  . Asthma   . CAD (coronary artery disease)   . Cirrhosis ([place])   . COPD (chronic obstructive pulmonary disease) ([place])   . Diabetes mellitus without complication ([place])   . Emphysema of lung ([place])   . GERD (gastroesophageal reflux disease)   . Heart attack ([place])   . Hyperlipidemia   . Hypertension   . Pulmonary nodules    x 3, (52mm, 76mm, 52mm) chest CT February 25, 2014  . Renal cyst    on CT 02/25/14  . Tobacco use      ADMITTING HISTORY  HISTORY OF PRESENT ILLNESS:  Scott Rosario  is a 56 y.o. male with a known history of hypertension, CAD, hyperlipidemia, COPD not on home oxygen, ongoing smoking, history of pulmonary nodules, diabetes mellitus presented to the hospital secondary to weakness, worsening shortness of breath, wheezing and cough. Patient appears diaphoretic and very weak. Wife at bedside is providing most of the history. He continues to smoke about a pack every day. Probably has underlying COPD not on any inhalers or home oxygen at home. Feeling weak for almost 3-4 days, started as upper respiratory tract infection symptoms. Cough and shortness of breath were much worse since yesterday. Also occasional scattered wheezing, associated with malaise nausea and vomiting and presented to the hospital. Flu test is negative. Chest x-ray with large right upper lobe infiltrate. Being admitted  for sepsis.  HOSPITAL COURSE:   *Right-sided community-acquired pneumonia with  acute hypoxic respiratory failure and sepsis. Treated with IV antibiotics in the hospital.  Cultures remain negative.  Weaned off oxygen by day of discharge with saturations 94% on ambulation.  Change to Levaquin at discharge.  Prednisone added.  * Tobacco abuse: Counseled to quit on admission  *  History of CAD: Continue aspirin, Plavix, beta-blockers, statins, losartan.   * diabetes mellitus  Glipizide.  Patient is stable for discharge home.  CONSULTS OBTAINED:    DRUG ALLERGIES:   Allergies  Allergen Reactions  . Invokana [Canagliflozin] Other (See Comments)    adverse reactions: Fatigue and Zombie feeling     DISCHARGE MEDICATIONS:   Allergies as of 09/13/2017      Reactions   Invokana [canagliflozin] Other (See Comments)   adverse reactions: Fatigue and Zombie feeling       Medication List    STOP taking these medications   varenicline 0.5 MG X 11 & 1 MG X 42 tablet Commonly known as:  CHANTIX STARTING MONTH PAK     TAKE these medications   albuterol 108 (90 Base) MCG/ACT inhaler Commonly known as:  PROVENTIL HFA;VENTOLIN HFA Inhale 2 puffs into the lungs every 4 (four) hours as needed for wheezing or shortness of breath.   aspirin EC 81 MG tablet Take 81 mg by mouth daily.   atorvastatin 80 MG tablet Commonly known as:  LIPITOR TAKE 1 TABLET BY MOUTH  AT BEDTIME   chlorpheniramine-HYDROcodone 10-8 MG/5ML Suer Commonly known as:  TUSSIONEX Take 5 mLs by mouth every 12 (twelve) hours.   clopidogrel 75 MG tablet Commonly known as:  PLAVIX Take 75 mg by mouth daily.   glipiZIDE 10 MG 24 hr tablet Commonly known as:  GLUCOTROL XL TAKE 1 TABLET BY MOUTH ONCE DAILY   levofloxacin 250 MG tablet Commonly known as:  LEVAQUIN Take 1 tablet (250 mg total) by mouth daily.   losartan 50 MG tablet Commonly known as:  COZAAR TAKE 1 TABLET BY MOUTH ONCE DAILY   metoprolol  succinate 25 MG 24 hr tablet Commonly known as:  TOPROL-XL TAKE 1 TABLET BY MOUTH ONCE DAILY   nitroGLYCERIN 0.4 MG SL tablet Commonly known as:  NITROSTAT Place 0.4 mg under the tongue every 5 (five) minutes as needed for chest pain.   pantoprazole 40 MG tablet Commonly known as:  PROTONIX TAKE 1 TABLET BY MOUTH ONCE DAILY   predniSONE 20 MG tablet Commonly known as:  DELTASONE Take 2 tablets (40 mg total) by mouth daily.   sitaGLIPtin 100 MG tablet Commonly known as:  JANUVIA Take 1 tablet (100 mg total) by mouth daily.       Today   VITAL SIGNS:  Blood pressure 132/63, pulse 75, temperature 98 F (36.7 C), temperature source Oral, resp. rate 18, height 5\' 8"  (1.727 m), weight 88.5 kg (195 lb), SpO2 96 %.  I/O:  No intake or output data in the 24 hours ending 09/16/17 1458  PHYSICAL EXAMINATION:  Physical Exam  GENERAL:  56 y.o.-year-old patient lying in the bed with no acute distress.  LUNGS: Normal breath sounds bilaterally, no wheezing, rales,rhonchi or crepitation. No use of accessory muscles of respiration.  CARDIOVASCULAR: S1, S2 normal. No murmurs, rubs, or gallops.  ABDOMEN: Soft, non-tender, non-distended. Bowel sounds present. No organomegaly or mass.  NEUROLOGIC: Moves all 4 extremities. PSYCHIATRIC: The patient is alert and oriented x 3.  SKIN: No obvious rash, lesion, or ulcer.   DATA REVIEW:   CBC Recent Labs  Lab 09/13/17 0426  WBC 11.7*  HGB 11.3*  HCT 33.3*  PLT 166    Chemistries  Recent Labs  Lab 09/10/17 1107 09/11/17 0351  NA 136 134*  K 4.1 4.1  CL 102 103  CO2 24 23  GLUCOSE 216* 155*  BUN 17 30*  CREATININE 0.94 1.26*  CALCIUM 8.9 7.6*  AST 30  --   ALT 30  --   ALKPHOS 167*  --   BILITOT 3.1*  --     Cardiac Enzymes Recent Labs  Lab 09/11/17 0351  TROPONINI 0.05*    Microbiology Results  Results for orders placed or performed during the hospital encounter of 09/10/17  Culture, blood (routine x 2)     Status:  None   Collection Time: 09/10/17 11:30 AM  Result Value Ref Range Status   Specimen Description BLOOD LEFT ARM  Final   Special Requests BOTTLES DRAWN AEROBIC AND ANAEROBIC BCAV  Final   Culture NO GROWTH 5 DAYS  Final   Report Status 09/15/2017 FINAL  Final  Culture, blood (routine x 2)     Status: None   Collection Time: 09/10/17 11:30 AM  Result Value Ref Range Status   Specimen Description BLOOD LEFT HAND  Final   Special Requests BOTTLES DRAWN AEROBIC AND ANAEROBIC BCAV  Final   Culture NO GROWTH 5 DAYS  Final   Report Status 09/15/2017 FINAL  Final  Urine Culture  Status: None   Collection Time: 09/10/17 11:30 PM  Result Value Ref Range Status   Specimen Description URINE, RANDOM  Final   Special Requests NONE  Final   Culture   Final    NO GROWTH Performed at [facility], [address] [place], [place] [address]    Report Status 09/13/2017 FINAL  Final    RADIOLOGY:  No results found.  Follow up with PCP in 1 week.  Management plans discussed with the patient, family and they are in agreement.  CODE STATUS:  Code Status History    Date Active Date Inactive Code Status Order ID Comments User Context   09/10/2017 15:51 09/13/2017 19:03 Full Code [id number]  Scott Lighter, MD Inpatient   03/09/2016 13:55 03/10/2016 12:08 Full Code [id number]  Isaias Cowman, MD Inpatient   03/05/2016 23:15 03/06/2016 16:48 Full Code [id number]  Quintella Baton, MD Inpatient      TOTAL TIME TAKING CARE OF THIS PATIENT ON DAY OF DISCHARGE: more than 30 minutes.   Leia Alf Xareni Kelch M.D on 09/16/2017 at 2:58 PM  Between 7am to 6pm - Pager - [phone]  After 6pm go to [email or web address] - password [email or web address] [facility]  Office  [phone]  CC: Primary care physician; Scott Courser, MD  Note: This dictation was prepared with Dragon dictation along with smaller phrase technology. Any transcriptional errors that result from this process are  unintentional.

## 2017-09-16 NOTE — Patient Instructions (Addendum)
It should be okay to take no more than 2,000 mg of tylenol daily, but check with your liver doctor first If you need something for aches or pains, try to use Tylenol (acetaminophen) instead of non-steroidals (which include Aleve, ibuprofen, Advil, Motrin, and naproxen); non-steroidals can cause long-term kidney damage and heart attack risk and elevated blood pressure Start the Ozempic once a week You have my blessing to STOP the glipizide as your sugars come down Have a chest xray done on or about January 8th Congratulations on stopping your cigarettes
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
## 2017-09-17 NOTE — Assessment & Plan Note (Signed)
Steroids may be playing a part; recheck BP was better; glad he has quit smoking; get through current illness; may need to adjust medicine if still elevated

## 2017-09-17 NOTE — Assessment & Plan Note (Signed)
Reviewed last CT scan of chest, May 2018: 1. Pulmonary nodules in the left lung, stable dating back to 2015 at this time considered definitively benign. No future imaging followup is recommended.

## 2017-09-17 NOTE — Assessment & Plan Note (Signed)
Patient wishes to start [place]; Rx and savings card provided; foot exam by MD today; reviewed last A1c with him; if sugars start to drop, we would want to discontinue the sulfonylurea

## 2017-09-17 NOTE — Assessment & Plan Note (Signed)
He can contact his gastroenterologist to see if it's okay to take up to 2,000 mg per day of acetaminophen (tylenol); check first, see AVS

## 2017-09-21 ENCOUNTER — Other Ambulatory Visit: Payer: Self-pay | Admitting: Family Medicine

## 2017-09-21 ENCOUNTER — Ambulatory Visit: Payer: Self-pay | Admitting: *Deleted

## 2017-09-21 ENCOUNTER — Telehealth: Payer: Self-pay | Admitting: Family Medicine

## 2017-09-21 MED ORDER — LEVOFLOXACIN 500 MG PO TABS: 500.0000 mg | ORAL_TABLET | Freq: Every day | ORAL | 0 refills | Status: DC

## 2017-09-21 NOTE — Telephone Encounter (Signed)
Can you please get him on the phone so I can talk to him? He did not go to the ER Staff reports that he walked in and scheduled an appointment for tomorrow

## 2017-09-21 NOTE — Progress Notes (Signed)
I spoke with patient Realized he was taking 250 mg levaquin after leaving the hospital He was treated in house with rocephin and azithromycin Patient says he is feeling a lot better today than yesterday Slept all day yesterday BP 140s and 150s He did not go to the ER as directed earlier (see [facility] note) He walked in and schedule appt with me tomorrow Will treat with levaquin 500 mg, Rx sent to [facility] Go to ER overnight if worse  Meds ordered this encounter  Medications  . levofloxacin (LEVAQUIN) 500 MG tablet    Sig: Take 1 tablet (500 mg total) by mouth daily. Don't take glipizide while on this medicine    Dispense:  7 tablet    Refill:  0
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
## 2017-09-21 NOTE — Telephone Encounter (Signed)
See other note Patient feeling better, but will come in tomorrow Will start higher dose of levaquin, as I see he was sent home on 250 mg daily appt in AM, to ER if worse

## 2017-09-22 ENCOUNTER — Ambulatory Visit
Admission: RE | Admit: 2017-09-22 | Discharge: 2017-09-22 | Disposition: A | Discharge status: Home / Self Care | Payer: Self-pay | Source: Ambulatory Visit | Attending: Family Medicine | Admitting: Family Medicine

## 2017-09-22 ENCOUNTER — Encounter: Payer: Self-pay | Admitting: Family Medicine

## 2017-09-22 ENCOUNTER — Telehealth: Payer: Self-pay

## 2017-09-22 ENCOUNTER — Ambulatory Visit (INDEPENDENT_AMBULATORY_CARE_PROVIDER_SITE_OTHER): Payer: Self-pay | Admitting: Family Medicine

## 2017-09-22 VITALS — BP 116/60 | HR 77 | Temp 98.6°F | Resp 16 | Wt 189.3 lb

## 2017-09-22 DIAGNOSIS — J189 Pneumonia, unspecified organism: Secondary | ICD-10-CM

## 2017-09-22 DIAGNOSIS — D72829 Elevated white blood cell count, unspecified: Secondary | ICD-10-CM

## 2017-09-22 DIAGNOSIS — E119 Type 2 diabetes mellitus without complications: Secondary | ICD-10-CM

## 2017-09-22 DIAGNOSIS — J440 Chronic obstructive pulmonary disease with acute lower respiratory infection: Secondary | ICD-10-CM | POA: Insufficient documentation

## 2017-09-22 DIAGNOSIS — E871 Hypo-osmolality and hyponatremia: Secondary | ICD-10-CM

## 2017-09-22 LAB — CBC WITH DIFFERENTIAL/PLATELET
BASOS PCT: 0.5 %
Basophils Absolute: 59 cells/uL (ref 0–200)
EOS PCT: 1.6 %
Eosinophils Absolute: 187 cells/uL (ref 15–500)
HCT: 41.9 % (ref 38.5–50.0)
HEMOGLOBIN: 14.1 g/dL (ref 13.2–17.1)
Lymphs Abs: 1732 cells/uL (ref 850–3900)
MCH: 29.4 pg (ref 27.0–33.0)
MCHC: 33.7 g/dL (ref 32.0–36.0)
MCV: 87.5 fL (ref 80.0–100.0)
MONOS PCT: 6.7 %
MPV: 9.9 fL (ref 7.5–12.5)
NEUTROS ABS: 8939 {cells}/uL — AB (ref 1500–7800)
Neutrophils Relative %: 76.4 %
PLATELETS: 393 10*3/uL (ref 140–400)
RBC: 4.79 10*6/uL (ref 4.20–5.80)
RDW: 12.5 % (ref 11.0–15.0)
TOTAL LYMPHOCYTE: 14.8 %
WBC mixed population: 784 cells/uL (ref 200–950)
WBC: 11.7 10*3/uL — ABNORMAL HIGH (ref 3.8–10.8)

## 2017-09-22 LAB — BASIC METABOLIC PANEL
BUN: 18 mg/dL (ref 7–25)
CALCIUM: 9.3 mg/dL (ref 8.6–10.3)
CO2: 27 mmol/L (ref 20–32)
Chloride: 99 mmol/L (ref 98–110)
Creat: 0.87 mg/dL (ref 0.70–1.33)
GLUCOSE: 286 mg/dL — AB (ref 65–139)
Potassium: 5.1 mmol/L (ref 3.5–5.3)
SODIUM: 133 mmol/L — AB (ref 135–146)

## 2017-09-22 MED ORDER — NITROGLYCERIN 0.4 MG SL SUBL: 0.4000 mg | SUBLINGUAL_TABLET | SUBLINGUAL | 5 refills | Status: DC | PRN

## 2017-09-22 NOTE — Patient Instructions (Signed)
Please go from here to the xray center across the street Rest and hydration Please do eat yogurt or kimchi or take a probiotic daily for the next month We want to replace the healthy germs in the gut If you notice foul, watery diarrhea in the next two months, schedule an appointment RIGHT AWAY or go to an urgent care or the emergency room if a holiday or over a weekend Let's get labs If you have not heard anything from my staff by tomorrow at lunch about any orders/referrals/studies from today, please contact us here to follow-up [phone]

## 2017-09-22 NOTE — Telephone Encounter (Signed)
-----   Message from Arnetha Courser, MD sent at 09/22/2017  4:21 PM EST ----- Please let the patient know that his white blood cell count is exactly the same as it was when he left the hospital; no better, no worse; continue the antibiotics; kidney function back to normal range; calcium back to normal; sodium a little low, if chest xray does not show complete resolution in a few weeks, we'll do more tests

## 2017-09-22 NOTE — Telephone Encounter (Signed)
Called pt no answer. Unable to leave message as no voicemail is set up. Will call again. CRM created. Results routed to [facility].

## 2017-09-22 NOTE — Progress Notes (Signed)
BP 116/60   Pulse 77   Temp 98.6 F (37 C) (Oral)   Resp 16   Wt 189 lb 4.8 oz (85.9 kg)   SpO2 99%   BMI 28.78 kg/m    Subjective:    Patient ID: Scott Rosario, male    DOB: [birth year], 56 y.o.   MRN: [id number]  HPI: Scott Rosario is a 56 y.o. male  Chief Complaint  Patient presents with  . Fatigue    hot flashes, no energy, headaches.  Slept all through Christmas, did not go to ER    HPI Patient is here for f/u; here with his wife Here for f/u; see phone note from yesterday; he was hospitalized with pneumonia, treated with antibiotics, then spent most of Christmas Day sleeping; wife worried; he did not go to ER though He had significant blood in his sputum in the hospital High WBC with LEFT shift Slightly low H/H Sugars up and down in the hospital; 117 to 377 at home recently Home sugars are high in the morning Meftformin was making him sick, on full dose now He was dehydrated when he went to the ER Lactic acid elevated and now back to normal Trop I was 0.05 on day of discharge  Now back on Levaquin at 500 mg daily Sweats this morning Coughing some but not nearly as much Dyspnea on exertion; walked to the barn and felt short of breath, pain in the center of the chest on Christmas Day; went to sleep, broke out in bad sweat See phone note; he did not go to the ER Seems to be better over last two days No longer coughing up any blood; last time was Dec 18th or 19th   Depression screen [facility] 2/9 09/22/2017 09/16/2017 08/09/2017 05/09/2017 01/24/2017  Decreased Interest 0 0 0 0 0  Down, Depressed, Hopeless 0 0 0 0 1  PHQ - 2 Score 0 0 0 0 1    Relevant past medical, surgical, family and social history reviewed Past Medical History:  Diagnosis Date  . Alkaline phosphatase elevation   . Aortic atherosclerosis ([place])    on CT 02/25/14  . Asthma   . CAD (coronary artery disease)   . Cirrhosis ([place])   . COPD (chronic obstructive pulmonary disease) ([place])   .  Diabetes mellitus without complication ([place])   . Emphysema of lung ([place])   . GERD (gastroesophageal reflux disease)   . Heart attack ([place])   . Hyperlipidemia   . Hypertension   . Pulmonary nodules    x 3, (28mm, 71mm, 6mm) chest CT February 25, 2014  . Renal cyst    on CT 02/25/14  . Tobacco use    Past Surgical History:  Procedure Laterality Date  . APPENDECTOMY    . CARDIAC CATHETERIZATION Left 03/09/2016   Procedure: Left Heart Cath and Coronary Angiography;  Surgeon: Corey Skains, MD;  Location: [facility];  Service: Cardiovascular;  Laterality: Left;  . CARDIAC CATHETERIZATION N/A 03/09/2016   Procedure: Coronary Stent Intervention;  Surgeon: Isaias Cowman, MD;  Location: [facility];  Service: Cardiovascular;  Laterality: N/A;  . CORONARY ANGIOPLASTY WITH STENT PLACEMENT  10/25/2003   x 2   Family History  Problem Relation Age of Onset  . Diabetes Mother   . Hypertension Mother   . Arthritis Mother   . Hyperlipidemia Mother   . Heart attack Father   . Epilepsy Father   . Diabetes Father   .  Seizures Father   . Heart disease Father   . Hyperlipidemia Father   . Hypertension Father   . Stroke Father   . Diabetes Brother   . Hyperlipidemia Brother   . Hypertension Brother   . Cancer Maternal Grandmother        unknown  . Diabetes Paternal Grandmother   . Heart disease Paternal Grandmother   . Hyperlipidemia Paternal Grandmother   . Hypertension Paternal Grandmother   . Heart attack Paternal Grandfather    Social History   Tobacco Use  . Smoking status: Former Smoker    Packs/day: 1.00    Years: 40.00    Pack years: 40.00    Types: Cigarettes    Last attempt to quit: 09/09/2017    Years since quitting: 0.0  . Smokeless tobacco: Never Used  Substance Use Topics  . Alcohol use: No    Alcohol/week: 0.0 oz  . Drug use: No    Interim medical history since last visit reviewed. Allergies and medications reviewed  Review of Systems Per  HPI unless specifically indicated above     Objective:    BP 116/60   Pulse 77   Temp 98.6 F (37 C) (Oral)   Resp 16   Wt 189 lb 4.8 oz (85.9 kg)   SpO2 99%   BMI 28.78 kg/m   Wt Readings from Last 3 Encounters:  09/22/17 189 lb 4.8 oz (85.9 kg)  09/16/17 198 lb 11.2 oz (90.1 kg)  09/10/17 195 lb (88.5 kg)    Physical Exam  Constitutional: He appears well-developed and well-nourished. No distress.  Eyes: No scleral icterus.  Neck: No JVD present.  Cardiovascular: Normal rate and regular rhythm.  Pulmonary/Chest: Effort normal. No respiratory distress. He has decreased breath sounds (RUL). He has no wheezes. He has no rhonchi. He has no rales.  Abdominal: He exhibits no distension.  Neurological: He is alert.  Skin: He is diaphoretic (mildly diaphoretic along hairline). No pallor.  Psychiatric: He has a normal mood and affect.   Diabetic Foot Form - Detailed   Diabetic Foot Exam - detailed Diabetic Foot exam was performed with the following findings:  Yes 09/22/2017  4:00 PM  Visual Foot Exam completed.:  Yes  Pulse Foot Exam completed.:  Yes  Right Dorsalis Pedis:  Present Left Dorsalis Pedis:  Present  Sensory Foot Exam Completed.:  Yes Semmes-Weinstein Monofilament Test R Site 1-Great Toe:  Pos L Site 1-Great Toe:  Pos        Results for orders placed or performed in visit on 09/22/17  CBC with Differential/Platelet  Result Value Ref Range   WBC 11.7 (H) 3.8 - 10.8 Thousand/uL   RBC 4.79 4.20 - 5.80 Million/uL   Hemoglobin 14.1 13.2 - 17.1 g/dL   HCT 41.9 38.5 - 50.0 %   MCV 87.5 80.0 - 100.0 fL   MCH 29.4 27.0 - 33.0 pg   MCHC 33.7 32.0 - 36.0 g/dL   RDW 12.5 11.0 - 15.0 %   Platelets 393 140 - 400 Thousand/uL   MPV 9.9 7.5 - 12.5 fL   Neutro Abs 8,939 (H) 1,500 - 7,800 cells/uL   Lymphs Abs 1,732 850 - 3,900 cells/uL   WBC mixed population 784 200 - 950 cells/uL   Eosinophils Absolute 187 15 - 500 cells/uL   Basophils Absolute 59 0 - 200 cells/uL    Neutrophils Relative % 76.4 %   Total Lymphocyte 14.8 %   Monocytes Relative 6.7 %   Eosinophils Relative  1.6 %   Basophils Relative 0.5 %  Basic metabolic panel  Result Value Ref Range   Glucose, Bld 286 (H) 65 - 139 mg/dL   BUN 18 7 - 25 mg/dL   Creat 0.87 0.70 - 1.33 mg/dL   BUN/Creatinine Ratio NOT APPLICABLE 6 - 22 (calc)   Sodium 133 (L) 135 - 146 mmol/L   Potassium 5.1 3.5 - 5.3 mmol/L   Chloride 99 98 - 110 mmol/L   CO2 27 20 - 32 mmol/L   Calcium 9.3 8.6 - 10.3 mg/dL      Assessment & Plan:   Problem List Items Addressed This Visit      Respiratory   Bilateral pneumonia - Primary    No sputum culture; now on levaquin 500 mg daily      Relevant Orders   DG Chest 2 View (Completed)     Endocrine   Diabetes mellitus without complication (HCC) (Chronic)    Watching sugars closely while sick       Other Visit Diagnoses    Leukocytosis, unspecified type       Relevant Orders   CBC with Differential/Platelet (Completed)   Hyponatremia       Relevant Orders   Basic metabolic panel (Completed)       Follow up plan: No Follow-up on file.  An after-visit summary was printed and given to the patient at [facility].  Please see the patient instructions which may contain other information and recommendations beyond what is mentioned above in the assessment and plan.  Meds ordered this encounter  Medications  . nitroGLYCERIN (NITROSTAT) 0.4 MG SL tablet    Sig: Place 1 tablet (0.4 mg total) under the tongue every 5 (five) minutes as needed for chest pain. Max of 3 total; call 911    Dispense:  25 tablet    Refill:  5    Orders Placed This Encounter  Procedures  . DG Chest 2 View  . CBC with Differential/Platelet  . Basic metabolic panel

## 2017-09-22 NOTE — Assessment & Plan Note (Signed)
No sputum culture; now on levaquin 500 mg daily

## 2017-09-22 NOTE — Assessment & Plan Note (Signed)
Watching sugars closely while sick

## 2017-09-23 ENCOUNTER — Other Ambulatory Visit: Payer: Self-pay

## 2017-09-23 ENCOUNTER — Inpatient Hospital Stay: Payer: Self-pay | Admitting: Family Medicine

## 2017-09-23 DIAGNOSIS — E119 Type 2 diabetes mellitus without complications: Secondary | ICD-10-CM

## 2017-09-23 NOTE — Telephone Encounter (Signed)
Called pt informed him of the information below as well as the need for additional lab tests and x-ray in a few weeks. Pt gave verbal understanding.

## 2017-10-05 ENCOUNTER — Telehealth: Payer: Self-pay | Admitting: Family Medicine

## 2017-10-05 NOTE — Telephone Encounter (Signed)
Copied from [place] [id number]. Topic: Quick Communication - See Telephone Encounter >> Oct 05, 2017  5:15 PM Corie Chiqu[place], [place] wrote: CRM for notification. See Telephone encounter for: Patients wife called because she would like to know if her husband could have his Pantoprazole dose increased. Also stated that they will be going out of town tomorrow and would like for this to be done as soon as possible. Wife would like it if someone would give them a call back if the medication dose increase at [phone]  10/05/17.

## 2017-10-06 MED ORDER — PANTOPRAZOLE SODIUM 40 MG PO TBEC: 40.0000 mg | DELAYED_RELEASE_TABLET | Freq: Two times a day (BID) | ORAL | 0 refills | Status: DC

## 2017-10-06 NOTE — Telephone Encounter (Signed)
Pt.notified

## 2017-10-06 NOTE — Telephone Encounter (Signed)
I'll just increase to twice a day short term, but please have him follow-up with his GI doctor; he may need to be scoped

## 2017-10-13 ENCOUNTER — Ambulatory Visit: Payer: Self-pay | Admitting: Gastroenterology

## 2017-10-13 ENCOUNTER — Ambulatory Visit (INDEPENDENT_AMBULATORY_CARE_PROVIDER_SITE_OTHER): Payer: Self-pay | Admitting: Gastroenterology

## 2017-10-13 ENCOUNTER — Other Ambulatory Visit: Payer: Self-pay

## 2017-10-13 ENCOUNTER — Encounter: Payer: Self-pay | Admitting: Gastroenterology

## 2017-10-13 VITALS — BP 136/71 | HR 82 | Ht 68.0 in | Wt 192.0 lb

## 2017-10-13 DIAGNOSIS — R748 Abnormal levels of other serum enzymes: Secondary | ICD-10-CM

## 2017-10-13 DIAGNOSIS — K2100 Gastro-esophageal reflux disease with esophagitis, without bleeding: Secondary | ICD-10-CM

## 2017-10-13 MED ORDER — PANTOPRAZOLE SODIUM 40 MG PO TBEC: 40.0000 mg | DELAYED_RELEASE_TABLET | Freq: Two times a day (BID) | ORAL | 5 refills | Status: DC

## 2017-10-13 NOTE — Progress Notes (Signed)
Primary Care Physician: Arnetha Courser, MD  Primary Gastroenterologist:  Dr. Lucilla Lame  Chief Complaint  Patient presents with  . Gastroesophageal Reflux    HPI: Scott Rosario is a 57 y.o. male here for follow-up after recently being in the hospital with pneumonia.  The patient states that since being in the hospital he has had worsening of his reflux symptoms. The patient was put on Protonix twice a day which was an increase from his once a day usual dose.  The patient states that he is better controlled with that but states that he now has some dysphagia with solids getting stuck at times.  The patient continues to smoke but states he has cut down.  There is no report of any black stools or bloody stools.  Current Outpatient Medications  Medication Sig Dispense Refill  . aspirin EC 81 MG tablet Take 81 mg by mouth daily.    Marland Kitchen atorvastatin (LIPITOR) 80 MG tablet TAKE 1 TABLET BY MOUTH AT BEDTIME 30 tablet 2  . clopidogrel (PLAVIX) 75 MG tablet Take 75 mg by mouth daily.     Marland Kitchen ibuprofen (ADVIL,MOTRIN) 200 MG tablet Take 200 mg by mouth every 6 (six) hours as needed.    Marland Kitchen losartan (COZAAR) 50 MG tablet TAKE 1 TABLET BY MOUTH ONCE DAILY 30 tablet 2  . metFORMIN (GLUCOPHAGE) 500 MG tablet Take 1 tablet (500 mg total) by mouth daily. Take 15-30 minutes once a day before biggest meal 30 tablet 5  . metoprolol succinate (TOPROL-XL) 25 MG 24 hr tablet TAKE 1 TABLET BY MOUTH ONCE DAILY 30 tablet 11  . nitroGLYCERIN (NITROSTAT) 0.4 MG SL tablet Place 1 tablet (0.4 mg total) under the tongue every 5 (five) minutes as needed for chest pain. Max of 3 total; call 911 25 tablet 5  . pantoprazole (PROTONIX) 40 MG tablet Take 1 tablet (40 mg total) by mouth 2 (two) times daily. 60 tablet 5  . albuterol (PROVENTIL HFA;VENTOLIN HFA) 108 (90 Base) MCG/ACT inhaler Inhale 2 puffs into the lungs every 4 (four) hours as needed for wheezing or shortness of breath. (Patient not taking: Reported on  10/13/2017) 1 Inhaler 0  . chlorpheniramine-HYDROcodone (TUSSIONEX) 10-8 MG/5ML SUER Take 5 mLs by mouth every 12 (twelve) hours. (Patient not taking: Reported on 09/22/2017) 140 mL 0  . levofloxacin (LEVAQUIN) 500 MG tablet Take 1 tablet (500 mg total) by mouth daily. Don't take glipizide while on this medicine (Patient not taking: Reported on 10/13/2017) 7 tablet 0  . Semaglutide (OZEMPIC) 0.25 or 0.5 MG/DOSE SOPN Inject 0.25 mg into the skin once a week. 2 pen 0  . sitaGLIPtin (JANUVIA) 100 MG tablet Take 1 tablet (100 mg total) by mouth daily. (Patient not taking: Reported on 09/22/2017) 30 tablet 2   No current facility-administered medications for this visit.     Allergies as of 10/13/2017 - Review Complete 10/13/2017  Allergen Reaction Noted  . Invokana [canagliflozin] Other (See Comments) 01/25/2017    ROS:  General: Negative for anorexia, weight loss, fever, chills, fatigue, weakness. ENT: Negative for hoarseness, difficulty swallowing , nasal congestion. CV: Negative for chest pain, angina, palpitations, dyspnea on exertion, peripheral edema.  Respiratory: Negative for dyspnea at rest, dyspnea on exertion, cough, sputum, wheezing.  GI: See history of present illness. GU:  Negative for dysuria, hematuria, urinary incontinence, urinary frequency, nocturnal urination.  Endo: Negative for unusual weight change.    Physical Examination:   BP 136/71   Pulse 82  Ht 5\' 8"  (1.727 m)   Wt 192 lb (87.1 kg)   BMI 29.19 kg/m   General: Well-nourished, well-developed in no acute distress.  Eyes: No icterus. Conjunctivae pink. Mouth: Oropharyngeal mucosa moist and pink , no lesions erythema or exudate. Lungs: Clear to auscultation bilaterally. Non-labored. Heart: Regular rate and rhythm, no murmurs rubs or gallops.  Abdomen: Bowel sounds are normal, nontender, nondistended, no hepatosplenomegaly or masses, no abdominal bruits or hernia , no rebound or guarding.   Extremities: No  lower extremity edema. No clubbing or deformities. Neuro: Alert and oriented x 3.  Grossly intact. Skin: Warm and dry, no jaundice.   Psych: Alert and cooperative, normal mood and affect.  Labs:    Imaging Studies: Dg Chest 2 View  Result Date: 09/22/2017 CLINICAL DATA:  Persistent cough, chest congestion, and weakness since diagnosed with pneumonia 12 days ago. History of asthma-COPD, recently stopped smoking. EXAM: CHEST  2 VIEW COMPARISON:  Chest x-ray of September 13, 2017 FINDINGS: The lungs are well-expanded. There has been marked improvement in the appearance of the pulmonary parenchyma on the right with near total resolution of interstitial and alveolar opacities. There are coarse lung markings lateral to the left heart border on today's study which are slightly more conspicuous than on the previous exam. The heart and pulmonary vascularity are normal. The mediastinum is normal in width. The trachea is midline. IMPRESSION: COPD. Marked improvement in the appearance of the chest since the previous study. The interstitial markings remain coarse in the lingula. An additional follow-up chest x-ray in 2-3 weeks is recommended assuming the patient continues to improve clinically. If the patient's symptoms should worsen, repeat chest x-ray then would be indicated. Electronically Signed   By: David  Martinique M.D.   On: 09/22/2017 15:01    Assessment and Plan:   Scott Rosario is a 57 y.o. y/o male who comes in today with a history of abnormal liver enzymes with chronic GERD.  The patient is being controlled on Protonix twice a day and will continue this.  The patient will also have his liver enzymes rechecked and will be set up for an EGD because of his dysphagia. I have discussed risks & benefits which include, but are not limited to, bleeding, infection, perforation & drug reaction.  The patient agrees with this plan & written consent will be obtained.       Lucilla Lame, MD. [facility]al   Note:  This dictation was prepared with Dragon dictation along with smaller phrase technology. Any transcriptional errors that result from this process are unintentional.

## 2017-10-14 LAB — HEPATIC FUNCTION PANEL
ALBUMIN: 4.6 g/dL (ref 3.5–5.5)
ALK PHOS: 154 IU/L — AB (ref 39–117)
ALT: 30 IU/L (ref 0–44)
AST: 18 IU/L (ref 0–40)
Bilirubin Total: 0.9 mg/dL (ref 0.0–1.2)
Bilirubin, Direct: 0.25 mg/dL (ref 0.00–0.40)
Total Protein: 6.9 g/dL (ref 6.0–8.5)

## 2017-10-17 ENCOUNTER — Other Ambulatory Visit: Payer: Self-pay

## 2017-10-18 ENCOUNTER — Encounter: Payer: Self-pay | Admitting: *Deleted

## 2017-10-18 ENCOUNTER — Telehealth: Payer: Self-pay | Admitting: Family Medicine

## 2017-10-18 ENCOUNTER — Ambulatory Visit: Payer: Self-pay | Admitting: *Deleted

## 2017-10-18 ENCOUNTER — Other Ambulatory Visit: Payer: Self-pay

## 2017-10-18 DIAGNOSIS — R197 Diarrhea, unspecified: Secondary | ICD-10-CM | POA: Insufficient documentation

## 2017-10-18 NOTE — Telephone Encounter (Signed)
Pt's wife called, pt is having n/v, reflux and diarrhea. This has been going on for 2 weeks. They feel like it is one of his medications that he is taking, the semaglutide injection.  He is scheduled for an endoscopy next week, but having GI symptoms now. Message sent to office per protocol.  Appointment made.   Reason for Disposition . Taking prescription medication that could cause nausea (e.g., narcotics/opiates, antibiotics, OCPs, many others)  Answer Assessment - Initial Assessment Questions 1. NAUSEA SEVERITY: "How bad is the nausea?" (e.g., mild, moderate, severe; dehydration, weight loss)   - MILD: loss of appetite without change in eating habits   - MODERATE: decreased oral intake without significant weight loss, dehydration, or malnutrition   - SEVERE: inadequate caloric or fluid intake, significant weight loss, symptoms of dehydration     severe 2. ONSET: "When did the nausea begin?"      2 weeks but has gotten worse 3. VOMITING: "Any vomiting?" If so, ask: "How many times today?"     yes 4. RECURRENT SYMPTOM: "Have you had nausea before?" If so, ask: "When was the last time?" "What happened that time?"     First time 5. CAUSE: "What do you think is causing the nausea?"     semaglutide injection 6. PREGNANCY: "Is there any chance you are pregnant?" (e.g., unprotected intercourse, missed birth control pill, broken condom)     n/a  Protocols used: NAUSEA-A-AH

## 2017-10-18 NOTE — Assessment & Plan Note (Signed)
C diff testing tomorrow

## 2017-10-18 NOTE — Telephone Encounter (Signed)
I called patient after seeing the phone note He took the pen Friday last week He has been sick since getting out of the hospital He is having acid reflux too; he went to see the GI doctor He is going to do an EGD on Monday at the hospital to look at the esophagus, acid reflux and food getting stuck when he swallows, good sized pills get stuck; hot dogs get stuck STOP the Ozempic STOP the metformin until eating and drinking normally again; do not take if dehydrated STOP ibuprofen, as that can worsen acid reflux, can cause ulcer, perforation I asked if he thinks he has abdominal pain like pancreatitis; he is having a lot of gas, and once relieved, the pressure goes away (this is gas from below) Go straight to ER if boring pain through epigastrium, risk of pancreatitis It's been one thing after the other since his pneumonia, he says

## 2017-10-19 ENCOUNTER — Ambulatory Visit (INDEPENDENT_AMBULATORY_CARE_PROVIDER_SITE_OTHER): Payer: Self-pay | Admitting: Family Medicine

## 2017-10-19 ENCOUNTER — Encounter: Payer: Self-pay | Admitting: Family Medicine

## 2017-10-19 VITALS — BP 102/64 | HR 96 | Temp 98.1°F | Ht 68.0 in | Wt 191.6 lb

## 2017-10-19 DIAGNOSIS — I2583 Coronary atherosclerosis due to lipid rich plaque: Secondary | ICD-10-CM

## 2017-10-19 DIAGNOSIS — J439 Emphysema, unspecified: Secondary | ICD-10-CM

## 2017-10-19 DIAGNOSIS — E119 Type 2 diabetes mellitus without complications: Secondary | ICD-10-CM

## 2017-10-19 DIAGNOSIS — I2510 Atherosclerotic heart disease of native coronary artery without angina pectoris: Secondary | ICD-10-CM

## 2017-10-19 DIAGNOSIS — R112 Nausea with vomiting, unspecified: Secondary | ICD-10-CM

## 2017-10-19 DIAGNOSIS — Z720 Tobacco use: Secondary | ICD-10-CM

## 2017-10-19 DIAGNOSIS — R197 Diarrhea, unspecified: Secondary | ICD-10-CM

## 2017-10-19 DIAGNOSIS — K7469 Other cirrhosis of liver: Secondary | ICD-10-CM

## 2017-10-19 MED ORDER — ALBUTEROL SULFATE HFA 108 (90 BASE) MCG/ACT IN AERS: 2.0000 | INHALATION_SPRAY | RESPIRATORY_TRACT | 1 refills | Status: DC | PRN

## 2017-10-19 NOTE — Progress Notes (Signed)
BP 102/64 (BP Location: Left Arm, Patient Position: Sitting, Cuff Size: Large)   Pulse 96   Temp 98.1 F (36.7 C) (Oral)   Ht 5\' 8"  (1.727 m)   Wt 191 lb 9.6 oz (86.9 kg)   SpO2 98%   BMI 29.13 kg/m    Subjective:    Patient ID: Scott Rosario, male    DOB: 1961/04/25, 57 y.o.   MRN: [id number]  HPI: Scott Rosario is a 57 y.o. male  Chief Complaint  Patient presents with  . Emesis    Pt states that the last time he vomited was yesterday     HPI Patient is here for an acute visit; see last night's phone note; I called him last night after seeing a note that he had been vomiting and having diarrhea for five days He says that he feels much better today; no nausea or vomiting since last night No diarrhea since yesterday; he has not yet had a formed stool today, though He believes the ozempic was the cause; he would have been due for his next shot tomorrow He has stopped that and has stopped his metformin temporarily after I talked with him last night about NOT taking it if vomiting and diarrhea and dehydrated He goes to the cardiologist today for clearance/evaluation before procedure with GI on Monday Smoked just a couple of cigs over the last whole month; down from 2 packs a day No chest pain He is going to try harder on his diet to take better care of his diabetes  Depression screen [facility] 2/9 10/19/2017 09/22/2017 09/16/2017 08/09/2017 05/09/2017  Decreased Interest 0 0 0 0 0  Down, Depressed, Hopeless 0 0 0 0 0  PHQ - 2 Score 0 0 0 0 0    Relevant past medical, surgical, family and social history reviewed Past Medical History:  Diagnosis Date  . Alkaline phosphatase elevation   . Aortic atherosclerosis ([place])    on CT 02/25/14  . Asthma   . CAD (coronary artery disease)   . Cirrhosis ([place])   . COPD (chronic obstructive pulmonary disease) ([place])   . Diabetes mellitus without complication ([place])   . Emphysema of lung ([place])   . GERD (gastroesophageal reflux  disease)   . Heart attack ([place])    2005, 2017 (stent after each)  . Hyperlipidemia   . Hypertension   . Pulmonary nodules    x 3, (26mm, 85mm, 69mm) chest CT February 25, 2014  . Renal cyst    on CT 02/25/14  . Tobacco use    Past Surgical History:  Procedure Laterality Date  . APPENDECTOMY    . CARDIAC CATHETERIZATION Left 03/09/2016   Procedure: Left Heart Cath and Coronary Angiography;  Surgeon: Corey Skains, MD;  Location: [place] [facility];  Service: Cardiovascular;  Laterality: Left;  . CARDIAC CATHETERIZATION N/A 03/09/2016   Procedure: Coronary Stent Intervention;  Surgeon: Isaias Cowman, MD;  Location: [place] [facility];  Service: Cardiovascular;  Laterality: N/A;  . CORONARY ANGIOPLASTY WITH STENT PLACEMENT  10/25/2003   x 2   Family History  Problem Relation Age of Onset  . Diabetes Mother   . Hypertension Mother   . Arthritis Mother   . Hyperlipidemia Mother   . Heart attack Father   . Epilepsy Father   . Diabetes Father   . Seizures Father   . Heart disease Father   . Hyperlipidemia Father   . Hypertension Father   . Stroke Father   .  Diabetes Brother   . Hyperlipidemia Brother   . Hypertension Brother   . Cancer Maternal Grandmother        unknown  . Diabetes Paternal Grandmother   . Heart disease Paternal Grandmother   . Hyperlipidemia Paternal Grandmother   . Hypertension Paternal Grandmother   . Heart attack Paternal Grandfather    Social History   Tobacco Use  . Smoking status: Former Smoker    Packs/day: 1.00    Years: 40.00    Pack years: 40.00    Types: Cigarettes    Last attempt to quit: 09/09/2017    Years since quitting: 0.1  . Smokeless tobacco: Never Used  . Tobacco comment: (10/18/17 - currently 6-10 cigs/day)  Substance Use Topics  . Alcohol use: No    Alcohol/week: 0.0 oz  . Drug use: No  MD note: patient says he has smoked just a few cigarettes over the last few weeks, but for the part, he says that he has quit; his GI  doctor told him that's like being "almost pregnant"  Interim medical history since last visit reviewed. Allergies and medications reviewed  Review of Systems Per HPI unless specifically indicated above     Objective:    BP 102/64 (BP Location: Left Arm, Patient Position: Sitting, Cuff Size: Large)   Pulse 96   Temp 98.1 F (36.7 C) (Oral)   Ht 5\' 8"  (1.727 m)   Wt 191 lb 9.6 oz (86.9 kg)   SpO2 98%   BMI 29.13 kg/m   Wt Readings from Last 3 Encounters:  10/19/17 191 lb 9.6 oz (86.9 kg)  10/13/17 192 lb (87.1 kg)  09/22/17 189 lb 4.8 oz (85.9 kg)    Physical Exam  Constitutional: He appears well-developed and well-nourished. No distress.  Eyes: No scleral icterus.  Cardiovascular: Normal rate and regular rhythm.  Pulmonary/Chest: Effort normal and breath sounds normal.  Abdominal: Soft. Bowel sounds are normal. He exhibits no distension. There is no tenderness. There is no guarding.  Neurological: He is alert.  Skin: No pallor.  Psychiatric: He has a normal mood and affect.   Diabetic Foot Form - Detailed   Diabetic Foot Exam - detailed Diabetic Foot exam was performed with the following findings:  Yes 10/19/2017 10:18 AM  Visual Foot Exam completed.:  Yes  Pulse Foot Exam completed.:  Yes  Right Dorsalis Pedis:  Present Left Dorsalis Pedis:  Present  Sensory Foot Exam Completed.:  Yes Semmes-Weinstein Monofilament Test R Site 1-Great Toe:  Pos L Site 1-Great Toe:  Pos    Comments:  Callus along heel     Results for orders placed or performed in visit on 10/13/17  Hepatic function panel  Result Value Ref Range   Total Protein 6.9 6.0 - 8.5 g/dL   Albumin 4.6 3.5 - 5.5 g/dL   Bilirubin Total 0.9 0.0 - 1.2 mg/dL   Bilirubin, Direct 0.25 0.00 - 0.40 mg/dL   Alkaline Phosphatase 154 (H) 39 - 117 IU/L   AST 18 0 - 40 IU/L   ALT 30 0 - 44 IU/L      Assessment & Plan:   Problem List Items Addressed This Visit      Cardiovascular and Mediastinum   CAD (coronary  artery disease) (Chronic)    Going to see heart doctor at 10 am today prior to GI procedure planned for Monday        Respiratory   COPD (chronic obstructive pulmonary disease) (HCC) (Chronic)    Refill  of SABA provided for him to take one to procedure; we discussed his smoking and he's really trying to quit      Relevant Medications   albuterol (PROVENTIL HFA;VENTOLIN HFA) 108 (90 Base) MCG/ACT inhaler     Digestive   Cirrhosis (HCC) (Chronic)    Seeing GI on Monday; this needs to be considered with therapy for his diabetes        Endocrine   Diabetes mellitus without complication ([place]) (Chronic)    Foot exam today by MD; STOP GLP-1; advised him to NOT take metformin if dehydrated, vomiting, etc (risk of lactic acidosis); will see how he does and consider other therapy if needed; he will try very hard to work on his diet        Other   Tobacco use (Chronic)    Patient is really trying to quit      Diarrhea    considered C diff last night on the phone given recent pneumonia and ABX use; he reports it has completely resolved today; let me know if returns       Other Visit Diagnoses    Non-intractable vomiting with nausea, unspecified vomiting type    -  Primary   most likely due to the medicine (GLP-1); added to "allergy" list, advised pt to never take again; do not suspect pancreatitis; seems to be resolving well       Follow up plan: No Follow-up on file.  An after-visit summary was printed and given to the patient at [place].  Please see the patient instructions which may contain other information and recommendations beyond what is mentioned above in the assessment and plan.  Meds ordered this encounter  Medications  . albuterol (PROVENTIL HFA;VENTOLIN HFA) 108 (90 Base) MCG/ACT inhaler    Sig: Inhale 2 puffs into the lungs every 4 (four) hours as needed for wheezing or shortness of breath.    Dispense:  1 Inhaler    Refill:  1    No orders of the defined  types were placed in this encounter.

## 2017-10-19 NOTE — Assessment & Plan Note (Signed)
considered C diff last night on the phone given recent pneumonia and ABX use; he reports it has completely resolved today; let me know if returns

## 2017-10-19 NOTE — Patient Instructions (Signed)
You can can restart your metformin if eating and drinking well Do not take it in the future if dehydrated or very sick

## 2017-10-19 NOTE — Assessment & Plan Note (Signed)
Seeing GI on Monday; this needs to be considered with therapy for his diabetes

## 2017-10-19 NOTE — Assessment & Plan Note (Signed)
Going to see heart doctor at 10 am today prior to GI procedure planned for Monday

## 2017-10-19 NOTE — Assessment & Plan Note (Signed)
Refill of SABA provided for him to take one to procedure; we discussed his smoking and he's really trying to quit

## 2017-10-19 NOTE — Assessment & Plan Note (Signed)
Patient is really trying to quit

## 2017-10-19 NOTE — Assessment & Plan Note (Signed)
Foot exam today by MD; STOP GLP-1; advised him to NOT take metformin if dehydrated, vomiting, etc (risk of lactic acidosis); will see how he does and consider other therapy if needed; he will try very hard to work on his diet

## 2017-10-21 NOTE — Anesthesia Preprocedure Evaluation (Addendum)
Anesthesia Evaluation  Patient identified by MRN, date of birth, ID band Patient awake    Reviewed: Allergy & Precautions, NPO status , Patient's Chart, lab work & pertinent test results  History of Anesthesia Complications Negative for: history of anesthetic complications  Airway Mallampati: I  TM Distance: >3 FB Neck ROM: Full    Dental  (+)    Pulmonary COPD, Current Smoker (smokes 1/2 ppd),  Snoring    Pulmonary exam normal breath sounds clear to auscultation       Cardiovascular Exercise Tolerance: Good hypertension, + CAD, + Past MI (2005, 2017) and + Cardiac Stents  Normal cardiovascular exam Rhythm:Regular Rate:Normal  Cardiology note 10/19/17:  Plan  -Continue current medical regimen for hypertension control stable at this time without side effects or symptoms  -Continue moderate to high intensive cholesterol therapy for further future risk reduction in cardiovascular disease  -Continue significant risk factor modification medications and therapy listed above for coronary artery disease which appears clinically stable at this time  -Patient was counseled on the importance of complete and total abstinence from all forms of tobacco.  -The patient is considered at lowest risk possible for planned GI procedure next week, with exercise treadmill test today showing good exercise tolerance and no evidence of arrhythmia or ischemia. -May discontinue Plavix 7 days prior to the procedure, but continue daily low-dose aspirin indefinitely.    Neuro/Psych negative neurological ROS     GI/Hepatic GERD  ,  Endo/Other  diabetes, Type 2  Renal/GU negative Renal ROS     Musculoskeletal   Abdominal   Peds  Hematology negative hematology ROS (+)   Anesthesia Other Findings   Reproductive/Obstetrics                            Anesthesia Physical Anesthesia Plan  ASA: III  Anesthesia Plan: General    Post-op Pain Management:    Induction: Intravenous  PONV Risk Score and Plan: 2 and TIVA and Propofol infusion  Airway Management Planned: Natural Airway  Additional Equipment:   Intra-op Plan:   Post-operative Plan:   Informed Consent: I have reviewed the patients History and Physical, chart, labs and discussed the procedure including the risks, benefits and alternatives for the proposed anesthesia with the patient or authorized representative who has indicated his/her understanding and acceptance.     Plan Discussed with: CRNA  Anesthesia Plan Comments:         Anesthesia Quick Evaluation

## 2017-10-21 NOTE — Discharge Instructions (Signed)
General Anesthesia, Adult, Care After °These instructions provide you with information about caring for yourself after your procedure. Your health care provider may also give you more specific instructions. Your treatment has been planned according to current medical practices, but problems sometimes occur. Call your health care provider if you have any problems or questions after your procedure. °What can I expect after the procedure? °After the procedure, it is common to have: °· Vomiting. °· A sore throat. °· Mental slowness. ° °It is common to feel: °· Nauseous. °· Cold or shivery. °· Sleepy. °· Tired. °· Sore or achy, even in parts of your body where you did not have surgery. ° °Follow these instructions at home: °For at least 24 hours after the procedure: °· Do not: °? Participate in activities where you could fall or become injured. °? Drive. °? Use heavy machinery. °? Drink alcohol. °? Take sleeping pills or medicines that cause drowsiness. °? Make important decisions or sign legal documents. °? Take care of children on your own. °· Rest. °Eating and drinking °· If you vomit, drink water, juice, or soup when you can drink without vomiting. °· Drink enough fluid to keep your urine clear or pale yellow. °· Make sure you have little or no nausea before eating solid foods. °· Follow the diet recommended by your health care provider. °General instructions °· Have a responsible adult stay with you until you are awake and alert. °· Return to your normal activities as told by your health care provider. Ask your health care provider what activities are safe for you. °· Take over-the-counter and prescription medicines only as told by your health care provider. °· If you smoke, do not smoke without supervision. °· Keep all follow-up visits as told by your health care provider. This is important. °Contact a health care provider if: °· You continue to have nausea or vomiting at home, and medicines are not helpful. °· You  cannot drink fluids or start eating again. °· You cannot urinate after 8-12 hours. °· You develop a skin rash. °· You have fever. °· You have increasing redness at the site of your procedure. °Get help right away if: °· You have difficulty breathing. °· You have chest pain. °· You have unexpected bleeding. °· You feel that you are having a life-threatening or urgent problem. °This information is not intended to replace advice given to you by your health care provider. Make sure you discuss any questions you have with your health care provider. °Document Released: 12/20/2000 Document Revised: 02/16/2016 Document Reviewed: 08/28/2015 °Elsevier Interactive Patient Education © 2018 [employer] ° °

## 2017-10-24 ENCOUNTER — Encounter
Admission: RE | Disposition: A | Discharge status: Home / Self Care | Payer: Self-pay | Source: Ambulatory Visit | Attending: Gastroenterology

## 2017-10-24 ENCOUNTER — Ambulatory Visit: Payer: Self-pay | Admitting: Student in an Organized Health Care Education/Training Program

## 2017-10-24 ENCOUNTER — Ambulatory Visit
Admission: RE | Admit: 2017-10-24 | Discharge: 2017-10-24 | Disposition: A | Discharge status: Home / Self Care | Payer: Self-pay | Source: Ambulatory Visit | Attending: Gastroenterology | Admitting: Gastroenterology

## 2017-10-24 DIAGNOSIS — J449 Chronic obstructive pulmonary disease, unspecified: Secondary | ICD-10-CM | POA: Insufficient documentation

## 2017-10-24 DIAGNOSIS — K2100 Gastro-esophageal reflux disease with esophagitis, without bleeding: Secondary | ICD-10-CM

## 2017-10-24 DIAGNOSIS — K219 Gastro-esophageal reflux disease without esophagitis: Secondary | ICD-10-CM | POA: Insufficient documentation

## 2017-10-24 DIAGNOSIS — Z79899 Other long term (current) drug therapy: Secondary | ICD-10-CM | POA: Insufficient documentation

## 2017-10-24 DIAGNOSIS — Z7984 Long term (current) use of oral hypoglycemic drugs: Secondary | ICD-10-CM | POA: Insufficient documentation

## 2017-10-24 DIAGNOSIS — K2970 Gastritis, unspecified, without bleeding: Secondary | ICD-10-CM

## 2017-10-24 DIAGNOSIS — F17210 Nicotine dependence, cigarettes, uncomplicated: Secondary | ICD-10-CM | POA: Insufficient documentation

## 2017-10-24 DIAGNOSIS — I2510 Atherosclerotic heart disease of native coronary artery without angina pectoris: Secondary | ICD-10-CM | POA: Insufficient documentation

## 2017-10-24 DIAGNOSIS — I252 Old myocardial infarction: Secondary | ICD-10-CM | POA: Insufficient documentation

## 2017-10-24 DIAGNOSIS — Z7982 Long term (current) use of aspirin: Secondary | ICD-10-CM | POA: Insufficient documentation

## 2017-10-24 DIAGNOSIS — E119 Type 2 diabetes mellitus without complications: Secondary | ICD-10-CM | POA: Insufficient documentation

## 2017-10-24 DIAGNOSIS — K7460 Unspecified cirrhosis of liver: Secondary | ICD-10-CM | POA: Insufficient documentation

## 2017-10-24 DIAGNOSIS — I10 Essential (primary) hypertension: Secondary | ICD-10-CM | POA: Insufficient documentation

## 2017-10-24 DIAGNOSIS — E785 Hyperlipidemia, unspecified: Secondary | ICD-10-CM | POA: Insufficient documentation

## 2017-10-24 HISTORY — PX: ESOPHAGOGASTRODUODENOSCOPY (EGD) WITH PROPOFOL: SHX5813

## 2017-10-24 LAB — GLUCOSE, CAPILLARY
Glucose-Capillary: 250 mg/dL — ABNORMAL HIGH (ref 65–99)
Glucose-Capillary: 241 mg/dL — ABNORMAL HIGH (ref 65–99)

## 2017-10-24 SURGERY — ESOPHAGOGASTRODUODENOSCOPY (EGD) WITH PROPOFOL
Anesthesia: General | Site: Esophagus | Wound class: Clean Contaminated

## 2017-10-24 MED ORDER — ONDANSETRON HCL 4 MG/2ML IJ SOLN: 4.0000 mg | Freq: Once | INTRAMUSCULAR | Status: DC | PRN

## 2017-10-24 MED ORDER — ACETAMINOPHEN 325 MG PO TABS: 650.0000 mg | ORAL_TABLET | Freq: Once | ORAL | Status: DC | PRN

## 2017-10-24 MED ORDER — LIDOCAINE HCL (CARDIAC) 20 MG/ML IV SOLN
INTRAVENOUS | Status: DC | PRN
  Administered 2017-10-24: 40 mg via INTRAVENOUS

## 2017-10-24 MED ORDER — LACTATED RINGERS IV SOLN
INTRAVENOUS | Status: DC
  Administered 2017-10-24: 07:00:00 via INTRAVENOUS

## 2017-10-24 MED ORDER — SODIUM CHLORIDE 0.9 % IV SOLN: INTRAVENOUS | Status: DC

## 2017-10-24 MED ORDER — GLYCOPYRROLATE 0.2 MG/ML IJ SOLN
INTRAMUSCULAR | Status: DC | PRN
  Administered 2017-10-24: 0.2 mg via INTRAVENOUS

## 2017-10-24 MED ORDER — PROPOFOL 10 MG/ML IV BOLUS
INTRAVENOUS | Status: DC | PRN
  Administered 2017-10-24: 100 mg via INTRAVENOUS
  Administered 2017-10-24: 30 mg via INTRAVENOUS
  Administered 2017-10-24: 50 mg via INTRAVENOUS

## 2017-10-24 MED ORDER — ACETAMINOPHEN 160 MG/5ML PO SOLN: 325.0000 mg | ORAL | Status: DC | PRN

## 2017-10-24 SURGICAL SUPPLY — 33 items
BALLN DILATOR 10-12 8 (BALLOONS)
BALLN DILATOR 12-15 8 (BALLOONS)
BALLN DILATOR 15-18 8 (BALLOONS)
BALLN DILATOR CRE 0-12 8 (BALLOONS)
BALLN DILATOR ESOPH 8 10 CRE (MISCELLANEOUS) IMPLANT
BALLOON DILATOR 12-15 8 (BALLOONS) IMPLANT
BALLOON DILATOR 15-18 8 (BALLOONS) IMPLANT
BALLOON DILATOR CRE 0-12 8 (BALLOONS) IMPLANT
BLOCK BITE 60FR ADLT L/F GRN (MISCELLANEOUS) ×2 IMPLANT
CANISTER SUCT 1200ML W/VALVE (MISCELLANEOUS) ×2 IMPLANT
CLIP HMST 235XBRD CATH ROT (MISCELLANEOUS) IMPLANT
CLIP RESOLUTION 360 11X235 (MISCELLANEOUS)
ELECT REM PT RETURN 9FT ADLT (ELECTROSURGICAL)
ELECTRODE REM PT RTRN 9FT ADLT (ELECTROSURGICAL) IMPLANT
FCP ESCP3.2XJMB 240X2.8X (MISCELLANEOUS)
FORCEPS BIOP RAD 4 LRG CAP 4 (CUTTING FORCEPS) ×2 IMPLANT
FORCEPS BIOP RJ4 240 W/NDL (MISCELLANEOUS)
FORCEPS ESCP3.2XJMB 240X2.8X (MISCELLANEOUS) IMPLANT
GOWN CVR UNV OPN BCK APRN NK (MISCELLANEOUS) ×2 IMPLANT
GOWN ISOL THUMB LOOP REG UNIV (MISCELLANEOUS) ×2
INJECTOR VARIJECT VIN23 (MISCELLANEOUS) IMPLANT
KIT DEFENDO VALVE AND CONN (KITS) IMPLANT
KIT ENDO PROCEDURE OLY (KITS) ×2 IMPLANT
MARKER SPOT ENDO TATTOO 5ML (MISCELLANEOUS) IMPLANT
RETRIEVER NET PLAT FOOD (MISCELLANEOUS) IMPLANT
SNARE SHORT THROW 13M SML OVAL (MISCELLANEOUS) IMPLANT
SNARE SHORT THROW 30M LRG OVAL (MISCELLANEOUS) IMPLANT
SPOT EX ENDOSCOPIC TATTOO (MISCELLANEOUS)
SYR INFLATION 60ML (SYRINGE) IMPLANT
TRAP ETRAP POLY (MISCELLANEOUS) IMPLANT
VARIJECT INJECTOR VIN23 (MISCELLANEOUS)
WATER STERILE IRR 250ML POUR (IV SOLUTION) ×2 IMPLANT
WIRE CRE 18-20MM 8CM F G (MISCELLANEOUS) IMPLANT

## 2017-10-24 NOTE — Anesthesia Postprocedure Evaluation (Signed)
Anesthesia Post Note  Patient: Scott Rosario  Procedure(s) Performed: ESOPHAGOGASTRODUODENOSCOPY (EGD) WITH PROPOFOL (N/A Esophagus)  Patient location during evaluation: PACU Anesthesia Type: General Level of consciousness: awake and alert, oriented and patient cooperative Pain management: pain level controlled Vital Signs Assessment: post-procedure vital signs reviewed and stable Respiratory status: spontaneous breathing, nonlabored ventilation and respiratory function stable Cardiovascular status: blood pressure returned to baseline and stable Postop Assessment: adequate PO intake Anesthetic complications: no    Darrin Nipper

## 2017-10-24 NOTE — Anesthesia Procedure Notes (Signed)
Procedure Name: MAC Date/Time: 10/24/2017 7:44 AM Performed by: Janna Arch, CRNA Pre-anesthesia Checklist: Patient identified, Emergency Drugs available, Suction available and Patient being monitored Patient Re-evaluated:Patient Re-evaluated prior to induction Oxygen Delivery Method: Nasal cannula

## 2017-10-24 NOTE — Op Note (Addendum)
[facility]astroenter[facility] Patient Name: Scott Rosario Procedure Date: 10/24/2017 7:33 AM MRN: [id number] Account #: [id number] Date of Birth: October 01, 1960 Admit Type: Outpatient Age: 57 Room: [facility] OR ROOM 01 Gender: Male Note Status: Supervisor Override Procedure:            Upper GI endoscopy Indications:          Heartburn Providers:            Lucilla Lame MD, MD Referring MD:         Arnetha Courser (Referring MD) Medicines:            Propofol per Anesthesia Complications:        No immediate complications. Procedure:            Pre-Anesthesia Assessment:                       - Prior to the procedure, a History and Physical was                        performed, and patient medications and allergies were                        reviewed. The patient's tolerance of previous                        anesthesia was also reviewed. The risks and benefits of                        the procedure and the sedation options and risks were                        discussed with the patient. All questions were                        answered, and informed consent was obtained. Prior                        Anticoagulants: The patient has taken no previous                        anticoagulant or antiplatelet agents. ASA Grade                        Assessment: II - A patient with mild systemic disease.                        After reviewing the risks and benefits, the patient was                        deemed in satisfactory condition to undergo the                        procedure.                       After obtaining informed consent, the endoscope was                        passed under direct vision. Throughout the procedure,  the patient's blood pressure, pulse, and oxygen                        saturations were monitored continuously. The Olympus                        [id number]-HQ190 Endoscope (S#. [id number]) was introduced                        through  the mouth, and advanced to the second part of                        duodenum. The upper GI endoscopy was accomplished                        without difficulty. The patient tolerated the procedure                        well. Findings:      The examined esophagus was normal.      Localized mild inflammation characterized by erythema was found in the       gastric antrum. Biopsies were taken with a cold forceps for histology.      The examined duodenum was normal. Impression:           - Normal esophagus.                       - Gastritis. Biopsied.                       - Normal examined duodenum. Recommendation:       - Await pathology results.                       - Discharge patient to home.                       - Resume previous diet.                       - Continue present medications. Procedure Code(s):    --- Professional ---                       [phone], Esophagogastroduodenoscopy, flexible, transoral;                        with biopsy, single or multiple Diagnosis Code(s):    --- Professional ---                       R12, Heartburn                       K29.70, Gastritis, unspecified, without bleeding CPT copyright 2018 [employer]. All rights reserved. The codes documented in this report are preliminary and upon coder review may  be revised to meet current compliance requirements. Lucilla Lame MD, MD 10/24/2017 7:53:51 AM This report has been signed electronically. Number of Addenda: 0 Note Initiated On: 10/24/2017 7:33 AM      [facility]

## 2017-10-24 NOTE — H&P (Signed)
Lucilla Lame, MD [address] [place], [place] [address] [phone] Fax : [phone]  Primary Care Physician:  Arnetha Courser, MD Primary Gastroenterologist:  Dr. Allen Norris  Pre-Procedure History & Physical: HPI:  Scott Rosario is a 57 y.o. male is here for an endoscopy.   Past Medical History:  Diagnosis Date  . Alkaline phosphatase elevation   . Aortic atherosclerosis ([place])    on CT 02/25/14  . Asthma   . CAD (coronary artery disease)   . Cirrhosis ([place])   . COPD (chronic obstructive pulmonary disease) ([place])   . Diabetes mellitus without complication ([place])   . Emphysema of lung ([place])   . GERD (gastroesophageal reflux disease)   . Heart attack (Between)    2005, 2017 (stent after each)  . Hyperlipidemia   . Hypertension   . Pulmonary nodules    x 3, (74mm, 49mm, 35mm) chest CT February 25, 2014  . Renal cyst    on CT 02/25/14  . Tobacco use     Past Surgical History:  Procedure Laterality Date  . APPENDECTOMY    . CARDIAC CATHETERIZATION Left 03/09/2016   Procedure: Left Heart Cath and Coronary Angiography;  Surgeon: Corey Skains, MD;  Location: [facility];  Service: Cardiovascular;  Laterality: Left;  . CARDIAC CATHETERIZATION N/A 03/09/2016   Procedure: Coronary Stent Intervention;  Surgeon: Isaias Cowman, MD;  Location: [facility];  Service: Cardiovascular;  Laterality: N/A;  . CORONARY ANGIOPLASTY WITH STENT PLACEMENT  10/25/2003   x 2    Prior to Admission medications   Medication Sig Start Date End Date Taking? Authorizing Provider  albuterol (PROVENTIL HFA;VENTOLIN HFA) 108 (90 Base) MCG/ACT inhaler Inhale 2 puffs into the lungs every 4 (four) hours as needed for wheezing or shortness of breath. 10/19/17  Yes [name], Satira Anis, MD  Ascorbic Acid (VITAMIN C PO) Take by mouth.   Yes [provider]  aspirin EC 81 MG tablet Take 81 mg by mouth daily.   Yes [provider]  atorvastatin (LIPITOR) 80 MG  tablet TAKE 1 TABLET BY MOUTH AT BEDTIME 08/21/17  Yes [name], Satira Anis, MD  clopidogrel (PLAVIX) 75 MG tablet Take 75 mg by mouth daily.    Yes [provider]  ibuprofen (ADVIL,MOTRIN) 200 MG tablet Take 200 mg by mouth every 6 (six) hours as needed.   Yes [provider]  losartan (COZAAR) 50 MG tablet TAKE 1 TABLET BY MOUTH ONCE DAILY 08/21/17  Yes [name], Satira Anis, MD  metFORMIN (GLUCOPHAGE) 500 MG tablet Take 1 tablet (500 mg total) by mouth daily. Take 15-30 minutes once a day before biggest meal 09/16/17  Yes [name], Satira Anis, MD  metoprolol succinate (TOPROL-XL) 25 MG 24 hr tablet TAKE 1 TABLET BY MOUTH ONCE DAILY 11/05/16  Yes [name], Satira Anis, MD  nitroGLYCERIN (NITROSTAT) 0.4 MG SL tablet Place 1 tablet (0.4 mg total) under the tongue every 5 (five) minutes as needed for chest pain. Max of 3 total; call 911 09/22/17  Yes [name], Satira Anis, MD  pantoprazole (PROTONIX) 40 MG tablet Take 1 tablet (40 mg total) by mouth 2 (two) times daily. 10/13/17  Yes Lucilla Lame, MD    Allergies as of 10/13/2017 - Review Complete 10/13/2017  Allergen Reaction Noted  . Invokana [canagliflozin] Other (See Comments) 01/25/2017    Family History  Problem Relation Age of Onset  . Diabetes Mother   . Hypertension Mother   . Arthritis Mother   .  Hyperlipidemia Mother   . Heart attack Father   . Epilepsy Father   . Diabetes Father   . Seizures Father   . Heart disease Father   . Hyperlipidemia Father   . Hypertension Father   . Stroke Father   . Diabetes Brother   . Hyperlipidemia Brother   . Hypertension Brother   . Cancer Maternal Grandmother        unknown  . Diabetes Paternal Grandmother   . Heart disease Paternal Grandmother   . Hyperlipidemia Paternal Grandmother   . Hypertension Paternal Grandmother   . Heart attack Paternal Grandfather     Social History   Socioeconomic History  . Marital status: Married    Spouse name: Not on file  . Number of children: Not on file   . Years of education: Not on file  . Highest education level: Not on file  Social Needs  . Financial resource strain: Not on file  . Food insecurity - worry: Not on file  . Food insecurity - inability: Not on file  . Transportation needs - medical: Not on file  . Transportation needs - non-medical: Not on file  Occupational History  . Not on file  Tobacco Use  . Smoking status: Former Smoker    Packs/day: 1.00    Years: 40.00    Pack years: 40.00    Types: Cigarettes    Last attempt to quit: 09/09/2017    Years since quitting: 0.1  . Smokeless tobacco: Never Used  . Tobacco comment: (10/18/17 - currently 6-10 cigs/day)  Substance and Sexual Activity  . Alcohol use: No    Alcohol/week: 0.0 oz  . Drug use: No  . Sexual activity: Yes  Other Topics Concern  . Not on file  Social History Narrative   Active and independent at baseline    Review of Systems: See HPI, otherwise negative ROS  Physical Exam: BP 135/75   Pulse 77   Temp 98.1 F (36.7 C) (Temporal)   Resp 16   Ht 5\' 8"  (1.727 m)   Wt 191 lb (86.6 kg)   SpO2 98%   BMI 29.04 kg/m  General:   Alert,  pleasant and cooperative in NAD Head:  Normocephalic and atraumatic. Neck:  Supple; no masses or thyromegaly. Lungs:  Clear throughout to auscultation.    Heart:  Regular rate and rhythm. Abdomen:  Soft, nontender and nondistended. Normal bowel sounds, without guarding, and without rebound.   Neurologic:  Alert and  oriented x4;  grossly normal neurologically.  Impression/Plan: Scott Rosario is here for an endoscopy to be performed for GERD  Risks, benefits, limitations, and alternatives regarding  endoscopy have been reviewed with the patient.  Questions have been answered.  All parties agreeable.   Lucilla Lame, MD  10/24/2017, 7:37 AM

## 2017-10-24 NOTE — Transfer of Care (Signed)
Immediate Anesthesia Transfer of Care Note  Patient: Kanai Berrios  Procedure(s) Performed: ESOPHAGOGASTRODUODENOSCOPY (EGD) WITH PROPOFOL (N/A Esophagus)  Patient Location: PACU  Anesthesia Type: General  Level of Consciousness: awake, alert  and patient cooperative  Airway and Oxygen Therapy: Patient Spontanous Breathing and Patient connected to supplemental oxygen  Post-op Assessment: Post-op Vital signs reviewed, Patient's Cardiovascular Status Stable, Respiratory Function Stable, Patent Airway and No signs of Nausea or vomiting  Post-op Vital Signs: Reviewed and stable  Complications: No apparent anesthesia complications

## 2017-10-25 ENCOUNTER — Encounter: Payer: Self-pay | Admitting: Gastroenterology

## 2017-10-26 ENCOUNTER — Encounter: Payer: Self-pay | Admitting: Gastroenterology

## 2017-10-27 ENCOUNTER — Other Ambulatory Visit: Payer: Self-pay

## 2017-10-27 MED ORDER — AMOXICILLIN 500 MG PO CAPS: 1000.0000 mg | ORAL_CAPSULE | Freq: Two times a day (BID) | ORAL | 0 refills | Status: DC

## 2017-10-27 MED ORDER — CLARITHROMYCIN 500 MG PO TABS: 500.0000 mg | ORAL_TABLET | Freq: Two times a day (BID) | ORAL | 0 refills | Status: DC

## 2017-11-03 ENCOUNTER — Other Ambulatory Visit: Payer: Self-pay | Admitting: Family Medicine

## 2017-11-03 DIAGNOSIS — E782 Mixed hyperlipidemia: Secondary | ICD-10-CM

## 2017-11-10 ENCOUNTER — Ambulatory Visit: Payer: Self-pay | Admitting: Family Medicine

## 2017-12-06 ENCOUNTER — Other Ambulatory Visit: Payer: Self-pay | Admitting: Family Medicine

## 2017-12-06 NOTE — Telephone Encounter (Signed)
Please call patient Request received for glipizide Not on med list Has he started back taking it? How are his sugars? Thank you

## 2017-12-07 NOTE — Telephone Encounter (Signed)
Pt states has been taking it and has not had any low sugars

## 2017-12-19 ENCOUNTER — Ambulatory Visit: Payer: Self-pay | Admitting: Family Medicine

## 2017-12-20 ENCOUNTER — Ambulatory Visit (INDEPENDENT_AMBULATORY_CARE_PROVIDER_SITE_OTHER): Payer: Self-pay | Admitting: Family Medicine

## 2017-12-20 ENCOUNTER — Encounter: Payer: Self-pay | Admitting: Family Medicine

## 2017-12-20 DIAGNOSIS — I2510 Atherosclerotic heart disease of native coronary artery without angina pectoris: Secondary | ICD-10-CM

## 2017-12-20 DIAGNOSIS — I10 Essential (primary) hypertension: Secondary | ICD-10-CM

## 2017-12-20 DIAGNOSIS — E663 Overweight: Secondary | ICD-10-CM

## 2017-12-20 DIAGNOSIS — E782 Mixed hyperlipidemia: Secondary | ICD-10-CM

## 2017-12-20 DIAGNOSIS — Z5181 Encounter for therapeutic drug level monitoring: Secondary | ICD-10-CM

## 2017-12-20 DIAGNOSIS — E119 Type 2 diabetes mellitus without complications: Secondary | ICD-10-CM

## 2017-12-20 DIAGNOSIS — I2583 Coronary atherosclerosis due to lipid rich plaque: Secondary | ICD-10-CM

## 2017-12-20 MED ORDER — METFORMIN HCL 500 MG PO TABS: 500.0000 mg | ORAL_TABLET | Freq: Two times a day (BID) | ORAL | 5 refills | Status: DC

## 2017-12-20 NOTE — Assessment & Plan Note (Signed)
Try to lose 10 pounds over next 3 months

## 2017-12-20 NOTE — Patient Instructions (Addendum)
Try to follow the DASH guidelines (DASH stands for Dietary Approaches to Stop Hypertension). Try to limit the sodium in your diet to no more than 1,500mg  of sodium per day. Certainly try to not exceed 2,000 mg per day at the very most. Do not add salt when cooking or at the table.  Check the sodium amount on labels when shopping, and choose items lower in sodium when given a choice. Avoid or limit foods that already contain a lot of sodium. Eat a diet rich in fruits and vegetables and whole grains, and try to lose weight if overweight or obese  Try to work on losing 10 pounds in the next 3 months  I do encourage you to quit smoking Call [phone] to sign up for smoking cessation classes You can call 1-800-QUIT-NOW to talk with a smoking cessation coach

## 2017-12-20 NOTE — Assessment & Plan Note (Signed)
Check lipids today; weight loss should help raise the HDL; low HDL risk of heart attack

## 2017-12-20 NOTE — Assessment & Plan Note (Signed)
Asymptomatic; taking aspirin; seeing cards on Monday

## 2017-12-20 NOTE — Progress Notes (Signed)
BP 130/70   Pulse 81   Temp 97.6 F (36.4 C) (Oral)   Resp 16   Ht 5\' 8"  (1.727 m)   Wt 191 lb (86.6 kg)   SpO2 95%   BMI 29.04 kg/m    Subjective:    Patient ID: Scott Rosario, male    DOB: [birth year], 59 y.o.   MRN: [id number]  HPI: Scott Rosario is a 57 y.o. male  Chief Complaint  Patient presents with  . Follow-up    HPI  Patient is here for f/u Type 2 DM No problems with feet; no blurred vision, no nocturia, but does have dry mouth Checking sugars once a week, still running high in the mornings, 200-218, higher in the mornings Going to see eye doctor; urine as normal [place] Lab Results  Component Value Date   HGBA1C 9.4 (H) 09/10/2017    Coronary artery disease; sees cardiologist Monday; no chest pain; aspirin; no bleeding with that  HTN; checking occasionally; 118/68 yesterday; not adding salt at all  High cholesterol; limiting sat fats Lab Results  Component Value Date   CHOL 111 08/09/2017   HDL 29 (L) 08/09/2017   LDLCALC 62 08/09/2017   TRIG 123 08/09/2017   CHOLHDL 3.8 08/09/2017   Just had EGD with GI doctor; it was good, patient says; cirrhosis, following up there; no abd pain, no jaundice  Depression screen [facility] 2/9 12/20/2017 10/19/2017 09/22/2017 09/16/2017 08/09/2017  Decreased Interest 0 0 0 0 0  Down, Depressed, Hopeless 0 0 0 0 0  PHQ - 2 Score 0 0 0 0 0    Relevant past medical, surgical, family and social history reviewed Past Medical History:  Diagnosis Date  . Alkaline phosphatase elevation   . Aortic atherosclerosis ([place])    on CT 02/25/14  . Asthma   . CAD (coronary artery disease)   . Cirrhosis ([place])   . COPD (chronic obstructive pulmonary disease) ([place])   . Diabetes mellitus without complication ([place])   . Emphysema of lung ([place])   . GERD (gastroesophageal reflux disease)   . Heart attack ([place])    2005, 2017 (stent after each)  . Hyperlipidemia   . Hypertension   . Pulmonary nodules    x 3, (22mm, 32mm, 66mm)  chest CT February 25, 2014  . Renal cyst    on CT 02/25/14  . Tobacco use    Past Surgical History:  Procedure Laterality Date  . APPENDECTOMY    . CARDIAC CATHETERIZATION Left 03/09/2016   Procedure: Left Heart Cath and Coronary Angiography;  Surgeon: Corey Skains, MD;  Location: [facility];  Service: Cardiovascular;  Laterality: Left;  . CARDIAC CATHETERIZATION N/A 03/09/2016   Procedure: Coronary Stent Intervention;  Surgeon: Isaias Cowman, MD;  Location: [facility];  Service: Cardiovascular;  Laterality: N/A;  . CORONARY ANGIOPLASTY WITH STENT PLACEMENT  10/25/2003   x 2  . ESOPHAGOGASTRODUODENOSCOPY (EGD) WITH PROPOFOL N/A 10/24/2017   Procedure: ESOPHAGOGASTRODUODENOSCOPY (EGD) WITH PROPOFOL;  Surgeon: Lucilla Lame, MD;  Location: [facility];  Service: Endoscopy;  Laterality: N/A;  Diabetic - oral meds   Family History  Problem Relation Age of Onset  . Diabetes Mother   . Hypertension Mother   . Arthritis Mother   . Hyperlipidemia Mother   . Heart attack Father   . Epilepsy Father   . Diabetes Father   . Seizures Father   . Heart disease Father   . Hyperlipidemia Father   .  Hypertension Father   . Stroke Father   . Diabetes Brother   . Hyperlipidemia Brother   . Hypertension Brother   . Cancer Maternal Grandmother        unknown  . Diabetes Paternal Grandmother   . Heart disease Paternal Grandmother   . Hyperlipidemia Paternal Grandmother   . Hypertension Paternal Grandmother   . Heart attack Paternal Grandfather    Social History   Tobacco Use  . Smoking status: Current Every Day Smoker    Packs/day: 0.50    Years: 40.00    Pack years: 20.00    Types: Cigarettes    Last attempt to quit: 09/09/2017    Years since quitting: 0.2  . Smokeless tobacco: Never Used  . Tobacco comment: (10/18/17 - currently 6-10 cigs/day)  Substance Use Topics  . Alcohol use: No    Alcohol/week: 0.0 oz  . Drug use: No    Interim medical history  since last visit reviewed. Allergies and medications reviewed  Review of Systems Per HPI unless specifically indicated above     Objective:    BP 130/70   Pulse 81   Temp 97.6 F (36.4 C) (Oral)   Resp 16   Ht 5\' 8"  (1.727 m)   Wt 191 lb (86.6 kg)   SpO2 95%   BMI 29.04 kg/m   Wt Readings from Last 3 Encounters:  12/20/17 191 lb (86.6 kg)  10/24/17 191 lb (86.6 kg)  10/19/17 191 lb 9.6 oz (86.9 kg)    Physical Exam  Constitutional: He appears well-developed and well-nourished. No distress.  HENT:  Head: Normocephalic and atraumatic.  Eyes: EOM are normal. No scleral icterus.  Neck: No thyromegaly present.  Cardiovascular: Normal rate and regular rhythm.  Pulmonary/Chest: Effort normal and breath sounds normal.  Abdominal: Soft. Bowel sounds are normal. He exhibits no distension.  Musculoskeletal: He exhibits no edema.  Neurological: Coordination normal.  Skin: Skin is warm and dry. No pallor.  Psychiatric: He has a normal mood and affect. His behavior is normal. Judgment and thought content normal.   Diabetic Foot Form - Detailed   Diabetic Foot Exam - detailed Diabetic Foot exam was performed with the following findings:  Yes 12/20/2017  8:23 AM  Visual Foot Exam completed.:  Yes  Pulse Foot Exam completed.:  Yes  Right Dorsalis Pedis:  Present Left Dorsalis Pedis:  Present  Sensory Foot Exam Completed.:  Yes Semmes-Weinstein Monofilament Test R Site 1-Great Toe:  Pos L Site 1-Great Toe:  Pos        Results for orders placed or performed during the hospital encounter of 10/24/17  Glucose, capillary  Result Value Ref Range   Glucose-Capillary 250 (H) 65 - 99 mg/dL  Glucose, capillary  Result Value Ref Range   Glucose-Capillary 241 (H) 65 - 99 mg/dL      Assessment & Plan:   Problem List Items Addressed This Visit      Cardiovascular and Mediastinum   Hypertension (Chronic)    Controlled; try DASH guidelines      CAD (coronary artery disease)  (Chronic)    Asymptomatic; taking aspirin; seeing cards on Monday        Endocrine   Diabetes mellitus without complication (HCC) (Chronic)    Higher morning sugars; 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; add metformin in the evening; foot exam by MD today; going to see eye doctor soon      Relevant Medications   metFORMIN (GLUCOPHAGE) 500 MG tablet   Other Relevant Orders  Hemoglobin A1c     Other   Overweight (BMI 25.0-29.9)    Try to lose 10 pounds over next 3 months      Medication monitoring encounter    Check kidneys and liver      Relevant Orders   COMPLETE METABOLIC PANEL WITH GFR   Hyperlipidemia (Chronic)    Check lipids today; weight loss should help raise the HDL; low HDL risk of heart attack      Relevant Orders   Lipid panel       Follow up plan: Return in about 3 months (around 03/22/2018).  An after-visit summary was printed and given to the patient at [facility].  Please see the patient instructions which may contain other information and recommendations beyond what is mentioned above in the assessment and plan.  Meds ordered this encounter  Medications  . metFORMIN (GLUCOPHAGE) 500 MG tablet    Sig: Take 1 tablet (500 mg total) by mouth 2 (two) times daily with a meal. Take 15-30 minutes once a day before morning and evening meals    Dispense:  60 tablet    Refill:  5    Orders Placed This Encounter  Procedures  . Lipid panel  . Hemoglobin A1c  . COMPLETE METABOLIC PANEL WITH GFR

## 2017-12-20 NOTE — Assessment & Plan Note (Signed)
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

## 2017-12-20 NOTE — Assessment & Plan Note (Signed)
Controlled; try DASH guidelines 

## 2017-12-20 NOTE — Assessment & Plan Note (Signed)
Check kidneys and liver 

## 2017-12-21 ENCOUNTER — Telehealth: Payer: Self-pay

## 2017-12-21 LAB — COMPLETE METABOLIC PANEL WITH GFR
AG Ratio: 2.1 (calc) (ref 1.0–2.5)
ALT: 27 U/L (ref 9–46)
AST: 18 U/L (ref 10–35)
Albumin: 4.4 g/dL (ref 3.6–5.1)
Alkaline phosphatase (APISO): 128 U/L — ABNORMAL HIGH (ref 40–115)
BUN: 10 mg/dL (ref 7–25)
CALCIUM: 9.4 mg/dL (ref 8.6–10.3)
CO2: 29 mmol/L (ref 20–32)
CREATININE: 0.76 mg/dL (ref 0.70–1.33)
Chloride: 103 mmol/L (ref 98–110)
GFR, EST AFRICAN AMERICAN: 117 mL/min/{1.73_m2} (ref >=60)
GFR, EST NON AFRICAN AMERICAN: 101 mL/min/{1.73_m2} (ref >=60)
Globulin: 2.1 g/dL (calc) (ref 1.9–3.7)
Glucose, Bld: 278 mg/dL — ABNORMAL HIGH (ref 65–99)
Potassium: 4.8 mmol/L (ref 3.5–5.3)
Sodium: 137 mmol/L (ref 135–146)
TOTAL PROTEIN: 6.5 g/dL (ref 6.1–8.1)
Total Bilirubin: 1.3 mg/dL — ABNORMAL HIGH (ref 0.2–1.2)

## 2017-12-21 LAB — HEMOGLOBIN A1C
EAG (MMOL/L): 11.2 (calc)
Hgb A1c MFr Bld: 8.7 % of total Hgb — ABNORMAL HIGH (ref <5.7)
MEAN PLASMA GLUCOSE: 203 (calc)

## 2017-12-21 LAB — LIPID PANEL
CHOL/HDL RATIO: 4.1 (calc) (ref <5.0)
CHOLESTEROL: 111 mg/dL (ref <200)
HDL: 27 mg/dL — AB (ref >40)
LDL Cholesterol (Calc): 62 mg/dL (calc)
NON-HDL CHOLESTEROL (CALC): 84 mg/dL (ref <130)
Triglycerides: 137 mg/dL (ref <150)

## 2017-12-21 MED ORDER — SITAGLIPTIN PHOSPHATE 100 MG PO TABS: 100.0000 mg | ORAL_TABLET | Freq: Every day | ORAL | 5 refills | Status: DC

## 2017-12-21 NOTE — Telephone Encounter (Signed)
Called pt informed him of the information below. Pt gave verbal understanding.  

## 2017-12-21 NOTE — Telephone Encounter (Signed)
Called pt informed him of the information below, pt gave verbal understanding. Advised pt on dietary restrictions cutting back on carbs, starches, processed foods, and cows and pigs. Pt ok to start Victoza, please send in. Thanks

## 2017-12-21 NOTE — Telephone Encounter (Signed)
-----   Message from Arnetha Courser, MD sent at 12/21/2017  1:56 PM EDT ----- Guerry Minors, please let pt know that his LDL is wonderful. His HDL is too low, though. That puts him at risk for heart attacks in the future. To improve that number, smoking cessation and weight loss and walking are the keys. If he is not already exercising, have him start low and build up very gradually. His A1c has improved, but is still too high. I'd like to suggest an injectable medicine at this point which may help prevent heart attacks while helping his blood sugar. People who should not take this medicine are people who have ever had medullary thyroid carcinoma, pancreatitis, or if there is a family hx of MEN-2 (a rare inherited cancer disorder). If okay, let's start Victoza (back to me for Rx). Thank you

## 2017-12-21 NOTE — Telephone Encounter (Signed)
I do remember now that he vomited with Ozempic Also allergic to invokana Please let him know we'll use a PILL called [place] instead

## 2018-02-06 ENCOUNTER — Other Ambulatory Visit: Payer: Self-pay | Admitting: Family Medicine

## 2018-02-06 NOTE — Telephone Encounter (Signed)
Cr and K+ reviewed Rx approved 

## 2018-02-14 ENCOUNTER — Telehealth: Payer: Self-pay | Admitting: *Deleted

## 2018-02-14 NOTE — Telephone Encounter (Signed)
Received referral for low dose lung cancer screening CT scan. Message left at phone number listed in EMR for patient to call me back to facilitate scheduling scan.  

## 2018-03-02 ENCOUNTER — Telehealth: Payer: Self-pay | Admitting: *Deleted

## 2018-03-02 NOTE — Telephone Encounter (Signed)
Received referral for low dose lung cancer screening CT scan. Message left at phone number listed in EMR for patient to call me back to facilitate scheduling scan.  

## 2018-03-03 IMAGING — CT CT CHEST W/O CM
2 of 3 series · 16 of 46 positions shown, 18 images · non-contrast
Comparison: 12/02/2014, 02/25/2014.

CLINICAL DATA: Two year interval followup of likely benign nodules
in the left lower lobe. Current smoker.

EXAM:
CT CHEST WITHOUT CONTRAST
TECHNIQUE: Multidetector CT imaging of the chest was performed following the
standard protocol without IV contrast.

[Series 2: routine chest wo · axial · 0.76mm/px · z∈[-537,-267]mm · 13 of 62 slices shown, 15 images]
[im 4/62  soft-tissue]
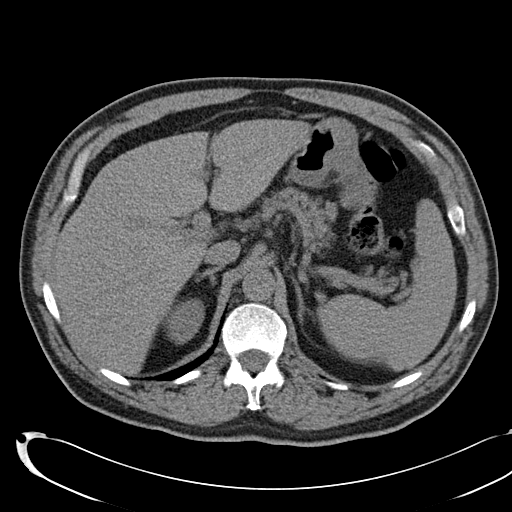
[im 4/62  bone]
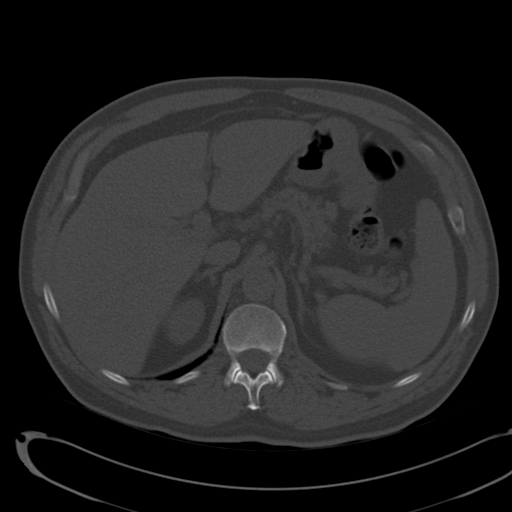
[im 8/62  soft-tissue]
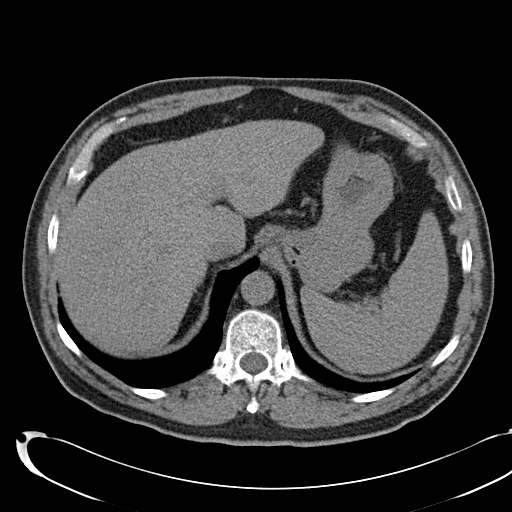
[im 12/62  soft-tissue]
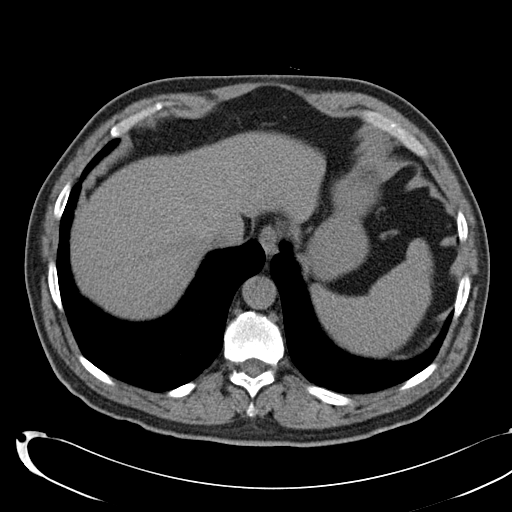
[im 18/62  soft-tissue]
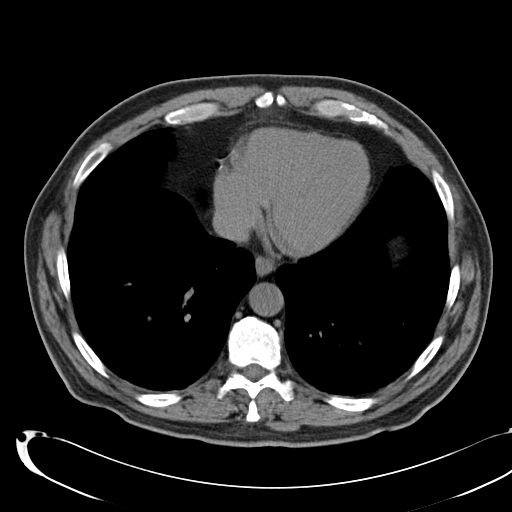
[im 22/62  soft-tissue]
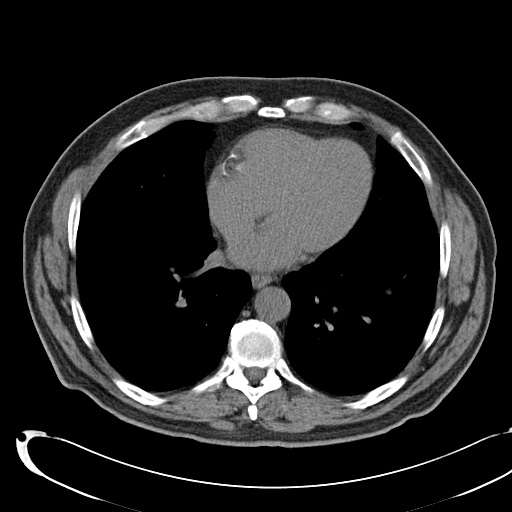
[im 26/62  soft-tissue]
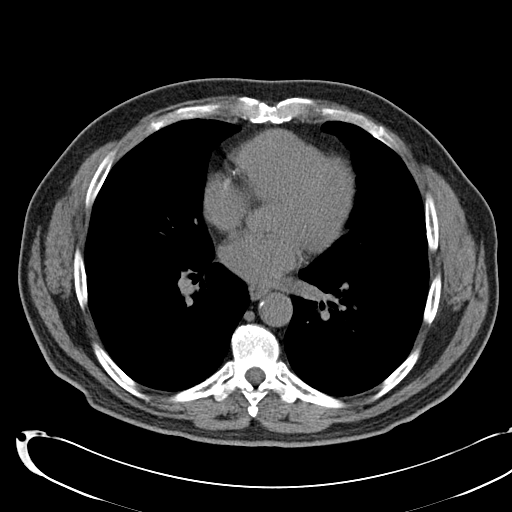
[im 32/62  soft-tissue]
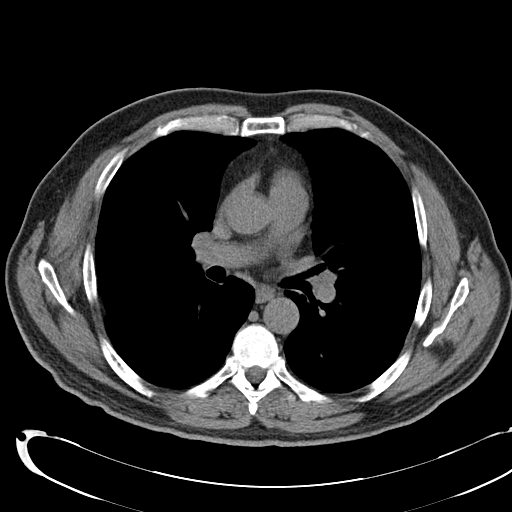
[im 36/62  soft-tissue]
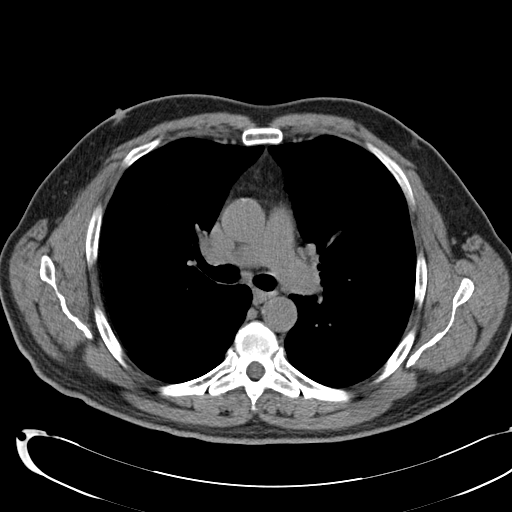
[im 40/62  soft-tissue]
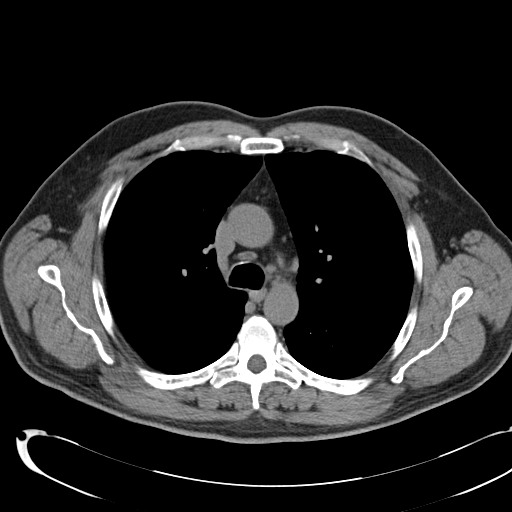
[im 40/62  bone]
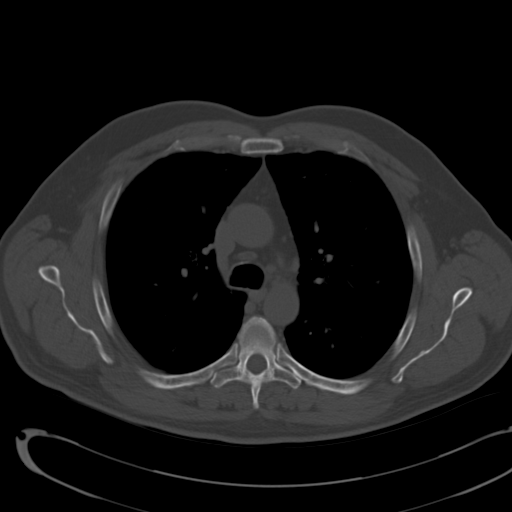
[im 44/62  soft-tissue]
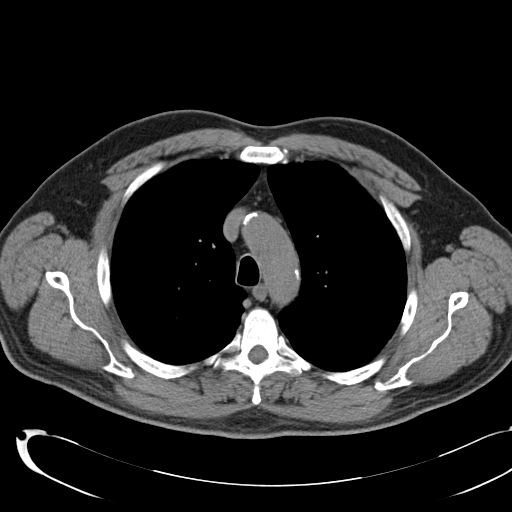
[im 50/62  soft-tissue]
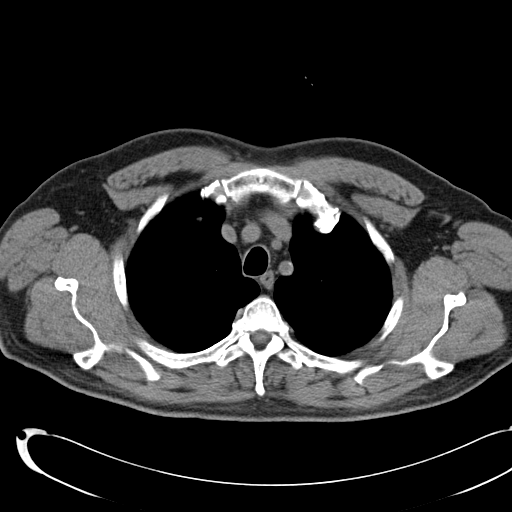
[im 54/62  soft-tissue]
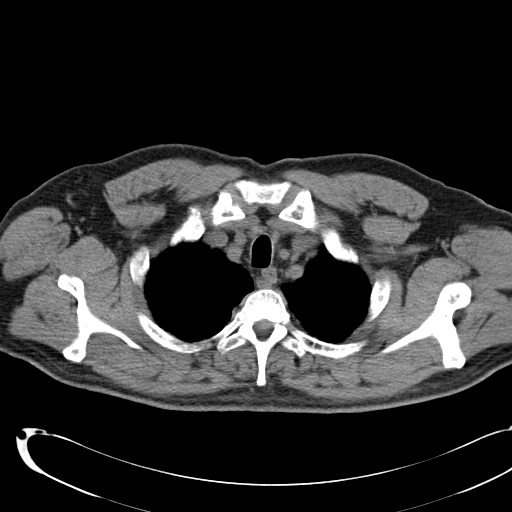
[im 58/62  soft-tissue]
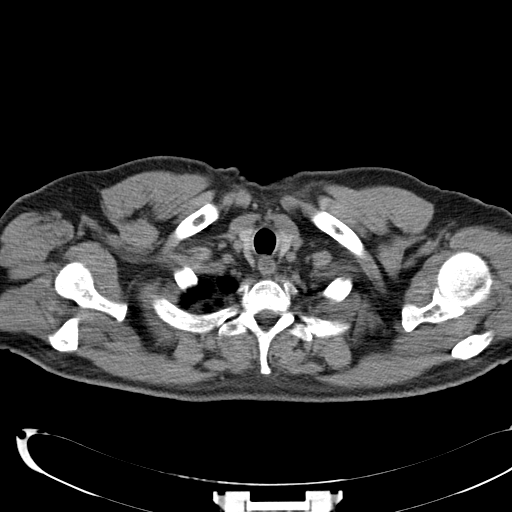

[Series 5: routine chest wo cor · coronal · 0.71mm/px · 3 of 143 slices shown]
[im 48/143  soft-tissue]
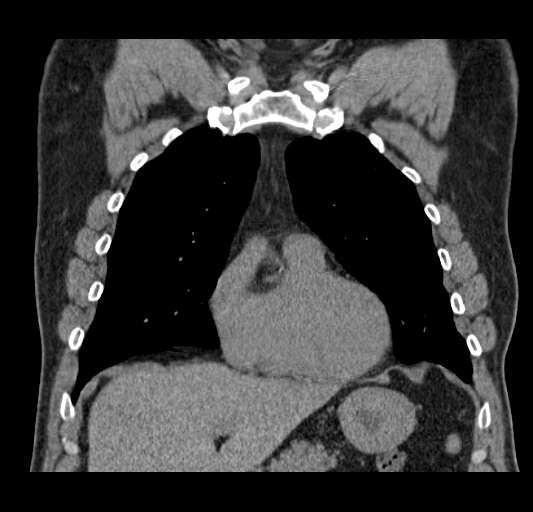
[im 64/143  soft-tissue]
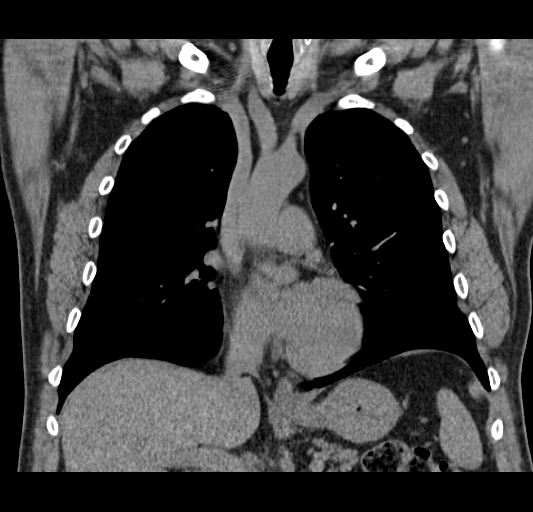
[im 79/143  soft-tissue]
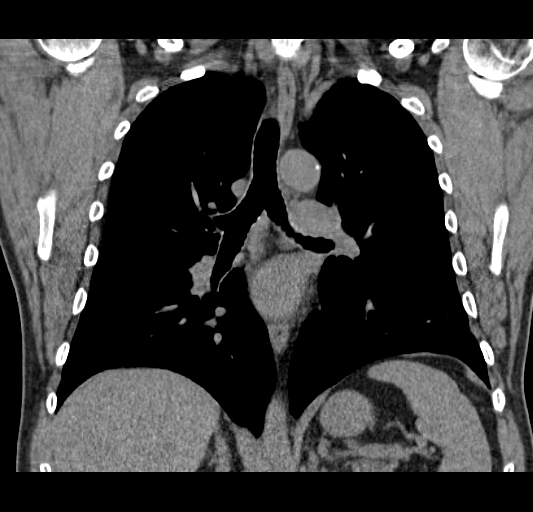

[16 of 46 positions shown; findings below may reference images not displayed]

FINDINGS: Mediastinum/Lymph Nodes: Heart size normal. Moderate-to-marked 3
vessel coronary atherosclerosis as noted previously. Mild to
moderate atherosclerosis involving the thoracic and upper abdominal
aorta without evidence of aneurysm. No pericardial effusion.

No pathologically enlarged mediastinal, hilar or axillary lymph
nodes. No mediastinal masses. Normal-appearing esophagus. Visualized
thyroid gland unremarkable.

Lungs/Pleura: In the superior segment left lower lobe there is a 6 x
4 mm nodule (5 mm mean diameter) on image 60 of series 3. This
nodule has a polygonal shaped, is subpleural in location and is
unchanged dating back to February 2014.

In the deep posterior basal segment of the left lower lobe there is
a 4 x 4 mm nodule (4 mm mean diameter) on image 114 of series 3.
This nodule is unchanged dating back to February 2014.

No new or enlarging nodules in either lung. Bullous emphysematous
changes in both lungs, with numerous apical blebs, right greater
than left. No confluent airspace consolidation. No evidence of
interstitial lung disease. No pleural effusions.

Upper abdomen: Normal unenhanced appearance.

Musculoskeletal: Mild degenerative disc disease and spondylosis
involving the mid thoracic spine. Benign bone island in the T8
vertebral body. No acute abnormality.
IMPRESSION: 1. Stable left lower lobe lung nodules dating back to February 2014. As
2 years of stability has now been documented, no further imaging
followup is felt necessary.
2. COPD/emphysema.  No acute cardiopulmonary disease.
3. Moderate to marked three-vessel coronary atherosclerosis as noted
previously.

## 2018-03-08 ENCOUNTER — Other Ambulatory Visit: Payer: Self-pay | Admitting: Family Medicine

## 2018-03-08 DIAGNOSIS — E782 Mixed hyperlipidemia: Secondary | ICD-10-CM

## 2018-03-16 ENCOUNTER — Encounter: Payer: Self-pay | Admitting: Internal Medicine

## 2018-03-22 ENCOUNTER — Ambulatory Visit: Payer: Self-pay | Admitting: Family Medicine

## 2018-04-03 ENCOUNTER — Ambulatory Visit: Payer: Self-pay | Admitting: Family Medicine

## 2018-04-03 ENCOUNTER — Other Ambulatory Visit: Payer: Self-pay | Admitting: Family Medicine

## 2018-04-03 ENCOUNTER — Telehealth: Payer: Self-pay | Admitting: Family Medicine

## 2018-04-03 DIAGNOSIS — K7469 Other cirrhosis of liver: Secondary | ICD-10-CM

## 2018-04-03 DIAGNOSIS — I700 Atherosclerosis of aorta: Secondary | ICD-10-CM

## 2018-04-03 DIAGNOSIS — Z5181 Encounter for therapeutic drug level monitoring: Secondary | ICD-10-CM

## 2018-04-03 DIAGNOSIS — E782 Mixed hyperlipidemia: Secondary | ICD-10-CM

## 2018-04-03 DIAGNOSIS — E119 Type 2 diabetes mellitus without complications: Secondary | ICD-10-CM

## 2018-04-03 NOTE — Telephone Encounter (Signed)
Trying to put in weight 184.3 pounds

## 2018-04-03 NOTE — Progress Notes (Signed)
labs

## 2018-04-04 LAB — COMPLETE METABOLIC PANEL WITH GFR
AG Ratio: 2.1 (calc) (ref 1.0–2.5)
ALT: 31 U/L (ref 9–46)
AST: 18 U/L (ref 10–35)
Albumin: 4.5 g/dL (ref 3.6–5.1)
Alkaline phosphatase (APISO): 162 U/L — ABNORMAL HIGH (ref 40–115)
BUN: 14 mg/dL (ref 7–25)
CALCIUM: 9.2 mg/dL (ref 8.6–10.3)
CO2: 24 mmol/L (ref 20–32)
CREATININE: 0.85 mg/dL (ref 0.70–1.33)
Chloride: 104 mmol/L (ref 98–110)
GFR, EST AFRICAN AMERICAN: 111 mL/min/{1.73_m2} (ref >=60)
GFR, EST NON AFRICAN AMERICAN: 96 mL/min/{1.73_m2} (ref >=60)
GLOBULIN: 2.1 g/dL (ref 1.9–3.7)
Glucose, Bld: 225 mg/dL — ABNORMAL HIGH (ref 65–99)
Potassium: 4.5 mmol/L (ref 3.5–5.3)
Sodium: 138 mmol/L (ref 135–146)
TOTAL PROTEIN: 6.6 g/dL (ref 6.1–8.1)
Total Bilirubin: 1.2 mg/dL (ref 0.2–1.2)

## 2018-04-04 LAB — CBC WITH DIFFERENTIAL/PLATELET
Basophils Absolute: 79 cells/uL (ref 0–200)
Basophils Relative: 0.7 %
Eosinophils Absolute: 170 cells/uL (ref 15–500)
Eosinophils Relative: 1.5 %
HEMATOCRIT: 42.1 % (ref 38.5–50.0)
HEMOGLOBIN: 14.9 g/dL (ref 13.2–17.1)
LYMPHS ABS: 1526 {cells}/uL (ref 850–3900)
MCH: 30.1 pg (ref 27.0–33.0)
MCHC: 35.4 g/dL (ref 32.0–36.0)
MCV: 85.1 fL (ref 80.0–100.0)
MPV: 10.6 fL (ref 7.5–12.5)
Monocytes Relative: 6.9 %
NEUTROS ABS: 8746 {cells}/uL — AB (ref 1500–7800)
NEUTROS PCT: 77.4 %
Platelets: 214 10*3/uL (ref 140–400)
RBC: 4.95 10*6/uL (ref 4.20–5.80)
RDW: 13 % (ref 11.0–15.0)
Total Lymphocyte: 13.5 %
WBC: 11.3 10*3/uL — ABNORMAL HIGH (ref 3.8–10.8)
WBCMIX: 780 {cells}/uL (ref 200–950)

## 2018-04-04 LAB — HEMOGLOBIN A1C
Hgb A1c MFr Bld: 8.7 % of total Hgb — ABNORMAL HIGH (ref <5.7)
Mean Plasma Glucose: 203 (calc)
eAG (mmol/L): 11.2 (calc)

## 2018-04-04 LAB — LIPID PANEL
CHOL/HDL RATIO: 3.6 (calc) (ref <5.0)
CHOLESTEROL: 104 mg/dL (ref <200)
HDL: 29 mg/dL — AB (ref >40)
LDL Cholesterol (Calc): 55 mg/dL (calc)
Non-HDL Cholesterol (Calc): 75 mg/dL (calc) (ref <130)
Triglycerides: 123 mg/dL (ref <150)

## 2018-04-07 ENCOUNTER — Encounter: Payer: Self-pay | Admitting: *Deleted

## 2018-04-13 ENCOUNTER — Telehealth: Payer: Self-pay | Admitting: Family Medicine

## 2018-04-13 ENCOUNTER — Ambulatory Visit (INDEPENDENT_AMBULATORY_CARE_PROVIDER_SITE_OTHER): Payer: Self-pay | Admitting: Nurse Practitioner

## 2018-04-13 ENCOUNTER — Encounter: Payer: Self-pay | Admitting: Family Medicine

## 2018-04-13 VITALS — BP 120/70 | HR 76 | Temp 98.6°F | Resp 16 | Ht 68.0 in | Wt 186.5 lb

## 2018-04-13 DIAGNOSIS — K7469 Other cirrhosis of liver: Secondary | ICD-10-CM

## 2018-04-13 DIAGNOSIS — Z720 Tobacco use: Secondary | ICD-10-CM

## 2018-04-13 DIAGNOSIS — I700 Atherosclerosis of aorta: Secondary | ICD-10-CM

## 2018-04-13 DIAGNOSIS — K219 Gastro-esophageal reflux disease without esophagitis: Secondary | ICD-10-CM

## 2018-04-13 DIAGNOSIS — I2583 Coronary atherosclerosis due to lipid rich plaque: Secondary | ICD-10-CM

## 2018-04-13 DIAGNOSIS — J439 Emphysema, unspecified: Secondary | ICD-10-CM

## 2018-04-13 DIAGNOSIS — I2510 Atherosclerotic heart disease of native coronary artery without angina pectoris: Secondary | ICD-10-CM

## 2018-04-13 DIAGNOSIS — E782 Mixed hyperlipidemia: Secondary | ICD-10-CM

## 2018-04-13 DIAGNOSIS — E119 Type 2 diabetes mellitus without complications: Secondary | ICD-10-CM

## 2018-04-13 DIAGNOSIS — I10 Essential (primary) hypertension: Secondary | ICD-10-CM

## 2018-04-13 MED ORDER — GLIPIZIDE ER 10 MG PO TB24: 10.0000 mg | ORAL_TABLET | Freq: Every day | ORAL | 2 refills | Status: DC

## 2018-04-13 MED ORDER — METOPROLOL SUCCINATE ER 25 MG PO TB24: 25.0000 mg | ORAL_TABLET | Freq: Every day | ORAL | 2 refills | Status: DC

## 2018-04-13 MED ORDER — PANTOPRAZOLE SODIUM 40 MG PO TBEC: 40.0000 mg | DELAYED_RELEASE_TABLET | Freq: Two times a day (BID) | ORAL | 2 refills | Status: DC

## 2018-04-13 MED ORDER — INSULIN GLARGINE 100 UNIT/ML SOLOSTAR PEN: 10.0000 [IU] | PEN_INJECTOR | Freq: Every day | SUBCUTANEOUS | 2 refills | Status: DC

## 2018-04-13 MED ORDER — ATORVASTATIN CALCIUM 80 MG PO TABS: 80.0000 mg | ORAL_TABLET | Freq: Every day | ORAL | 2 refills | Status: DC

## 2018-04-13 MED ORDER — METFORMIN HCL 500 MG PO TABS: 500.0000 mg | ORAL_TABLET | Freq: Two times a day (BID) | ORAL | 2 refills | Status: DC

## 2018-04-13 MED ORDER — METFORMIN HCL 500 MG PO TABS: 1000.0000 mg | ORAL_TABLET | Freq: Two times a day (BID) | ORAL | 2 refills | Status: DC

## 2018-04-13 MED ORDER — LOSARTAN POTASSIUM 50 MG PO TABS
50.0000 mg | ORAL_TABLET | Freq: Every day | ORAL | 2 refills | Status: DC
Start: 2018-04-13 — End: 2018-07-25

## 2018-04-13 NOTE — Patient Instructions (Addendum)
-   Look up [facility] for free- reduced cost primary care. [phone] - Keeping you on metformin 500mg  twice a day due to your liver  -HDL removes extra cholesterol and plaque buildup in your arteries and then sends it to your liver to get rid of and helps reduce your risk of heart disease, heart attack, and stroke. Foods that increase HDL: beans and legumes, whole grains, high-fiber fruits:prunes, apples, and pears; fatty fish- salmon, tuna, sardines; nuts, olive oil    - We are going to start you on lantus a long acting. Please start taking 10 units of lantus every night for the next week. Check your blood sugars first thing in the morning before you eat to give [place] an idea of where your Fasting blood sugars are. You will use this to decide how to adjust your medications.   We will determine your evening dose of lantus based on your fasting (pre-breakfast) blood sugar. Our morning blood sugar goal is 80-180 mg/dL for right now.   If your morning blood sugar value is:  . 60-70 mg/dl= decrease your lantus dose by one unit . < 60 mg/dl = decrease your lantus dose by two units  . 180-240 mg/dl = increase your lantus dose by two unit and do not increase your dose again for 2 days . >240mg /dl= increase your lantus dose by three units and do not increase your dose again for 2 days

## 2018-04-13 NOTE — Telephone Encounter (Signed)
Copied from [place] [id number]. Topic: Quick Communication - Rx Refill/Question >> Apr 13, 2018 11:51 AM Margot Ables wrote: Medication: metformin - pharmacy received 2 RX for metformin - 1 for 1 tab 2/day - 1 for 2 tabs 2/day - please call to clarify correct RX - ph# [phone]

## 2018-04-13 NOTE — Progress Notes (Addendum)
BP 120/70 (BP Location: Left Arm, Patient Position: Sitting, Cuff Size: Normal)   Pulse 76   Temp 98.6 F (37 C) (Oral)   Resp 16   Ht 5\' 8"  (1.727 m)   Wt 186 lb 8 oz (84.6 kg)   SpO2 98%   BMI 28.36 kg/m    Subjective:    Patient ID: Scott Rosario, male    DOB: [birth year], 59 y.o.   MRN: [id number]  HPI: Scott Rosario is a 58 y.o. male  Chief Complaint  Patient presents with  . Diabetes    He continues to take his dm medications as prescribed.  . Hypertension  . Hyperlipidemia    HPI Diabetes Mellitus Patient is rx metformin 1000mg  BID and glipizide 10mg  daily and januvia 100mg . Takes medications glipizide and metformin daly with no missed doses. Never picked up [place]- states was too expensive.  Diet: states eats less, 6:30am-2 eggs, peaches or candelope and coffee with sweet and low ; 12:30- vegetables and meat or chicken salad and sweet tea; 5-8pm- steak or [place] sub, salad, hamburger steak or chicken. Doesn't get along water; diet mountain dew- 4-5 a day.  Checks blood sugars 0-5 times a day. Fasting ranging from 230-250. Lowest was 92- Wednesday night.  Denies polyphagia, polydipsia. Endorses polyuria- states related to drinking lots of diet mountain dew.   Hypertension Patient is rx losartan 5mg  daily and metoprolol 25 mg daily.  Takes medications as prescribed with no missed doses a month.  Does not add salt Denies chest pain, headaches, blurry vision. BP Readings from Last 3 Encounters:  04/13/18 120/70  12/20/17 130/70  10/24/17 120/78    Hyperlipidemia Patient rx atorvastatin 80mg  nightly. Takes medications as prescribed with no missed doses a month.  Diet: at least 2 vegetables a day. Eats out 7 times a week. Fried foods- 3 times a week.  Denies myalgias   COPD Patient smokes at least 1ppd not ready to quit. No daily inhalers- has albuterol states never needed it.   Aortic atherosclerosis and CAD Taking daily 81 ASA and plavix 75mg -  started by Dr. Nehemiah Massed- cards. Had a MI at 57 y/o; last follow up with cards was in 09/2017.  GERD Patient states ketchup ranch trigger acid reflux; well- controlled on the medicine- states when he forgets pantoprazole for a few days symptoms return.   Cirrhosis  Patient states does not drink; never determined how this occurred. Sees Dr. Allen Norris with GI. Denies abdominal pain, nausea, vomiting.    Depression screen [facility] 2/9 04/13/2018 12/20/2017 10/19/2017 09/22/2017 09/16/2017  Decreased Interest 0 0 0 0 0  Down, Depressed, Hopeless 0 0 0 0 0  PHQ - 2 Score 0 0 0 0 0    Relevant past medical, surgical, family and social history reviewed Past Medical History:  Diagnosis Date  . Alkaline phosphatase elevation   . Aortic atherosclerosis ([place])    on CT 02/25/14  . Asthma   . CAD (coronary artery disease)   . Cirrhosis ([place])   . COPD (chronic obstructive pulmonary disease) ([place])   . Diabetes mellitus without complication ([place])   . Emphysema of lung ([place])   . GERD (gastroesophageal reflux disease)   . Heart attack ([place])    2005, 2017 (stent after each)  . Hyperlipidemia   . Hypertension   . Pulmonary nodules    x 3, (43mm, 17mm, 34mm) chest CT February 25, 2014  . Renal cyst    on CT 02/25/14  . Tobacco  use    Past Surgical History:  Procedure Laterality Date  . APPENDECTOMY    . CARDIAC CATHETERIZATION Left 03/09/2016   Procedure: Left Heart Cath and Coronary Angiography;  Surgeon: Corey Skains, MD;  Location: [facility];  Service: Cardiovascular;  Laterality: Left;  . CARDIAC CATHETERIZATION N/A 03/09/2016   Procedure: Coronary Stent Intervention;  Surgeon: Isaias Cowman, MD;  Location: [facility];  Service: Cardiovascular;  Laterality: N/A;  . CORONARY ANGIOPLASTY WITH STENT PLACEMENT  10/25/2003   x 2  . ESOPHAGOGASTRODUODENOSCOPY (EGD) WITH PROPOFOL N/A 10/24/2017   Procedure: ESOPHAGOGASTRODUODENOSCOPY (EGD) WITH PROPOFOL;  Surgeon: Lucilla Lame, MD;  Location:  [facility];  Service: Endoscopy;  Laterality: N/A;  Diabetic - oral meds   Family History  Problem Relation Age of Onset  . Diabetes Mother   . Hypertension Mother   . Arthritis Mother   . Hyperlipidemia Mother   . Heart attack Father   . Epilepsy Father   . Diabetes Father   . Seizures Father   . Heart disease Father   . Hyperlipidemia Father   . Hypertension Father   . Stroke Father   . Diabetes Brother   . Hyperlipidemia Brother   . Hypertension Brother   . Cancer Maternal Grandmother        unknown  . Diabetes Paternal Grandmother   . Heart disease Paternal Grandmother   . Hyperlipidemia Paternal Grandmother   . Hypertension Paternal Grandmother   . Heart attack Paternal Grandfather    Social History   Tobacco Use  . Smoking status: Current Every Day Smoker    Packs/day: 0.50    Years: 40.00    Pack years: 20.00    Types: Cigarettes    Last attempt to quit: 09/09/2017    Years since quitting: 0.5  . Smokeless tobacco: Never Used  . Tobacco comment: (10/18/17 - currently 6-10 cigs/day)  Substance Use Topics  . Alcohol use: No    Alcohol/week: 0.0 oz  . Drug use: No    Interim medical history since last visit reviewed. Allergies and medications reviewed  Review of Systems Per HPI unless specifically indicated above     Objective:    BP 120/70 (BP Location: Left Arm, Patient Position: Sitting, Cuff Size: Normal)   Pulse 76   Temp 98.6 F (37 C) (Oral)   Resp 16   Ht 5\' 8"  (1.727 m)   Wt 186 lb 8 oz (84.6 kg)   SpO2 98%   BMI 28.36 kg/m   Wt Readings from Last 3 Encounters:  04/13/18 186 lb 8 oz (84.6 kg)  12/20/17 191 lb (86.6 kg)  10/24/17 191 lb (86.6 kg)    Physical Exam  Constitutional: He is oriented to person, place, and time. He appears well-developed and well-nourished.  HENT:  Head: Normocephalic and atraumatic.  Neck: Normal range of motion. Neck supple. Carotid bruit is not present.  Cardiovascular: Normal heart sounds and  intact distal pulses.  Pulmonary/Chest: Effort normal and breath sounds normal.  Abdominal: Soft. Bowel sounds are normal. There is no tenderness. There is no CVA tenderness.  Neurological: He is alert and oriented to person, place, and time. He has normal strength.  Skin: Skin is warm, dry and intact.  Psychiatric: He has a normal mood and affect. His speech is normal and behavior is normal. Judgment normal.  Vitals reviewed.   Results for orders placed or performed in visit on 04/03/18  CBC with Differential/Platelet  Result Value Ref  Range   WBC 11.3 (H) 3.8 - 10.8 Thousand/uL   RBC 4.95 4.20 - 5.80 Million/uL   Hemoglobin 14.9 13.2 - 17.1 g/dL   HCT 42.1 38.5 - 50.0 %   MCV 85.1 80.0 - 100.0 fL   MCH 30.1 27.0 - 33.0 pg   MCHC 35.4 32.0 - 36.0 g/dL   RDW 13.0 11.0 - 15.0 %   Platelets 214 140 - 400 Thousand/uL   MPV 10.6 7.5 - 12.5 fL   Neutro Abs 8,746 (H) 1,500 - 7,800 cells/uL   Lymphs Abs 1,526 850 - 3,900 cells/uL   WBC mixed population 780 200 - 950 cells/uL   Eosinophils Absolute 170 15 - 500 cells/uL   Basophils Absolute 79 0 - 200 cells/uL   Neutrophils Relative % 77.4 %   Total Lymphocyte 13.5 %   Monocytes Relative 6.9 %   Eosinophils Relative 1.5 %   Basophils Relative 0.7 %  Lipid panel  Result Value Ref Range   Cholesterol 104 <200 mg/dL   HDL 29 (L) >40 mg/dL   Triglycerides 123 <150 mg/dL   LDL Cholesterol (Calc) 55 mg/dL (calc)   Total CHOL/HDL Ratio 3.6 <5.0 (calc)   Non-HDL Cholesterol (Calc) 75 <130 mg/dL (calc)  Hemoglobin A1c  Result Value Ref Range   Hgb A1c MFr Bld 8.7 (H) <5.7 % of total Hgb   Mean Plasma Glucose 203 (calc)   eAG (mmol/L) 11.2 (calc)  COMPLETE METABOLIC PANEL WITH GFR  Result Value Ref Range   Glucose, Bld 225 (H) 65 - 99 mg/dL   BUN 14 7 - 25 mg/dL   Creat 0.85 0.70 - 1.33 mg/dL   GFR, Est Non African American 96 > OR = 60 mL/min/1.29m2   GFR, Est African American 111 > OR = 60 mL/min/1.45m2   BUN/Creatinine Ratio NOT  APPLICABLE 6 - 22 (calc)   Sodium 138 135 - 146 mmol/L   Potassium 4.5 3.5 - 5.3 mmol/L   Chloride 104 98 - 110 mmol/L   CO2 24 20 - 32 mmol/L   Calcium 9.2 8.6 - 10.3 mg/dL   Total Protein 6.6 6.1 - 8.1 g/dL   Albumin 4.5 3.6 - 5.1 g/dL   Globulin 2.1 1.9 - 3.7 g/dL (calc)   AG Ratio 2.1 1.0 - 2.5 (calc)   Total Bilirubin 1.2 0.2 - 1.2 mg/dL   Alkaline phosphatase (APISO) 162 (H) 40 - 115 U/L   AST 18 10 - 35 U/L   ALT 31 9 - 46 U/L      Assessment & Plan:   Problem List Items Addressed This Visit    None       Follow up plan: 1. Mixed hyperlipidemia Discussed improving HDL - atorvastatin (LIPITOR) 80 MG tablet; Take 1 tablet (80 mg total) by mouth at bedtime.  Dispense: 30 tablet; Refill: 2  2. Essential hypertension Stable - losartan (COZAAR) 50 MG tablet; Take 1 tablet (50 mg total) by mouth daily.  Dispense: 30 tablet; Refill: 2 - metoprolol succinate (TOPROL-XL) 25 MG 24 hr tablet; Take 1 tablet (25 mg total) by mouth daily.  Dispense: 30 tablet; Refill: 2  3. Aortic atherosclerosis ([place]) - continue cards follow up; BP managed, discussed improving HDL  4. Coronary artery disease due to lipid rich plaque - continue cards follow up  5. Pulmonary emphysema, unspecified emphysema type ([place]) Smoking cessation; stable   6. Gastroesophageal reflux disease without esophagitis Discussed diet; taking antacids to work on reducing PPI dosage - pantoprazole (PROTONIX) 40  MG tablet; Take 1 tablet (40 mg total) by mouth 2 (two) times daily.  Dispense: 60 tablet; Refill: 2  7. Diabetes mellitus without complication (HCC) Discussed diet - glipiZIDE (GLUCOTROL XL) 10 MG 24 hr tablet; Take 1 tablet (10 mg total) by mouth daily.  Dispense: 30 tablet; Refill: 2 - metFORMIN (GLUCOPHAGE) 500 MG tablet; Take 1 tablet (500 mg total) by mouth 2 (two) times daily with a meal.  Dispense: 60 tablet; Refill: 2 - Insulin Glargine (LANTUS SOLOSTAR) 100 UNIT/ML Solostar Pen; Inject 10 Units  into the skin at bedtime.  Dispense: 15 mL; Refill: 2  8. Tobacco use Discussed cessation; not ready to quit; will work on cutting down.   9. Other cirrhosis of liver ([place]) - Continue GI follow-up; avoid alcohol and acetaminophen   An after-visit summary was printed and given to the patient at [place].  Please see the patient instructions which may contain other information and recommendations beyond what is mentioned above in the assessment and plan.  No orders of the defined types were placed in this encounter.   No orders of the defined types were placed in this encounter.   ----------------------------------------------------- I have reviewed this encounter including the documentation in this note and/or discussed this patient with the provider, Suezanne Cheshire DNP AGNP-C. I am certifying that I agree with the content of this note as supervising physician. Enid Derry, [facility] 04/15/2018, 6:53 PM

## 2018-04-14 NOTE — Telephone Encounter (Signed)
It looks like Scott Rosario's last prescription is for one pill (500 mg) by mouth twice a day; he should use that one We appreciate them being thorough and double checking

## 2018-04-14 NOTE — Telephone Encounter (Signed)
Pharmacy notified.

## 2018-04-17 ENCOUNTER — Telehealth: Payer: Self-pay | Admitting: Family Medicine

## 2018-04-17 ENCOUNTER — Telehealth: Payer: Self-pay

## 2018-04-17 ENCOUNTER — Other Ambulatory Visit: Payer: Self-pay

## 2018-04-17 DIAGNOSIS — R748 Abnormal levels of other serum enzymes: Secondary | ICD-10-CM

## 2018-04-17 NOTE — Telephone Encounter (Signed)
Copied from [place] [id number]. Topic: Quick Communication - See Telephone Encounter >> Apr 17, 2018  8:11 AM Conception Chancy, NT wrote: CRM for notification. See Telephone encounter for: 04/17/18.  Patient wife is calling and states that the patient as prescribed the Lantus Pen but was not prescribed the needle and the pharmacy is needing that.   [address], [place] [address] Phone: [phone] Fax: [phone]

## 2018-04-17 NOTE — Telephone Encounter (Signed)
Pt notified additional labs have been requested by Dr. Allen Norris due to recent labs done at his PCP. Pt notified to go to any [facility] drawstation as soon as he can to get these done.

## 2018-04-17 NOTE — Telephone Encounter (Signed)
-----   Message from Scott Lame, MD sent at 04/12/2018  5:49 AM EDT ----- Scott Rosario,  Thank you very much for pointing this out we will have his alkaline phosphatase fractionated to see if it is coming from the bone, liver or intestines.  We will also send off a GGT to see if there is a issue going on with his liver.  I have CC my CMA will order these labs. Thanks Scott Rosario ----- Message ----- From: Scott Courser, MD Sent: 04/10/2018   6:37 PM To: Scott Rosario, CMA, Scott Lame, MD  Scott Rosario, please see patient's alkaline phosphatase Scott Rosario, please let patient know we'll go over all of his labs at his visit; his diabetes is uncontrolled, so we'll discuss possibly starting insulin; if he has a glucose meter, check sugars 2x a day and bring with him to appointment

## 2018-04-18 NOTE — Telephone Encounter (Signed)
Called pharmacy to see what type of pen needles need to be prescribed. Pharmacist had already gave patient pen needles that he needed.

## 2018-04-20 LAB — GAMMA GT: GGT: 107 IU/L — AB (ref 0–65)

## 2018-04-21 LAB — ALKALINE PHOSPHATASE, ISOENZYMES
Alkaline Phosphatase: 171 IU/L — ABNORMAL HIGH (ref 39–117)
BONE FRACTION: 23 % (ref 12–68)
INTESTINAL FRAC.: 14 % (ref 0–18)
LIVER FRACTION: 63 % (ref 13–88)

## 2018-04-24 ENCOUNTER — Encounter: Payer: Self-pay | Admitting: Nurse Practitioner

## 2018-04-24 ENCOUNTER — Ambulatory Visit (INDEPENDENT_AMBULATORY_CARE_PROVIDER_SITE_OTHER): Payer: Self-pay | Admitting: Nurse Practitioner

## 2018-04-24 VITALS — BP 100/50 | HR 80 | Temp 98.6°F | Resp 16 | Ht 68.0 in | Wt 187.3 lb

## 2018-04-24 DIAGNOSIS — E119 Type 2 diabetes mellitus without complications: Secondary | ICD-10-CM

## 2018-04-24 DIAGNOSIS — Z720 Tobacco use: Secondary | ICD-10-CM

## 2018-04-24 DIAGNOSIS — I10 Essential (primary) hypertension: Secondary | ICD-10-CM

## 2018-04-24 NOTE — Patient Instructions (Addendum)
- Stay at 12 units of lantus a night (goal for fasting blood sugars to be between 80-130). Do not need to check your fasting blood sugars every day if stable just once a week.  -  Check your blood sugars 2 hours after a meal (Post-prandial) Our goal for this sugar is to be under 250. If it is consistently above that goal work on diet and increasing water and let us know. You only have check his 2 times a week for the next 2-3 weeks.  - Great job on cutting down your smoking- keep it up and let us know how we can help.    Steps to Quit Smoking Smoking tobacco can be bad for your health. It can also affect almost every organ in your body. Smoking puts you and people around you at risk for many serious long-lasting (chronic) diseases. Quitting smoking is hard, but it is one of the best things that you can do for your health. It is never too late to quit. What are the benefits of quitting smoking? When you quit smoking, you lower your risk for getting serious diseases and conditions. They can include:  Lung cancer or lung disease.  Heart disease.  Stroke.  Heart attack.  Not being able to have children (infertility).  Weak bones (osteoporosis) and broken bones (fractures).  If you have coughing, wheezing, and shortness of breath, those symptoms may get better when you quit. You may also get sick less often. If you are pregnant, quitting smoking can help to lower your chances of having a baby of low birth weight. What can I do to help me quit smoking? Talk with your doctor about what can help you quit smoking. Some things you can do (strategies) include:  Quitting smoking totally, instead of slowly cutting back how much you smoke over a period of time.  Going to in-person counseling. You are more likely to quit if you go to many counseling sessions.  Using resources and support systems, such as: ? Database administrator with a Social worker. ? Phone quitlines. ? Clinical research associate. ? Support groups or group counseling. ? Text messaging programs. ? Mobile phone apps or applications.  Taking medicines. Some of these medicines may have nicotine in them. If you are pregnant or breastfeeding, do not take any medicines to quit smoking unless your doctor says it is okay. Talk with your doctor about counseling or other things that can help you.  Talk with your doctor about using more than one strategy at the same time, such as taking medicines while you are also going to in-person counseling. This can help make quitting easier. What things can I do to make it easier to quit? Quitting smoking might feel very hard at first, but there is a lot that you can do to make it easier. Take these steps:  Talk to your family and friends. Ask them to support and encourage you.  Call phone quitlines, reach out to support groups, or work with a Social worker.  Ask people who smoke to not smoke around you.  Avoid places that make you want (trigger) to smoke, such as: ? Bars. ? Parties. ? Smoke-break areas at work.  Spend time with people who do not smoke.  Lower the stress in your life. Stress can make you want to smoke. Try these things to help your stress: ? Getting regular exercise. ? Deep-breathing exercises. ? Yoga. ? Meditating. ? Doing a body scan. To do this, close your eyes,  focus on one area of your body at a time from head to toe, and notice which parts of your body are tense. Try to relax the muscles in those areas.  Download or buy apps on your mobile phone or tablet that can help you stick to your quit plan. There are many free apps, such as QuitGuide from the [employer] Office manager for Disease Control and Prevention). You can find more support from smokefree.gov and other websites.  This information is not intended to replace advice given to you by your health care provider. Make sure you discuss any questions you have with your health care provider. Document Released:  07/10/2009 Document Revised: 05/11/2016 Document Reviewed: 01/28/2015 Elsevier Interactive Patient Education  2018 [employer].

## 2018-04-24 NOTE — Progress Notes (Addendum)
Name: Scott Rosario   MRN: [id number]    DOB: 30-Jun-1961   Date:04/24/2018       Progress Note  Subjective  Chief Complaint  Chief Complaint  Patient presents with  . Diabetes    10 day follow up.     HPI  Patient is here for follow-up on diabetes. Started on Insulin lantus 10 units a night with dosing adjustment instructions at last visit. Fasting blood sugars has initially been between 178-196 on 10 units of insulin, was increased to 12 units and fasting blood sugars are 110-136 units. Denies polyphagia, polydipsia, polyuria or hypoglycemia.   States has cut down smoking from 1ppd to 14 cigarettes a day, plans to go down to 10 a day this week.    Patient Active Problem List   Diagnosis Date Noted  . Overweight (BMI 25.0-29.9) 12/20/2017  . Gastritis without bleeding   . Diarrhea 10/18/2017  . Acquired trigger finger 02/15/2017  . Snoring 05/07/2016  . Medication monitoring encounter 08/19/2015  . COPD (chronic obstructive pulmonary disease) ([place])   . Emphysema of lung ([place])   . Diabetes mellitus without complication ([place])   . GERD (gastroesophageal reflux disease)   . Hyperlipidemia   . Hypertension   . Tobacco use   . Pulmonary nodules   . CAD (coronary artery disease)   . Cirrhosis ([place])   . Alkaline phosphatase elevation   . Aortic atherosclerosis ([place])   . Multiple pulmonary nodules determined by computed tomography of lung 12/31/2014  . COPD mixed type ([place]) 12/31/2014    Past Medical History:  Diagnosis Date  . Alkaline phosphatase elevation   . Aortic atherosclerosis ([place])    on CT 02/25/14  . Asthma   . CAD (coronary artery disease)   . Cirrhosis ([place])   . COPD (chronic obstructive pulmonary disease) ([place])   . Diabetes mellitus without complication ([place])   . Emphysema of lung ([place])   . GERD (gastroesophageal reflux disease)   . Heart attack ([place])    2005, 2017 (stent after each)  . Hyperlipidemia   . Hypertension   . Pulmonary nodules    x 3,  (28mm, 53mm, 69mm) chest CT February 25, 2014  . Renal cyst    on CT 02/25/14  . Tobacco use     Past Surgical History:  Procedure Laterality Date  . APPENDECTOMY    . CARDIAC CATHETERIZATION Left 03/09/2016   Procedure: Left Heart Cath and Coronary Angiography;  Surgeon: Corey Skains, MD;  Location: [facility];  Service: Cardiovascular;  Laterality: Left;  . CARDIAC CATHETERIZATION N/A 03/09/2016   Procedure: Coronary Stent Intervention;  Surgeon: Isaias Cowman, MD;  Location: [facility];  Service: Cardiovascular;  Laterality: N/A;  . CORONARY ANGIOPLASTY WITH STENT PLACEMENT  10/25/2003   x 2  . ESOPHAGOGASTRODUODENOSCOPY (EGD) WITH PROPOFOL N/A 10/24/2017   Procedure: ESOPHAGOGASTRODUODENOSCOPY (EGD) WITH PROPOFOL;  Surgeon: Lucilla Lame, MD;  Location: [facility];  Service: Endoscopy;  Laterality: N/A;  Diabetic - oral meds    Social History   Tobacco Use  . Smoking status: Current Every Day Smoker    Packs/day: 0.50    Years: 40.00    Pack years: 20.00    Types: Cigarettes    Last attempt to quit: 09/09/2017    Years since quitting: 0.6  . Smokeless tobacco: Never Used  . Tobacco comment: (10/18/17 - currently 6-10 cigs/day)  Substance Use Topics  . Alcohol use: No    Alcohol/week: 0.0  oz     Current Outpatient Medications:  .  albuterol (PROVENTIL HFA;VENTOLIN HFA) 108 (90 Base) MCG/ACT inhaler, Inhale 2 puffs into the lungs every 4 (four) hours as needed for wheezing or shortness of breath., Disp: 1 Inhaler, Rfl: 1 .  Ascorbic Acid (VITAMIN C PO), Take by mouth., Disp: , Rfl:  .  Ascorbic Acid (VITAMIN C) 100 MG tablet, Take 1 tablet by mouth daily., Disp: , Rfl:  .  aspirin EC 81 MG tablet, Take 81 mg by mouth daily., Disp: , Rfl:  .  atorvastatin (LIPITOR) 80 MG tablet, Take 1 tablet (80 mg total) by mouth at bedtime., Disp: 30 tablet, Rfl: 2 .  clopidogrel (PLAVIX) 75 MG tablet, Take 75 mg by mouth daily. , Disp: , Rfl:  .  glipiZIDE  (GLUCOTROL XL) 10 MG 24 hr tablet, Take 1 tablet (10 mg total) by mouth daily., Disp: 30 tablet, Rfl: 2 .  ibuprofen (ADVIL,MOTRIN) 200 MG tablet, Take 200 mg by mouth every 6 (six) hours as needed., Disp: , Rfl:  .  Insulin Glargine (LANTUS SOLOSTAR) 100 UNIT/ML Solostar Pen, Inject 10 Units into the skin at bedtime., Disp: 15 mL, Rfl: 2 .  losartan (COZAAR) 50 MG tablet, Take 1 tablet (50 mg total) by mouth daily., Disp: 30 tablet, Rfl: 2 .  metFORMIN (GLUCOPHAGE) 500 MG tablet, Take 1 tablet (500 mg total) by mouth 2 (two) times daily with a meal., Disp: 60 tablet, Rfl: 2 .  metoprolol succinate (TOPROL-XL) 25 MG 24 hr tablet, Take 1 tablet (25 mg total) by mouth daily., Disp: 30 tablet, Rfl: 2 .  nitroGLYCERIN (NITROSTAT) 0.4 MG SL tablet, Place 1 tablet (0.4 mg total) under the tongue every 5 (five) minutes as needed for chest pain. Max of 3 total; call 911, Disp: 25 tablet, Rfl: 5 .  pantoprazole (PROTONIX) 40 MG tablet, Take 1 tablet (40 mg total) by mouth 2 (two) times daily., Disp: 60 tablet, Rfl: 2  Allergies  Allergen Reactions  . Canagliflozin Other (See Comments)    adverse reactions: Fatigue and Zombie feeling  adverse reactions: Fatigue and Zombie feeling   . Semaglutide Diarrhea and Nausea And Vomiting    Ozempic  Acid Reflux    Review of Systems  Constitutional: Negative for chills, fever, malaise/fatigue and weight loss.  Eyes: Negative for blurred vision and double vision.  Respiratory: Negative for cough and shortness of breath.   Cardiovascular: Negative for chest pain.  Gastrointestinal: Positive for heartburn (states mild has resolved). Negative for abdominal pain, constipation, diarrhea and nausea.  Genitourinary: Negative for dysuria and urgency.  Musculoskeletal: Negative for myalgias.  Neurological: Negative for dizziness, weakness and headaches.    No other specific complaints in a complete review of systems (except as listed in HPI  above).  Objective  Vitals:   04/24/18 0838 04/24/18 0926  BP: (!) 110/50 (!) 100/50  Pulse: 80   Resp: 16   Temp: 98.6 F (37 C)   TempSrc: Oral   SpO2: 98%   Weight: 187 lb 4.8 oz (85 kg)   Height: 5\' 8"  (1.727 m)      Body mass index is 28.48 kg/m.  Nursing Note and Vital Signs reviewed.  Physical Exam  Constitutional: He is oriented to person, place, and time. He appears well-developed and well-nourished.  HENT:  Head: Normocephalic.  Neck: Normal range of motion.  Cardiovascular: Normal rate, regular rhythm and normal heart sounds.  Pulmonary/Chest: Effort normal and breath sounds normal.  Abdominal: Soft.  Neurological: He is alert and oriented to person, place, and time.  Skin: Skin is warm and dry.  Psychiatric: He has a normal mood and affect. His behavior is normal. Judgment and thought content normal.   No results found for this or any previous visit (from the past 48 hour(s)).  Assessment & Plan  1. Diabetes mellitus without complication ([place]) Discussed diet and symptoms of hypoglycemia- continue on 12 units of lantus daily; will check some post-prandials to see if further medication adjustments are needed. Follow up in 3 months  2. Essential hypertension Taking meds as prescribed, no missed doses, mildly hypotensive today- asymptomatic, no signs of infection. Has monitor at home will continue to monitor and inform us if not at goal- will adjust meds as needed.   3. Tobacco use Continue to cut down- appears motivated to quit.   -Red flags and when to present for emergency care or RTC including fever >101.34F, chest pain, shortness of breath, new/worsening/un-resolving symptoms,  reviewed with patient at time of visit. Follow up and care instructions discussed and provided in AVS. -Reviewed Health Maintenance: eye doctor appt scheduled this week.   ----------------------------------------------- I have reviewed this encounter including the documentation  in this note and/or discussed this patient with the provider, Suezanne Cheshire DNP AGNP-C. I am certifying that I agree with the content of this note as supervising physician. Enid Derry, [facility] 04/25/2018, 5:01 PM

## 2018-04-25 ENCOUNTER — Encounter: Payer: Self-pay | Admitting: Gastroenterology

## 2018-04-25 ENCOUNTER — Other Ambulatory Visit: Payer: Self-pay

## 2018-04-25 ENCOUNTER — Telehealth: Payer: Self-pay

## 2018-04-25 ENCOUNTER — Ambulatory Visit (INDEPENDENT_AMBULATORY_CARE_PROVIDER_SITE_OTHER): Payer: Self-pay | Admitting: Gastroenterology

## 2018-04-25 VITALS — BP 120/67 | HR 80 | Ht 68.0 in | Wt 190.2 lb

## 2018-04-25 DIAGNOSIS — R748 Abnormal levels of other serum enzymes: Secondary | ICD-10-CM

## 2018-04-25 NOTE — Progress Notes (Signed)
Primary Care Physician: Arnetha Courser, MD  Primary Gastroenterologist:  Dr. Lucilla Lame  Chief Complaint  Patient presents with  . Elevated Hepatic Enzymes    HPI: Scott Rosario is a 57 y.o. male here for follow-up of his test results.  The patient was found to have elevated  Alkaline phosphatase. The patient reports that this level has been elevated for many years.  He did have a GGT that was also sent off which was elevated.  The patient had fractionation of his alkaline phosphatase showed that it was predominantly for liver cirrhosis.  His lab work looking for a cause for his abnormal alkaline phosphatase did not come up with anything to explain the abnormal lab.  Current Outpatient Medications  Medication Sig Dispense Refill  . albuterol (PROVENTIL HFA;VENTOLIN HFA) 108 (90 Base) MCG/ACT inhaler Inhale 2 puffs into the lungs every 4 (four) hours as needed for wheezing or shortness of breath. 1 Inhaler 1  . Ascorbic Acid (VITAMIN C PO) Take by mouth.    . Ascorbic Acid (VITAMIN C) 100 MG tablet Take 1 tablet by mouth daily.    Marland Kitchen aspirin EC 81 MG tablet Take 81 mg by mouth daily.    Marland Kitchen atorvastatin (LIPITOR) 80 MG tablet Take 1 tablet (80 mg total) by mouth at bedtime. 30 tablet 2  . clopidogrel (PLAVIX) 75 MG tablet Take 75 mg by mouth daily.     Marland Kitchen glipiZIDE (GLUCOTROL XL) 10 MG 24 hr tablet Take 1 tablet (10 mg total) by mouth daily. 30 tablet 2  . ibuprofen (ADVIL,MOTRIN) 200 MG tablet Take 200 mg by mouth every 6 (six) hours as needed.    . Insulin Glargine (LANTUS SOLOSTAR) 100 UNIT/ML Solostar Pen Inject 10 Units into the skin at bedtime. 15 mL 2  . losartan (COZAAR) 50 MG tablet Take 1 tablet (50 mg total) by mouth daily. 30 tablet 2  . metFORMIN (GLUCOPHAGE) 500 MG tablet Take 1 tablet (500 mg total) by mouth 2 (two) times daily with a meal. 60 tablet 2  . metoprolol succinate (TOPROL-XL) 25 MG 24 hr tablet Take 1 tablet (25 mg total) by mouth daily. 30 tablet 2  .  pantoprazole (PROTONIX) 40 MG tablet Take 1 tablet (40 mg total) by mouth 2 (two) times daily. 60 tablet 2  . nitroGLYCERIN (NITROSTAT) 0.4 MG SL tablet Place 1 tablet (0.4 mg total) under the tongue every 5 (five) minutes as needed for chest pain. Max of 3 total; call 911 (Patient not taking: Reported on 04/25/2018) 25 tablet 5   No current facility-administered medications for this visit.     Allergies as of 04/25/2018 - Review Complete 04/25/2018  Allergen Reaction Noted  . Canagliflozin Other (See Comments) 01/25/2017  . Semaglutide Diarrhea and Nausea And Vomiting 10/19/2017    ROS:  General: Negative for anorexia, weight loss, fever, chills, fatigue, weakness. ENT: Negative for hoarseness, difficulty swallowing , nasal congestion. CV: Negative for chest pain, angina, palpitations, dyspnea on exertion, peripheral edema.  Respiratory: Negative for dyspnea at rest, dyspnea on exertion, cough, sputum, wheezing.  GI: See history of present illness. GU:  Negative for dysuria, hematuria, urinary incontinence, urinary frequency, nocturnal urination.  Endo: Negative for unusual weight change.    Physical Examination:   BP 120/67   Pulse 80   Ht 5\' 8"  (1.727 m)   Wt 190 lb 3.2 oz (86.3 kg)   BMI 28.92 kg/m   General: Well-nourished, well-developed in no acute distress.  Eyes:  No icterus. Conjunctivae pink. Mouth: Oropharyngeal mucosa moist and pink , no lesions erythema or exudate. Lungs: Clear to auscultation bilaterally. Non-labored. Heart: Regular rate and rhythm, no murmurs rubs or gallops.  Abdomen: Bowel sounds are normal, nontender, nondistended, no hepatosplenomegaly or masses, no abdominal bruits or hernia , no rebound or guarding.   Extremities: No lower extremity edema. No clubbing or deformities. Neuro: Alert and oriented x 3.  Grossly intact. Skin: Warm and dry, no jaundice.   Psych: Alert and cooperative, normal mood and affect.  Labs:    Imaging Studies: No  results found.  Assessment and Plan:   Scott Rosario is a 57 y.o. y/o male With a chronically elevated alkaline phosphatase any fibrosis scan showing a fibrosis score of 2-3.  The patient will be set up for a liver biopsy due to his chronically elevated alkaline phosphatase is with no obvious cause found.  The patient has been explained the plan and agrees with it.    Lucilla Lame, MD. Mar[facility] Regal   Note: This dictation was prepared with Dragon dictation along with smaller phrase technology. Any transcriptional errors that result from this process are unintentional.

## 2018-04-25 NOTE — Telephone Encounter (Signed)
-----   Message from Lucilla Lame, MD sent at 04/21/2018  9:37 PM EDT ----- Please have the patient come in for a follow up.

## 2018-04-25 NOTE — Telephone Encounter (Signed)
Pt scheduled for a follow up appt today with Dr. Allen Norris.

## 2018-04-26 ENCOUNTER — Other Ambulatory Visit: Payer: Self-pay

## 2018-04-26 DIAGNOSIS — R748 Abnormal levels of other serum enzymes: Secondary | ICD-10-CM

## 2018-04-27 ENCOUNTER — Encounter: Payer: Self-pay | Admitting: Internal Medicine

## 2018-05-02 ENCOUNTER — Telehealth: Payer: Self-pay

## 2018-05-02 NOTE — Telephone Encounter (Signed)
Left vm for pt to return my call regarding liver biopsy appt.  Liver biopsy scheduled for Thursday, August 15th at 10:00am at [facility]. Pt will need to [facility] at the medical mall registration desk at 9:00am. Pt will need to stop Plavix on August 10th.

## 2018-05-05 NOTE — Telephone Encounter (Signed)
Called pt back and informed him of his liver biopsy appt. He was also notified that today would be his last dose of Plavix.

## 2018-05-10 ENCOUNTER — Other Ambulatory Visit: Payer: Self-pay | Admitting: Radiology

## 2018-05-11 ENCOUNTER — Ambulatory Visit
Admission: RE | Admit: 2018-05-11 | Discharge: 2018-05-11 | Disposition: A | Discharge status: Home / Self Care | Payer: BLUE CROSS/BLUE SHIELD | Source: Ambulatory Visit | Attending: Gastroenterology | Admitting: Gastroenterology

## 2018-05-11 DIAGNOSIS — Z820 Family history of epilepsy and other diseases of the nervous system: Secondary | ICD-10-CM | POA: Insufficient documentation

## 2018-05-11 DIAGNOSIS — J439 Emphysema, unspecified: Secondary | ICD-10-CM | POA: Insufficient documentation

## 2018-05-11 DIAGNOSIS — R748 Abnormal levels of other serum enzymes: Secondary | ICD-10-CM

## 2018-05-11 DIAGNOSIS — Z823 Family history of stroke: Secondary | ICD-10-CM | POA: Diagnosis not present

## 2018-05-11 DIAGNOSIS — F17210 Nicotine dependence, cigarettes, uncomplicated: Secondary | ICD-10-CM | POA: Insufficient documentation

## 2018-05-11 DIAGNOSIS — I10 Essential (primary) hypertension: Secondary | ICD-10-CM | POA: Insufficient documentation

## 2018-05-11 DIAGNOSIS — I2510 Atherosclerotic heart disease of native coronary artery without angina pectoris: Secondary | ICD-10-CM | POA: Diagnosis not present

## 2018-05-11 DIAGNOSIS — E119 Type 2 diabetes mellitus without complications: Secondary | ICD-10-CM | POA: Diagnosis not present

## 2018-05-11 DIAGNOSIS — Z8249 Family history of ischemic heart disease and other diseases of the circulatory system: Secondary | ICD-10-CM | POA: Diagnosis not present

## 2018-05-11 DIAGNOSIS — R197 Diarrhea, unspecified: Secondary | ICD-10-CM | POA: Diagnosis not present

## 2018-05-11 DIAGNOSIS — Z7982 Long term (current) use of aspirin: Secondary | ICD-10-CM | POA: Insufficient documentation

## 2018-05-11 DIAGNOSIS — Z794 Long term (current) use of insulin: Secondary | ICD-10-CM | POA: Insufficient documentation

## 2018-05-11 DIAGNOSIS — Z79899 Other long term (current) drug therapy: Secondary | ICD-10-CM | POA: Diagnosis not present

## 2018-05-11 DIAGNOSIS — E785 Hyperlipidemia, unspecified: Secondary | ICD-10-CM | POA: Insufficient documentation

## 2018-05-11 DIAGNOSIS — Z8261 Family history of arthritis: Secondary | ICD-10-CM | POA: Diagnosis not present

## 2018-05-11 DIAGNOSIS — Z888 Allergy status to other drugs, medicaments and biological substances status: Secondary | ICD-10-CM | POA: Insufficient documentation

## 2018-05-11 DIAGNOSIS — Z9049 Acquired absence of other specified parts of digestive tract: Secondary | ICD-10-CM | POA: Insufficient documentation

## 2018-05-11 DIAGNOSIS — Z7902 Long term (current) use of antithrombotics/antiplatelets: Secondary | ICD-10-CM | POA: Diagnosis not present

## 2018-05-11 DIAGNOSIS — Z955 Presence of coronary angioplasty implant and graft: Secondary | ICD-10-CM | POA: Diagnosis not present

## 2018-05-11 DIAGNOSIS — Z833 Family history of diabetes mellitus: Secondary | ICD-10-CM | POA: Insufficient documentation

## 2018-05-11 LAB — CBC
HEMATOCRIT: 39.6 % — AB (ref 40.0–52.0)
Hemoglobin: 13.9 g/dL (ref 13.0–18.0)
MCH: 30.3 pg (ref 26.0–34.0)
MCHC: 35.1 g/dL (ref 32.0–36.0)
MCV: 86.4 fL (ref 80.0–100.0)
Platelets: 194 10*3/uL (ref 150–440)
RBC: 4.59 MIL/uL (ref 4.40–5.90)
RDW: 13.5 % (ref 11.5–14.5)
WBC: 10.2 10*3/uL (ref 3.8–10.6)

## 2018-05-11 LAB — PROTIME-INR
INR: 0.9
Prothrombin Time: 12.1 seconds (ref 11.4–15.2)

## 2018-05-11 LAB — GLUCOSE, CAPILLARY: Glucose-Capillary: 218 mg/dL — ABNORMAL HIGH (ref 70–99)

## 2018-05-11 MED ORDER — FENTANYL CITRATE (PF) 100 MCG/2ML IJ SOLN
INTRAMUSCULAR | Status: AC
Start: 2018-05-11 — End: 2018-05-11
  Filled 2018-05-11: qty 4

## 2018-05-11 MED ORDER — OXYCODONE HCL 5 MG PO TABS: 5.0000 mg | ORAL_TABLET | ORAL | Status: DC | PRN

## 2018-05-11 MED ORDER — FENTANYL CITRATE (PF) 100 MCG/2ML IJ SOLN
INTRAMUSCULAR | Status: AC | PRN
  Administered 2018-05-11: 50 ug via INTRAVENOUS

## 2018-05-11 MED ORDER — MIDAZOLAM HCL 5 MG/5ML IJ SOLN
INTRAMUSCULAR | Status: AC | PRN
  Administered 2018-05-11: 1 mg via INTRAVENOUS

## 2018-05-11 MED ORDER — SODIUM CHLORIDE 0.9 % IV SOLN
INTRAVENOUS | Status: DC
  Administered 2018-05-11: 20 mL/h via INTRAVENOUS

## 2018-05-11 MED ORDER — MIDAZOLAM HCL 5 MG/5ML IJ SOLN
INTRAMUSCULAR | Status: AC
  Filled 2018-05-11: qty 5

## 2018-05-11 NOTE — H&P (Signed)
Chief Complaint: Patient was seen in consultation today for  at the request of [place]  Referring Physician(s): [place]  Patient Status: [facility] - Out-pt  History of Present Illness: Scott Rosario is a 57 y.o. male with chronically elevated alkaline phosphatase level and previous hepatic elastography had scores of F2 and F3.  Patient presents for ultrasound-guided random liver biopsy.  Patient has a 40 year history of smoking.  He is still smoking.  Patient has no complaints today.  Recent episode of diarrhea 3 days ago that was self-limiting.  Past Medical History:  Diagnosis Date  . Alkaline phosphatase elevation   . Aortic atherosclerosis ([place])    on CT 02/25/14  . Asthma   . CAD (coronary artery disease)   . Cirrhosis ([place])   . COPD (chronic obstructive pulmonary disease) ([place])   . Diabetes mellitus without complication ([place])   . Emphysema of lung ([place])   . GERD (gastroesophageal reflux disease)   . Heart attack ([place])    2005, 2017 (stent after each)  . Hyperlipidemia   . Hypertension   . Pulmonary nodules    x 3, (52mm, 72mm, 49mm) chest CT February 25, 2014  . Renal cyst    on CT 02/25/14  . Tobacco use     Past Surgical History:  Procedure Laterality Date  . APPENDECTOMY    . CARDIAC CATHETERIZATION Left 03/09/2016   Procedure: Left Heart Cath and Coronary Angiography;  Surgeon: Corey Skains, MD;  Location: [facility];  Service: Cardiovascular;  Laterality: Left;  . CARDIAC CATHETERIZATION N/A 03/09/2016   Procedure: Coronary Stent Intervention;  Surgeon: Isaias Cowman, MD;  Location: [facility];  Service: Cardiovascular;  Laterality: N/A;  . CORONARY ANGIOPLASTY WITH STENT PLACEMENT  10/25/2003   x 2  . ESOPHAGOGASTRODUODENOSCOPY (EGD) WITH PROPOFOL N/A 10/24/2017   Procedure: ESOPHAGOGASTRODUODENOSCOPY (EGD) WITH PROPOFOL;  Surgeon: Lucilla Lame, MD;  Location: [facility];  Service: Endoscopy;  Laterality: N/A;  Diabetic -  oral meds    Allergies: Canagliflozin and Semaglutide  Medications: Prior to Admission medications   Medication Sig Start Date End Date Taking? Authorizing Provider  atorvastatin (LIPITOR) 80 MG tablet Take 1 tablet (80 mg total) by mouth at bedtime. 04/13/18  Yes Poulose, Bethel Born, NP  glipiZIDE (GLUCOTROL XL) 10 MG 24 hr tablet Take 1 tablet (10 mg total) by mouth daily. 04/13/18  Yes Poulose, Bethel Born, NP  ibuprofen (ADVIL,MOTRIN) 200 MG tablet Take 200 mg by mouth every 6 (six) hours as needed.   Yes [provider]  Insulin Glargine (LANTUS SOLOSTAR) 100 UNIT/ML Solostar Pen Inject 10 Units into the skin at bedtime. 04/13/18  Yes Poulose, Bethel Born, NP  losartan (COZAAR) 50 MG tablet Take 1 tablet (50 mg total) by mouth daily. 04/13/18  Yes Poulose, Bethel Born, NP  metFORMIN (GLUCOPHAGE) 500 MG tablet Take 1 tablet (500 mg total) by mouth 2 (two) times daily with a meal. 04/13/18  Yes Poulose, Bethel Born, NP  metoprolol succinate (TOPROL-XL) 25 MG 24 hr tablet Take 1 tablet (25 mg total) by mouth daily. 04/13/18  Yes Poulose, Bethel Born, NP  pantoprazole (PROTONIX) 40 MG tablet Take 1 tablet (40 mg total) by mouth 2 (two) times daily. 04/13/18  Yes Poulose, Bethel Born, NP  albuterol (PROVENTIL HFA;VENTOLIN HFA) 108 (90 Base) MCG/ACT inhaler Inhale 2 puffs into the lungs every 4 (four) hours as needed for wheezing or shortness of breath. 10/19/17   [name], Satira Anis, MD  Ascorbic Acid (VITAMIN C PO) Take by mouth.    [provider]  Ascorbic Acid (VITAMIN C) 100 MG tablet Take 1 tablet by mouth daily.    [provider]  aspirin EC 81 MG tablet Take 81 mg by mouth daily.    [provider]  clopidogrel (PLAVIX) 75 MG tablet Take 75 mg by mouth daily.     [provider]  nitroGLYCERIN (NITROSTAT) 0.4 MG SL tablet Place 1 tablet (0.4 mg total) under the tongue every 5 (five) minutes as needed for chest pain. Max of 3 total; call 911 Patient  not taking: Reported on 04/25/2018 09/22/17   Arnetha Courser, MD     Family History  Problem Relation Age of Onset  . Diabetes Mother   . Hypertension Mother   . Arthritis Mother   . Hyperlipidemia Mother   . Heart attack Father   . Epilepsy Father   . Diabetes Father   . Seizures Father   . Heart disease Father   . Hyperlipidemia Father   . Hypertension Father   . Stroke Father   . Diabetes Brother   . Hyperlipidemia Brother   . Hypertension Brother   . Cancer Maternal Grandmother        unknown  . Diabetes Paternal Grandmother   . Heart disease Paternal Grandmother   . Hyperlipidemia Paternal Grandmother   . Hypertension Paternal Grandmother   . Heart attack Paternal Grandfather     Social History   Socioeconomic History  . Marital status: Married    Spouse name: Not on file  . Number of children: Not on file  . Years of education: Not on file  . Highest education level: Not on file  Occupational History  . Not on file  Social Needs  . Financial resource strain: Not on file  . Food insecurity:    Worry: Not on file    Inability: Not on file  . Transportation needs:    Medical: Not on file    Non-medical: Not on file  Tobacco Use  . Smoking status: Current Every Day Smoker    Packs/day: 0.50    Years: 40.00    Pack years: 20.00    Types: Cigarettes    Last attempt to quit: 09/09/2017    Years since quitting: 0.6  . Smokeless tobacco: Never Used  . Tobacco comment: (10/18/17 - currently 6-10 cigs/day)  Substance and Sexual Activity  . Alcohol use: No    Alcohol/week: 0.0 standard drinks  . Drug use: No  . Sexual activity: Yes  Lifestyle  . Physical activity:    Days per week: Not on file    Minutes per session: Not on file  . Stress: Not on file  Relationships  . Social connections:    Talks on phone: Not on file    Gets together: Not on file    Attends religious service: Not on file    Active member of club or organization: Not on file     Attends meetings of clubs or organizations: Not on file    Relationship status: Not on file  Other Topics Concern  . Not on file  Social History Narrative   Active and independent at baseline    ECOG Status: 0 - Asymptomatic  Review of Systems: A 12 point ROS discussed and pertinent positives are indicated in the HPI above.  All other systems are negative.  Review of Systems  Constitutional: Negative.   Gastrointestinal: Positive for diarrhea.  Vital Signs: BP 134/77   Pulse 70   Resp 18   SpO2 98%   Physical Exam  Constitutional: No distress.  HENT:  Mouth/Throat: Oropharynx is clear and moist.  Cardiovascular: Normal rate, regular rhythm and normal heart sounds.  Pulmonary/Chest: Effort normal.  Slightly faint breath sounds.  No wheezes.  Abdominal: Soft. Bowel sounds are normal.  Vitals reviewed.   Imaging: No results found.  Labs:  CBC: Recent Labs    09/13/17 0426 09/22/17 0951 04/03/18 0901 05/11/18 0910  WBC 11.7* 11.7* 11.3* 10.2  HGB 11.3* 14.1 14.9 13.9  HCT 33.3* 41.9 42.1 39.6*  PLT 166 393 214 194    COAGS: Recent Labs    05/11/18 0910  INR 0.90    BMP: Recent Labs    09/10/17 1107 09/11/17 0351 09/22/17 0951 12/20/17 0849 04/03/18 0901  NA 136 134* 133* 137 138  K 4.1 4.1 5.1 4.8 4.5  CL 102 103 99 103 104  CO2 24 23 27 29 24   GLUCOSE 216* 155* 286* 278* 225*  BUN 17 30* 18 10 14   CALCIUM 8.9 7.6* 9.3 9.4 9.2  CREATININE 0.94 1.26* 0.87 0.76 0.85  GFRNONAA >60 >60  --  101 96  GFRAA >60 >60  --  117 111    LIVER FUNCTION TESTS: Recent Labs    09/10/17 1107 10/13/17 1601 12/20/17 0849 04/03/18 0901 04/19/18 1355  BILITOT 3.1* 0.9 1.3* 1.2  --   AST 30 18 18 18   --   ALT 30 30 27 31   --   ALKPHOS 167* 154*  --   --  171*  PROT 7.1 6.9 6.5 6.6  --   ALBUMIN 3.8 4.6  --   --   --     TUMOR MARKERS: No results for input(s): AFPTM, CEA, CA199, CHROMGRNA in the last 8760 hours.  Assessment and  Plan:  57 year old with abnormal liver enzymes and concern for cirrhosis.  Patient presents for ultrasound-guided random liver biopsy.  The risks of the procedure including bleeding and infection were discussed.  Informed consent was obtained for ultrasound-guided random liver biopsy with moderate sedation.  Thank you for this interesting consult.  I greatly enjoyed meeting Jazion Atteberry and look forward to participating in their care.  A copy of this report was sent to the requesting provider on this date.  Electronically Signed: Burman Riis, MD 05/11/2018, 9:43 AM   I spent a total of  15 Minutes   in face to face in clinical consultation, greater than 50% of which was counseling/coordinating care for an ultrasound-guided liver biopsy.

## 2018-05-11 NOTE — Procedures (Signed)
  Pre-operative Diagnosis: Abnormal liver enzymes      Post-operative Diagnosis: Abnormal liver enzymes, gallstone   Indications: Abnormal liver enzymes  Procedure: US guided random liver biopsy  Findings: 3 cores from right hepatic lobe.  Gelfoam slurry injected along needle tract  Complications: None     EBL: Minimal  Plan: 3 hour bedrest

## 2018-05-11 NOTE — Discharge Instructions (Signed)
Liver Biopsy, Care After  Refer to this sheet in the next few weeks. These instructions provide you with information on caring for yourself after your procedure. Your health care provider may also give you more specific instructions. Your treatment has been planned according to current medical practices, but problems sometimes occur. Call your health care provider if you have any problems or questions after your procedure.  What can I expect after the procedure?  After your procedure, it is typical to have the following:  · A small amount of discomfort in the area where the biopsy was done and in the right shoulder or shoulder blade.  · A small amount of bruising around the area where the biopsy was done and on the skin over the liver.  · Sleepiness and fatigue for the rest of the day.    Follow these instructions at home:  · Rest at home for 1-2 days or as directed by your health care provider.  · Have a friend or family member stay with you for at least 24 hours.  · Because of the medicines used during the procedure, you should not do the following things in the first 24 hours:  ? Drive.  ? Use machinery.  ? Be responsible for the care of other people.  ? Sign legal documents.  ? Take a bath or shower.  · There are many different ways to close and cover an incision, including stitches, skin glue, and adhesive strips. Follow your health care provider's instructions on:  ? Incision care.  ? Bandage (dressing) changes and removal.  ? Incision closure removal.  · Do not drink alcohol in the first week.  · Do not lift more than 5 pounds or play contact sports for 2 weeks after this test.  · Take medicines only as directed by your health care provider. Do not take medicine containing aspirin or non-steroidal anti-inflammatory medicines such as ibuprofen for 1 week after this test.  · It is your responsibility to get your test results.  Contact a health care provider if:  · You have increased bleeding from an incision  that results in more than a small spot of blood.  · You have redness, swelling, or increasing pain in any incisions.  · You notice a discharge or a bad smell coming from any of your incisions.  · You have a fever or chills.  Get help right away if:  · You develop swelling, bloating, or pain in your abdomen.  · You become dizzy or faint.  · You develop a rash.  · You are nauseous or vomit.  · You have difficulty breathing, feel short of breath, or feel faint.  · You develop chest pain.  · You have problems with your speech or vision.  · You have trouble balancing or moving your arms or legs.  This information is not intended to replace advice given to you by your health care provider. Make sure you discuss any questions you have with your health care provider.  Document Released: 04/02/2005 Document Revised: 02/19/2016 Document Reviewed: 11/09/2013  Elsevier Interactive Patient Education © 2018 Elsevier Inc.

## 2018-05-18 ENCOUNTER — Telehealth: Payer: Self-pay | Admitting: Gastroenterology

## 2018-05-18 LAB — SURGICAL PATHOLOGY

## 2018-05-18 NOTE — Telephone Encounter (Signed)
Pt is calling  To find out  Liver biopsy results  cb [phone]

## 2018-05-18 NOTE — Telephone Encounter (Signed)
Left vm letting pt know liver biopsy results are not back yet.

## 2018-05-18 NOTE — Telephone Encounter (Signed)
Patient LVM for you to call him back. No reason given.

## 2018-05-19 ENCOUNTER — Encounter: Payer: Self-pay | Admitting: Anatomic Pathology & Clinical Pathology

## 2018-06-01 ENCOUNTER — Encounter (INDEPENDENT_AMBULATORY_CARE_PROVIDER_SITE_OTHER): Payer: Self-pay

## 2018-06-01 ENCOUNTER — Encounter: Payer: Self-pay | Admitting: Gastroenterology

## 2018-06-01 ENCOUNTER — Ambulatory Visit (INDEPENDENT_AMBULATORY_CARE_PROVIDER_SITE_OTHER): Payer: BLUE CROSS/BLUE SHIELD | Admitting: Gastroenterology

## 2018-06-01 VITALS — BP 127/66 | HR 78 | Ht 68.0 in | Wt 191.0 lb

## 2018-06-01 DIAGNOSIS — R748 Abnormal levels of other serum enzymes: Secondary | ICD-10-CM | POA: Diagnosis not present

## 2018-06-01 MED ORDER — URSODIOL 300 MG PO CAPS: 600.0000 mg | ORAL_CAPSULE | Freq: Three times a day (TID) | ORAL | 3 refills | Status: DC

## 2018-06-01 NOTE — Progress Notes (Signed)
Primary Care Physician: Arnetha Courser, MD  Primary Gastroenterologist:  Dr. Lucilla Lame  Chief Complaint  Patient presents with  . Follow up liver biopsy results    HPI: Scott Rosario is a 57 y.o. male here for follow-up after having a liver biopsy.  The patient's liver biopsy with reviewed at [facility] and due to more questions the biopsy was sent to [facility] for evaluation.  The interpretation was that the most likely diagnosis was early [facility].  They could not completely rule it in but could not completely rule it out.  The patient has had abnormal liver enzymes for some time and comes in today for review of the pathology results.  There are no signs of any cirrhosis on the liver biopsy.  Current Outpatient Medications  Medication Sig Dispense Refill  . albuterol (PROVENTIL HFA;VENTOLIN HFA) 108 (90 Base) MCG/ACT inhaler Inhale 2 puffs into the lungs every 4 (four) hours as needed for wheezing or shortness of breath. 1 Inhaler 1  . Ascorbic Acid (VITAMIN C PO) Take by mouth.    . Ascorbic Acid (VITAMIN C) 100 MG tablet Take 1 tablet by mouth daily.    Marland Kitchen aspirin EC 81 MG tablet Take 81 mg by mouth daily.    Marland Kitchen atorvastatin (LIPITOR) 80 MG tablet Take 1 tablet (80 mg total) by mouth at bedtime. 30 tablet 2  . clopidogrel (PLAVIX) 75 MG tablet Take 75 mg by mouth daily.     Marland Kitchen glipiZIDE (GLUCOTROL XL) 10 MG 24 hr tablet Take 1 tablet (10 mg total) by mouth daily. 30 tablet 2  . ibuprofen (ADVIL,MOTRIN) 200 MG tablet Take 200 mg by mouth every 6 (six) hours as needed.    . Insulin Glargine (LANTUS SOLOSTAR) 100 UNIT/ML Solostar Pen Inject 10 Units into the skin at bedtime. 15 mL 2  . losartan (COZAAR) 50 MG tablet Take 1 tablet (50 mg total) by mouth daily. 30 tablet 2  . metFORMIN (GLUCOPHAGE) 500 MG tablet Take 1 tablet (500 mg total) by mouth 2 (two) times daily with a meal. 60 tablet 2  . metoprolol succinate (TOPROL-XL) 25 MG 24 hr tablet Take 1 tablet (25 mg  total) by mouth daily. 30 tablet 2  . pantoprazole (PROTONIX) 40 MG tablet Take 1 tablet (40 mg total) by mouth 2 (two) times daily. 60 tablet 2  . nitroGLYCERIN (NITROSTAT) 0.4 MG SL tablet Place 1 tablet (0.4 mg total) under the tongue every 5 (five) minutes as needed for chest pain. Max of 3 total; call 911 (Patient not taking: Reported on 04/25/2018) 25 tablet 5  . ursodiol (ACTIGALL) 300 MG capsule Take 2 capsules (600 mg total) by mouth 3 (three) times daily. 180 capsule 3   No current facility-administered medications for this visit.     Allergies as of 06/01/2018 - Review Complete 06/01/2018  Allergen Reaction Noted  . Canagliflozin Other (See Comments) 01/25/2017  . Semaglutide Diarrhea and Nausea And Vomiting 10/19/2017    ROS:  General: Negative for anorexia, weight loss, fever, chills, fatigue, weakness. ENT: Negative for hoarseness, difficulty swallowing , nasal congestion. CV: Negative for chest pain, angina, palpitations, dyspnea on exertion, peripheral edema.  Respiratory: Negative for dyspnea at rest, dyspnea on exertion, cough, sputum, wheezing.  GI: See history of present illness. GU:  Negative for dysuria, hematuria, urinary incontinence, urinary frequency, nocturnal urination.  Endo: Negative for unusual weight change.    Physical Examination:   BP 127/66   Pulse 78  Ht 5\' 8"  (1.727 m)   Wt 191 lb (86.6 kg)   BMI 29.04 kg/m   General: Well-nourished, well-developed in no acute distress.  Eyes: No icterus. Conjunctivae pink. Mouth: Oropharyngeal mucosa moist and pink , no lesions erythema or exudate. Lungs: Clear to auscultation bilaterally. Non-labored. Heart: Regular rate and rhythm, no murmurs rubs or gallops.  Abdomen: Bowel sounds are normal, nontender, nondistended, no hepatosplenomegaly or masses, no abdominal bruits or hernia , no rebound or guarding.   Extremities: No lower extremity edema. No clubbing or deformities. Neuro: Alert and oriented x 3.   Grossly intact. Skin: Warm and dry, no jaundice.   Psych: Alert and cooperative, normal mood and affect.  Labs:    Imaging Studies: US Biopsy (liver)  Result Date: 05/11/2018 INDICATION: 57 year old with elevated liver enzymes and request for random liver biopsy. EXAM: ULTRASOUND-GUIDED RANDOM LIVER BIOPSY MEDICATIONS: None. ANESTHESIA/SEDATION: Moderate (conscious) sedation was employed during this procedure. A total of Versed 1 mg and Fentanyl 50 mcg was administered intravenously. Moderate Sedation Time: 16 minutes. The patient's level of consciousness and vital signs were monitored continuously by radiology nursing throughout the procedure under my direct supervision. FLUOROSCOPY TIME:  None COMPLICATIONS: None immediate. PROCEDURE: Informed written consent was obtained from the patient after a thorough discussion of the procedural risks, benefits and alternatives. All questions were addressed. Maximal Sterile Barrier Technique was utilized including caps, mask, sterile gowns, sterile gloves, sterile drape, hand hygiene and skin antiseptic. A timeout was performed prior to the initiation of the procedure. Liver was evaluated with ultrasound. The right hepatic lobe was selected for biopsy. Right abdomen was prepped with chlorhexidine and sterile field was created. Liver extended below the subcostal region. The skin was anesthetized with 1% lidocaine. 14 gauge coaxial needle directed into the right hepatic lobe with real-time ultrasound guidance. A total of 3 core biopsies were obtained from the right hepatic lobe with an 18 gauge core device. Adequate specimens were obtained and placed in formalin. The 17 gauge needle was removed as Gel-Foam slurry was slowly injected. Needle was removed without complication. Bandage placed over the puncture site. FINDINGS: Core biopsies obtained from the right hepatic lobe. Adequate specimens obtained. No evidence for bleeding or hematoma formation. IMPRESSION:  Successful ultrasound-guided core biopsies of the right hepatic lobe. Electronically Signed   By: Markus Daft M.D.   On: 05/11/2018 11:37    Assessment and Plan:   Scott Rosario is a 57 y.o. y/o male who comes in for review of his liver biopsy results.  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  The patient has been explained what PSC is and the treatment options.  The patient has also been told that this may not be PSC but it is a leading differential diagnosis at this time.  The patient will be started on Urso 1800 mg a day divided into 600 mg 3 times a day.  She will follow-up in 2 months to review his progress and see if his liver enzymes are improving.  If they do not then the patient may need to be sent to [facility] for further evaluation.  The patient will also have baseline liver enzymes checked today.  The patient and his wife have been explained the plan and agree with it.    Lucilla Lame, MD. Marval Regal   Note: This dictation was prepared with Dragon dictation along with smaller phrase technology. Any transcriptional errors that result from this process are unintentional.

## 2018-06-02 LAB — HEPATIC FUNCTION PANEL
ALBUMIN: 4.5 g/dL (ref 3.5–5.5)
ALT: 24 IU/L (ref 0–44)
AST: 17 IU/L (ref 0–40)
Alkaline Phosphatase: 155 IU/L — ABNORMAL HIGH (ref 39–117)
Bilirubin Total: 0.8 mg/dL (ref 0.0–1.2)
Bilirubin, Direct: 0.18 mg/dL (ref 0.00–0.40)
TOTAL PROTEIN: 6.8 g/dL (ref 6.0–8.5)

## 2018-06-06 ENCOUNTER — Telehealth: Payer: Self-pay

## 2018-06-06 NOTE — Telephone Encounter (Signed)
Can you please call pharmacy and see if there is a genetic for this medication that his insurance will cover.

## 2018-06-06 NOTE — Telephone Encounter (Signed)
Pt wife is calling regarding pt rx being $700 they would like to see if she can help get it cheaper please call her

## 2018-06-07 ENCOUNTER — Ambulatory Visit: Payer: BLUE CROSS/BLUE SHIELD | Admitting: Gastroenterology

## 2018-06-07 NOTE — Telephone Encounter (Signed)
I spoke with the pharmacist and that is the generic.

## 2018-06-07 NOTE — Telephone Encounter (Signed)
PT IS CALLING AGAIN

## 2018-06-08 NOTE — Telephone Encounter (Signed)
That is the only medication for [facility] and if that is the generic price I have no other options except possibly calling the drug rep for this company and seeing if we can get the patient on an assistance program.

## 2018-06-09 NOTE — Telephone Encounter (Signed)
Pt has been advised there is no pt assistance for the Ursodiol. Advised to try GoodRx savings card or download the coupon from a percentage off the medication.

## 2018-06-12 ENCOUNTER — Other Ambulatory Visit: Payer: Self-pay

## 2018-06-12 MED ORDER — URSODIOL 250 MG PO TABS
ORAL_TABLET | ORAL | 11 refills | Status: DC
Start: 2018-06-12 — End: 2018-12-21

## 2018-07-13 ENCOUNTER — Ambulatory Visit (INDEPENDENT_AMBULATORY_CARE_PROVIDER_SITE_OTHER): Payer: Self-pay | Admitting: Family Medicine

## 2018-07-13 ENCOUNTER — Encounter: Payer: Self-pay | Admitting: Family Medicine

## 2018-07-13 VITALS — BP 138/70 | HR 79 | Temp 98.7°F | Resp 14 | Ht 68.0 in | Wt 191.2 lb

## 2018-07-13 DIAGNOSIS — J029 Acute pharyngitis, unspecified: Secondary | ICD-10-CM

## 2018-07-13 DIAGNOSIS — R0989 Other specified symptoms and signs involving the circulatory and respiratory systems: Secondary | ICD-10-CM

## 2018-07-13 DIAGNOSIS — J069 Acute upper respiratory infection, unspecified: Secondary | ICD-10-CM

## 2018-07-13 LAB — POCT INFLUENZA A/B
INFLUENZA A, POC: NEGATIVE
Influenza B, POC: NEGATIVE

## 2018-07-13 NOTE — Progress Notes (Signed)
BP 138/70   Pulse 79   Temp 98.7 F (37.1 C) (Oral)   Resp 14   Ht 5\' 8"  (1.727 m)   Wt 191 lb 3.2 oz (86.7 kg)   SpO2 99%   BMI 29.07 kg/m    Subjective:    Patient ID: Scott Rosario, male    DOB: 19-Aug-1961, 57 y.o.   MRN: [id number]  HPI: Scott Rosario is a 57 y.o. male  Chief Complaint  Patient presents with  . URI    onset 1 day symptoms include: sore throat, headache, dizziness, congestion and cough    HPI Patient is here for an acute visit Head and chest congestion; no body aches per se; mild cough; not significant No fever; some sore throat, better today than yesterday Symptoms just started yesterday morning or early hours; stayed in the bed all day yesterday; no energy He has tried Nyquil and Dayquil, tylenol severe cold and flu Sinus congestion, lots of sinus stuff No travel recently; no sick contacts  Depression screen [facility] 2/9 07/13/2018 04/13/2018 12/20/2017 10/19/2017 09/22/2017  Decreased Interest 0 0 0 0 0  Down, Depressed, Hopeless 0 0 0 0 0  PHQ - 2 Score 0 0 0 0 0  Altered sleeping 0 - - - -  Tired, decreased energy 0 - - - -  Change in appetite 0 - - - -  Feeling bad or failure about yourself  0 - - - -  Trouble concentrating 0 - - - -  Moving slowly or fidgety/restless 0 - - - -  Suicidal thoughts 0 - - - -  PHQ-9 Score 0 - - - -  Difficult doing work/chores Not difficult at all - - - -   Fall Risk  07/13/2018 04/24/2018 04/13/2018 12/20/2017 10/19/2017  Falls in the past year? No No No No No    Relevant past medical, surgical, family and social history reviewed Past Medical History:  Diagnosis Date  . Alkaline phosphatase elevation   . Aortic atherosclerosis ([place])    on CT 02/25/14  . Asthma   . CAD (coronary artery disease)   . Cirrhosis ([place])   . COPD (chronic obstructive pulmonary disease) ([place])   . Diabetes mellitus without complication ([place])   . Emphysema of lung ([place])   . GERD (gastroesophageal reflux disease)   . Heart  attack ([place])    2005, 2017 (stent after each)  . Hyperlipidemia   . Hypertension   . Pulmonary nodules    x 3, (2mm, 37mm, 64mm) chest CT February 25, 2014  . Renal cyst    on CT 02/25/14  . Tobacco use    Past Surgical History:  Procedure Laterality Date  . APPENDECTOMY    . CARDIAC CATHETERIZATION Left 03/09/2016   Procedure: Left Heart Cath and Coronary Angiography;  Surgeon: Corey Skains, MD;  Location: [facility];  Service: Cardiovascular;  Laterality: Left;  . CARDIAC CATHETERIZATION N/A 03/09/2016   Procedure: Coronary Stent Intervention;  Surgeon: Isaias Cowman, MD;  Location: [place] [facility];  Service: Cardiovascular;  Laterality: N/A;  . CORONARY ANGIOPLASTY WITH STENT PLACEMENT  10/25/2003   x 2  . ESOPHAGOGASTRODUODENOSCOPY (EGD) WITH PROPOFOL N/A 10/24/2017   Procedure: ESOPHAGOGASTRODUODENOSCOPY (EGD) WITH PROPOFOL;  Surgeon: Lucilla Lame, MD;  Location: [facility];  Service: Endoscopy;  Laterality: N/A;  Diabetic - oral meds   Family History  Problem Relation Age of Onset  . Diabetes Mother   . Hypertension Mother   .  Arthritis Mother   . Hyperlipidemia Mother   . Heart attack Father   . Epilepsy Father   . Diabetes Father   . Seizures Father   . Heart disease Father   . Hyperlipidemia Father   . Hypertension Father   . Stroke Father   . Diabetes Brother   . Hyperlipidemia Brother   . Hypertension Brother   . Cancer Maternal Grandmother        unknown  . Diabetes Paternal Grandmother   . Heart disease Paternal Grandmother   . Hyperlipidemia Paternal Grandmother   . Hypertension Paternal Grandmother   . Heart attack Paternal Grandfather    Social History   Tobacco Use  . Smoking status: Current Every Day Smoker    Packs/day: 0.50    Years: 40.00    Pack years: 20.00    Types: Cigarettes    Last attempt to quit: 09/09/2017    Years since quitting: 0.8  . Smokeless tobacco: Never Used  . Tobacco comment: (10/18/17 - currently  6-10 cigs/day)  Substance Use Topics  . Alcohol use: No    Alcohol/week: 0.0 standard drinks  . Drug use: No     Office Visit from 07/13/2018 in [facility]  AUDIT-C Score  0      Interim medical history since last visit reviewed. Allergies and medications reviewed  Review of Systems Per HPI unless specifically indicated above     Objective:    BP 138/70   Pulse 79   Temp 98.7 F (37.1 C) (Oral)   Resp 14   Ht 5\' 8"  (1.727 m)   Wt 191 lb 3.2 oz (86.7 kg)   SpO2 99%   BMI 29.07 kg/m   Wt Readings from Last 3 Encounters:  07/13/18 191 lb 3.2 oz (86.7 kg)  06/01/18 191 lb (86.6 kg)  04/25/18 190 lb 3.2 oz (86.3 kg)    Physical Exam  Constitutional: He appears well-developed and well-nourished. No distress.  HENT:  Mouth/Throat: Posterior oropharyngeal erythema (mildly injected) present. No oropharyngeal exudate or posterior oropharyngeal edema.  Eyes: No scleral icterus.  Cardiovascular: Normal rate and regular rhythm.  Pulmonary/Chest: Effort normal and breath sounds normal.  Lymphadenopathy:    He has no cervical adenopathy.  Neurological: He is alert.  Skin: He is not diaphoretic. No pallor.  Psychiatric: He has a normal mood and affect.   Diabetic Foot Form - Detailed   Diabetic Foot Exam - detailed Diabetic Foot exam was performed with the following findings:  Yes 07/13/2018  8:41 AM  Pulse Foot Exam completed.:  Yes  Right Dorsalis Pedis:  Present Left Dorsalis Pedis:  Present  Sensory Foot Exam Completed.:  Yes Semmes-Weinstein Monofilament Test R Site 1-Great Toe:  Pos L Site 1-Great Toe:  Pos        Results for orders placed or performed in visit on 07/13/18  POCT Influenza A/B  Result Value Ref Range   Influenza A, POC Negative Negative   Influenza B, POC Negative Negative      Assessment & Plan:   Problem List Items Addressed This Visit    None    Visit Diagnoses    Sore throat    -  Primary   Relevant Orders    POCT Influenza A/B (Completed)   Chest congestion       Relevant Orders   POCT Influenza A/B (Completed)   Viral upper respiratory tract infection       antibiotics not indicated; rest, hydration; reasons  to seek care reviewed       Follow up plan: No follow-ups on file.  An after-visit summary was printed and given to the patient at [facility].  Please see the patient instructions which may contain other information and recommendations beyond what is mentioned above in the assessment and plan.  No orders of the defined types were placed in this encounter.   Orders Placed This Encounter  Procedures  . POCT Influenza A/B

## 2018-07-13 NOTE — Patient Instructions (Addendum)
Try to use PLAIN allergy medicine without the decongestant Avoid: phenylephrine, phenylpropanolamine, and pseudoephredine  Try vitamin C (orange juice if not diabetic or vitamin C tablets) and drink green tea to help your immune system during your illness Get plenty of rest and hydration  Call or go to urgent care if getting worse, developing fever, significant cough, etc  Viral Illness, Adult Viruses are tiny germs that can get into a person's body and cause illness. There are many different types of viruses, and they cause many types of illness. Viral illnesses can range from mild to severe. They can affect various parts of the body. Common illnesses that are caused by a virus include colds and the flu. Viral illnesses also include serious conditions such as HIV/AIDS (human immunodeficiency virus/acquired immunodeficiency syndrome). A few viruses have been linked to certain cancers. What are the causes? Many types of viruses can cause illness. Viruses invade cells in your body, multiply, and cause the infected cells to malfunction or die. When the cell dies, it releases more of the virus. When this happens, you develop symptoms of the illness, and the virus continues to spread to other cells. If the virus takes over the function of the cell, it can cause the cell to divide and grow out of control, as is the case when a virus causes cancer. Different viruses get into the body in different ways. You can get a virus by:  Swallowing food or water that is contaminated with the virus.  Breathing in droplets that have been coughed or sneezed into the air by an infected person.  Touching a surface that has been contaminated with the virus and then touching your eyes, nose, or mouth.  Being bitten by an insect or animal that carries the virus.  Having sexual contact with a person who is infected with the virus.  Being exposed to blood or fluids that contain the virus, either through an open cut or  during a transfusion.  If a virus enters your body, your body's defense system (immune system) will try to fight the virus. You may be at higher risk for a viral illness if your immune system is weak. What are the signs or symptoms? Symptoms vary depending on the type of virus and the location of the cells that it invades. Common symptoms of the main types of viral illnesses include: Cold and flu viruses  Fever.  Headache.  Sore throat.  Muscle aches.  Nasal congestion.  Cough. Digestive system (gastrointestinal) viruses  Fever.  Abdominal pain.  Nausea.  Diarrhea. Liver viruses (hepatitis)  Loss of appetite.  Tiredness.  Yellowing of the skin (jaundice). Brain and spinal cord viruses  Fever.  Headache.  Stiff neck.  Nausea and vomiting.  Confusion or sleepiness. Skin viruses  Warts.  Itching.  Rash. Sexually transmitted viruses  Discharge.  Swelling.  Redness.  Rash. How is this treated? Viruses can be difficult to treat because they live within cells. Antibiotic medicines do not treat viruses because these drugs do not get inside cells. Treatment for a viral illness may include:  Resting and drinking plenty of fluids.  Medicines to relieve symptoms. These can include over-the-counter medicine for pain and fever, medicines for cough or congestion, and medicines to relieve diarrhea.  Antiviral medicines. These drugs are available only for certain types of viruses. They may help reduce flu symptoms if taken early. There are also many antiviral medicines for hepatitis and HIV/AIDS.  Some viral illnesses can be prevented with vaccinations.  A common example is the flu shot. Follow these instructions at home: Medicines   Take over-the-counter and prescription medicines only as told by your health care provider.  If you were prescribed an antiviral medicine, take it as told by your health care provider. Do not stop taking the medicine even if you  start to feel better.  Be aware of when antibiotics are needed and when they are not needed. Antibiotics do not treat viruses. If your health care provider thinks that you may have a bacterial infection as well as a viral infection, you may get an antibiotic. ? Do not ask for an antibiotic prescription if you have been diagnosed with a viral illness. That will not make your illness go away faster. ? Frequently taking antibiotics when they are not needed can lead to antibiotic resistance. When this develops, the medicine no longer works against the bacteria that it normally fights. General instructions  Drink enough fluids to keep your urine clear or pale yellow.  Rest as much as possible.  Return to your normal activities as told by your health care provider. Ask your health care provider what activities are safe for you.  Keep all follow-up visits as told by your health care provider. This is important. How is this prevented? Take these actions to reduce your risk of viral infection:  Eat a healthy diet and get enough rest.  Wash your hands often with soap and water. This is especially important when you are in public places. If soap and water are not available, use hand sanitizer.  Avoid close contact with friends and family who have a viral illness.  If you travel to areas where viral gastrointestinal infection is common, avoid drinking water or eating raw food.  Keep your immunizations up to date. Get a flu shot every year as told by your health care provider.  Do not share toothbrushes, nail clippers, razors, or needles with other people.  Always practice safe sex.  Contact a health care provider if:  You have symptoms of a viral illness that do not go away.  Your symptoms come back after going away.  Your symptoms get worse. Get help right away if:  You have trouble breathing.  You have a severe headache or a stiff neck.  You have severe vomiting or abdominal  pain. This information is not intended to replace advice given to you by your health care provider. Make sure you discuss any questions you have with your health care provider. Document Released: 01/23/2016 Document Revised: 02/25/2016 Document Reviewed: 01/23/2016 Elsevier Interactive Patient Education  [employer].

## 2018-07-24 ENCOUNTER — Telehealth: Payer: Self-pay

## 2018-07-24 ENCOUNTER — Other Ambulatory Visit: Payer: Self-pay

## 2018-07-24 DIAGNOSIS — Z1211 Encounter for screening for malignant neoplasm of colon: Secondary | ICD-10-CM

## 2018-07-24 NOTE — Telephone Encounter (Signed)
Pt's wife called today stating pt has reduced his Ursodiol from 7 pills daily to 4 pills daily due to severe side effects. He has been experiencing fatique, nausea, abdominal pain, diarrhea and cramping. His wife states he has been coming home early from work because the side effects are bothering him so much. The side effects have reduced since lowering his dose to 4 pills. She is wanting to know if this will be okay? Should they have any labs done sooner?   Also, she would like to know if there is any special diet his should be doing to help his liver?

## 2018-07-24 NOTE — Telephone Encounter (Signed)
Spoke with pt and advised her per Dr. Allen Norris it is okay for pt to reduce Ursodiol down to 4 pills daily to help with his side effects. Pt will have enzymes rechecked in 2 to 3 weeks.

## 2018-07-24 NOTE — Telephone Encounter (Signed)
Yes that is Ok. We should check his liver enzymes in 2-3 weeks.

## 2018-07-25 ENCOUNTER — Encounter: Payer: Self-pay | Admitting: Nurse Practitioner

## 2018-07-25 ENCOUNTER — Ambulatory Visit: Payer: Self-pay | Admitting: Family Medicine

## 2018-07-25 ENCOUNTER — Ambulatory Visit (INDEPENDENT_AMBULATORY_CARE_PROVIDER_SITE_OTHER): Payer: Self-pay | Admitting: Nurse Practitioner

## 2018-07-25 VITALS — BP 120/70 | HR 76 | Temp 98.4°F | Resp 16 | Ht 68.0 in | Wt 190.1 lb

## 2018-07-25 DIAGNOSIS — E119 Type 2 diabetes mellitus without complications: Secondary | ICD-10-CM

## 2018-07-25 DIAGNOSIS — Z716 Tobacco abuse counseling: Secondary | ICD-10-CM

## 2018-07-25 DIAGNOSIS — E782 Mixed hyperlipidemia: Secondary | ICD-10-CM

## 2018-07-25 DIAGNOSIS — K219 Gastro-esophageal reflux disease without esophagitis: Secondary | ICD-10-CM

## 2018-07-25 DIAGNOSIS — J449 Chronic obstructive pulmonary disease, unspecified: Secondary | ICD-10-CM

## 2018-07-25 DIAGNOSIS — I10 Essential (primary) hypertension: Secondary | ICD-10-CM

## 2018-07-25 DIAGNOSIS — I2510 Atherosclerotic heart disease of native coronary artery without angina pectoris: Secondary | ICD-10-CM

## 2018-07-25 DIAGNOSIS — I2583 Coronary atherosclerosis due to lipid rich plaque: Secondary | ICD-10-CM

## 2018-07-25 DIAGNOSIS — I700 Atherosclerosis of aorta: Secondary | ICD-10-CM

## 2018-07-25 DIAGNOSIS — D692 Other nonthrombocytopenic purpura: Secondary | ICD-10-CM | POA: Insufficient documentation

## 2018-07-25 DIAGNOSIS — I252 Old myocardial infarction: Secondary | ICD-10-CM | POA: Insufficient documentation

## 2018-07-25 DIAGNOSIS — Z720 Tobacco use: Secondary | ICD-10-CM

## 2018-07-25 DIAGNOSIS — Z79899 Other long term (current) drug therapy: Secondary | ICD-10-CM

## 2018-07-25 MED ORDER — INSULIN GLARGINE 100 UNIT/ML SOLOSTAR PEN: 12.0000 [IU] | PEN_INJECTOR | Freq: Every day | SUBCUTANEOUS | 2 refills | Status: DC

## 2018-07-25 MED ORDER — LOSARTAN POTASSIUM 50 MG PO TABS: 50.0000 mg | ORAL_TABLET | Freq: Every day | ORAL | 2 refills | Status: DC

## 2018-07-25 MED ORDER — GLIPIZIDE ER 10 MG PO TB24: 10.0000 mg | ORAL_TABLET | Freq: Every day | ORAL | 2 refills | Status: DC

## 2018-07-25 MED ORDER — METFORMIN HCL 500 MG PO TABS: 500.0000 mg | ORAL_TABLET | Freq: Two times a day (BID) | ORAL | 2 refills | Status: DC

## 2018-07-25 MED ORDER — METOPROLOL SUCCINATE ER 25 MG PO TB24: 25.0000 mg | ORAL_TABLET | Freq: Every day | ORAL | 2 refills | Status: DC

## 2018-07-25 MED ORDER — PANTOPRAZOLE SODIUM 40 MG PO TBEC: 40.0000 mg | DELAYED_RELEASE_TABLET | Freq: Two times a day (BID) | ORAL | 2 refills | Status: DC

## 2018-07-25 MED ORDER — ATORVASTATIN CALCIUM 80 MG PO TABS: 80.0000 mg | ORAL_TABLET | Freq: Every day | ORAL | 2 refills | Status: DC

## 2018-07-25 NOTE — Progress Notes (Signed)
Name: Delvecchio Madole   MRN: [id number]    DOB: 03-20-1961   Date:07/25/2018       Progress Note  Subjective  Chief Complaint  Chief Complaint  Patient presents with  . Diabetes  . Hypertension    HPI  Hypertension: rx metoprolol 25mg  and losartan 50mg  daily States checks it here and there and its 120's/60's. No headaches, chest pain, blurry vision  Hyperlipidemia: takes atorvastatin 80mg  nightly, eats more chicken and fish, salads trying to not eat as much red meat  Diabetes: rx glipizide 10mg , metformin 500mg  BID, insulin glargine 12 units at night. Occasionally checking fasting blood sugars 120-130's  GERD: takes pantroprazole; states overall is okay- soup and spaghetti causes triggers  Cirrhosis: follows up with Dr. Allen Norris; states that why he is taking the actigall   COPD: feels breathing is good, denies shortness of breath, wheezing, does have a strong productive cough. Smoking 1 ppd, tried to cut back to less than 0.5ppd but its been   History of MI: on plavix, and 81mg  aspirin daily, senile purpura noted on bilateral arms.   Patient Active Problem List   Diagnosis Date Noted  . History of MI (myocardial infarction) 07/25/2018  . Overweight (BMI 25.0-29.9) 12/20/2017  . Gastritis without bleeding   . Diarrhea 10/18/2017  . Acquired trigger finger 02/15/2017  . Snoring 05/07/2016  . Medication monitoring encounter 08/19/2015  . COPD (chronic obstructive pulmonary disease) ([place])   . Emphysema of lung ([place])   . Diabetes mellitus without complication ([place])   . GERD (gastroesophageal reflux disease)   . Hyperlipidemia   . Hypertension   . Tobacco use   . Pulmonary nodules   . CAD (coronary artery disease)   . Cirrhosis ([place])   . Alkaline phosphatase elevation   . Aortic atherosclerosis ([place])   . Multiple pulmonary nodules determined by computed tomography of lung 12/31/2014  . COPD mixed type ([place]) 12/31/2014    Past Medical History:  Diagnosis Date  .  Alkaline phosphatase elevation   . Aortic atherosclerosis ([place])    on CT 02/25/14  . Asthma   . CAD (coronary artery disease)   . Cirrhosis ([place])   . COPD (chronic obstructive pulmonary disease) ([place])   . Diabetes mellitus without complication ([place])   . Emphysema of lung ([place])   . GERD (gastroesophageal reflux disease)   . Heart attack ([place])    2005, 2017 (stent after each)  . Hyperlipidemia   . Hypertension   . Pulmonary nodules    x 3, (48mm, 58mm, 38mm) chest CT February 25, 2014  . Renal cyst    on CT 02/25/14  . Tobacco use     Past Surgical History:  Procedure Laterality Date  . APPENDECTOMY    . CARDIAC CATHETERIZATION Left 03/09/2016   Procedure: Left Heart Cath and Coronary Angiography;  Surgeon: Corey Skains, MD;  Location: [facility];  Service: Cardiovascular;  Laterality: Left;  . CARDIAC CATHETERIZATION N/A 03/09/2016   Procedure: Coronary Stent Intervention;  Surgeon: Isaias Cowman, MD;  Location: [facility];  Service: Cardiovascular;  Laterality: N/A;  . CORONARY ANGIOPLASTY WITH STENT PLACEMENT  10/25/2003   x 2  . ESOPHAGOGASTRODUODENOSCOPY (EGD) WITH PROPOFOL N/A 10/24/2017   Procedure: ESOPHAGOGASTRODUODENOSCOPY (EGD) WITH PROPOFOL;  Surgeon: Lucilla Lame, MD;  Location: [facility];  Service: Endoscopy;  Laterality: N/A;  Diabetic - oral meds    Social History   Tobacco Use  . Smoking status: Current Every Day Smoker  Packs/day: 0.50    Years: 40.00    Pack years: 20.00    Types: Cigarettes    Last attempt to quit: 09/09/2017    Years since quitting: 0.8  . Smokeless tobacco: Never Used  . Tobacco comment: (10/18/17 - currently 6-10 cigs/day)  Substance Use Topics  . Alcohol use: No    Alcohol/week: 0.0 standard drinks     Current Outpatient Medications:  .  albuterol (PROVENTIL HFA;VENTOLIN HFA) 108 (90 Base) MCG/ACT inhaler, Inhale 2 puffs into the lungs every 4 (four) hours as needed for wheezing or shortness of  breath., Disp: 1 Inhaler, Rfl: 1 .  Ascorbic Acid (VITAMIN C PO), Take by mouth., Disp: , Rfl:  .  Ascorbic Acid (VITAMIN C) 100 MG tablet, Take 1 tablet by mouth daily., Disp: , Rfl:  .  aspirin EC 81 MG tablet, Take 81 mg by mouth daily., Disp: , Rfl:  .  atorvastatin (LIPITOR) 80 MG tablet, Take 1 tablet (80 mg total) by mouth at bedtime., Disp: 30 tablet, Rfl: 2 .  clopidogrel (PLAVIX) 75 MG tablet, Take 75 mg by mouth daily. , Disp: , Rfl:  .  glipiZIDE (GLUCOTROL XL) 10 MG 24 hr tablet, Take 1 tablet (10 mg total) by mouth daily., Disp: 30 tablet, Rfl: 2 .  ibuprofen (ADVIL,MOTRIN) 200 MG tablet, Take 200 mg by mouth every 6 (six) hours as needed., Disp: , Rfl:  .  Insulin Glargine (LANTUS SOLOSTAR) 100 UNIT/ML Solostar Pen, Inject 12 Units into the skin at bedtime., Disp: 15 mL, Rfl: 2 .  losartan (COZAAR) 50 MG tablet, Take 1 tablet (50 mg total) by mouth daily., Disp: 30 tablet, Rfl: 2 .  metFORMIN (GLUCOPHAGE) 500 MG tablet, Take 1 tablet (500 mg total) by mouth 2 (two) times daily with a meal., Disp: 60 tablet, Rfl: 2 .  metoprolol succinate (TOPROL-XL) 25 MG 24 hr tablet, Take 1 tablet (25 mg total) by mouth daily., Disp: 30 tablet, Rfl: 2 .  nitroGLYCERIN (NITROSTAT) 0.4 MG SL tablet, Place 1 tablet (0.4 mg total) under the tongue every 5 (five) minutes as needed for chest pain. Max of 3 total; call 911, Disp: 25 tablet, Rfl: 5 .  pantoprazole (PROTONIX) 40 MG tablet, Take 1 tablet (40 mg total) by mouth 2 (two) times daily., Disp: 60 tablet, Rfl: 2 .  ursodiol (ACTIGALL) 250 MG tablet, Take 2 tablets (500mg ) in the morning, 2 tablets in the afternoon and 3 tablets before bedtime, Disp: 210 tablet, Rfl: 11 .  ursodiol (ACTIGALL) 300 MG capsule, Take 2 capsules (600 mg total) by mouth 3 (three) times daily., Disp: 180 capsule, Rfl: 3  Allergies  Allergen Reactions  . Canagliflozin Other (See Comments)    adverse reactions: Fatigue and Zombie feeling  adverse reactions: Fatigue and  Zombie feeling   . Semaglutide Diarrhea and Nausea And Vomiting    Ozempic  Acid Reflux    ROS   No other specific complaints in a complete review of systems (except as listed in HPI above).  Objective  Vitals:   07/25/18 0833 07/25/18 1009  BP: (!) 144/66 120/70  Pulse: 76   Resp: 16   Temp: 98.4 F (36.9 C)   TempSrc: Oral   SpO2: 99%   Weight: 190 lb 1.6 oz (86.2 kg)   Height: 5\' 8"  (1.727 m)      Body mass index is 28.9 kg/m.  Nursing Note and Vital Signs reviewed.  Physical Exam  Constitutional: Patient appears well-developed and well-nourished. No  distress.  HENT: Head: Normocephalic and atraumatic.  Nose: Nose normal. Mouth/Throat: Oropharynx is clear and moist. No oropharyngeal exudate.  Eyes: Conjunctivae and EOM are normal. Pupils are equal, round, and reactive to light. No scleral icterus.  Neck: Normal range of motion. Neck supple. No JVD present. No thyromegaly present.  Cardiovascular: Normal rate, regular rhythm and normal heart sounds.  No murmur heard. No BLE edema. Pulmonary/Chest: Effort normal and breath sounds mildly diminished bilaterally throughout. No respiratory distress. Abdominal: Soft. Bowel sounds are normal, no distension. There is no tenderness. no masses Musculoskeletal: Normal range of motion, no joint effusions. No gross deformities Neurological: he is alert and oriented to person, place, and time. No cranial nerve deficit. Coordination, balance, strength, speech and gait are normal.  Skin: Skin is warm and dry. No rash noted. No erythema. bruising noted to bilateral arms Psychiatric: Patient has a normal mood and affect. behavior is normal. Judgment and thought content normal.    No results found for this or any previous visit (from the past 48 hour(s)).  Assessment & Plan  1. Diabetes mellitus without complication (HCC) diet - COMPLETE METABOLIC PANEL WITH GFR - HgB A1c - glipiZIDE (GLUCOTROL XL) 10 MG 24 hr tablet; Take 1  tablet (10 mg total) by mouth daily.  Dispense: 30 tablet; Refill: 2 - Insulin Glargine (LANTUS SOLOSTAR) 100 UNIT/ML Solostar Pen; Inject 12 Units into the skin at bedtime.  Dispense: 15 mL; Refill: 2 - metFORMIN (GLUCOPHAGE) 500 MG tablet; Take 1 tablet (500 mg total) by mouth 2 (two) times daily with a meal.  Dispense: 60 tablet; Refill: 2  2. Essential hypertension Diet, quit smoking  - COMPLETE METABOLIC PANEL WITH GFR - metoprolol succinate (TOPROL-XL) 25 MG 24 hr tablet; Take 1 tablet (25 mg total) by mouth daily.  Dispense: 30 tablet; Refill: 2 - losartan (COZAAR) 50 MG tablet; Take 1 tablet (50 mg total) by mouth daily.  Dispense: 30 tablet; Refill: 2  3. Mixed hyperlipidemia Diet  - Lipid Profile - atorvastatin (LIPITOR) 80 MG tablet; Take 1 tablet (80 mg total) by mouth at bedtime.  Dispense: 30 tablet; Refill: 2  4. Gastroesophageal reflux disease without esophagitis Diet  - pantoprazole (PROTONIX) 40 MG tablet; Take 1 tablet (40 mg total) by mouth 2 (two) times daily.  Dispense: 60 tablet; Refill: 2  5. Aortic atherosclerosis (HCC) Bp, lipid control, smoking cessation   6. Tobacco use Back to 1ppd  7. Coronary artery disease due to lipid rich plaque Sees cards   8. Chronic obstructive pulmonary disease, unspecified COPD type ([place]) Not on medication at this time, stable   9. Medication management  - COMPLETE METABOLIC PANEL WITH GFR  10. History of MI (myocardial infarction) Sees cards   11. Encounter for smoking cessation counseling Spent greater than 3 min discussing options states not ready to try medication at this time but will work on cutting down   12. Senile purpura (HCC) Taking aspirin and plavix

## 2018-07-25 NOTE — Patient Instructions (Addendum)
-   We are here to help you quit smoking; please let us know if you would be interested in chantix or wellbutrin   Your goal blood pressure is less than 140 mmHg on top and 90 on the bottom. Try to follow the DASH guidelines (DASH stands for Dietary Approaches to Stop Hypertension) Try to limit the sodium in your diet.  Ideally, consume less than 1.5 grams (less than 1,500mg ) per day. Do not add salt when cooking or at the table.  Check the sodium amount on labels when shopping, and choose items lower in sodium when given a choice. Avoid or limit foods that already contain a lot of sodium. Eat a diet rich in fruits and vegetables and whole grains.

## 2018-07-26 LAB — COMPLETE METABOLIC PANEL WITH GFR
AG RATIO: 2 (calc) (ref 1.0–2.5)
ALBUMIN MSPROF: 4.3 g/dL (ref 3.6–5.1)
ALT: 18 U/L (ref 9–46)
AST: 13 U/L (ref 10–35)
Alkaline phosphatase (APISO): 156 U/L — ABNORMAL HIGH (ref 40–115)
BUN: 14 mg/dL (ref 7–25)
CO2: 29 mmol/L (ref 20–32)
Calcium: 9.2 mg/dL (ref 8.6–10.3)
Chloride: 104 mmol/L (ref 98–110)
Creat: 0.74 mg/dL (ref 0.70–1.33)
GFR, EST NON AFRICAN AMERICAN: 102 mL/min/{1.73_m2} (ref >=60)
GFR, Est African American: 118 mL/min/{1.73_m2} (ref >=60)
GLOBULIN: 2.2 g/dL (ref 1.9–3.7)
Glucose, Bld: 169 mg/dL — ABNORMAL HIGH (ref 65–99)
POTASSIUM: 4.4 mmol/L (ref 3.5–5.3)
SODIUM: 139 mmol/L (ref 135–146)
Total Bilirubin: 1 mg/dL (ref 0.2–1.2)
Total Protein: 6.5 g/dL (ref 6.1–8.1)

## 2018-07-26 LAB — LIPID PANEL
CHOL/HDL RATIO: 3.5 (calc) (ref <5.0)
Cholesterol: 110 mg/dL (ref <200)
HDL: 31 mg/dL — AB (ref >40)
LDL Cholesterol (Calc): 58 mg/dL (calc)
Non-HDL Cholesterol (Calc): 79 mg/dL (calc) (ref <130)
Triglycerides: 125 mg/dL (ref <150)

## 2018-07-26 LAB — HEMOGLOBIN A1C
Hgb A1c MFr Bld: 8.3 % of total Hgb — ABNORMAL HIGH (ref <5.7)
MEAN PLASMA GLUCOSE: 192 (calc)
eAG (mmol/L): 10.6 (calc)

## 2018-08-09 ENCOUNTER — Encounter: Payer: Self-pay | Admitting: Anesthesiology

## 2018-08-09 ENCOUNTER — Other Ambulatory Visit: Payer: Self-pay

## 2018-08-09 ENCOUNTER — Encounter: Payer: Self-pay | Admitting: *Deleted

## 2018-08-09 NOTE — Anesthesia Preprocedure Evaluation (Deleted)
Anesthesia Evaluation    History of Anesthesia Complications Negative for: history of anesthetic complications  Airway Mallampati: I  TM Distance: >3 FB Neck ROM: Full    Dental  (+)    Pulmonary COPD, Current Smoker,  Snoring           Cardiovascular Exercise Tolerance: Good hypertension, + CAD, + Past MI (2005, 2017) and + Cardiac Stents    Cardiology note 10/19/17 (prior to last endoscopy that month):  Plan  -Continue current medical regimen for hypertension control stable at this time without side effects or symptoms  -Continue moderate to high intensive cholesterol therapy for further future risk reduction in cardiovascular disease  -Continue significant risk factor modification medications and therapy listed above for coronary artery disease which appears clinically stable at this time  -Patient was counseled on the importance of complete and total abstinence from all forms of tobacco.  -The patient is considered at lowest risk possible for planned GI procedure next week, with exercise treadmill test today showing good exercise tolerance and no evidence of arrhythmia or ischemia. -May discontinue Plavix 7 days prior to the procedure, but continue daily low-dose aspirin indefinitely.    Neuro/Psych negative neurological ROS     GI/Hepatic GERD  ,  Endo/Other  diabetes, Type 2  Renal/GU negative Renal ROS     Musculoskeletal   Abdominal   Peds  Hematology negative hematology ROS (+)   Anesthesia Other Findings   Reproductive/Obstetrics                             Anesthesia Physical  Anesthesia Plan  ASA: III  Anesthesia Plan: General   Post-op Pain Management:    Induction: Intravenous  PONV Risk Score and Plan: 2 and TIVA and Propofol infusion  Airway Management Planned: Natural Airway  Additional Equipment:   Intra-op Plan:   Post-operative Plan:   Informed Consent:     Plan Discussed with:   Anesthesia Plan Comments:         Anesthesia Quick Evaluation

## 2018-08-10 NOTE — Discharge Instructions (Signed)
General Anesthesia, Adult, Care After °These instructions provide you with information about caring for yourself after your procedure. Your health care provider may also give you more specific instructions. Your treatment has been planned according to current medical practices, but problems sometimes occur. Call your health care provider if you have any problems or questions after your procedure. °What can I expect after the procedure? °After the procedure, it is common to have: °· Vomiting. °· A sore throat. °· Mental slowness. ° °It is common to feel: °· Nauseous. °· Cold or shivery. °· Sleepy. °· Tired. °· Sore or achy, even in parts of your body where you did not have surgery. ° °Follow these instructions at home: °For at least 24 hours after the procedure: °· Do not: °? Participate in activities where you could fall or become injured. °? Drive. °? Use heavy machinery. °? Drink alcohol. °? Take sleeping pills or medicines that cause drowsiness. °? Make important decisions or sign legal documents. °? Take care of children on your own. °· Rest. °Eating and drinking °· If you vomit, drink water, juice, or soup when you can drink without vomiting. °· Drink enough fluid to keep your urine clear or pale yellow. °· Make sure you have little or no nausea before eating solid foods. °· Follow the diet recommended by your health care provider. °General instructions °· Have a responsible adult stay with you until you are awake and alert. °· Return to your normal activities as told by your health care provider. Ask your health care provider what activities are safe for you. °· Take over-the-counter and prescription medicines only as told by your health care provider. °· If you smoke, do not smoke without supervision. °· Keep all follow-up visits as told by your health care provider. This is important. °Contact a health care provider if: °· You continue to have nausea or vomiting at home, and medicines are not helpful. °· You  cannot drink fluids or start eating again. °· You cannot urinate after 8-12 hours. °· You develop a skin rash. °· You have fever. °· You have increasing redness at the site of your procedure. °Get help right away if: °· You have difficulty breathing. °· You have chest pain. °· You have unexpected bleeding. °· You feel that you are having a life-threatening or urgent problem. °This information is not intended to replace advice given to you by your health care provider. Make sure you discuss any questions you have with your health care provider. °Document Released: 12/20/2000 Document Revised: 02/16/2016 Document Reviewed: 08/28/2015 °Elsevier Interactive Patient Education © 2018 [employer] ° °

## 2018-08-14 ENCOUNTER — Other Ambulatory Visit: Payer: Self-pay

## 2018-08-14 MED ORDER — PEG 3350-KCL-NABCB-NACL-NASULF 236 G PO SOLR: ORAL | 0 refills | Status: DC

## 2018-08-16 ENCOUNTER — Telehealth: Payer: Self-pay | Admitting: Gastroenterology

## 2018-08-16 NOTE — Telephone Encounter (Signed)
Patients wife left voicemail on 11.20.19 @6 :55AM wanting to cancel patients procedure with Dr.Wohl for Monday 11.25.19. Patients wife states they will cb to reschedule. Please cancel procedure. Patient had ov 9.5.19.

## 2018-08-16 NOTE — Telephone Encounter (Signed)
Contacted [place] to cancel procedure scheduled for Monday.

## 2018-08-21 ENCOUNTER — Ambulatory Visit
Admission: RE | Admit: 2018-08-21 | Payer: BLUE CROSS/BLUE SHIELD | Source: Ambulatory Visit | Admitting: Gastroenterology

## 2018-08-21 SURGERY — COLONOSCOPY WITH PROPOFOL
Anesthesia: Choice

## 2018-10-25 ENCOUNTER — Ambulatory Visit: Payer: Self-pay | Admitting: Family Medicine

## 2018-10-31 LAB — HM DIABETES EYE EXAM

## 2018-11-01 ENCOUNTER — Other Ambulatory Visit: Payer: Self-pay | Admitting: Nurse Practitioner

## 2018-11-01 DIAGNOSIS — K219 Gastro-esophageal reflux disease without esophagitis: Secondary | ICD-10-CM

## 2018-11-21 ENCOUNTER — Telehealth: Payer: Self-pay | Admitting: Gastroenterology

## 2018-11-27 ENCOUNTER — Other Ambulatory Visit: Payer: Self-pay | Admitting: Nurse Practitioner

## 2018-11-27 ENCOUNTER — Other Ambulatory Visit: Payer: Self-pay | Admitting: Family Medicine

## 2018-11-27 ENCOUNTER — Ambulatory Visit: Payer: BLUE CROSS/BLUE SHIELD | Admitting: Gastroenterology

## 2018-11-27 DIAGNOSIS — I10 Essential (primary) hypertension: Secondary | ICD-10-CM

## 2018-11-27 DIAGNOSIS — E119 Type 2 diabetes mellitus without complications: Secondary | ICD-10-CM

## 2018-11-28 NOTE — Telephone Encounter (Signed)
Patient no showed in January but does have an appt tomorrow Lab Results  Component Value Date   CREATININE 0.74 07/25/2018   Lab Results  Component Value Date   K 4.4 07/25/2018   Rx approved

## 2018-11-28 NOTE — Telephone Encounter (Signed)
error 

## 2018-11-29 ENCOUNTER — Encounter: Payer: Self-pay | Admitting: Family Medicine

## 2018-11-29 ENCOUNTER — Ambulatory Visit: Payer: Self-pay | Admitting: Family Medicine

## 2018-11-29 VITALS — BP 120/64 | HR 85 | Temp 97.7°F | Resp 14 | Ht 68.0 in | Wt 184.0 lb

## 2018-11-29 DIAGNOSIS — L600 Ingrowing nail: Secondary | ICD-10-CM | POA: Insufficient documentation

## 2018-11-29 DIAGNOSIS — R748 Abnormal levels of other serum enzymes: Secondary | ICD-10-CM

## 2018-11-29 DIAGNOSIS — E119 Type 2 diabetes mellitus without complications: Secondary | ICD-10-CM

## 2018-11-29 DIAGNOSIS — R109 Unspecified abdominal pain: Secondary | ICD-10-CM

## 2018-11-29 DIAGNOSIS — K7469 Other cirrhosis of liver: Secondary | ICD-10-CM

## 2018-11-29 DIAGNOSIS — Z720 Tobacco use: Secondary | ICD-10-CM

## 2018-11-29 DIAGNOSIS — Z5181 Encounter for therapeutic drug level monitoring: Secondary | ICD-10-CM

## 2018-11-29 DIAGNOSIS — J439 Emphysema, unspecified: Secondary | ICD-10-CM

## 2018-11-29 DIAGNOSIS — E782 Mixed hyperlipidemia: Secondary | ICD-10-CM

## 2018-11-29 HISTORY — DX: Ingrowing nail: L60.0

## 2018-11-29 NOTE — Patient Instructions (Addendum)
I do encourage you to quit smoking Call [phone] to sign up for smoking cessation classes You can call 1-800-QUIT-NOW to talk with a smoking cessation coach  We'll set you up for the ultrasound of your abdomen We can get a CT scan if needed, let me know if anything changes or worsens, or go to the ER if needed We'll get the lung scan  Watch your feet carefully, twice a day until those places are healed   Health Risks of Smoking Smoking cigarettes is very bad for your health. Tobacco smoke has over 200 known poisons in it. It contains the poisonous gases nitrogen oxide and carbon monoxide. There are over 60 chemicals in tobacco smoke that cause cancer. Smoking is difficult to quit because a chemical in tobacco, called nicotine, causes addiction or dependence. When you smoke and inhale, nicotine is absorbed rapidly into the bloodstream through your lungs. Both inhaled and non-inhaled nicotine may be addictive. What are the risks of cigarette smoke? Cigarette smokers have an increased risk of many serious medical problems, including:  Lung cancer.  Lung disease, such as pneumonia, bronchitis, and emphysema.  Chest pain (angina) and heart attack because the heart is not getting enough oxygen.  Heart disease and peripheral blood vessel disease.  High blood pressure (hypertension).  Stroke.  Oral cancer, including cancer of the lip, mouth, or voice box.  Bladder cancer.  Pancreatic cancer.  Cervical cancer.  Pregnancy complications, including premature birth.  Stillbirths and smaller newborn babies, birth defects, and genetic damage to sperm.  Early menopause.  Lower estrogen level for women.  Infertility.  Facial wrinkles.  Blindness.  Increased risk of broken bones (fractures).  Senile dementia.  Stomach ulcers and internal bleeding.  Delayed wound healing and increased risk of complications during surgery.  Even smoking lightly shortens your life expectancy  by several years. Because of secondhand smoke exposure, children of smokers have an increased risk of the following:  Sudden infant death syndrome (SIDS).  Respiratory infections.  Lung cancer.  Heart disease.  Ear infections. What are the benefits of quitting? There are many health benefits of quitting smoking. Here are some of them:  Within days of quitting smoking, your risk of having a heart attack decreases, your blood flow improves, and your lung capacity improves. Blood pressure, pulse rate, and breathing patterns start returning to normal soon after quitting.  Within months, your lungs may clear up completely.  Quitting for 10 years reduces your risk of developing lung cancer and heart disease to almost that of a nonsmoker.  People who quit may see an improvement in their overall quality of life. How do I quit smoking?     Smoking is an addiction with both physical and psychological effects, and longtime habits can be hard to change. Your health care provider can recommend:  Programs and community resources, which may include group support, education, or talk therapy.  Prescription medicines to help reduce cravings.  Nicotine replacement products, such as patches, gum, and nasal sprays. Use these products only as directed. Do not replace cigarette smoking with electronic cigarettes, which are commonly called e-cigarettes. The safety of e-cigarettes is not known, and some may contain harmful chemicals.  A combination of two or more of these methods. Where to find more information  American Lung Association: www.lung.org  American Cancer Society: www.cancer.org Summary  Smoking cigarettes is very bad for your health. Cigarette smokers have an increased risk of many serious medical problems, including several cancers, heart disease, and  stroke.  Smoking is an addiction with both physical and psychological effects, and longtime habits can be hard to change.  By  stopping right away, you can greatly reduce the risk of medical problems for you and your family.  To help you quit smoking, your health care provider can recommend programs, community resources, prescription medicines, and nicotine replacement products such as patches, gum, and nasal sprays. This information is not intended to replace advice given to you by your health care provider. Make sure you discuss any questions you have with your health care provider. Document Released: 10/21/2004 Document Revised: 12/15/2017 Document Reviewed: 09/17/2016 Elsevier Interactive Patient Education  2019 [employer].  Steps to Quit Smoking  Smoking tobacco can be bad for your health. It can also affect almost every organ in your body. Smoking puts you and people around you at risk for many serious long-lasting (chronic) diseases. Quitting smoking is hard, but it is one of the best things that you can do for your health. It is never too late to quit. What are the benefits of quitting smoking? When you quit smoking, you lower your risk for getting serious diseases and conditions. They can include:  Lung cancer or lung disease.  Heart disease.  Stroke.  Heart attack.  Not being able to have children (infertility).  Weak bones (osteoporosis) and broken bones (fractures). If you have coughing, wheezing, and shortness of breath, those symptoms may get better when you quit. You may also get sick less often. If you are pregnant, quitting smoking can help to lower your chances of having a baby of low birth weight. What can I do to help me quit smoking? Talk with your doctor about what can help you quit smoking. Some things you can do (strategies) include:  Quitting smoking totally, instead of slowly cutting back how much you smoke over a period of time.  Going to in-person counseling. You are more likely to quit if you go to many counseling sessions.  Using resources and support systems, such  as: ? Database administrator with a Social worker. ? Phone quitlines. ? Careers information officer. ? Support groups or group counseling. ? Text messaging programs. ? Mobile phone apps or applications.  Taking medicines. Some of these medicines may have nicotine in them. If you are pregnant or breastfeeding, do not take any medicines to quit smoking unless your doctor says it is okay. Talk with your doctor about counseling or other things that can help you. Talk with your doctor about using more than one strategy at the same time, such as taking medicines while you are also going to in-person counseling. This can help make quitting easier. What things can I do to make it easier to quit? Quitting smoking might feel very hard at first, but there is a lot that you can do to make it easier. Take these steps:  Talk to your family and friends. Ask them to support and encourage you.  Call phone quitlines, reach out to support groups, or work with a Social worker.  Ask people who smoke to not smoke around you.  Avoid places that make you want (trigger) to smoke, such as: ? Bars. ? Parties. ? Smoke-break areas at work.  Spend time with people who do not smoke.  Lower the stress in your life. Stress can make you want to smoke. Try these things to help your stress: ? Getting regular exercise. ? Deep-breathing exercises. ? Yoga. ? Meditating. ? Doing a body scan. To do this, close  your eyes, focus on one area of your body at a time from head to toe, and notice which parts of your body are tense. Try to relax the muscles in those areas.  Download or buy apps on your mobile phone or tablet that can help you stick to your quit plan. There are many free apps, such as QuitGuide from the [employer] Office manager for Disease Control and Prevention). You can find more support from smokefree.gov and other websites. This information is not intended to replace advice given to you by your health care provider. Make sure you discuss  any questions you have with your health care provider. Document Released: 07/10/2009 Document Revised: 05/11/2016 Document Reviewed: 01/28/2015 Elsevier Interactive Patient Education  2019 [employer].

## 2018-11-29 NOTE — Progress Notes (Signed)
BP 120/64   Pulse 85   Temp 97.7 F (36.5 C) (Oral)   Resp 14   Ht 5\' 8"  (1.727 m)   Wt 184 lb (83.5 kg)   SpO2 98%   BMI 27.98 kg/m    Subjective:    Patient ID: Scott Rosario, male    DOB: March 09, 1961, 58 y.o.   MRN: [id number]  HPI: Scott Rosario is a 58 y.o. male  Chief Complaint  Patient presents with  . Follow-up  . Medication Refill    HPI  He is here for f/u No travel or exposure to someone back from [place], [place], [place], [place], or [place]  Type 2 diabetes Did some doctoring on his toenails last night; ingrown, not infected; offered   Cirrhosis, seeing Dr. Allen Norris; missed his appointment, and will see him soon; he does have something on the left side, a pain in the abdomen 3-4 weeks ago, went all the way around from the left side to the right side and up the back; disappeared; Scott Rosario wanted him to get checked out but he wouldn't; he is feeling something comes and goes; made worse by BMs; no blood or mucous in the stool; knows to watch He was going to have a colonoscopy in November but couldn't get the medicine; had to be canceled, not been rescheduled he says; they just set up to have that redone this month or next; had liver US August 2019; last abd [place] was Jan 2018  He sees pulmonologist; last chest CT May 2018; still smoking 1 ppd; he has thought about quitting; he has cut back, pushing it away; close friend smoking like a freight train   Depression screen [facility] 2/9 11/29/2018 07/25/2018 07/13/2018 04/13/2018 12/20/2017  Decreased Interest 0 0 0 0 0  Down, Depressed, Hopeless 0 0 0 0 0  PHQ - 2 Score 0 0 0 0 0  Altered sleeping 0 - 0 - -  Tired, decreased energy 0 - 0 - -  Change in appetite 0 - 0 - -  Feeling bad or failure about yourself  0 - 0 - -  Trouble concentrating 0 - 0 - -  Moving slowly or fidgety/restless 0 - 0 - -  Suicidal thoughts 0 - 0 - -  PHQ-9 Score 0 - 0 - -  Difficult doing work/chores Not difficult at all - Not difficult at all - -   Fall  Risk  11/29/2018 07/25/2018 07/13/2018 04/24/2018 04/13/2018  Falls in the past year? 0 No No No No  Number falls in past yr: 0 - - - -  Injury with Fall? 0 - - - -    Relevant past medical, surgical, family and social history reviewed Past Medical History:  Diagnosis Date  . Alkaline phosphatase elevation   . Aortic atherosclerosis ([place])    on CT 02/25/14  . Asthma   . CAD (coronary artery disease)   . Cirrhosis ([place])   . COPD (chronic obstructive pulmonary disease) ([place])   . Diabetes mellitus without complication ([place])   . Emphysema of lung ([place])   . GERD (gastroesophageal reflux disease)   . Heart attack ([place])    2005, 2017 (stent after each)  . Hyperlipidemia   . Hypertension   . Pulmonary nodules    x 3, (46mm, 5mm, 54mm) chest CT February 25, 2014  . Renal cyst    on CT 02/25/14  . Tobacco use    Past Surgical History:  Procedure Laterality Date  .  APPENDECTOMY    . CARDIAC CATHETERIZATION Left 03/09/2016   Procedure: Left Heart Cath and Coronary Angiography;  Surgeon: Corey Skains, MD;  Location: [place] [facility];  Service: Cardiovascular;  Laterality: Left;  . CARDIAC CATHETERIZATION N/A 03/09/2016   Procedure: Coronary Stent Intervention;  Surgeon: Isaias Cowman, MD;  Location: [place] [facility];  Service: Cardiovascular;  Laterality: N/A;  . CORONARY ANGIOPLASTY WITH STENT PLACEMENT  10/25/2003   x 2  . ESOPHAGOGASTRODUODENOSCOPY (EGD) WITH PROPOFOL N/A 10/24/2017   Procedure: ESOPHAGOGASTRODUODENOSCOPY (EGD) WITH PROPOFOL;  Surgeon: Lucilla Lame, MD;  Location: [facility];  Service: Endoscopy;  Laterality: N/A;  Diabetic - oral meds   Family History  Problem Relation Age of Onset  . Diabetes Mother   . Hypertension Mother   . Arthritis Mother   . Hyperlipidemia Mother   . Heart attack Father   . Epilepsy Father   . Diabetes Father   . Seizures Father   . Heart disease Father   . Hyperlipidemia Father   . Hypertension Father   . Stroke Father     . Diabetes Brother   . Hyperlipidemia Brother   . Hypertension Brother   . Cancer Maternal Grandmother        unknown  . Diabetes Paternal Grandmother   . Heart disease Paternal Grandmother   . Hyperlipidemia Paternal Grandmother   . Hypertension Paternal Grandmother   . Heart attack Paternal Grandfather    Social History   Tobacco Use  . Smoking status: Current Every Day Smoker    Packs/day: 0.50    Years: 40.00    Pack years: 20.00    Types: Cigarettes    Last attempt to quit: 09/09/2017    Years since quitting: 1.2  . Smokeless tobacco: Never Used  . Tobacco comment: (10/18/17 - currently 6-10 cigs/day)  Substance Use Topics  . Alcohol use: No    Alcohol/week: 0.0 standard drinks  . Drug use: No     Office Visit from 11/29/2018 in [facility]  AUDIT-C Score  0      Interim medical history since last visit reviewed. Allergies and medications reviewed  Review of Systems Per HPI unless specifically indicated above     Objective:    BP 120/64   Pulse 85   Temp 97.7 F (36.5 C) (Oral)   Resp 14   Ht 5\' 8"  (1.727 m)   Wt 184 lb (83.5 kg)   SpO2 98%   BMI 27.98 kg/m   Wt Readings from Last 3 Encounters:  11/29/18 184 lb (83.5 kg)  07/25/18 190 lb 1.6 oz (86.2 kg)  07/13/18 191 lb 3.2 oz (86.7 kg)    Physical Exam Constitutional:      General: He is not in acute distress.    Appearance: He is well-developed.     Comments: overweight  HENT:     Head: Normocephalic and atraumatic.  Eyes:     General: No scleral icterus. Neck:     Thyroid: No thyromegaly.  Cardiovascular:     Rate and Rhythm: Normal rate and regular rhythm.  Pulmonary:     Effort: Pulmonary effort is normal.     Breath sounds: Normal breath sounds.  Abdominal:     General: Bowel sounds are normal. There is no distension.     Palpations: Abdomen is soft.  Skin:    General: Skin is warm and dry.     Coloration: Skin is not pale.     Comments:  Advanced ageing of  the skin consistent with years of cigarette smoking  Neurological:     Mental Status: He is alert.     Coordination: Coordination normal.  Psychiatric:        Mood and Affect: Mood is not anxious or depressed.        Behavior: Behavior normal.        Thought Content: Thought content normal.        Judgment: Judgment normal.    Diabetic Foot Form - Detailed   Diabetic Foot Exam - detailed Diabetic Foot exam was performed with the following findings:  Yes 11/29/2018  4:46 PM  Visual Foot Exam completed.:  Yes  Pulse Foot Exam completed.:  Yes  Right Dorsalis Pedis:  Present Left Dorsalis Pedis:  Present  Sensory Foot Exam Completed.:  Yes Semmes-Weinstein Monofilament Test R Site 1-Great Toe:  Pos L Site 1-Great Toe:  Pos    Comments:  The great toenails have been trimmed too short, and there is scant amount of open wound with no active bleeding, no proximal erythema, no swelling and no drainage; patient admits to have trimmed his nails too short last night       Assessment & Plan:   Problem List Items Addressed This Visit      Respiratory   Emphysema of lung ([place])     Digestive   Cirrhosis ([place]) (Chronic)   Relevant Orders   US Abdomen Complete     Endocrine   Diabetes mellitus without complication ([place]) - Primary (Chronic)   Relevant Orders   Hemoglobin A1C (Completed)   Lipid panel (Completed)   Hemoglobin A1c   COMPLETE METABOLIC PANEL WITH GFR (Completed)   Microalbumin / creatinine urine ratio (Completed)     Musculoskeletal and Integument   Ingrown toenail of both feet    Described risk of infection, avoiding drawing blood, cutting too short, how germs get in, risk of osteomyelitis and need for amputation; watch feet carefully, dressed and keep clean; BID; notify me right away if infection or go to UC or ER if weekend        Other   Alkaline phosphatase elevation   Relevant Orders   US Abdomen Complete   Tobacco use (Chronic)   Relevant Orders   CT CHEST  LUNG CA SCREEN LOW DOSE W/O CM   Medication monitoring encounter   Relevant Orders   COMPLETE METABOLIC PANEL WITH GFR (Completed)   Hyperlipidemia (Chronic)   Relevant Orders   Lipid panel (Completed)    Other Visit Diagnoses    Abdominal pain, unspecified abdominal location       Relevant Orders   US Abdomen Complete       Follow up plan: Return in about 3 months (around 03/01/2019) for follow-up visit with Dr. Sanda Klein.  An after-visit summary was printed and given to the patient at [facility].  Please see the patient instructions which may contain other information and recommendations beyond what is mentioned above in the assessment and plan.  No orders of the defined types were placed in this encounter.   Orders Placed This Encounter  Procedures  . CT CHEST LUNG CA SCREEN LOW DOSE W/O CM  . US Abdomen Complete  . Hemoglobin A1C  . Lipid panel  . Hemoglobin A1c  . COMPLETE METABOLIC PANEL WITH GFR  . Microalbumin / creatinine urine ratio

## 2018-11-29 NOTE — Assessment & Plan Note (Signed)
Described risk of infection, avoiding drawing blood, cutting too short, how germs get in, risk of osteomyelitis and need for amputation; watch feet carefully, dressed and keep clean; BID; notify me right away if infection or go to UC or ER if weekend

## 2018-11-30 LAB — COMPLETE METABOLIC PANEL WITH GFR
AG Ratio: 2.1 (calc) (ref 1.0–2.5)
ALBUMIN MSPROF: 4.4 g/dL (ref 3.6–5.1)
ALKALINE PHOSPHATASE (APISO): 132 U/L (ref 35–144)
ALT: 22 U/L (ref 9–46)
AST: 18 U/L (ref 10–35)
BILIRUBIN TOTAL: 1 mg/dL (ref 0.2–1.2)
BUN: 15 mg/dL (ref 7–25)
CHLORIDE: 104 mmol/L (ref 98–110)
CO2: 30 mmol/L (ref 20–32)
Calcium: 9.3 mg/dL (ref 8.6–10.3)
Creat: 0.81 mg/dL (ref 0.70–1.33)
GFR, EST AFRICAN AMERICAN: 113 mL/min/{1.73_m2} (ref >=60)
GFR, Est Non African American: 97 mL/min/{1.73_m2} (ref >=60)
Globulin: 2.1 g/dL (calc) (ref 1.9–3.7)
Glucose, Bld: 139 mg/dL — ABNORMAL HIGH (ref 65–99)
Potassium: 4.5 mmol/L (ref 3.5–5.3)
Sodium: 139 mmol/L (ref 135–146)
TOTAL PROTEIN: 6.5 g/dL (ref 6.1–8.1)

## 2018-11-30 LAB — MICROALBUMIN / CREATININE URINE RATIO
Creatinine, Urine: 237 mg/dL (ref 20–320)
MICROALB UR: 3.5 mg/dL
Microalb Creat Ratio: 15 mcg/mg creat (ref <30)

## 2018-11-30 LAB — LIPID PANEL
Cholesterol: 100 mg/dL (ref <200)
HDL: 28 mg/dL — AB (ref >=40)
LDL CHOLESTEROL (CALC): 54 mg/dL
NON-HDL CHOLESTEROL (CALC): 72 mg/dL (ref <130)
TRIGLYCERIDES: 94 mg/dL (ref <150)
Total CHOL/HDL Ratio: 3.6 (calc) (ref <5.0)

## 2018-11-30 LAB — HEMOGLOBIN A1C
HEMOGLOBIN A1C: 8.6 %{Hb} — AB (ref <5.7)
Mean Plasma Glucose: 200 (calc)
eAG (mmol/L): 11.1 (calc)

## 2018-12-01 ENCOUNTER — Telehealth: Payer: Self-pay | Admitting: *Deleted

## 2018-12-01 ENCOUNTER — Encounter: Payer: Self-pay | Admitting: *Deleted

## 2018-12-01 DIAGNOSIS — Z87891 Personal history of nicotine dependence: Secondary | ICD-10-CM

## 2018-12-01 DIAGNOSIS — Z122 Encounter for screening for malignant neoplasm of respiratory organs: Secondary | ICD-10-CM

## 2018-12-01 NOTE — Telephone Encounter (Signed)
Received referral for initial lung cancer screening scan. Contacted patient and obtained smoking history,(current, 67.5 pack year) as well as answering questions related to screening process. Patient denies signs of lung cancer such as weight loss or hemoptysis. Patient denies comorbidity that would prevent curative treatment if lung cancer were found. Patient is scheduled for shared decision making visit and CT scan on 12/19/18 at 1015am.

## 2018-12-06 ENCOUNTER — Ambulatory Visit
Admission: RE | Admit: 2018-12-06 | Discharge: 2018-12-06 | Disposition: A | Discharge status: Home / Self Care | Payer: BLUE CROSS/BLUE SHIELD | Source: Ambulatory Visit | Attending: Family Medicine | Admitting: Family Medicine

## 2018-12-06 ENCOUNTER — Other Ambulatory Visit: Payer: Self-pay

## 2018-12-06 DIAGNOSIS — R748 Abnormal levels of other serum enzymes: Secondary | ICD-10-CM | POA: Diagnosis present

## 2018-12-06 DIAGNOSIS — R109 Unspecified abdominal pain: Secondary | ICD-10-CM

## 2018-12-06 DIAGNOSIS — K7469 Other cirrhosis of liver: Secondary | ICD-10-CM

## 2018-12-10 ENCOUNTER — Encounter: Payer: Self-pay | Admitting: Family Medicine

## 2018-12-10 ENCOUNTER — Telehealth: Payer: Self-pay | Admitting: Family Medicine

## 2018-12-10 DIAGNOSIS — I714 Abdominal aortic aneurysm, without rupture, unspecified: Secondary | ICD-10-CM

## 2018-12-10 DIAGNOSIS — K8020 Calculus of gallbladder without cholecystitis without obstruction: Secondary | ICD-10-CM | POA: Insufficient documentation

## 2018-12-10 DIAGNOSIS — I719 Aortic aneurysm of unspecified site, without rupture: Secondary | ICD-10-CM

## 2018-12-10 DIAGNOSIS — E1165 Type 2 diabetes mellitus with hyperglycemia: Secondary | ICD-10-CM

## 2018-12-10 DIAGNOSIS — R1011 Right upper quadrant pain: Secondary | ICD-10-CM

## 2018-12-10 HISTORY — DX: Aortic aneurysm of unspecified site, without rupture: I71.9

## 2018-12-10 HISTORY — DX: Calculus of gallbladder without cholecystitis without obstruction: K80.20

## 2018-12-10 NOTE — Telephone Encounter (Signed)
I have [place] results to discuss with patient I left message on voicemail Will try him back tomorrow  Need to discuss AAA, 3.4 cm; will refer to vascular He has gallstone, discuss s/s that would necessitate ER visit, surgical consultation

## 2018-12-10 NOTE — Assessment & Plan Note (Signed)
Tried to reach pt by phone to discuss results

## 2018-12-10 NOTE — Assessment & Plan Note (Signed)
Tried to reach pt by phone to go over results

## 2018-12-11 NOTE — Telephone Encounter (Signed)
I discussed results Refer to surgeon for ruq pain, gallstones, reasons to go to ER reviewed Stay away from fatty food, explained mechanism/purpose of gallbladder Explained AAA, refer to vascular; smoking cessation urged We also reviewed recent labs; A1c out of control, referring to endo HDL low, assoc w/heart attacks; smoking cessation urged Normal liver enzymes; he sees Dr. Allen Norris next week

## 2018-12-14 ENCOUNTER — Telehealth: Payer: Self-pay | Admitting: *Deleted

## 2018-12-14 NOTE — Telephone Encounter (Signed)
Patient notified/message left to notify patient that due to current restrictions lung screening appointments are cancelled and patient will be contacted regarding rescheduling.  

## 2018-12-15 ENCOUNTER — Encounter: Payer: Self-pay | Admitting: *Deleted

## 2018-12-18 ENCOUNTER — Ambulatory Visit: Payer: BLUE CROSS/BLUE SHIELD | Admitting: Gastroenterology

## 2018-12-18 DIAGNOSIS — E1165 Type 2 diabetes mellitus with hyperglycemia: Secondary | ICD-10-CM | POA: Insufficient documentation

## 2018-12-18 DIAGNOSIS — F17200 Nicotine dependence, unspecified, uncomplicated: Secondary | ICD-10-CM | POA: Insufficient documentation

## 2018-12-19 ENCOUNTER — Inpatient Hospital Stay: Payer: BLUE CROSS/BLUE SHIELD | Admitting: Oncology

## 2018-12-19 ENCOUNTER — Ambulatory Visit: Payer: BLUE CROSS/BLUE SHIELD

## 2018-12-21 ENCOUNTER — Other Ambulatory Visit: Payer: Self-pay

## 2018-12-21 ENCOUNTER — Ambulatory Visit (INDEPENDENT_AMBULATORY_CARE_PROVIDER_SITE_OTHER): Payer: Self-pay | Admitting: Nurse Practitioner

## 2018-12-21 ENCOUNTER — Encounter: Payer: Self-pay | Admitting: Nurse Practitioner

## 2018-12-21 DIAGNOSIS — I10 Essential (primary) hypertension: Secondary | ICD-10-CM

## 2018-12-21 DIAGNOSIS — R6889 Other general symptoms and signs: Secondary | ICD-10-CM

## 2018-12-21 DIAGNOSIS — I700 Atherosclerosis of aorta: Secondary | ICD-10-CM

## 2018-12-21 DIAGNOSIS — R51 Headache: Secondary | ICD-10-CM

## 2018-12-21 DIAGNOSIS — E119 Type 2 diabetes mellitus without complications: Secondary | ICD-10-CM

## 2018-12-21 DIAGNOSIS — J439 Emphysema, unspecified: Secondary | ICD-10-CM

## 2018-12-21 DIAGNOSIS — E782 Mixed hyperlipidemia: Secondary | ICD-10-CM

## 2018-12-21 DIAGNOSIS — R509 Fever, unspecified: Secondary | ICD-10-CM

## 2018-12-21 DIAGNOSIS — K7469 Other cirrhosis of liver: Secondary | ICD-10-CM

## 2018-12-21 DIAGNOSIS — R197 Diarrhea, unspecified: Secondary | ICD-10-CM

## 2018-12-21 DIAGNOSIS — K219 Gastro-esophageal reflux disease without esophagitis: Secondary | ICD-10-CM

## 2018-12-21 DIAGNOSIS — Z20818 Contact with and (suspected) exposure to other bacterial communicable diseases: Secondary | ICD-10-CM

## 2018-12-21 MED ORDER — LOSARTAN POTASSIUM 50 MG PO TABS: 50.0000 mg | ORAL_TABLET | Freq: Every day | ORAL | 2 refills | Status: DC

## 2018-12-21 MED ORDER — ATORVASTATIN CALCIUM 80 MG PO TABS: 80.0000 mg | ORAL_TABLET | Freq: Every day | ORAL | 2 refills | Status: DC

## 2018-12-21 MED ORDER — OSELTAMIVIR PHOSPHATE 75 MG PO CAPS: 75.0000 mg | ORAL_CAPSULE | Freq: Two times a day (BID) | ORAL | 0 refills | Status: DC

## 2018-12-21 MED ORDER — METOPROLOL SUCCINATE ER 25 MG PO TB24: 25.0000 mg | ORAL_TABLET | Freq: Every day | ORAL | 2 refills | Status: DC

## 2018-12-21 MED ORDER — PANTOPRAZOLE SODIUM 40 MG PO TBEC: 40.0000 mg | DELAYED_RELEASE_TABLET | Freq: Two times a day (BID) | ORAL | 2 refills | Status: DC

## 2018-12-21 MED ORDER — ALBUTEROL SULFATE HFA 108 (90 BASE) MCG/ACT IN AERS: 2.0000 | INHALATION_SPRAY | RESPIRATORY_TRACT | 1 refills | Status: AC | PRN

## 2018-12-21 NOTE — Progress Notes (Signed)
Virtual Visit via Telephone Note  I connected with Scott Rosario on 12/21/18 at 11:20 AM EDT by telephone and verified that I am speaking with the correct person using two identifiers.   Staff  discussed the limitations, risks, security and privacy concerns of performing an evaluation and management service by telephone and the availability of in person appointments. The patient expressed understanding and agreed to proceed.   History of Present Illness:  Patient endorses diarrhea, cold chills, body aches, headaches, sneezing started yesterday. States something similar has been around the office- last week. No known [facility] contacts. States has not taken anything OTC. Denies fever, shortness of breath, cough, chest pain, rhinorrhea, watery eyes.   Hypertension Takes losartan 50mg , metoprolol 25mg   Denies blurry vision, lightheadedness BP Readings from Last 3 Encounters:  11/29/18 120/64  07/25/18 120/70  07/13/18 138/70   COPD Takes albuterol PRN, smokes 1ppd, working on cutting back  Has needed it a few times this past week due to allergies   GERD Takes protonix twice a day, states whenever he does not take it for a few doses has severe indigestion. Sees GI for cirrhosis   Diabetes Mellitus  Sees endocrinology.  Increased metforming to 2000 mg daily, glipizide 10mg  daily, continue lantus at 14 units every evening and was started on sliding scale Novolog before meals. Blood sugars have been improving. Fasting 97-133  Hyperlipidemia Takes atorvastatin as prescribed.   Lab Results  Component Value Date   CHOL 100 11/29/2018   HDL 28 (L) 11/29/2018   LDLCALC 54 11/29/2018   TRIG 94 11/29/2018   CHOLHDL 3.6 11/29/2018    Observations/Objective: Pleasant, alert and oriented Speaking full sentences without difficulty No hoarseness or cough   Assessment and Plan: 1. Essential hypertension stable - losartan (COZAAR) 50 MG tablet; Take 1 tablet (50 mg total) by mouth  daily.  Dispense: 30 tablet; Refill: 2 - metoprolol succinate (TOPROL-XL) 25 MG 24 hr tablet; Take 1 tablet (25 mg total) by mouth daily.  Dispense: 30 tablet; Refill: 2  2. Diabetes mellitus without complication (HCC) Improving, followup with endo  3. Gastroesophageal reflux disease without esophagitis Discussed skipping second dose every other day, diet.  - pantoprazole (PROTONIX) 40 MG tablet; Take 1 tablet (40 mg total) by mouth 2 (two) times daily.  Dispense: 60 tablet; Refill: 2  4. Pulmonary emphysema, unspecified emphysema type ([place]) Cut down smoking - albuterol (PROVENTIL HFA;VENTOLIN HFA) 108 (90 Base) MCG/ACT inhaler; Inhale 2 puffs into the lungs every 4 (four) hours as needed for wheezing or shortness of breath.  Dispense: 1 Inhaler; Refill: 1  5. Mixed hyperlipidemia Discussed increasing HDL - atorvastatin (LIPITOR) 80 MG tablet; Take 1 tablet (80 mg total) by mouth at bedtime.  Dispense: 30 tablet; Refill: 2  6. Flu-like symptoms Dicussed OTC management, Red flags- recommended self-quarantine  - oseltamivir (TAMIFLU) 75 MG capsule; Take 1 capsule (75 mg total) by mouth 2 (two) times daily.  Dispense: 10 capsule; Refill: 0  7. Other cirrhosis of liver ([place]) Continue meds Follow up with GI  8. Aortic atherosclerosis (HCC) BP and statin     Follow Up Instructions:  follow up in 3 months   I discussed the assessment and treatment plan with the patient. The patient was provided an opportunity to ask questions and all were answered. The patient agreed with the plan and demonstrated an understanding of the instructions.   The patient was advised to call back or seek an in-person evaluation if the symptoms worsen  or if the condition fails to improve as anticipated.  I provided 16 minutes of non-face-to-face time during this encounter.   Fredderick Severance, NP

## 2018-12-25 ENCOUNTER — Other Ambulatory Visit: Payer: Self-pay | Admitting: Family Medicine

## 2018-12-25 DIAGNOSIS — E119 Type 2 diabetes mellitus without complications: Secondary | ICD-10-CM

## 2019-01-17 ENCOUNTER — Telehealth: Payer: Self-pay | Admitting: Family Medicine

## 2019-01-17 ENCOUNTER — Emergency Department: Payer: BLUE CROSS/BLUE SHIELD

## 2019-01-17 ENCOUNTER — Encounter: Payer: Self-pay | Admitting: Family Medicine

## 2019-01-17 ENCOUNTER — Other Ambulatory Visit
Admission: RE | Admit: 2019-01-17 | Discharge: 2019-01-17 | Disposition: A | Discharge status: Home / Self Care | Payer: BLUE CROSS/BLUE SHIELD | Source: Ambulatory Visit | Attending: Family Medicine | Admitting: Family Medicine

## 2019-01-17 ENCOUNTER — Emergency Department
Admission: EM | Admit: 2019-01-17 | Discharge: 2019-01-18 | Disposition: A | Discharge status: Home / Self Care | Payer: BLUE CROSS/BLUE SHIELD | Attending: Student in an Organized Health Care Education/Training Program | Admitting: Student in an Organized Health Care Education/Training Program

## 2019-01-17 ENCOUNTER — Other Ambulatory Visit: Payer: Self-pay

## 2019-01-17 ENCOUNTER — Ambulatory Visit (INDEPENDENT_AMBULATORY_CARE_PROVIDER_SITE_OTHER): Payer: Self-pay | Admitting: Family Medicine

## 2019-01-17 ENCOUNTER — Other Ambulatory Visit: Payer: Self-pay | Admitting: Family Medicine

## 2019-01-17 VITALS — BP 138/72 | HR 87 | Wt 187.0 lb

## 2019-01-17 DIAGNOSIS — J45909 Unspecified asthma, uncomplicated: Secondary | ICD-10-CM | POA: Diagnosis not present

## 2019-01-17 DIAGNOSIS — F17210 Nicotine dependence, cigarettes, uncomplicated: Secondary | ICD-10-CM | POA: Diagnosis not present

## 2019-01-17 DIAGNOSIS — I2510 Atherosclerotic heart disease of native coronary artery without angina pectoris: Secondary | ICD-10-CM | POA: Insufficient documentation

## 2019-01-17 DIAGNOSIS — Z79899 Other long term (current) drug therapy: Secondary | ICD-10-CM | POA: Diagnosis not present

## 2019-01-17 DIAGNOSIS — R1011 Right upper quadrant pain: Secondary | ICD-10-CM | POA: Insufficient documentation

## 2019-01-17 DIAGNOSIS — E119 Type 2 diabetes mellitus without complications: Secondary | ICD-10-CM | POA: Insufficient documentation

## 2019-01-17 DIAGNOSIS — I252 Old myocardial infarction: Secondary | ICD-10-CM | POA: Diagnosis not present

## 2019-01-17 DIAGNOSIS — Z7902 Long term (current) use of antithrombotics/antiplatelets: Secondary | ICD-10-CM | POA: Diagnosis not present

## 2019-01-17 DIAGNOSIS — J449 Chronic obstructive pulmonary disease, unspecified: Secondary | ICD-10-CM | POA: Insufficient documentation

## 2019-01-17 DIAGNOSIS — I10 Essential (primary) hypertension: Secondary | ICD-10-CM | POA: Diagnosis not present

## 2019-01-17 DIAGNOSIS — R197 Diarrhea, unspecified: Secondary | ICD-10-CM

## 2019-01-17 DIAGNOSIS — R112 Nausea with vomiting, unspecified: Secondary | ICD-10-CM

## 2019-01-17 DIAGNOSIS — K8000 Calculus of gallbladder with acute cholecystitis without obstruction: Secondary | ICD-10-CM

## 2019-01-17 DIAGNOSIS — Z794 Long term (current) use of insulin: Secondary | ICD-10-CM | POA: Diagnosis not present

## 2019-01-17 DIAGNOSIS — K8020 Calculus of gallbladder without cholecystitis without obstruction: Secondary | ICD-10-CM

## 2019-01-17 DIAGNOSIS — R110 Nausea: Secondary | ICD-10-CM

## 2019-01-17 DIAGNOSIS — E1165 Type 2 diabetes mellitus with hyperglycemia: Secondary | ICD-10-CM

## 2019-01-17 LAB — COMPREHENSIVE METABOLIC PANEL
ALT: 19 U/L (ref 0–44)
ALT: 19 U/L (ref 0–44)
AST: 16 U/L (ref 15–41)
AST: 16 U/L (ref 15–41)
Albumin: 4.1 g/dL (ref 3.5–5.0)
Albumin: 4.5 g/dL (ref 3.5–5.0)
Alkaline Phosphatase: 138 U/L — ABNORMAL HIGH (ref 38–126)
Alkaline Phosphatase: 142 U/L — ABNORMAL HIGH (ref 38–126)
Anion gap: 7 (ref 5–15)
Anion gap: 9 (ref 5–15)
BUN: 14 mg/dL (ref 6–20)
BUN: 16 mg/dL (ref 6–20)
CO2: 26 mmol/L (ref 22–32)
CO2: 28 mmol/L (ref 22–32)
Calcium: 8.8 mg/dL — ABNORMAL LOW (ref 8.9–10.3)
Calcium: 9 mg/dL (ref 8.9–10.3)
Chloride: 104 mmol/L (ref 98–111)
Chloride: 101 mmol/L (ref 98–111)
Creatinine, Ser: 0.7 mg/dL (ref 0.61–1.24)
Creatinine, Ser: 0.7 mg/dL (ref 0.61–1.24)
GFR calc Af Amer: >60 mL/min (ref >60)
GFR calc Af Amer: >60 mL/min (ref >60)
GFR calc non Af Amer: >60 mL/min (ref >60)
GFR calc non Af Amer: >60 mL/min (ref >60)
Glucose, Bld: 125 mg/dL — ABNORMAL HIGH (ref 70–99)
Glucose, Bld: 177 mg/dL — ABNORMAL HIGH (ref 70–99)
Potassium: 4.2 mmol/L (ref 3.5–5.1)
Potassium: 3.6 mmol/L (ref 3.5–5.1)
Sodium: 137 mmol/L (ref 135–145)
Sodium: 138 mmol/L (ref 135–145)
Total Bilirubin: 1.1 mg/dL (ref 0.3–1.2)
Total Bilirubin: 1 mg/dL (ref 0.3–1.2)
Total Protein: 6.8 g/dL (ref 6.5–8.1)
Total Protein: 7.1 g/dL (ref 6.5–8.1)

## 2019-01-17 LAB — CBC WITH DIFFERENTIAL/PLATELET
Abs Immature Granulocytes: 0.04 10*3/uL (ref 0.00–0.07)
Basophils Absolute: 0.1 10*3/uL (ref 0.0–0.1)
Basophils Relative: 1 %
Eosinophils Absolute: 0.3 10*3/uL (ref 0.0–0.5)
Eosinophils Relative: 2 %
HCT: 42.3 % (ref 39.0–52.0)
Hemoglobin: 14.6 g/dL (ref 13.0–17.0)
Immature Granulocytes: 0 %
Lymphocytes Relative: 22 %
Lymphs Abs: 2.7 10*3/uL (ref 0.7–4.0)
MCH: 29.9 pg (ref 26.0–34.0)
MCHC: 34.5 g/dL (ref 30.0–36.0)
MCV: 86.5 fL (ref 80.0–100.0)
Monocytes Absolute: 1.2 10*3/uL — ABNORMAL HIGH (ref 0.1–1.0)
Monocytes Relative: 9 %
Neutro Abs: 8.4 10*3/uL — ABNORMAL HIGH (ref 1.7–7.7)
Neutrophils Relative %: 66 %
Platelets: 232 10*3/uL (ref 150–400)
RBC: 4.89 MIL/uL (ref 4.22–5.81)
RDW: 13.2 % (ref 11.5–15.5)
WBC: 12.7 10*3/uL — ABNORMAL HIGH (ref 4.0–10.5)
nRBC: 0 % (ref 0.0–0.2)

## 2019-01-17 LAB — LIPASE, BLOOD
Lipase: 46 U/L (ref 11–51)
Lipase: 42 U/L (ref 11–51)

## 2019-01-17 LAB — MAGNESIUM: Magnesium: 1.4 mg/dL — ABNORMAL LOW (ref 1.7–2.4)

## 2019-01-17 MED ORDER — PROMETHAZINE HCL 25 MG/ML IJ SOLN: 12.5000 mg | Freq: Four times a day (QID) | INTRAMUSCULAR | Status: DC | PRN

## 2019-01-17 MED ORDER — MORPHINE SULFATE (PF) 4 MG/ML IV SOLN: 4.0000 mg | INTRAVENOUS | Status: DC | PRN

## 2019-01-17 NOTE — Telephone Encounter (Signed)
STAT lab orders given to phlebotomist by MD

## 2019-01-17 NOTE — Telephone Encounter (Signed)
Roselyn Reef, please make sure the labs for Mr. Ansell are the ones marked "STAT" There were two sets of lab orders, and I changed them to "STAT" Please shred the first set and make sure the STAT ones are used today Thank you  Please also tell the patient that surgery is able to see him this week if needed, so please have him CALL them for an appointment; they are right across the hall if he wants to go there after having labs drawn

## 2019-01-17 NOTE — Assessment & Plan Note (Signed)
Started shortly after increase in metformin (he went from 500 mg BID to 1000 mg BID); STOP metformin for now until we get this figured out, will notify endo, patient to monitor sugars closely in meantime; will get labs including stool studies; check Mg2+ and K+ with frequent stooling; to ER if worsening

## 2019-01-17 NOTE — ED Notes (Signed)
Pt sent over from [facility] for rt upper abd pain, elevated WBC and confirmed 16mm gallstone

## 2019-01-17 NOTE — Telephone Encounter (Signed)
Your office note faxed and this documentation

## 2019-01-17 NOTE — Assessment & Plan Note (Signed)
Will stop metformin for now given symptoms of nausea and diarrhea after doubling dose; patient to check FSBS 4x a day as he has been directed by endocrinologist; I will send copy of this note to endocrinology to let her know of my recommendation; not wanting to step on toes, but with frequent stooling, could be dehydrated, don't want him to become acidotic; hopefully, this will be short-term cessation

## 2019-01-17 NOTE — Telephone Encounter (Signed)
I spoke with surgeon; reviewed case; he recommended that he go to the ER to get [place] The ER doctor will evaluate and contact surgeon; may be admitted and have surgery tomorrow or may be sent home with pain med after consultation  I called the patient back, explained our discussion Urged him to go to the ER now I will call [facility] to give heads up He agrees  I called [place] ER; check out given to [facility]

## 2019-01-17 NOTE — Progress Notes (Signed)
BP 138/72   Pulse 87   Wt 187 lb (84.8 kg)   BMI 28.43 kg/m    Subjective:    Patient ID: Scott Rosario, male    DOB: [birth year], 58 y.o.   MRN: [id number]  HPI: Scott Rosario is a 58 y.o. male  Chief Complaint  Patient presents with  . Abdominal Pain    nausea with diarrhea, feels better    HPI Virtual Visit via Telephone/Video Note   I connected with the patient via:   I verified that I am speaking with the correct person using two identifiers.  Call started: 12:26 pm Call terminated: 12:46 pm Total length of call: 20 minutes   I discussed the limitations, risks, and privacy concerns of performing an evaluation and management service by telephone and the availability of in-person appointments. I explained that he/she may be responsible for charges related to this service. The patient expressed understanding and agreed to proceed.  Provider location: home, upstairs office with door closed, earphones/headset on Patient location: car; parked Additional participants: no one  Chart reviewed prior to visit; he was seen by NP here on 12/21/2018, diarrhea, cold chills He also saw endocrinologist for his diabetes; metformin was increased  Patient has diarrhea and nausea He is trying to figure out if it's from medicine Lots of stomach cramps and diarrhea Then this week it started again 2.5 days and the pain has eased off, but still taking the medicine NP [place] increased one of his medicine from 500 mg BID to 1000 mg These symptoms started about a week after the medicine change No blood in the stool, no mucous Stool looked kind of green, the liquid part i.e.; the solid part was brown He was going (stooling) a lot; 6x yesterday; 2x today so far; Monday he went 5x; even at night, it was waking him up at night No travel No raw seafood, sushi Water source is "diet Mountain Dew" he jokes; water is well water; not checked in a while; good well; no one else at home  has diarrhea No sick pets; "dog is healthy as a horse" No fever, nothing at all RUQ discomfort; mild; maybe a 4 out of 10; he still has his gallbladder; in fact, he had an [place] recently which showed a 7 mm gallstone and he was referred to surgery; he never did see the surgeon; he was referred to surgery but he did not return their calls; a letter was sent as well I explained per the chart; I gave him the number during this office visit The pain in the RUQ is worse after eating No unusual odor or taste in the mouth He has had some nausea but no vomiting, 3 days this week and 2 days last week Feels like acid reflux, more like that; nothing comes up  Depression screen [facility] 2/9 12/21/2018 11/29/2018 07/25/2018 07/13/2018 04/13/2018  Decreased Interest 0 0 0 0 0  Down, Depressed, Hopeless 0 0 0 0 0  PHQ - 2 Score 0 0 0 0 0  Altered sleeping 0 0 - 0 -  Tired, decreased energy 0 0 - 0 -  Change in appetite 0 0 - 0 -  Feeling bad or failure about yourself  0 0 - 0 -  Trouble concentrating 0 0 - 0 -  Moving slowly or fidgety/restless 0 0 - 0 -  Suicidal thoughts 0 0 - 0 -  PHQ-9 Score 0 0 - 0 -  Difficult doing work/chores Not  difficult at all Not difficult at all - Not difficult at all -   Fall Risk  01/17/2019 12/21/2018 11/29/2018 07/25/2018 07/13/2018  Falls in the past year? 0 0 0 No No  Number falls in past yr: 0 - 0 - -  Injury with Fall? 0 - 0 - -    Relevant past medical, surgical, family and social history reviewed Past Medical History:  Diagnosis Date  . Alkaline phosphatase elevation   . Aortic aneurysm ([place]) 12/10/2018   3.4 cm March 2020  . Aortic atherosclerosis ([place])    on CT 02/25/14  . Asthma   . CAD (coronary artery disease)   . Cirrhosis ([place])   . COPD (chronic obstructive pulmonary disease) ([place])   . Diabetes mellitus without complication ([place])   . Emphysema of lung ([place])   . GERD (gastroesophageal reflux disease)   . Heart attack ([place])    2005, 2017 (stent after each)  .  Hyperlipidemia   . Hypertension   . Pulmonary nodules    x 3, (79mm, 6mm, 63mm) chest CT February 25, 2014  . Renal cyst    on CT 02/25/14  . Tobacco use    Past Surgical History:  Procedure Laterality Date  . APPENDECTOMY    . CARDIAC CATHETERIZATION Left 03/09/2016   Procedure: Left Heart Cath and Coronary Angiography;  Surgeon: Corey Skains, MD;  Location: [facility];  Service: Cardiovascular;  Laterality: Left;  . CARDIAC CATHETERIZATION N/A 03/09/2016   Procedure: Coronary Stent Intervention;  Surgeon: Isaias Cowman, MD;  Location: [facility];  Service: Cardiovascular;  Laterality: N/A;  . CORONARY ANGIOPLASTY WITH STENT PLACEMENT  10/25/2003   x 2  . ESOPHAGOGASTRODUODENOSCOPY (EGD) WITH PROPOFOL N/A 10/24/2017   Procedure: ESOPHAGOGASTRODUODENOSCOPY (EGD) WITH PROPOFOL;  Surgeon: Lucilla Lame, MD;  Location: [facility];  Service: Endoscopy;  Laterality: N/A;  Diabetic - oral meds   Family History  Problem Relation Age of Onset  . Diabetes Mother   . Hypertension Mother   . Arthritis Mother   . Hyperlipidemia Mother   . Heart attack Father   . Epilepsy Father   . Diabetes Father   . Seizures Father   . Heart disease Father   . Hyperlipidemia Father   . Hypertension Father   . Stroke Father   . Diabetes Brother   . Hyperlipidemia Brother   . Hypertension Brother   . Cancer Maternal Grandmother        unknown  . Diabetes Paternal Grandmother   . Heart disease Paternal Grandmother   . Hyperlipidemia Paternal Grandmother   . Hypertension Paternal Grandmother   . Heart attack Paternal Grandfather    Social History   Tobacco Use  . Smoking status: Current Every Day Smoker    Packs/day: 1.50    Years: 45.00    Pack years: 67.50    Types: Cigarettes  . Smokeless tobacco: Never Used  . Tobacco comment: (10/18/17 - currently 6-10 cigs/day)  Substance Use Topics  . Alcohol use: No    Alcohol/week: 0.0 standard drinks  . Drug use: No      Office Visit from 01/17/2019 in [facility]  AUDIT-C Score  0     Interim medical history since last visit reviewed. Allergies and medications reviewed  Review of Systems Per HPI unless specifically indicated above     Objective:    BP 138/72   Pulse 87   Wt 187 lb (84.8 kg)   BMI  28.43 kg/m   Wt Readings from Last 3 Encounters:  01/17/19 187 lb (84.8 kg)  11/29/18 184 lb (83.5 kg)  07/25/18 190 lb 1.6 oz (86.2 kg)    Physical Exam Constitutional:      General: He is not in acute distress. Eyes:     General: No scleral icterus. Pulmonary:     Effort: No tachypnea or respiratory distress.  Abdominal:     Tenderness: There is abdominal tenderness in the right upper quadrant. There is no rebound.  Skin:    Coloration: Skin is not jaundiced or pale.  Neurological:     Mental Status: He is alert.  Psychiatric:        Mood and Affect: Mood is not anxious or depressed.        Speech: Speech is not rapid and pressured, delayed or slurred.        Behavior: Behavior normal.        Cognition and Memory: Cognition normal.   I had patient press on his own abdomen, and he reported mild RUQ tenderness; he tried testing for rebound tenderness and did NOT report any rebound tenderness  Results for orders placed or performed in visit on 11/29/18  Hemoglobin A1C  Result Value Ref Range   Hgb A1c MFr Bld 8.6 (H) <5.7 % of total Hgb   Mean Plasma Glucose 200 (calc)   eAG (mmol/L) 11.1 (calc)  Lipid panel  Result Value Ref Range   Cholesterol 100 <200 mg/dL   HDL 28 (L) > OR = 40 mg/dL   Triglycerides 94 <150 mg/dL   LDL Cholesterol (Calc) 54 mg/dL (calc)   Total CHOL/HDL Ratio 3.6 <5.0 (calc)   Non-HDL Cholesterol (Calc) 72 <130 mg/dL (calc)  COMPLETE METABOLIC PANEL WITH GFR  Result Value Ref Range   Glucose, Bld 139 (H) 65 - 99 mg/dL   BUN 15 7 - 25 mg/dL   Creat 0.81 0.70 - 1.33 mg/dL   GFR, Est Non African American 97 > OR = 60 mL/min/1.16m2   GFR,  Est African American 113 > OR = 60 mL/min/1.99m2   BUN/Creatinine Ratio NOT APPLICABLE 6 - 22 (calc)   Sodium 139 135 - 146 mmol/L   Potassium 4.5 3.5 - 5.3 mmol/L   Chloride 104 98 - 110 mmol/L   CO2 30 20 - 32 mmol/L   Calcium 9.3 8.6 - 10.3 mg/dL   Total Protein 6.5 6.1 - 8.1 g/dL   Albumin 4.4 3.6 - 5.1 g/dL   Globulin 2.1 1.9 - 3.7 g/dL (calc)   AG Ratio 2.1 1.0 - 2.5 (calc)   Total Bilirubin 1.0 0.2 - 1.2 mg/dL   Alkaline phosphatase (APISO) 132 35 - 144 U/L   AST 18 10 - 35 U/L   ALT 22 9 - 46 U/L  Microalbumin / creatinine urine ratio  Result Value Ref Range   Creatinine, Urine 237 20 - 320 mg/dL   Microalb, Ur 3.5 mg/dL   Microalb Creat Ratio 15 <30 mcg/mg creat      Assessment & Plan:   Problem List Items Addressed This Visit      Digestive   Gallstone    Will need to get CBC with diff, not sure if causing symptoms or just incidental finding and symptoms are otherwise caused by gastroenteritis, metformin increase, etc.        Endocrine   Diabetes type 2, uncontrolled ([place])    Will stop metformin for now given symptoms of nausea and diarrhea after doubling  dose; patient to check FSBS 4x a day as he has been directed by endocrinologist; I will send copy of this note to endocrinology to let her know of my recommendation; not wanting to step on toes, but with frequent stooling, could be dehydrated, don't want him to become acidotic; hopefully, this will be short-term cessation        Other   Diarrhea - Primary    Started shortly after increase in metformin (he went from 500 mg BID to 1000 mg BID); STOP metformin for now until we get this figured out, will notify endo, patient to monitor sugars closely in meantime; will get labs including stool studies; check Mg2+ and K+ with frequent stooling; to ER if worsening      Relevant Orders   CBC with Differential/Platelet   COMPLETE METABOLIC PANEL WITH GFR   Gastrointestinal Pathogen Panel PCR   Magnesium   Lipase     Other Visit Diagnoses    Nauseated       stop metformin for now until we get this figured out; notify surgeon, GI, endo; bringing him in for labs, to ER if worse; avoid fats   RUQ pain       Relevant Orders   CBC with Differential/Platelet   COMPLETE METABOLIC PANEL WITH GFR   Lipase      Follow up plan: No follow-ups on file.  No orders of the defined types were placed in this encounter.  Orders Placed This Encounter  Procedures  . CBC with Differential/Platelet  . COMPLETE METABOLIC PANEL WITH GFR  . Gastrointestinal Pathogen Panel PCR  . Magnesium  . Lipase   Cc: Warnell Forester, NP, endocrinology  Face-to-face time over video with patient plus consultation with colleagues was more than 25 minutes, >50% time spent counseling and coordination of care

## 2019-01-17 NOTE — ED Notes (Signed)
Pt resting on stretcher after returning from ultrasound. Alert and talkative at this time. Asking for something to drink. Encouraged to wait seen by MD. No distress noted. States his pain is "not bad".

## 2019-01-17 NOTE — Telephone Encounter (Signed)
PT NOTIFIED  

## 2019-01-17 NOTE — Assessment & Plan Note (Signed)
Will need to get CBC with diff, not sure if causing symptoms or just incidental finding and symptoms are otherwise caused by gastroenteritis, metformin increase, etc.

## 2019-01-17 NOTE — ED Provider Notes (Signed)
[facility] Emergency Department Provider Note    First MD Initiated Contact with Patient 01/17/19 2207     (approximate)  I have reviewed the triage vital signs and the nursing notes.   HISTORY  Chief Complaint Abdominal Pain    HPI Scott Rosario is a 58 y.o. male presents the ER for evaluation of nausea vomiting and right upper quadrant abdominal pain.  Symptoms have been on and off for the past week or 2 but became acutely worse today.  Followed with his PCP who did blood work showed that there was increasing leukocytosis but LFTs and lipase and bilirubin are normal.  Should he does have a history of cholelithiasis based on his symptoms was directed to the ER for further evaluation.  Currently denies any nausea but does have moderate pain when you palpate on his right upper quadrant.  is on ASA and plavix.   Past Medical History:  Diagnosis Date  . Alkaline phosphatase elevation   . Aortic aneurysm ([place]) 12/10/2018   3.4 cm March 2020  . Aortic atherosclerosis ([place])    on CT 02/25/14  . Asthma   . CAD (coronary artery disease)   . Cirrhosis ([place])   . COPD (chronic obstructive pulmonary disease) ([place])   . Diabetes mellitus without complication ([place])   . Emphysema of lung ([place])   . GERD (gastroesophageal reflux disease)   . Heart attack ([place])    2005, 2017 (stent after each)  . Hyperlipidemia   . Hypertension   . Pulmonary nodules    x 3, (16mm, 93mm, 40mm) chest CT February 25, 2014  . Renal cyst    on CT 02/25/14  . Tobacco use    Family History  Problem Relation Age of Onset  . Diabetes Mother   . Hypertension Mother   . Arthritis Mother   . Hyperlipidemia Mother   . Heart attack Father   . Epilepsy Father   . Diabetes Father   . Seizures Father   . Heart disease Father   . Hyperlipidemia Father   . Hypertension Father   . Stroke Father   . Diabetes Brother   . Hyperlipidemia Brother   . Hypertension Brother   . Cancer Maternal  Grandmother        unknown  . Diabetes Paternal Grandmother   . Heart disease Paternal Grandmother   . Hyperlipidemia Paternal Grandmother   . Hypertension Paternal Grandmother   . Heart attack Paternal Grandfather    Past Surgical History:  Procedure Laterality Date  . APPENDECTOMY    . CARDIAC CATHETERIZATION Left 03/09/2016   Procedure: Left Heart Cath and Coronary Angiography;  Surgeon: Corey Skains, MD;  Location: [facility];  Service: Cardiovascular;  Laterality: Left;  . CARDIAC CATHETERIZATION N/A 03/09/2016   Procedure: Coronary Stent Intervention;  Surgeon: Isaias Cowman, MD;  Location: [facility];  Service: Cardiovascular;  Laterality: N/A;  . CORONARY ANGIOPLASTY WITH STENT PLACEMENT  10/25/2003   x 2  . ESOPHAGOGASTRODUODENOSCOPY (EGD) WITH PROPOFOL N/A 10/24/2017   Procedure: ESOPHAGOGASTRODUODENOSCOPY (EGD) WITH PROPOFOL;  Surgeon: Lucilla Lame, MD;  Location: [facility];  Service: Endoscopy;  Laterality: N/A;  Diabetic - oral meds   Patient Active Problem List   Diagnosis Date Noted  . Aortic aneurysm ([place]) 12/10/2018  . Gallstone 12/10/2018  . Ingrown toenail of both feet 11/29/2018  . History of MI (myocardial infarction) 07/25/2018  . Senile purpura ([place]) 07/25/2018  . Overweight (BMI 25.0-29.9)  12/20/2017  . Gastritis without bleeding   . Diarrhea 10/18/2017  . Acquired trigger finger 02/15/2017  . Snoring 05/07/2016  . Medication monitoring encounter 08/19/2015  . COPD (chronic obstructive pulmonary disease) ([place])   . Emphysema of lung ([place])   . Diabetes type 2, uncontrolled ([place])   . GERD (gastroesophageal reflux disease)   . Hyperlipidemia   . Hypertension   . Tobacco use   . Pulmonary nodules   . CAD (coronary artery disease)   . Cirrhosis ([place])   . Alkaline phosphatase elevation   . Aortic atherosclerosis ([place])   . Multiple pulmonary nodules determined by computed tomography of lung 12/31/2014  . COPD mixed type  ([place]) 12/31/2014      Prior to Admission medications   Medication Sig Start Date End Date Taking? Authorizing Provider  albuterol (PROVENTIL HFA;VENTOLIN HFA) 108 (90 Base) MCG/ACT inhaler Inhale 2 puffs into the lungs every 4 (four) hours as needed for wheezing or shortness of breath. 12/21/18   Poulose, Bethel Born, NP  Ascorbic Acid (VITAMIN C) 100 MG tablet Take 1 tablet by mouth daily.    [provider]  aspirin EC 81 MG tablet Take 81 mg by mouth daily.    [provider]  atorvastatin (LIPITOR) 80 MG tablet Take 1 tablet (80 mg total) by mouth at bedtime. 12/21/18   Poulose, Bethel Born, NP  clopidogrel (PLAVIX) 75 MG tablet Take 75 mg by mouth daily.     [provider]  glipiZIDE (GLUCOTROL XL) 10 MG 24 hr tablet TAKE 1 TABLET BY MOUTH ONCE DAILY. 12/25/18   Poulose, Bethel Born, NP  ibuprofen (ADVIL,MOTRIN) 200 MG tablet Take 200 mg by mouth every 6 (six) hours as needed.    [provider]  Insulin Glargine (LANTUS SOLOSTAR) 100 UNIT/ML Solostar Pen Inject 12 Units into the skin at bedtime. 07/25/18   Poulose, Bethel Born, NP  losartan (COZAAR) 50 MG tablet Take 1 tablet (50 mg total) by mouth daily. 12/21/18   Poulose, Bethel Born, NP  metFORMIN (GLUCOPHAGE) 500 MG tablet TAKE 1 TABLET BY MOUTH TWICE DAILY WITH MEALS Patient taking differently: Take 1,000 mg by mouth 2 (two) times daily.  11/27/18   Poulose, Bethel Born, NP  metoprolol succinate (TOPROL-XL) 25 MG 24 hr tablet Take 1 tablet (25 mg total) by mouth daily. 12/21/18   Poulose, Bethel Born, NP  nitroGLYCERIN (NITROSTAT) 0.4 MG SL tablet Place 1 tablet (0.4 mg total) under the tongue every 5 (five) minutes as needed for chest pain. Max of 3 total; call 911 09/22/17   [name], Satira Anis, MD  pantoprazole (PROTONIX) 40 MG tablet Take 1 tablet (40 mg total) by mouth 2 (two) times daily. 12/21/18   Poulose, Bethel Born, NP  ursodiol (ACTIGALL) 300 MG capsule Take 2 capsules (600 mg total) by mouth 3  (three) times daily. Patient not taking: Reported on 01/17/2019 06/01/18   Lucilla Lame, MD    Allergies Canagliflozin and Semaglutide    Social History Social History   Tobacco Use  . Smoking status: Current Every Day Smoker    Packs/day: 1.50    Years: 45.00    Pack years: 67.50    Types: Cigarettes  . Smokeless tobacco: Never Used  . Tobacco comment: (10/18/17 - currently 6-10 cigs/day)  Substance Use Topics  . Alcohol use: No    Alcohol/week: 0.0 standard drinks  . Drug use: No    Review of Systems Patient denies headaches, rhinorrhea, blurry vision, numbness, shortness of breath,  chest pain, edema, cough, abdominal pain, nausea, vomiting, diarrhea, dysuria, fevers, rashes or hallucinations unless otherwise stated above in HPI. ____________________________________________   PHYSICAL EXAM:  VITAL SIGNS: Vitals:   01/17/19 2205  BP: (!) 162/80  Pulse: 76  Resp: 20  Temp: 98.1 F (36.7 C)  SpO2: 98%    Constitutional: Alert and oriented.  Eyes: Conjunctivae are normal.  Head: Atraumatic. Nose: No congestion/rhinnorhea. Mouth/Throat: Mucous membranes are moist.   Neck: No stridor. Painless ROM.  Cardiovascular: Normal rate, regular rhythm. Grossly normal heart sounds.  Good peripheral circulation. Respiratory: Normal respiratory effort.  No retractions. Lungs CTAB. Gastrointestinal: Soft with ttp in RUQ. No distention. No abdominal bruits. No CVA tenderness. Genitourinary:  Musculoskeletal: No lower extremity tenderness nor edema.  No joint effusions. Neurologic:  Normal speech and language. No gross focal neurologic deficits are appreciated. No facial droop Skin:  Skin is warm, dry and intact. No rash noted. Psychiatric: Mood and affect are normal. Speech and behavior are normal.  ____________________________________________   LABS (all labs ordered are listed, but only abnormal results are displayed)  Results for orders placed or performed during the  hospital encounter of 01/17/19 (from the past 24 hour(s))  Comprehensive metabolic panel     Status: Abnormal   Collection Time: 01/17/19 10:19 PM  Result Value Ref Range   Sodium 138 135 - 145 mmol/L   Potassium 3.6 3.5 - 5.1 mmol/L   Chloride 101 98 - 111 mmol/L   CO2 28 22 - 32 mmol/L   Glucose, Bld 177 (H) 70 - 99 mg/dL   BUN 16 6 - 20 mg/dL   Creatinine, Ser 0.70 0.61 - 1.24 mg/dL   Calcium 9.0 8.9 - 10.3 mg/dL   Total Protein 7.1 6.5 - 8.1 g/dL   Albumin 4.5 3.5 - 5.0 g/dL   AST 16 15 - 41 U/L   ALT 19 0 - 44 U/L   Alkaline Phosphatase 142 (H) 38 - 126 U/L   Total Bilirubin 1.0 0.3 - 1.2 mg/dL   GFR calc non Af Amer >60 >60 mL/min   GFR calc Af Amer >60 >60 mL/min   Anion gap 9 5 - 15  Lipase, blood     Status: None   Collection Time: 01/17/19 10:19 PM  Result Value Ref Range   Lipase 42 11 - 51 U/L   ____________________________________________ ____________________________________________  RADIOLOGY  I personally reviewed all radiographic images ordered to evaluate for the above acute complaints and reviewed radiology reports and findings.  These findings were personally discussed with the patient.  Please see medical record for radiology report.  ____________________________________________   PROCEDURES  Procedure(s) performed:  Procedures    Critical Care performed: no ____________________________________________   INITIAL IMPRESSION / ASSESSMENT AND PLAN / ED COURSE  Pertinent labs & imaging results that were available during my care of the patient were reviewed by me and considered in my medical decision making (see chart for details).   DDX: cholelithiasis, cholecystitis, hepatitis, pancreatitis, colits, msk strain biliary colic.  Scott Rosario is a 58 y.o. who presents to the ED with symptoms as described above.  Patient with leukocytosis to 12,000 and right upper quadrant tenderness to palpation.  Will order ultrasound to evaluate for the above  differential.  Patient will be signed out to oncoming physician pending results of ultrasound.  Have discussed with the patient and available family all diagnostics and treatments performed thus far and all questions were answered to the best of my ability. The  patient demonstrates understanding and agreement with plan.      The patient was evaluated in Emergency Department today for the symptoms described in the history of present illness. He/she was evaluated in the context of the global COVID-19 pandemic, which necessitated consideration that the patient might be at risk for infection with the SARS-CoV-2 virus that causes COVID-19. Institutional protocols and algorithms that pertain to the evaluation of patients at risk for COVID-19 are in a state of rapid change based on information released by regulatory bodies including the CDC and federal and state organizations. These policies and algorithms were followed during the patient's care in the ED.  As part of my medical decision making, I reviewed the following data within the [facility] notes reviewed and incorporated, Labs reviewed, notes from prior ED visits and [place] Controlled Substance Database   ____________________________________________   FINAL CLINICAL IMPRESSION(S) / ED DIAGNOSES  Final diagnoses:  Nausea & vomiting      NEW MEDICATIONS STARTED DURING THIS VISIT:  New Prescriptions   No medications on file     Note:  This document was prepared using Dragon voice recognition software and may include unintentional dictation errors.    Merlyn Lot, MD 01/17/19 2251

## 2019-01-17 NOTE — ED Triage Notes (Signed)
Pt in with co abd pain for 2 weeks states ruq. Had US done over a month ago that showed gallstones. Pt had repeat blood work done today and was told to come to ED for abnormal labs and repeat US.

## 2019-01-17 NOTE — Telephone Encounter (Signed)
Please contact Warnell Forester, NP at [facility] endocrinology Fax her a copy of my note from today Let her know that I am temporaily stopping his metformin, not to step on toes, but he is symptomatic with diarrhea and frequent stooling; not sure if that's the cause but at the very least don't want him dehydrated taking metformin with diarrhea We just want her to be aware Patient is going to monitor his FSBS 4x a day and watch sugars closely

## 2019-01-17 NOTE — Telephone Encounter (Signed)
Stat labs resulted; I called patient He is going to take the stool sample back in the morning; they did not want him to do the stool sample there Mildly elevated WBC, but above previous numbers Neutrophils similar to previous He is a smoker, smoking 1 ppd  Since our visit, no fever, no worsening pain; nothing has changed He had another spell of diarrhea this afternoon "Pain level has definitely went down" Pain level right now is a 3 out of 10 when pushing with fingertips Pain comes and goes still; not steady Feels it the most when he eats; doesn't matter what he eats; may last an hour or two after eating; starts 10-15 minutes after eating Does not feel feverish, "not at all" No nausea whatsoever right now RUQ tenderness is mild and only when pushing in with fingertips  Magnesium level is low; can be from metformin and/or diarrhea No chest pain or palpitations; does get cramps in the legs, going on for a while; especially at night; leg cramps going on for three months; eyelids do not twitch He does have magnesium supplement at home, OTC He has stopped his metformin  Keep phone by him and I'll call back  ---------------------------------------------------  I called to speak to the surgeon on-call

## 2019-01-18 ENCOUNTER — Other Ambulatory Visit
Admission: RE | Admit: 2019-01-18 | Discharge: 2019-01-18 | Disposition: A | Discharge status: Home / Self Care | Payer: BLUE CROSS/BLUE SHIELD | Source: Ambulatory Visit | Attending: Family Medicine | Admitting: Family Medicine

## 2019-01-18 ENCOUNTER — Encounter: Payer: Self-pay | Admitting: *Deleted

## 2019-01-18 ENCOUNTER — Telehealth: Payer: Self-pay | Admitting: *Deleted

## 2019-01-18 ENCOUNTER — Encounter: Payer: Self-pay | Admitting: Surgery

## 2019-01-18 ENCOUNTER — Ambulatory Visit (INDEPENDENT_AMBULATORY_CARE_PROVIDER_SITE_OTHER): Payer: Self-pay | Admitting: Surgery

## 2019-01-18 ENCOUNTER — Telehealth: Payer: Self-pay

## 2019-01-18 ENCOUNTER — Other Ambulatory Visit: Payer: Self-pay | Admitting: Family Medicine

## 2019-01-18 ENCOUNTER — Other Ambulatory Visit: Payer: Self-pay

## 2019-01-18 ENCOUNTER — Telehealth: Payer: Self-pay | Admitting: Family Medicine

## 2019-01-18 VITALS — BP 146/81 | HR 83 | Temp 97.5°F | Resp 16 | Ht 68.0 in | Wt 183.4 lb

## 2019-01-18 DIAGNOSIS — R197 Diarrhea, unspecified: Secondary | ICD-10-CM

## 2019-01-18 DIAGNOSIS — K8000 Calculus of gallbladder with acute cholecystitis without obstruction: Secondary | ICD-10-CM

## 2019-01-18 DIAGNOSIS — R1011 Right upper quadrant pain: Secondary | ICD-10-CM

## 2019-01-18 LAB — GASTROINTESTINAL PANEL BY PCR, STOOL (REPLACES STOOL CULTURE)

## 2019-01-18 LAB — C DIFFICILE QUICK SCREEN W PCR REFLEX??: C Diff antigen: NEGATIVE

## 2019-01-18 LAB — C DIFFICILE QUICK SCREEN W PCR REFLEX
C Diff interpretation: NOT DETECTED
C Diff toxin: NEGATIVE

## 2019-01-18 MED ORDER — OXYCODONE-ACETAMINOPHEN 5-325 MG PO TABS: 1.0000 | ORAL_TABLET | ORAL | 0 refills | Status: DC | PRN

## 2019-01-18 MED ORDER — ONDANSETRON 4 MG PO TBDP: 4.0000 mg | ORAL_TABLET | Freq: Three times a day (TID) | ORAL | 0 refills | Status: DC | PRN

## 2019-01-18 NOTE — Patient Instructions (Addendum)
Stop Plavix 01/24/2019 , we will confirm this date once we have received clearance from [facility].     You have requested to have your Gallbladder removed. We will arrange this to be done on May 6,2020 at [facility] with [facility].   You will be off from work for approximately 1-2 weeks depending on your recovery.   Please avoid greasy and fried foods if at all possible prior to your scheduled surgery to decrease symptoms until then.  If you have any questions or concerns please call our office.    Cholelithiasis Cholelithiasis is a form of gallbladder disease in which gallstones form in the gallbladder. The gallbladder is an organ that stores bile. Bile is made in the liver, and it helps to digest fats. Gallstones begin as small crystals and slowly grow into stones. They may cause no symptoms until the gallbladder tightens (contracts) and a gallstone is blocking the duct (gallbladder attack), which can cause pain. Cholelithiasis is also referred to as gallstones. There are two main types of gallstones:  Cholesterol stones. These are made of hardened cholesterol and are usually yellow-green in color. They are the most common type of gallstone. Cholesterol is a white, waxy, fat-like substance that is made in the liver.  Pigment stones. These are dark in color and are made of a red-yellow substance that forms when hemoglobin from red blood cells breaks down (bilirubin).  What are the causes? This condition may be caused by an imbalance in the substances that bile is made of. This can happen if the bile:  Has too much bilirubin.  Has too much cholesterol.  Does not have enough bile salts. These salts help the body absorb and digest fats.  In some cases, this condition can also be caused by the gallbladder not emptying completely or often enough. What increases the risk? The following factors may make you more likely to develop this condition:  Being male.  Having  multiple pregnancies. Health care providers sometimes advise removing diseased gallbladders before future pregnancies.  Eating a diet that is heavy in fried foods, fat, and refined carbohydrates, like white bread and white rice.  Being obese.  Being older than age 36.  Prolonged use of medicines that contain male hormones (estrogen).  Having diabetes mellitus.  Rapidly losing weight.  Having a family history of gallstones.  Being of [place] or [place] descent.  Having an intestinal disease such as Crohn disease.  Having metabolic syndrome.  Having cirrhosis.  Having severe types of anemia such as sickle cell anemia.  What are the signs or symptoms? In most cases, there are no symptoms. These are known as silent gallstones. If a gallstone blocks the bile ducts, it can cause a gallbladder attack. The main symptom of a gallbladder attack is sudden pain in the upper right abdomen. The pain usually comes at night or after eating a large meal. The pain can last for one or several hours and can spread to the right shoulder or chest. If the bile duct is blocked for more than a few hours, it can cause infection or inflammation of the gallbladder, liver, or pancreas, which may cause:  Nausea.  Vomiting.  Abdominal pain that lasts for 5 hours or more.  Fever or chills.  Yellowing of the skin or the whites of the eyes (jaundice).  Dark urine.  Light-colored stools.  How is this diagnosed? This condition may be diagnosed based on:  A physical exam.  Your medical history.  An  ultrasound of your gallbladder.  CT scan.  MRI.  Blood tests to check for signs of infection or inflammation.  A scan of your gallbladder and bile ducts (biliary system) using nonharmful radioactive material and special cameras that can see the radioactive material (cholescintigram). This test checks to see how your gallbladder contracts and whether bile ducts are blocked.  Inserting a  small tube with a camera on the end (endoscope) through your mouth to inspect bile ducts and check for blockages (endoscopic retrograde cholangiopancreatogram).  How is this treated? Treatment for gallstones depends on the severity of the condition. Silent gallstones do not need treatment. If the gallstones cause a gallbladder attack or other symptoms, treatment may be required. Options for treatment include:  Surgery to remove the gallbladder (cholecystectomy). This is the most common treatment.  Medicines to dissolve gallstones. These are most effective at treating small gallstones. You may need to take medicines for up to 6-12 months.  Shock wave treatment (extracorporeal biliary lithotripsy). In this treatment, an ultrasound machine sends shock waves to the gallbladder to break gallstones into smaller pieces. These pieces can then be passed into the intestines or be dissolved by medicine. This is rarely used.  Removing gallstones through endoscopic retrograde cholangiopancreatogram. A small basket can be attached to the endoscope and used to capture and remove gallstones.  Follow these instructions at home:  Take over-the-counter and prescription medicines only as told by your health care provider.  Maintain a healthy weight and follow a healthy diet. This includes: ? Reducing fatty foods, such as fried food. ? Reducing refined carbohydrates, like white bread and white rice. ? Increasing fiber. Aim for foods like almonds, fruit, and beans.  Keep all follow-up visits as told by your health care provider. This is important. Contact a health care provider if:  You think you have had a gallbladder attack.  You have been diagnosed with silent gallstones and you develop abdominal pain or indigestion. Get help right away if:  You have pain from a gallbladder attack that lasts for more than 2 hours.  You have abdominal pain that lasts for more than 5 hours.  You have a fever or chills.   You have persistent nausea and vomiting.  You develop jaundice.  You have dark urine or light-colored stools. Summary  Cholelithiasis (also called gallstones) is a form of gallbladder disease in which gallstones form in the gallbladder.  This condition is caused by an imbalance in the substances that make up bile. This can happen if the bile has too much cholesterol, too much bilirubin, or not enough bile salts.  You are more likely to develop this condition if you are male, pregnant, using medicines with estrogen, obese, older than age 76, or have a family history of gallstones. You may also develop gallstones if you have diabetes, an intestinal disease, cirrhosis, or metabolic syndrome.  Treatment for gallstones depends on the severity of the condition. Silent gallstones do not need treatment.  If gallstones cause a gallbladder attack or other symptoms, treatment may be needed. The most common treatment is surgery to remove the gallbladder. This information is not intended to replace advice given to you by your health care provider. Make sure you discuss any questions you have with your health care provider. Document Released: 09/09/2005 Document Revised: 05/30/2016 Document Reviewed: 05/30/2016 Elsevier Interactive Patient Education  2017 [employer] If you have a gallbladder condition, you may have trouble digesting fats. Eating a low-fat diet  can help reduce your symptoms, and may be helpful before and after having surgery to remove your gallbladder (cholecystectomy). Your health care provider may recommend that you work with a diet and nutrition specialist (dietitian) to help you reduce the amount of fat in your diet. What are tips for following this plan? General guidelines  Limit your fat intake to less than 30% of your total daily calories. If you eat around 1,800 calories each day, this is less than 60 grams (g) of fat per day.  Fat is an  important part of a healthy diet. Eating a low-fat diet can make it hard to maintain a healthy body weight. Ask your dietitian how much fat, calories, and other nutrients you need each day.  Eat small, frequent meals throughout the day instead of three large meals.  Drink at least 8-10 cups of fluid a day. Drink enough fluid to keep your urine clear or pale yellow.  Limit alcohol intake to no more than 1 drink a day for nonpregnant women and 2 drinks a day for men. One drink equals 12 oz of beer, 5 oz of wine, or 1 oz of hard liquor. Reading food labels  Check Nutrition Facts on food labels for the amount of fat per serving. Choose foods with less than 3 grams of fat per serving. Shopping  Choose nonfat and low-fat healthy foods. Look for the words "nonfat," "low fat," or "fat free."  Avoid buying processed or prepackaged foods. Cooking  Cook using low-fat methods, such as baking, broiling, grilling, or boiling.  Cook with small amounts of healthy fats, such as olive oil, grapeseed oil, canola oil, or sunflower oil. What foods are recommended?   All fresh, frozen, or canned fruits and vegetables.  Whole grains.  Low-fat or non-fat (skim) milk and yogurt.  Lean meat, skinless poultry, fish, eggs, and beans.  Low-fat protein supplement powders or drinks.  Spices and herbs. What foods are not recommended?  High-fat foods. These include baked goods, fast food, fatty cuts of meat, ice cream, french toast, sweet rolls, pizza, cheese bread, foods covered with butter, creamy sauces, or cheese.  Fried foods. These include french fries, tempura, battered fish, breaded chicken, fried breads, and sweets.  Foods with strong odors.  Foods that cause bloating and gas. Summary  A low-fat diet can be helpful if you have a gallbladder condition, or before and after gallbladder surgery.  Limit your fat intake to less than 30% of your total daily calories. This is about 60 g of fat if you  eat 1,800 calories each day.  Eat small, frequent meals throughout the day instead of three large meals. This information is not intended to replace advice given to you by your health care provider. Make sure you discuss any questions you have with your health care provider. Document Released: 09/18/2013 Document Revised: 10/21/2016 Document Reviewed: 10/21/2016 Elsevier Interactive Patient Education  2019 [employer].   Cholelithiasis  Cholelithiasis is also called "gallstones." It is a kind of gallbladder disease. The gallbladder is an organ that stores a liquid (bile) that helps you digest fat. Gallstones may not cause symptoms (may be silent gallstones) until they cause a blockage, and then they can cause pain (gallbladder attack). Follow these instructions at home:  Take over-the-counter and prescription medicines only as told by your doctor.  Stay at a healthy weight.  Eat healthy foods. This includes: ? Eating fewer fatty foods, like fried foods. ? Eating fewer refined carbs (refined carbohydrates). Refined carbs  are breads and grains that are highly processed, like white bread and white rice. Instead, choose whole grains like whole-wheat bread and brown rice. ? Eating more fiber. Almonds, fresh fruit, and beans are healthy sources of fiber.  Keep all follow-up visits as told by your doctor. This is important. Contact a doctor if:  You have sudden pain in the upper right side of your belly (abdomen). Pain might spread to your right shoulder or your chest. This may be a sign of a gallbladder attack.  You feel sick to your stomach (are nauseous).  You throw up (vomit).  You have been diagnosed with gallstones that have no symptoms and you get: ? Belly pain. ? Discomfort, burning, or fullness in the upper part of your belly (indigestion). Get help right away if:  You have sudden pain in the upper right side of your belly, and it lasts for more than 2 hours.  You have belly  pain that lasts for more than 5 hours.  You have a fever or chills.  You keep feeling sick to your stomach or you keep throwing up.  Your skin or the whites of your eyes turn yellow (jaundice).  You have dark-colored pee (urine).  You have light-colored poop (stool). Summary  Cholelithiasis is also called "gallstones."  The gallbladder is an organ that stores a liquid (bile) that helps you digest fat.  Silent gallstones are gallstones that do not cause symptoms.  A gallbladder attack may cause sudden pain in the upper right side of your belly. Pain might spread to your right shoulder or your chest. If this happens, contact your doctor.  If you have sudden pain in the upper right side of your belly that lasts for more than 2 hours, get help right away. This information is not intended to replace advice given to you by your health care provider. Make sure you discuss any questions you have with your health care provider. Document Released: 03/01/2008 Document Revised: 05/30/2016 Document Reviewed: 05/30/2016 Elsevier Interactive Patient Education  2019 [employer].

## 2019-01-18 NOTE — Telephone Encounter (Signed)
Per Dr. Rosana Hoes, okay to see patient today.   Patient contacted and can be at the office in about 10 -15 minutes.

## 2019-01-18 NOTE — Telephone Encounter (Signed)
Anticoagulant clearance faxed to [facility] at this time. Received confirmation.

## 2019-01-18 NOTE — Progress Notes (Signed)
Patient's surgery to be scheduled for 01-31-19 at [facility] with Dr. Rosana Hoes. He is aware to enter at the [place] entrance and then will be sent to Same Day Surgery. Patient aware to take his last dose of Plavix on 01-24-19 pending Dr. Derrick Ravel approval. A formal cardiac clearance request was faxed to his office today and received per Mardene Celeste.  The patient is aware he will need to Pre-Admit. Patient to be contacted once date and time arranged. He is requesting a Tuesday, Wednesday, or Thursday. Patient aware to go through the [facility] due to COVID-19 restrictions where he will be screened for the coronavirus .  The patient verbalizes understanding of the above.   The patient is aware to call the office should he have further questions.

## 2019-01-18 NOTE — Telephone Encounter (Signed)
Patient seen by surgeon today; thank you

## 2019-01-18 NOTE — Telephone Encounter (Signed)
Surgeon on-call last night contacted me Final note from ER not available yet The ER did not call surgeon last night [place], 7 days without Plavix prior to surgery if cardiology approves that cipro and flagyl antibiotics may be helpful if still having pain He is happy to see him or the other group, whoever is in patient's network

## 2019-01-18 NOTE — Progress Notes (Signed)
Surgical Clinic History and Physical  Referring provider:  Arnetha Courser, MD [address] [place], [place] [address]  HISTORY OF PRESENT ILLNESS (HPI):  58 y.o. male presents for evaluation of abdominal pain. Patient reports he 2 weeks ago experienced acute onset of severe RUQ abdominal pain after eating a steak cheeseburger. Whereas he's previously experienced lesser RUQ abdominal pain that had resolved within a few hours x >1 year, the pain he experienced 2 weeks ago lasted 2.5 days, though remained resolved until 4.5 days ago. 4.5 days ago, patient again experienced acute onset of severe RUQ abdominal pain, again after having eaten another steak cheeseburger. However, this time patient's pain did not resolve, for which he called his primary care physician, who repeated his CBC and CMP and referred him to ED for ultrasound and further management. Following workup and administration of antibiotics, patient was discharged to home with a prescription for Cipro and Flagyl and surgical follow-up.   Patient says his pain today has improved with +flatus and BM's WNL following ED workup and antibiotics, and he denies fever/chills, N/V, CP, or SOB. Of note, patient has twice underwent PCI with stenting for MI's (2005 & 2017), and remains on aspirin and Plavix, though has been approved on multiple occasions to hold his Plavix for dental procedures. 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 He acknowledges he has again been smoking 1 ppd since previously reducing to 10 cigarettes per day.  PAST MEDICAL HISTORY (PMH):  Past Medical History:  Diagnosis Date  . Alkaline phosphatase elevation   . Aortic aneurysm ([place]) 12/10/2018   3.4 cm March 2020  . Aortic atherosclerosis ([place])    on CT 02/25/14  . Asthma   . CAD (coronary artery disease)   . Cirrhosis ([place])   .  COPD (chronic obstructive pulmonary disease) ([place])   . Diabetes mellitus without complication ([place])   . Emphysema of lung ([place])   . GERD (gastroesophageal reflux disease)   . Heart attack ([place])    2005, 2017 (stent after each)  . Hyperlipidemia   . Hypertension   . Pulmonary nodules    x 3, (38mm, 78mm, 65mm) chest CT February 25, 2014  . Renal cyst    on CT 02/25/14  . Tobacco use     PAST SURGICAL HISTORY ([place]):  Past Surgical History:  Procedure Laterality Date  . APPENDECTOMY    . CARDIAC CATHETERIZATION Left 03/09/2016   Procedure: Left Heart Cath and Coronary Angiography;  Surgeon: Corey Skains, MD;  Location: [facility];  Service: Cardiovascular;  Laterality: Left;  . CARDIAC CATHETERIZATION N/A 03/09/2016   Procedure: Coronary Stent Intervention;  Surgeon: Isaias Cowman, MD;  Location: [facility];  Service: Cardiovascular;  Laterality: N/A;  . CORONARY ANGIOPLASTY WITH STENT PLACEMENT  10/25/2003   x 2  . ESOPHAGOGASTRODUODENOSCOPY (EGD) WITH PROPOFOL N/A 10/24/2017   Procedure: ESOPHAGOGASTRODUODENOSCOPY (EGD) WITH PROPOFOL;  Surgeon: Lucilla Lame, MD;  Location: [facility];  Service: Endoscopy;  Laterality: N/A;  Diabetic - oral meds    MEDICATIONS:  Prior to Admission medications   Medication Sig Start Date End Date Taking? Authorizing Provider  albuterol (PROVENTIL HFA;VENTOLIN HFA) 108 (90 Base) MCG/ACT inhaler Inhale 2 puffs into the lungs every 4 (four) hours as needed for wheezing or shortness of breath. 12/21/18  Yes Poulose, Bethel Born, NP  Ascorbic Acid (  VITAMIN C) 100 MG tablet Take 1 tablet by mouth daily.   Yes [provider]  aspirin EC 81 MG tablet Take 81 mg by mouth daily.   Yes [provider]  atorvastatin (LIPITOR) 80 MG tablet Take 1 tablet (80 mg total) by mouth at bedtime. 12/21/18  Yes Poulose, Bethel Born, NP  clopidogrel (PLAVIX) 75 MG tablet Take 75 mg by mouth daily.    Yes [provider]   glipiZIDE (GLUCOTROL XL) 10 MG 24 hr tablet TAKE 1 TABLET BY MOUTH ONCE DAILY. 12/25/18  Yes Poulose, Bethel Born, NP  Insulin Glargine (LANTUS SOLOSTAR) 100 UNIT/ML Solostar Pen Inject 12 Units into the skin at bedtime. 07/25/18  Yes Poulose, Bethel Born, NP  losartan (COZAAR) 50 MG tablet Take 1 tablet (50 mg total) by mouth daily. 12/21/18  Yes Poulose, Bethel Born, NP  metFORMIN (GLUCOPHAGE) 500 MG tablet TAKE 1 TABLET BY MOUTH TWICE DAILY WITH MEALS Patient taking differently: Take 1,000 mg by mouth 2 (two) times daily.  11/27/18  Yes Poulose, Bethel Born, NP  metoprolol succinate (TOPROL-XL) 25 MG 24 hr tablet Take 1 tablet (25 mg total) by mouth daily. 12/21/18  Yes Poulose, Bethel Born, NP  nitroGLYCERIN (NITROSTAT) 0.4 MG SL tablet Place 1 tablet (0.4 mg total) under the tongue every 5 (five) minutes as needed for chest pain. Max of 3 total; call 911 09/22/17  Yes [name], Satira Anis, MD  oxyCODONE-acetaminophen (PERCOCET) 5-325 MG tablet Take 1 tablet by mouth every 4 (four) hours as needed for up to 12 doses. 01/18/19  Yes Gregor Hams, MD  pantoprazole (PROTONIX) 40 MG tablet Take 1 tablet (40 mg total) by mouth 2 (two) times daily. 12/21/18  Yes Poulose, Bethel Born, NP  ursodiol (ACTIGALL) 300 MG capsule Take 2 capsules (600 mg total) by mouth 3 (three) times daily. 06/01/18  Yes Lucilla Lame, MD    ALLERGIES:  Allergies  Allergen Reactions  . Canagliflozin Other (See Comments)    adverse reactions: Fatigue and Zombie feeling   . Semaglutide Diarrhea and Nausea And Vomiting    Ozempic  Acid Reflux    SOCIAL HISTORY:  Social History   Socioeconomic History  . Marital status: Married    Spouse name: Not on file  . Number of children: Not on file  . Years of education: Not on file  . Highest education level: Not on file  Occupational History  . Not on file  Social Needs  . Financial resource strain: Not on file  . Food insecurity:    Worry: Not on file    Inability: Not on  file  . Transportation needs:    Medical: Not on file    Non-medical: Not on file  Tobacco Use  . Smoking status: Current Every Day Smoker    Packs/day: 1.50    Years: 45.00    Pack years: 67.50    Types: Cigarettes  . Smokeless tobacco: Never Used  . Tobacco comment: (10/18/17 - currently 6-10 cigs/day)  Substance and Sexual Activity  . Alcohol use: No    Alcohol/week: 0.0 standard drinks  . Drug use: No  . Sexual activity: Yes  Lifestyle  . Physical activity:    Days per week: Not on file    Minutes per session: Not on file  . Stress: Not on file  Relationships  . Social connections:    Talks on phone: Not on file    Gets together: Not on file    Attends religious  service: Not on file    Active member of club or organization: Not on file    Attends meetings of clubs or organizations: Not on file    Relationship status: Not on file  . Intimate partner violence:    Fear of current or ex partner: Not on file    Emotionally abused: Not on file    Physically abused: Not on file    Forced sexual activity: Not on file  Other Topics Concern  . Not on file  Social History Narrative   Active and independent at baseline    The patient currently resides (home / rehab facility / nursing home): Home The patient normally is (ambulatory / bedbound): Ambulatory  FAMILY HISTORY:  Family History  Problem Relation Age of Onset  . Diabetes Mother   . Hypertension Mother   . Arthritis Mother   . Hyperlipidemia Mother   . Heart attack Father   . Epilepsy Father   . Diabetes Father   . Seizures Father   . Heart disease Father   . Hyperlipidemia Father   . Hypertension Father   . Stroke Father   . Diabetes Brother   . Hyperlipidemia Brother   . Hypertension Brother   . Cancer Maternal Grandmother        unknown  . Diabetes Paternal Grandmother   . Heart disease Paternal Grandmother   . Hyperlipidemia Paternal Grandmother   . Hypertension Paternal Grandmother   . Heart  attack Paternal Grandfather     Otherwise negative/non-contributory.  REVIEW OF SYSTEMS:  Constitutional: denies any other weight loss, fever, chills, or sweats  Eyes: denies any other vision changes, history of eye injury  ENT: denies sore throat, hearing problems  Respiratory: denies shortness of breath, wheezing  Cardiovascular: denies chest pain, palpitations  Gastrointestinal: abdominal pain, N/V, and bowel function as per HPI Musculoskeletal: denies any other joint pains or cramps  Skin: Denies any other rashes or skin discolorations Neurological: denies any other headache, dizziness, weakness  Psychiatric: Denies any other depression, anxiety   All other review of systems were otherwise negative   VITAL SIGNS:  BP (!) 146/81   Pulse 83   Temp (!) 97.5 F (36.4 C) (Temporal)   Resp 16   Ht 5\' 8"  (1.727 m)   Wt 183 lb 6.4 oz (83.2 kg)   SpO2 (!) 16%   BMI 27.89 kg/m   PHYSICAL EXAM:  Constitutional:  -- Normal body habitus  -- Awake, alert, and oriented x3  Eyes:  -- Pupils equally round and reactive to light  -- No scleral icterus  Ear, nose, throat:  -- No jugular venous distension -- No nasal drainage, bleeding Pulmonary:  -- No crackles  -- Equal breath sounds bilaterally -- Breathing non-labored at rest Cardiovascular:  -- S1, S2 present  -- No pericardial rubs  Gastrointestinal:  -- Abdomen soft and non-distended with moderate RUQ tenderness to palpation, no guarding/rebound tenderness -- No abdominal masses appreciated, pulsatile or otherwise  Musculoskeletal and Integumentary:  -- Wounds or skin discoloration: None appreciated except well-healed former laparoscopic port site scars s/p appendectomy -- Extremities: B/L UE and LE FROM, hands and feet warm, no edema  Neurologic:  -- Motor function: Intact and symmetric -- Sensation: Intact and symmetric  Labs:  CBC Latest Ref Rng & Units 01/17/2019 05/11/2018 04/03/2018  WBC 4.0 - 10.5 K/uL 12.7(H) 10.2  11.3(H)  Hemoglobin 13.0 - 17.0 g/dL 14.6 13.9 14.9  Hematocrit 39.0 - 52.0 % 42.3 39.6(L) 42.1  Platelets 150 - 400 K/uL 232 194 214   CMP Latest Ref Rng & Units 01/17/2019 01/17/2019 11/29/2018  Glucose 70 - 99 mg/dL 177(H) 125(H) 139(H)  BUN 6 - 20 mg/dL 16 14 15   Creatinine 0.61 - 1.24 mg/dL 0.70 0.70 0.81  Sodium 135 - 145 mmol/L 138 137 139  Potassium 3.5 - 5.1 mmol/L 3.6 4.2 4.5  Chloride 98 - 111 mmol/L 101 104 104  CO2 22 - 32 mmol/L 28 26 30   Calcium 8.9 - 10.3 mg/dL 9.0 8.8(L) 9.3  Total Protein 6.5 - 8.1 g/dL 7.1 6.8 6.5  Total Bilirubin 0.3 - 1.2 mg/dL 1.0 1.1 1.0  Alkaline Phos 38 - 126 U/L 142(H) 138(H) -  AST 15 - 41 U/L 16 16 18   ALT 0 - 44 U/L 19 19 22    HbA1C (11/29/2018): 8.6  Imaging studies:  Limited RUQ Abdominal Ultrasound (01/17/2019) 9 mm gallstone, mobile. No wall thickening.  Common bile duct diameter: Normal caliber, 3 mm.  Assessment/Plan:  58 y.o. male with leukocytosis, RUQ abdominal pain, and history consistent with acute calculous cholecystitis despite gallbladder wall thickening or pericholecystic fluid not demonstrated on recent abdominal ultrasound, complicated by co-morbidities including poorly controlled DM now on insulin, HTN, HLD, CAD s/p PCI with stents x2 for MI's (2005, 2017) on chronic dual antiplatelet therapy, 3.4 cm AAA (11/2018), COPD, renal cyst, biopsy-demonstrated primary sclerosing cholangitis, and chronic ongoing tobacco abuse (smoking).              - avoid/minimize foods with higher fat content (meats, cheeses/dairy, and fried)             - prefer low-fat vegetables, whole grains (wheat bread, ceareals, etc), and fruits until cholecystectomy              - all risks, benefits, and alternatives to cholecystectomy were discussed with the patient, all of his questions were answered to patient's expressed satisfaction, patient expresses he wishes to proceed, and informed consent was obtained.             - would have proceeded with  cholecystectomy while on call tomorrow (all scheduled urgent surgeries currently restricted to call day due to ongoing Covid-19 pandemic restrictions), but patient last took Plavix and aspirin yesterday, so will instead plan for laparoscopic cholecystectomy on Wednesday, 5/6 pending cardiology evaluation and approval to hold Plavix x7 days prior to surgery, though anticipate will likely resume the day or two following surgery if no contraindication  - risks of smoking, particularly in context of poorly controlled DM, were discussed and absolute cessation of smoking strongly encouraged             - anticipate return to clinic 2 weeks after above planned surgery             - instructed to call if any questions or concerns  All of the above recommendations were discussed with the patient, and all of patient's questions were answered to his expressed satisfaction.  Thank you for the opportunity to participate in this patient's care.  -- Marilynne Drivers Rosana Hoes, MD, [place]: [facility] - Partnering for exceptional care. Office: [phone]

## 2019-01-18 NOTE — ED Notes (Signed)
Dr. Owens Shark at the bedside to discuss ultrasound result. Pt resting on stretcher watching television. No distress noted. Provided for safety and comfort and will continue to assess.

## 2019-01-18 NOTE — H&P (View-Only) (Signed)
Surgical Clinic History and Physical  Referring provider:  Arnetha Courser, MD [address] [place], [place] [address]  HISTORY OF PRESENT ILLNESS (HPI):  58 y.o. male presents for evaluation of abdominal pain. Patient reports he 2 weeks ago experienced acute onset of severe RUQ abdominal pain after eating a steak cheeseburger. Whereas he's previously experienced lesser RUQ abdominal pain that had resolved within a few hours x >1 year, the pain he experienced 2 weeks ago lasted 2.5 days, though remained resolved until 4.5 days ago. 4.5 days ago, patient again experienced acute onset of severe RUQ abdominal pain, again after having eaten another steak cheeseburger. However, this time patient's pain did not resolve, for which he called his primary care physician, who repeated his CBC and CMP and referred him to ED for ultrasound and further management. Following workup and administration of antibiotics, patient was discharged to home with a prescription for Cipro and Flagyl and surgical follow-up.   Patient says his pain today has improved with +flatus and BM's WNL following ED workup and antibiotics, and he denies fever/chills, N/V, CP, or SOB. Of note, patient has twice underwent PCI with stenting for MI's (2005 & 2017), and remains on aspirin and Plavix, though has been approved on multiple occasions to hold his Plavix for dental procedures. 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 He acknowledges he has again been smoking 1 ppd since previously reducing to 10 cigarettes per day.  PAST MEDICAL HISTORY (PMH):  Past Medical History:  Diagnosis Date  . Alkaline phosphatase elevation   . Aortic aneurysm ([facility]) 12/10/2018   3.4 cm March 2020  . Aortic atherosclerosis ([facility])    on CT 02/25/14  . Asthma   . CAD (coronary artery disease)   . Cirrhosis ([facility])   .  COPD (chronic obstructive pulmonary disease) ([facility] [facility])   . Diabetes mellitus without complication ([facility])   . Emphysema of lung ([place] [facility])   . GERD (gastroesophageal reflux disease)   . Heart attack ([place])    2005, 2017 (stent after each)  . Hyperlipidemia   . Hypertension   . Pulmonary nodules    x 3, (7mm, 24mm, 78mm) chest CT February 25, 2014  . Renal cyst    on CT 02/25/14  . Tobacco use     PAST SURGICAL HISTORY ([place]):  Past Surgical History:  Procedure Laterality Date  . APPENDECTOMY    . CARDIAC CATHETERIZATION Left 03/09/2016   Procedure: Left Heart Cath and Coronary Angiography;  Surgeon: Corey Skains, MD;  Location: [facility];  Service: Cardiovascular;  Laterality: Left;  . CARDIAC CATHETERIZATION N/A 03/09/2016   Procedure: Coronary Stent Intervention;  Surgeon: Isaias Cowman, MD;  Location: [facility];  Service: Cardiovascular;  Laterality: N/A;  . CORONARY ANGIOPLASTY WITH STENT PLACEMENT  10/25/2003   x 2  . ESOPHAGOGASTRODUODENOSCOPY (EGD) WITH PROPOFOL N/A 10/24/2017   Procedure: ESOPHAGOGASTRODUODENOSCOPY (EGD) WITH PROPOFOL;  Surgeon: Lucilla Lame, MD;  Location: [facility];  Service: Endoscopy;  Laterality: N/A;  Diabetic - oral meds    MEDICATIONS:  Prior to Admission medications   Medication Sig Start Date End Date Taking? Authorizing Provider  albuterol (PROVENTIL HFA;VENTOLIN HFA) 108 (90 Base) MCG/ACT inhaler Inhale 2 puffs into the lungs every 4 (four) hours as needed for wheezing or shortness of breath. 12/21/18  Yes Poulose, Bethel Born, NP  Ascorbic Acid (  VITAMIN C) 100 MG tablet Take 1 tablet by mouth daily.   Yes [provider]  aspirin EC 81 MG tablet Take 81 mg by mouth daily.   Yes [provider]  atorvastatin (LIPITOR) 80 MG tablet Take 1 tablet (80 mg total) by mouth at bedtime. 12/21/18  Yes Poulose, Bethel Born, NP  clopidogrel (PLAVIX) 75 MG tablet Take 75 mg by mouth daily.    Yes [provider]   glipiZIDE (GLUCOTROL XL) 10 MG 24 hr tablet TAKE 1 TABLET BY MOUTH ONCE DAILY. 12/25/18  Yes Poulose, Bethel Born, NP  Insulin Glargine (LANTUS SOLOSTAR) 100 UNIT/ML Solostar Pen Inject 12 Units into the skin at bedtime. 07/25/18  Yes Poulose, Bethel Born, NP  losartan (COZAAR) 50 MG tablet Take 1 tablet (50 mg total) by mouth daily. 12/21/18  Yes Poulose, Bethel Born, NP  metFORMIN (GLUCOPHAGE) 500 MG tablet TAKE 1 TABLET BY MOUTH TWICE DAILY WITH MEALS Patient taking differently: Take 1,000 mg by mouth 2 (two) times daily.  11/27/18  Yes Poulose, Bethel Born, NP  metoprolol succinate (TOPROL-XL) 25 MG 24 hr tablet Take 1 tablet (25 mg total) by mouth daily. 12/21/18  Yes Poulose, Bethel Born, NP  nitroGLYCERIN (NITROSTAT) 0.4 MG SL tablet Place 1 tablet (0.4 mg total) under the tongue every 5 (five) minutes as needed for chest pain. Max of 3 total; call 911 09/22/17  Yes [name], Satira Anis, MD  oxyCODONE-acetaminophen (PERCOCET) 5-325 MG tablet Take 1 tablet by mouth every 4 (four) hours as needed for up to 12 doses. 01/18/19  Yes Gregor Hams, MD  pantoprazole (PROTONIX) 40 MG tablet Take 1 tablet (40 mg total) by mouth 2 (two) times daily. 12/21/18  Yes Poulose, Bethel Born, NP  ursodiol (ACTIGALL) 300 MG capsule Take 2 capsules (600 mg total) by mouth 3 (three) times daily. 06/01/18  Yes Lucilla Lame, MD    ALLERGIES:  Allergies  Allergen Reactions  . Canagliflozin Other (See Comments)    adverse reactions: Fatigue and Zombie feeling   . Semaglutide Diarrhea and Nausea And Vomiting    Ozempic  Acid Reflux    SOCIAL HISTORY:  Social History   Socioeconomic History  . Marital status: Married    Spouse name: Not on file  . Number of children: Not on file  . Years of education: Not on file  . Highest education level: Not on file  Occupational History  . Not on file  Social Needs  . Financial resource strain: Not on file  . Food insecurity:    Worry: Not on file    Inability: Not on  file  . Transportation needs:    Medical: Not on file    Non-medical: Not on file  Tobacco Use  . Smoking status: Current Every Day Smoker    Packs/day: 1.50    Years: 45.00    Pack years: 67.50    Types: Cigarettes  . Smokeless tobacco: Never Used  . Tobacco comment: (10/18/17 - currently 6-10 cigs/day)  Substance and Sexual Activity  . Alcohol use: No    Alcohol/week: 0.0 standard drinks  . Drug use: No  . Sexual activity: Yes  Lifestyle  . Physical activity:    Days per week: Not on file    Minutes per session: Not on file  . Stress: Not on file  Relationships  . Social connections:    Talks on phone: Not on file    Gets together: Not on file    Attends religious  service: Not on file    Active member of club or organization: Not on file    Attends meetings of clubs or organizations: Not on file    Relationship status: Not on file  . Intimate partner violence:    Fear of current or ex partner: Not on file    Emotionally abused: Not on file    Physically abused: Not on file    Forced sexual activity: Not on file  Other Topics Concern  . Not on file  Social History Narrative   Active and independent at baseline    The patient currently resides (home / rehab facility / nursing home): Home The patient normally is (ambulatory / bedbound): Ambulatory  FAMILY HISTORY:  Family History  Problem Relation Age of Onset  . Diabetes Mother   . Hypertension Mother   . Arthritis Mother   . Hyperlipidemia Mother   . Heart attack Father   . Epilepsy Father   . Diabetes Father   . Seizures Father   . Heart disease Father   . Hyperlipidemia Father   . Hypertension Father   . Stroke Father   . Diabetes Brother   . Hyperlipidemia Brother   . Hypertension Brother   . Cancer Maternal Grandmother        unknown  . Diabetes Paternal Grandmother   . Heart disease Paternal Grandmother   . Hyperlipidemia Paternal Grandmother   . Hypertension Paternal Grandmother   . Heart  attack Paternal Grandfather     Otherwise negative/non-contributory.  REVIEW OF SYSTEMS:  Constitutional: denies any other weight loss, fever, chills, or sweats  Eyes: denies any other vision changes, history of eye injury  ENT: denies sore throat, hearing problems  Respiratory: denies shortness of breath, wheezing  Cardiovascular: denies chest pain, palpitations  Gastrointestinal: abdominal pain, N/V, and bowel function as per HPI Musculoskeletal: denies any other joint pains or cramps  Skin: Denies any other rashes or skin discolorations Neurological: denies any other headache, dizziness, weakness  Psychiatric: Denies any other depression, anxiety   All other review of systems were otherwise negative   VITAL SIGNS:  BP (!) 146/81   Pulse 83   Temp (!) 97.5 F (36.4 C) (Temporal)   Resp 16   Ht 5\' 8"  (1.727 m)   Wt 183 lb 6.4 oz (83.2 kg)   SpO2 (!) 16%   BMI 27.89 kg/m   PHYSICAL EXAM:  Constitutional:  -- Normal body habitus  -- Awake, alert, and oriented x3  Eyes:  -- Pupils equally round and reactive to light  -- No scleral icterus  Ear, nose, throat:  -- No jugular venous distension -- No nasal drainage, bleeding Pulmonary:  -- No crackles  -- Equal breath sounds bilaterally -- Breathing non-labored at rest Cardiovascular:  -- S1, S2 present  -- No pericardial rubs  Gastrointestinal:  -- Abdomen soft and non-distended with moderate RUQ tenderness to palpation, no guarding/rebound tenderness -- No abdominal masses appreciated, pulsatile or otherwise  Musculoskeletal and Integumentary:  -- Wounds or skin discoloration: None appreciated except well-healed former laparoscopic port site scars s/p appendectomy -- Extremities: B/L UE and LE FROM, hands and feet warm, no edema  Neurologic:  -- Motor function: Intact and symmetric -- Sensation: Intact and symmetric  Labs:  CBC Latest Ref Rng & Units 01/17/2019 05/11/2018 04/03/2018  WBC 4.0 - 10.5 K/uL 12.7(H) 10.2  11.3(H)  Hemoglobin 13.0 - 17.0 g/dL 14.6 13.9 14.9  Hematocrit 39.0 - 52.0 % 42.3 39.6(L) 42.1  Platelets 150 - 400 K/uL 232 194 214   CMP Latest Ref Rng & Units 01/17/2019 01/17/2019 11/29/2018  Glucose 70 - 99 mg/dL 177(H) 125(H) 139(H)  BUN 6 - 20 mg/dL 16 14 15   Creatinine 0.61 - 1.24 mg/dL 0.70 0.70 0.81  Sodium 135 - 145 mmol/L 138 137 139  Potassium 3.5 - 5.1 mmol/L 3.6 4.2 4.5  Chloride 98 - 111 mmol/L 101 104 104  CO2 22 - 32 mmol/L 28 26 30   Calcium 8.9 - 10.3 mg/dL 9.0 8.8(L) 9.3  Total Protein 6.5 - 8.1 g/dL 7.1 6.8 6.5  Total Bilirubin 0.3 - 1.2 mg/dL 1.0 1.1 1.0  Alkaline Phos 38 - 126 U/L 142(H) 138(H) -  AST 15 - 41 U/L 16 16 18   ALT 0 - 44 U/L 19 19 22    HbA1C (11/29/2018): 8.6  Imaging studies:  Limited RUQ Abdominal Ultrasound (01/17/2019) 9 mm gallstone, mobile. No wall thickening.  Common bile duct diameter: Normal caliber, 3 mm.  Assessment/Plan:  59 y.o. male with leukocytosis, RUQ abdominal pain, and history consistent with acute calculous cholecystitis despite gallbladder wall thickening or pericholecystic fluid not demonstrated on recent abdominal ultrasound, complicated by co-morbidities including poorly controlled DM now on insulin, HTN, HLD, CAD s/p PCI with stents x2 for MI's (2005, 2017) on chronic dual antiplatelet therapy, 3.4 cm AAA (11/2018), COPD, renal cyst, biopsy-demonstrated primary sclerosing cholangitis, and chronic ongoing tobacco abuse (smoking).              - avoid/minimize foods with higher fat content (meats, cheeses/dairy, and fried)             - prefer low-fat vegetables, whole grains (wheat bread, ceareals, etc), and fruits until cholecystectomy              - all risks, benefits, and alternatives to cholecystectomy were discussed with the patient, all of his questions were answered to patient's expressed satisfaction, patient expresses he wishes to proceed, and informed consent was obtained.             - would have proceeded with  cholecystectomy while on call tomorrow (all scheduled urgent surgeries currently restricted to call day due to ongoing Covid-19 pandemic restrictions), but patient last took Plavix and aspirin yesterday, so will instead plan for laparoscopic cholecystectomy on Wednesday, 5/6 pending cardiology evaluation and approval to hold Plavix x7 days prior to surgery, though anticipate will likely resume the day or two following surgery if no contraindication  - risks of smoking, particularly in context of poorly controlled DM, were discussed and absolute cessation of smoking strongly encouraged             - anticipate return to clinic 2 weeks after above planned surgery             - instructed to call if any questions or concerns  All of the above recommendations were discussed with the patient, and all of patient's questions were answered to his expressed satisfaction.  Thank you for the opportunity to participate in this patient's care.  -- Marilynne Drivers Rosana Hoes, MD, [facility]: [facility] - Partnering for exceptional care. Office: [phone]

## 2019-01-18 NOTE — Telephone Encounter (Signed)
Patient called the office and said that Dr. Sanda Klein wanting patient to be seen.   The patient states he was in the ED last night.   Note to Dr. Rosana Hoes to see if he can see patient today.

## 2019-01-22 ENCOUNTER — Encounter: Payer: Self-pay | Admitting: Surgery

## 2019-01-22 ENCOUNTER — Telehealth: Payer: Self-pay

## 2019-01-22 NOTE — Telephone Encounter (Signed)
Patient scheduled for a pre admit appointment at [facility] on 01/25/19 at 9:00 am. Message left for the patient to call to get this appointment information.

## 2019-01-23 ENCOUNTER — Telehealth: Payer: Self-pay | Admitting: *Deleted

## 2019-01-23 NOTE — Telephone Encounter (Signed)
Patient contacted this morning and notified of Pre-admit appointment scheduled for 01-25-19 at 9 am. The patient is aware to report to the [facility] due to COVID-19 restrictions.   The patient is also scheduled for an appointment for cardiac clearance for 01-25-19 at 2:45 pm with Dr. Serafina Royals. Patient aware that they will need to confirm he can stop Plavix (originally was supposed to take last dose on 01-24-19 and hold 01-25-19 until told to resume after surgery) and also aware they need to fax clearance to our office. Patient verbalizes understanding.

## 2019-01-23 NOTE — Telephone Encounter (Signed)
-----   Message from [facility], LPN sent at 10/08/1622  1:24 PM EDT ----- Called patient and left message for him to call back to get Pre Admit date and time. Have not seen a cardiac clearance yet.  ----- Message ----- From: Dominga Ferry, CMA Sent: 01/18/2019   3:44 PM EDT To: Lesly Rubenstein, LPN  Patient to have surgery on 01-31-19 with Dr. Rosana Hoes. Since pre-admit has reduced hours they did not get his pre-admit appt arranged today. Can you please call the patient tomorrow with the information? He also needs cardiac clearance and approval to stop his Plavix 7 days prior. Just wanted you to be aware in case it comes over while I'm not here. Freda Munro did tell him we would call to notify him once we received clearance. Thanks.

## 2019-01-25 ENCOUNTER — Other Ambulatory Visit: Payer: Self-pay

## 2019-01-25 ENCOUNTER — Encounter
Admission: RE | Admit: 2019-01-25 | Discharge: 2019-01-25 | Disposition: A | Discharge status: Home / Self Care | Payer: BLUE CROSS/BLUE SHIELD | Source: Ambulatory Visit | Attending: Surgery | Admitting: Surgery

## 2019-01-25 HISTORY — DX: Personal history of other diseases of the digestive system: Z87.19

## 2019-01-25 HISTORY — DX: Personal history of urinary calculi: Z87.442

## 2019-01-25 HISTORY — DX: Pneumonia, unspecified organism: J18.9

## 2019-01-25 NOTE — Patient Instructions (Addendum)
Your procedure is scheduled on: 01-31-19 [facility] Report to Same Day Surgery 2nd floor medical mall [facility] Entrance-take elevator on left to 2nd floor.  Check in with surgery information desk.) To find out your arrival time please call [phone] between 1PM - 3PM on 01-30-19 TUESDAY  Remember: Instructions that are not followed completely may result in serious medical risk, up to and including death, or upon the discretion of your surgeon and anesthesiologist your surgery may need to be rescheduled.    _x___ 1. Do not eat food after midnight the night before your procedure. NO GUM OR CANDY AFTER MIDNIGHT. You may drink WATER up to 2 hours before you are scheduled to arrive at the hospital for your procedure.  Do not drink WATER within 2 hours of your scheduled arrival to the hospital.  Type 1 and type 2 diabetics should only drink water.   ____Ensure clear carbohydrate drink on the way to the hospital for bariatric patients  ____Ensure clear carbohydrate drink 3 hours before surgery for Dr Dwyane Luo patients if physician instructed.     __x__ 2. No Alcohol for 24 hours before or after surgery.   __x__3. No Smoking or e-cigarettes for 24 prior to surgery.  Do not use any chewable tobacco products for at least 6 hour prior to surgery   ____  4. Bring all medications with you on the day of surgery if instructed.    __x__ 5. Notify your doctor if there is any change in your medical condition     (cold, fever, infections).    x___6. On the morning of surgery brush your teeth with toothpaste and water.  You may rinse your mouth with mouth wash if you wish.  Do not swallow any toothpaste or mouthwash.   Do not wear jewelry, make-up, hairpins, clips or nail polish.  Do not wear lotions, powders, or perfumes. You may wear deodorant.  Do not shave 48 hours prior to surgery. Men may shave face and neck.  Do not bring valuables to the hospital.    [facility] is not responsible for any  belongings or valuables.               Contacts, dentures or bridgework may not be worn into surgery.  Leave your suitcase in the car. After surgery it may be brought to your room.  For patients admitted to the hospital, discharge time is determined by your treatment team.  _  Patients discharged the day of surgery will not be allowed to drive home.  You will need someone to drive you home and stay with you the night of your procedure.    Please read over the following fact sheets that you were given:   [facility] Preparing for Surgery   _x___ TAKE THE FOLLOWING MEDICATION THE MORNING OF SURGERY WITH A SMALL SIP OF WATER. These include:  1. METOPROLOL  2. PROTONIX  3.  4.  5.  6.  ____Fleets enema or Magnesium Citrate as directed.   _x___ Use CHG Soap or sage wipes as directed on instruction sheet   _X___ Use inhalers on the day of surgery and bring to hospital day of surgery-USE ALBUTEROL INHALER DAY OF SURGERY AND BRING TO HOSPITAL  _X___ Stop Metformin 2 days prior to surgery-LAST DOSE ON Sunday, MAY 3RD   _X___ Take 1/2 of usual insulin dose the night before surgery and none on the morning surgery-TAKE 6 UNITS OF LANTUS Tuesday NIGHT AND NO INSULIN AM OF SURGERY  _x___ Follow recommendations from Cardiologist, Pulmonologist or PCP regarding stopping Aspirin, Coumadin, Plavix ,Eliquis, Effient, or Pradaxa, and Pletal-PT STOPPED PLAVIX AND ASPIRIN 01-23-19  X____Stop Anti-inflammatories such as Advil, Aleve, Ibuprofen, Motrin, Naproxen, Naprosyn, Goodies powders or aspirin products NOW-OK to take Tylenol    ____ Stop supplements until after surgery.     ____ Bring C-Pap to the hospital.

## 2019-01-26 ENCOUNTER — Telehealth: Payer: Self-pay | Admitting: *Deleted

## 2019-01-26 NOTE — Telephone Encounter (Signed)
Patient contacted today and notified that we have received cardiac clearance from Dr. Alveria Apley office. He has placed patient at low risk for surgery.   The patient confirms that he has already stopped Plavix.  Copy of cardiac clearance has been forwarded to the Pre-admission Testing Department today and fax confirmation received.   Surgery as scheduled for 01-31-19 at [facility] with Dr. Rosana Hoes.

## 2019-01-31 ENCOUNTER — Encounter: Payer: Self-pay | Admitting: Surgery

## 2019-01-31 ENCOUNTER — Ambulatory Visit
Admission: RE | Admit: 2019-01-31 | Discharge: 2019-01-31 | Disposition: A | Discharge status: Home / Self Care | Payer: BLUE CROSS/BLUE SHIELD | Attending: Surgery | Admitting: Surgery

## 2019-01-31 ENCOUNTER — Ambulatory Visit: Payer: BLUE CROSS/BLUE SHIELD | Admitting: Anesthesiology

## 2019-01-31 ENCOUNTER — Other Ambulatory Visit: Payer: Self-pay

## 2019-01-31 ENCOUNTER — Encounter
Admission: RE | Disposition: A | Discharge status: Home / Self Care | Payer: Self-pay | Source: Home / Self Care | Attending: Surgery

## 2019-01-31 DIAGNOSIS — Z955 Presence of coronary angioplasty implant and graft: Secondary | ICD-10-CM | POA: Insufficient documentation

## 2019-01-31 DIAGNOSIS — K219 Gastro-esophageal reflux disease without esophagitis: Secondary | ICD-10-CM | POA: Insufficient documentation

## 2019-01-31 DIAGNOSIS — K810 Acute cholecystitis: Secondary | ICD-10-CM | POA: Diagnosis present

## 2019-01-31 DIAGNOSIS — E119 Type 2 diabetes mellitus without complications: Secondary | ICD-10-CM | POA: Insufficient documentation

## 2019-01-31 DIAGNOSIS — I10 Essential (primary) hypertension: Secondary | ICD-10-CM | POA: Insufficient documentation

## 2019-01-31 DIAGNOSIS — Z794 Long term (current) use of insulin: Secondary | ICD-10-CM | POA: Insufficient documentation

## 2019-01-31 DIAGNOSIS — K8000 Calculus of gallbladder with acute cholecystitis without obstruction: Secondary | ICD-10-CM

## 2019-01-31 DIAGNOSIS — F17210 Nicotine dependence, cigarettes, uncomplicated: Secondary | ICD-10-CM | POA: Insufficient documentation

## 2019-01-31 DIAGNOSIS — K8301 Primary sclerosing cholangitis: Secondary | ICD-10-CM | POA: Insufficient documentation

## 2019-01-31 DIAGNOSIS — Z79899 Other long term (current) drug therapy: Secondary | ICD-10-CM | POA: Diagnosis not present

## 2019-01-31 DIAGNOSIS — K8010 Calculus of gallbladder with chronic cholecystitis without obstruction: Secondary | ICD-10-CM | POA: Diagnosis not present

## 2019-01-31 DIAGNOSIS — K7460 Unspecified cirrhosis of liver: Secondary | ICD-10-CM | POA: Diagnosis not present

## 2019-01-31 DIAGNOSIS — Z7982 Long term (current) use of aspirin: Secondary | ICD-10-CM | POA: Insufficient documentation

## 2019-01-31 DIAGNOSIS — E785 Hyperlipidemia, unspecified: Secondary | ICD-10-CM | POA: Insufficient documentation

## 2019-01-31 DIAGNOSIS — Z7902 Long term (current) use of antithrombotics/antiplatelets: Secondary | ICD-10-CM | POA: Diagnosis not present

## 2019-01-31 DIAGNOSIS — I252 Old myocardial infarction: Secondary | ICD-10-CM | POA: Diagnosis not present

## 2019-01-31 DIAGNOSIS — I2510 Atherosclerotic heart disease of native coronary artery without angina pectoris: Secondary | ICD-10-CM | POA: Insufficient documentation

## 2019-01-31 DIAGNOSIS — J449 Chronic obstructive pulmonary disease, unspecified: Secondary | ICD-10-CM | POA: Diagnosis not present

## 2019-01-31 HISTORY — PX: CHOLECYSTECTOMY: SHX55

## 2019-01-31 LAB — GLUCOSE, CAPILLARY
Glucose-Capillary: 159 mg/dL — ABNORMAL HIGH (ref 70–99)
Glucose-Capillary: 181 mg/dL — ABNORMAL HIGH (ref 70–99)

## 2019-01-31 SURGERY — LAPAROSCOPIC CHOLECYSTECTOMY
Anesthesia: General

## 2019-01-31 MED ORDER — ONDANSETRON HCL 4 MG/2ML IJ SOLN
4.0000 mg | Freq: Once | INTRAMUSCULAR | Status: AC
  Administered 2019-01-31: 4 mg via INTRAVENOUS

## 2019-01-31 MED ORDER — MIDAZOLAM HCL 2 MG/2ML IJ SOLN
INTRAMUSCULAR | Status: AC
  Filled 2019-01-31: qty 2

## 2019-01-31 MED ORDER — LIDOCAINE HCL 1 % IJ SOLN
INTRAMUSCULAR | Status: DC | PRN
  Administered 2019-01-31: 20 mL via SUBCUTANEOUS

## 2019-01-31 MED ORDER — SUCCINYLCHOLINE CHLORIDE 20 MG/ML IJ SOLN
INTRAMUSCULAR | Status: DC | PRN
  Administered 2019-01-31: 120 mg via INTRAVENOUS

## 2019-01-31 MED ORDER — FENTANYL CITRATE (PF) 100 MCG/2ML IJ SOLN
INTRAMUSCULAR | Status: AC
  Filled 2019-01-31: qty 2

## 2019-01-31 MED ORDER — LIDOCAINE HCL (PF) 1 % IJ SOLN
INTRAMUSCULAR | Status: AC
  Filled 2019-01-31: qty 30

## 2019-01-31 MED ORDER — OXYCODONE-ACETAMINOPHEN 5-325 MG PO TABS: 1.0000 | ORAL_TABLET | ORAL | 0 refills | Status: DC | PRN

## 2019-01-31 MED ORDER — OXYCODONE HCL 5 MG PO TABS: 5.0000 mg | ORAL_TABLET | Freq: Once | ORAL | Status: DC | PRN

## 2019-01-31 MED ORDER — GABAPENTIN 300 MG PO CAPS
ORAL_CAPSULE | ORAL | Status: AC
  Administered 2019-01-31: 300 mg
  Filled 2019-01-31: qty 1

## 2019-01-31 MED ORDER — BUPIVACAINE HCL (PF) 0.5 % IJ SOLN
INTRAMUSCULAR | Status: AC
  Filled 2019-01-31: qty 30

## 2019-01-31 MED ORDER — MEPERIDINE HCL 50 MG/ML IJ SOLN: 6.2500 mg | INTRAMUSCULAR | Status: DC | PRN

## 2019-01-31 MED ORDER — OXYCODONE HCL 5 MG/5ML PO SOLN: 5.0000 mg | Freq: Once | ORAL | Status: DC | PRN

## 2019-01-31 MED ORDER — CEFAZOLIN SODIUM-DEXTROSE 2-4 GM/100ML-% IV SOLN
INTRAVENOUS | Status: AC
  Filled 2019-01-31: qty 100

## 2019-01-31 MED ORDER — LIDOCAINE HCL (CARDIAC) PF 100 MG/5ML IV SOSY
PREFILLED_SYRINGE | INTRAVENOUS | Status: DC | PRN
  Administered 2019-01-31: 100 mg via INTRAVENOUS

## 2019-01-31 MED ORDER — CHLORHEXIDINE GLUCONATE CLOTH 2 % EX PADS: 6.0000 | MEDICATED_PAD | Freq: Once | CUTANEOUS | Status: DC

## 2019-01-31 MED ORDER — FENTANYL CITRATE (PF) 250 MCG/5ML IJ SOLN
INTRAMUSCULAR | Status: AC
  Filled 2019-01-31: qty 5

## 2019-01-31 MED ORDER — ROCURONIUM BROMIDE 100 MG/10ML IV SOLN
INTRAVENOUS | Status: DC | PRN
  Administered 2019-01-31: 10 mg via INTRAVENOUS
  Administered 2019-01-31: 40 mg via INTRAVENOUS

## 2019-01-31 MED ORDER — FENTANYL CITRATE (PF) 100 MCG/2ML IJ SOLN
INTRAMUSCULAR | Status: DC | PRN
  Administered 2019-01-31 (×4): 50 ug via INTRAVENOUS

## 2019-01-31 MED ORDER — PROPOFOL 10 MG/ML IV BOLUS
INTRAVENOUS | Status: AC
  Filled 2019-01-31: qty 20

## 2019-01-31 MED ORDER — KETOROLAC TROMETHAMINE 30 MG/ML IJ SOLN
INTRAMUSCULAR | Status: DC | PRN
  Administered 2019-01-31: 30 mg via INTRAVENOUS

## 2019-01-31 MED ORDER — ACETAMINOPHEN 500 MG PO TABS: 1000.0000 mg | ORAL_TABLET | ORAL | Status: DC

## 2019-01-31 MED ORDER — ACETAMINOPHEN 500 MG PO TABS
ORAL_TABLET | ORAL | Status: AC
  Administered 2019-01-31: 500 mg
  Filled 2019-01-31: qty 2

## 2019-01-31 MED ORDER — ONDANSETRON HCL 4 MG/2ML IJ SOLN
INTRAMUSCULAR | Status: AC
  Administered 2019-01-31: 4 mg via INTRAVENOUS
  Filled 2019-01-31: qty 2

## 2019-01-31 MED ORDER — MIDAZOLAM HCL 2 MG/2ML IJ SOLN
INTRAMUSCULAR | Status: DC | PRN
  Administered 2019-01-31: 2 mg via INTRAVENOUS

## 2019-01-31 MED ORDER — SODIUM CHLORIDE 0.9 % IV SOLN: INTRAVENOUS | Status: DC

## 2019-01-31 MED ORDER — FENTANYL CITRATE (PF) 100 MCG/2ML IJ SOLN: 25.0000 ug | INTRAMUSCULAR | Status: DC | PRN

## 2019-01-31 MED ORDER — SODIUM CHLORIDE 0.9 % IV SOLN
INTRAVENOUS | Status: DC | PRN
  Administered 2019-01-31: 10:00:00 via INTRAVENOUS

## 2019-01-31 MED ORDER — CEFAZOLIN SODIUM-DEXTROSE 2-4 GM/100ML-% IV SOLN
2.0000 g | INTRAVENOUS | Status: AC
  Administered 2019-01-31: 2 g via INTRAVENOUS

## 2019-01-31 MED ORDER — PHENYLEPHRINE HCL (PRESSORS) 10 MG/ML IV SOLN
INTRAVENOUS | Status: DC | PRN
  Administered 2019-01-31: 100 ug via INTRAVENOUS

## 2019-01-31 MED ORDER — GABAPENTIN 300 MG PO CAPS: 300.0000 mg | ORAL_CAPSULE | ORAL | Status: DC

## 2019-01-31 MED ORDER — DEXAMETHASONE SODIUM PHOSPHATE 10 MG/ML IJ SOLN
INTRAMUSCULAR | Status: DC | PRN
  Administered 2019-01-31: 10 mg via INTRAVENOUS

## 2019-01-31 MED ORDER — ONDANSETRON HCL 4 MG/2ML IJ SOLN
INTRAMUSCULAR | Status: DC | PRN
  Administered 2019-01-31: 4 mg via INTRAVENOUS

## 2019-01-31 MED ORDER — PROPOFOL 10 MG/ML IV BOLUS
INTRAVENOUS | Status: DC | PRN
  Administered 2019-01-31: 150 mg via INTRAVENOUS

## 2019-01-31 MED ORDER — PROMETHAZINE HCL 25 MG/ML IJ SOLN: 6.2500 mg | INTRAMUSCULAR | Status: DC | PRN

## 2019-01-31 SURGICAL SUPPLY — 36 items
APPLIER CLIP ROT 10 11.4 M/L (STAPLE) ×2
CHLORAPREP W/TINT 26 (MISCELLANEOUS) ×2 IMPLANT
CLIP APPLIE ROT 10 11.4 M/L (STAPLE) ×1 IMPLANT
COVER WAND RF STERILE (DRAPES) ×2 IMPLANT
DECANTER SPIKE VIAL GLASS SM (MISCELLANEOUS) IMPLANT
DERMABOND ADVANCED (GAUZE/BANDAGES/DRESSINGS) ×1
DERMABOND ADVANCED .7 DNX12 (GAUZE/BANDAGES/DRESSINGS) ×1 IMPLANT
DRESSING SURGICEL FIBRLLR 1X2 (HEMOSTASIS) IMPLANT
DRSG SURGICEL FIBRILLAR 1X2 (HEMOSTASIS)
ELECT REM PT RETURN 9FT ADLT (ELECTROSURGICAL) ×2
ELECTRODE REM PT RTRN 9FT ADLT (ELECTROSURGICAL) ×1 IMPLANT
GLOVE BIO SURGEON STRL SZ7 (GLOVE) ×10 IMPLANT
GLOVE BIOGEL PI IND STRL 7.5 (GLOVE) ×1 IMPLANT
GLOVE BIOGEL PI INDICATOR 7.5 (GLOVE) ×1
GOWN STRL REUS W/ TWL LRG LVL3 (GOWN DISPOSABLE) ×3 IMPLANT
GOWN STRL REUS W/TWL LRG LVL3 (GOWN DISPOSABLE) ×3
GRASPER SUT TROCAR 14GX15 (MISCELLANEOUS) ×2 IMPLANT
IRRIGATION STRYKERFLOW (MISCELLANEOUS) IMPLANT
IRRIGATOR STRYKERFLOW (MISCELLANEOUS)
IV NS 1000ML (IV SOLUTION)
IV NS 1000ML BAXH (IV SOLUTION) IMPLANT
KIT TURNOVER KIT A (KITS) ×2 IMPLANT
LABEL OR SOLS (LABEL) ×2 IMPLANT
NEEDLE HYPO 22GX1.5 SAFETY (NEEDLE) ×2 IMPLANT
NEEDLE INSUFFLATION 14GA 120MM (NEEDLE) ×2 IMPLANT
NS IRRIG 1000ML POUR BTL (IV SOLUTION) ×2 IMPLANT
PACK LAP CHOLECYSTECTOMY (MISCELLANEOUS) ×2 IMPLANT
POUCH SPECIMEN RETRIEVAL 10MM (ENDOMECHANICALS) ×2 IMPLANT
SCISSORS METZENBAUM CVD 33 (INSTRUMENTS) ×2 IMPLANT
SET TUBE SMOKE EVAC HIGH FLOW (TUBING) ×2 IMPLANT
SLEEVE ENDOPATH XCEL 5M (ENDOMECHANICALS) ×4 IMPLANT
SUT MNCRL AB 4-0 PS2 18 (SUTURE) ×2 IMPLANT
SUT VICRYL 0 UR6 27IN ABS (SUTURE) ×2 IMPLANT
SUT VICRYL AB 3-0 FS1 BRD 27IN (SUTURE) ×2 IMPLANT
TROCAR XCEL 12X100 BLDLESS (ENDOMECHANICALS) ×2 IMPLANT
TROCAR XCEL NON-BLD 5MMX100MML (ENDOMECHANICALS) ×2 IMPLANT

## 2019-01-31 NOTE — Transfer of Care (Signed)
Immediate Anesthesia Transfer of Care Note  Patient: Scott Rosario  Procedure(s) Performed: LAPAROSCOPIC CHOLECYSTECTOMY - DIABETIC (N/A )  Patient Location: PACU  Anesthesia Type:General  Level of Consciousness: sedated  Airway & Oxygen Therapy: Patient Spontanous Breathing and Patient connected to face mask oxygen  Post-op Assessment: Report given to RN and Post -op Vital signs reviewed and stable  Post vital signs: Reviewed and stable  Last Vitals:  Vitals Value Taken Time  BP    Temp    Pulse    Resp    SpO2      Last Pain:  Vitals:   01/31/19 0943  TempSrc: Oral  PainSc: 0-No pain         Complications: No apparent anesthesia complications

## 2019-01-31 NOTE — OR Nursing (Signed)
Unable to correct amt given on tylenol from 500mg  to 1000 mg on MAR; 1000 mg was given as ordered.

## 2019-01-31 NOTE — Discharge Instructions (Addendum)
AMBULATORY SURGERY  DISCHARGE INSTRUCTIONS   1) The drugs that you were given will stay in your system until tomorrow so for the next 24 hours you should not:  A) Drive an automobile B) Make any legal decisions C) Drink any alcoholic beverage   2) You may resume regular meals tomorrow.  Today it is better to start with liquids and gradually work up to solid foods.  You may eat anything you prefer, but it is better to start with liquids, then soup and crackers, and gradually work up to solid foods.   3) Please notify your doctor immediately if you have any unusual bleeding, trouble breathing, redness and pain at the surgery site, drainage, fever, or pain not relieved by medication. 4)   5) Your post-operative visit with Dr.                                     is: Date:                        Time:    Please call to schedule your post-operative visit.  6) Additional Instructions:     In addition to included general post-operative instructions for Laparoscopic Cholecystectomy,  Diet: Resume home heart healthy diet.   Activity: No heavy lifting >15 - 20 pounds (children, pets, laundry, garbage) or strenuous activity until follow-up in 2 weeks, but light activity and walking are encouraged. Do not drive or drink alcohol if taking narcotic pain medications.  Wound care: 2 days after surgery (Friday, 5/8), you may shower/get incision wet with soapy water and pat dry (do not rub incisions), but no baths or submerging incision underwater until follow-up.   Medications: Resume all home medications. Since you have not been taking Plavix x 2 years (as stated this morning), please call your cardiologist for instructions regarding restarting Plavix (okay to resume from surgical perspective). For mild to moderate pain: acetaminophen (Tylenol) or ibuprofen/naproxen (if no kidney disease). Combining Tylenol with alcohol can substantially increase your risk of causing liver disease. Narcotic pain  medications, if prescribed, can be used for severe pain, though may cause nausea, constipation, and drowsiness. Do not combine Tylenol and Percocet (or similar) within a 6 hour period as Percocet (and similar) contain(s) Tylenol. If you do not need the narcotic pain medication, you do not need to fill the prescription.  Call office [phone]) at any time if any questions, worsening pain, fevers/chills, bleeding, drainage from incision site, or other concerns.

## 2019-01-31 NOTE — Anesthesia Post-op Follow-up Note (Signed)
Anesthesia QCDR form completed.        

## 2019-01-31 NOTE — Anesthesia Procedure Notes (Signed)
Procedure Name: Intubation Date/Time: 01/31/2019 10:10 AM Performed by: Emmie Niemann, MD Pre-anesthesia Checklist: Patient identified, Patient being monitored, Timeout performed, Emergency Drugs available and Suction available Patient Re-evaluated:Patient Re-evaluated prior to induction Oxygen Delivery Method: Circle system utilized Preoxygenation: Pre-oxygenation with 100% oxygen Induction Type: IV induction Ventilation: Mask ventilation without difficulty Laryngoscope Size: 3 and McGraph Grade View: Grade I Tube type: Oral Tube size: 7.5 mm Number of attempts: 1 Airway Equipment and Method: Stylet Placement Confirmation: ETT inserted through vocal cords under direct vision,  positive ETCO2 and breath sounds checked- equal and bilateral Secured at: 23 cm Tube secured with: Tape Dental Injury: Teeth and Oropharynx as per pre-operative assessment

## 2019-01-31 NOTE — Interval H&P Note (Signed)
History and Physical Interval Note:  01/31/2019 9:43 AM  Scott Rosario  has presented today for surgery, with the diagnosis of CHOLECYSTECTOMY.  The various methods of treatment have been discussed with the patient and family. After consideration of risks, benefits and other options for treatment, the patient has consented to  Procedure(s): LAPAROSCOPIC CHOLECYSTECTOMY - DIABETIC (N/A) as a surgical intervention.  The patient's history has been reviewed, patient examined, no change in status, stable for surgery.  I have reviewed the patient's chart and labs.  Questions were answered to the patient's satisfaction.     Vickie Epley

## 2019-01-31 NOTE — OR Nursing (Signed)
No laser used. Box was checked by mistake.

## 2019-01-31 NOTE — Anesthesia Preprocedure Evaluation (Signed)
Anesthesia Evaluation  Patient identified by MRN, date of birth, ID band Patient awake    Reviewed: Allergy & Precautions, NPO status , Patient's Chart, lab work & pertinent test results  History of Anesthesia Complications Negative for: history of anesthetic complications  Airway Mallampati: I  TM Distance: >3 FB Neck ROM: Full    Dental  (+) Poor Dentition, Missing   Pulmonary asthma , COPD,  COPD inhaler, Current Smoker,    breath sounds clear to auscultation- rhonchi (-) wheezing      Cardiovascular hypertension, Pt. on medications (-) angina+ CAD, + Past MI and + Cardiac Stents   Rhythm:Regular Rate:Normal - Systolic murmurs and - Diastolic murmurs    Neuro/Psych neg Seizures negative neurological ROS  negative psych ROS   GI/Hepatic hiatal hernia, GERD  ,(+) Cirrhosis       ,   Endo/Other  diabetes, Insulin Dependent  Renal/GU negative Renal ROS     Musculoskeletal negative musculoskeletal ROS (+)   Abdominal (+) - obese,   Peds  Hematology negative hematology ROS (+)   Anesthesia Other Findings Past Medical History: No date: Alkaline phosphatase elevation 12/10/2018: Aortic aneurysm (HCC)     Comment:  3.4 cm March 2020 No date: Aortic atherosclerosis (HCC)     Comment:  on CT 02/25/14 No date: Asthma No date: CAD (coronary artery disease) No date: Cirrhosis ([place]) No date: COPD (chronic obstructive pulmonary disease) (HCC) No date: Diabetes mellitus without complication (HCC) No date: Emphysema of lung (HCC) No date: GERD (gastroesophageal reflux disease) No date: Heart attack [facility])     Comment:  2005, 2017 (stent after each) No date: History of hiatal hernia     Comment:  small No date: History of kidney stones     Comment:  h/o No date: Hyperlipidemia No date: Hypertension 2018: Pneumonia No date: Pulmonary nodules     Comment:  x 3, (69mm, 55mm, 58mm) chest CT February 25, 2014 No date: Renal  cyst     Comment:  on CT 02/25/14 No date: Tobacco use   Reproductive/Obstetrics                             Anesthesia Physical Anesthesia Plan  ASA: III  Anesthesia Plan: General   Post-op Pain Management:    Induction: Intravenous  PONV Risk Score and Plan: 0 and Ondansetron and Midazolam  Airway Management Planned: Oral ETT  Additional Equipment:   Intra-op Plan:   Post-operative Plan: Extubation in OR  Informed Consent: I have reviewed the patients History and Physical, chart, labs and discussed the procedure including the risks, benefits and alternatives for the proposed anesthesia with the patient or authorized representative who has indicated his/her understanding and acceptance.     Dental advisory given  Plan Discussed with: CRNA and Anesthesiologist  Anesthesia Plan Comments:         Anesthesia Quick Evaluation

## 2019-01-31 NOTE — Op Note (Signed)
SURGICAL OPERATIVE REPORT   DATE OF PROCEDURE: 01/31/2019  ATTENDING Surgeon(s): Vickie Epley, MD  ANESTHESIA: GETA  PRE-OPERATIVE DIAGNOSIS: Resolved acute cholecystitis (K80.00)  POST-OPERATIVE DIAGNOSIS: Resolved acute cholecystitis (K80.00)  PROCEDURE(S): (cpt's: 58527) 1.) Laparoscopic Cholecystectomy  INTRAOPERATIVE FINDINGS: Minimal pericholecystic inflammation with cystic duct and cystic artery clips well-secured, hemostasis at completion of procedure  INTRAOPERATIVE FLUIDS: 600 mL crystalloid   ESTIMATED BLOOD LOSS: Minimal (<30 mL)   URINE OUTPUT: No foley  SPECIMENS: Gallbladder  IMPLANTS: None  DRAINS: None   COMPLICATIONS: None apparent   CONDITION AT COMPLETION: Hemodynamically stable and extubated  DISPOSITION: PACU   INDICATION(S) FOR PROCEDURE:  Patient is a 58 y.o. male who recently presented with RUQ > epigastric abdominal pain, worse after eating fatty foods in particular and was diagnosed with acute cholecystitis. At that time, he reported taking Plavix 75 mg once daily following PCI with coronary stents, and cardiology evaluation for discontinuation of Plavix in particular, was requested. Since that time, patient's pain has improved accordingly. This morning, prior to his surgery, patient recalled he just realized he hasn't taken Plavix in 2 years, though another medication he is taking was Plavix. All risks, benefits, and alternatives to above semi-elective procedures were discussed with the patient, who elected to proceed, and informed consent was accordingly obtained at that time.   DETAILS OF PROCEDURE:  Patient was brought to the operating suite and appropriately identified. General anesthesia was administered along with peri-operative prophylactic IV antibiotics, and endotracheal intubation was performed by anesthesiologist. In supine position, operative site was prepped and draped in usual sterile fashion, and following a brief time out,  initial 5 mm incision was made in a natural skin crease just above the umbilicus. Fascia was then elevated, and a Verress needle was inserted and its proper position confirmed using aspiration and saline meniscus test.  Upon insufflation of the abdominal cavity with carbon dioxide to a well-tolerated pressure of 12-15 mmHg, 5 mm peri-umbilical port followed by laparoscope were inserted and used to inspect the abdominal cavity and its contents with no injuries from insertion of the first trochar noted. Three additional trocars were inserted, one at the epigastric position (10 mm) and two along the Right costal margin (5 mm). The table was then placed in reverse Trendelenburg position with the Right side up. Filmy adhesions between the gallbladder and omentum/duodenum/transverse colon were lysed using combined blunt dissection and selective electrocautery. The apex/dome of the gallbladder was grasped with an atraumatic grasper passed through the lateral port and retracted apically over the liver. The infundibulum was also grasped and retracted, exposing Calot's triangle. The peritoneum overlying the gallbladder infundibulum was incised and dissected free of surrounding peritoneal attachments, revealing the cystic duct and cystic artery, which were clipped twice on the patient side and once on the gallbladder specimen side close to the gallbladder. The gallbladder was then dissected from its peritoneal attachments to the liver using electrocautery, and the gallbladder was placed into a laparoscopic specimen bag and removed from the abdominal cavity via the epigastric port site. Hemostasis and secure placement of clips were confirmed, and intra-peritoneal cavity was inspected with no additional findings. PMI laparoscopic fascial closure device was then used to re-approximate fascia at the 10 mm epigastric port site.  All ports were then removed under direct visualization, and abdominal cavity was desuflated. All  port sites were irrigated/cleaned, additional local anesthetic was injected at each incision, 3-0 Vicryl was used to re-approximate dermis at 10 mm port site(s), and subcuticular 4-0  Monocryl suture was used to re-approximate skin. Skin was then cleaned, dried, and sterile skin glue was applied. Patient was then safely able to be awakened, extubated, and transferred to PACU for post-operative monitoring and care.   I was present for all aspects of the above procedure, and no operative complications were apparent.

## 2019-01-31 NOTE — Anesthesia Postprocedure Evaluation (Signed)
Anesthesia Post Note  Patient: Scott Rosario  Procedure(s) Performed: LAPAROSCOPIC CHOLECYSTECTOMY - DIABETIC (N/A )  Patient location during evaluation: PACU Anesthesia Type: General Level of consciousness: awake and alert and oriented Pain management: pain level controlled Vital Signs Assessment: post-procedure vital signs reviewed and stable Respiratory status: spontaneous breathing, nonlabored ventilation and respiratory function stable Cardiovascular status: blood pressure returned to baseline and stable Postop Assessment: no signs of nausea or vomiting Anesthetic complications: no     Last Vitals:  Vitals:   01/31/19 1141 01/31/19 1148  BP:  122/66  Pulse: 68 69  Resp: (!) 23 (!) 24  Temp:    SpO2: 97% 91%    Last Pain:  Vitals:   01/31/19 1148  TempSrc:   PainSc: 0-No pain                 Iliana Hutt

## 2019-02-01 ENCOUNTER — Telehealth: Payer: Self-pay | Admitting: Surgery

## 2019-02-01 LAB — SURGICAL PATHOLOGY

## 2019-02-01 NOTE — Telephone Encounter (Signed)
Patient states he will try   (Advil or Aleve) as needed for comfort .

## 2019-02-01 NOTE — Telephone Encounter (Signed)
Patient is calling said the oxycodone is working for him, he also has been having a  Headache for the past two days. Please call patient and advise.

## 2019-02-02 ENCOUNTER — Encounter: Payer: Self-pay | Admitting: Emergency Medicine

## 2019-02-02 ENCOUNTER — Emergency Department: Payer: BLUE CROSS/BLUE SHIELD

## 2019-02-02 ENCOUNTER — Other Ambulatory Visit: Payer: Self-pay

## 2019-02-02 ENCOUNTER — Emergency Department
Admission: EM | Admit: 2019-02-02 | Discharge: 2019-02-02 | Disposition: A | Discharge status: Home / Self Care | Payer: BLUE CROSS/BLUE SHIELD | Attending: Emergency Medicine | Admitting: Emergency Medicine

## 2019-02-02 DIAGNOSIS — Z794 Long term (current) use of insulin: Secondary | ICD-10-CM | POA: Diagnosis not present

## 2019-02-02 DIAGNOSIS — Z7982 Long term (current) use of aspirin: Secondary | ICD-10-CM | POA: Diagnosis not present

## 2019-02-02 DIAGNOSIS — Z79899 Other long term (current) drug therapy: Secondary | ICD-10-CM | POA: Diagnosis not present

## 2019-02-02 DIAGNOSIS — I10 Essential (primary) hypertension: Secondary | ICD-10-CM | POA: Insufficient documentation

## 2019-02-02 DIAGNOSIS — I2510 Atherosclerotic heart disease of native coronary artery without angina pectoris: Secondary | ICD-10-CM | POA: Insufficient documentation

## 2019-02-02 DIAGNOSIS — E119 Type 2 diabetes mellitus without complications: Secondary | ICD-10-CM | POA: Insufficient documentation

## 2019-02-02 DIAGNOSIS — F17210 Nicotine dependence, cigarettes, uncomplicated: Secondary | ICD-10-CM | POA: Diagnosis not present

## 2019-02-02 DIAGNOSIS — J45909 Unspecified asthma, uncomplicated: Secondary | ICD-10-CM | POA: Diagnosis not present

## 2019-02-02 DIAGNOSIS — R079 Chest pain, unspecified: Secondary | ICD-10-CM | POA: Diagnosis present

## 2019-02-02 DIAGNOSIS — I200 Unstable angina: Secondary | ICD-10-CM | POA: Diagnosis not present

## 2019-02-02 DIAGNOSIS — Z955 Presence of coronary angioplasty implant and graft: Secondary | ICD-10-CM | POA: Diagnosis not present

## 2019-02-02 LAB — TROPONIN I
Troponin I: 0.14 ng/mL (ref <0.03)
Troponin I: 0.18 ng/mL (ref <0.03)

## 2019-02-02 LAB — BASIC METABOLIC PANEL
Anion gap: 10 (ref 5–15)
BUN: 15 mg/dL (ref 6–20)
CO2: 23 mmol/L (ref 22–32)
Calcium: 8.6 mg/dL — ABNORMAL LOW (ref 8.9–10.3)
Chloride: 104 mmol/L (ref 98–111)
Creatinine, Ser: 0.68 mg/dL (ref 0.61–1.24)
GFR calc Af Amer: >60 mL/min (ref >60)
GFR calc non Af Amer: >60 mL/min (ref >60)
Glucose, Bld: 105 mg/dL — ABNORMAL HIGH (ref 70–99)
Potassium: 4 mmol/L (ref 3.5–5.1)
Sodium: 137 mmol/L (ref 135–145)

## 2019-02-02 LAB — CBC
HCT: 41.4 % (ref 39.0–52.0)
Hemoglobin: 14.3 g/dL (ref 13.0–17.0)
MCH: 30 pg (ref 26.0–34.0)
MCHC: 34.5 g/dL (ref 30.0–36.0)
MCV: 86.8 fL (ref 80.0–100.0)
Platelets: 220 10*3/uL (ref 150–400)
RBC: 4.77 MIL/uL (ref 4.22–5.81)
RDW: 13.2 % (ref 11.5–15.5)
WBC: 12.8 10*3/uL — ABNORMAL HIGH (ref 4.0–10.5)
nRBC: 0 % (ref 0.0–0.2)

## 2019-02-02 MED ORDER — ASPIRIN 81 MG PO CHEW
243.0000 mg | CHEWABLE_TABLET | Freq: Once | ORAL | Status: AC
  Administered 2019-02-02: 243 mg via ORAL
  Filled 2019-02-02: qty 3

## 2019-02-02 MED ORDER — ISOSORBIDE MONONITRATE ER 30 MG PO TB24: 30.0000 mg | ORAL_TABLET | Freq: Every day | ORAL | 1 refills | Status: DC

## 2019-02-02 NOTE — ED Notes (Signed)
Pt verbalized understanding of discharge instructions. NAD at this time. 

## 2019-02-02 NOTE — ED Triage Notes (Addendum)
Patient states he was driving approx. 1 hour ago when he began to have chest pain.  Patient states he had galbladder surgery on Wednesday.  Patient states the pain was very sharp.  Patient took 2 sl nitroglycerin which has helped the pain.  Patient reports some pain/numbness down left arm. Patient states, "the pain has eased up a lot."  Patient reports history of 2 heart attacks and states this pain felt very similar.

## 2019-02-02 NOTE — ED Provider Notes (Signed)
[facility] Emergency Department Provider Note  ____________________________________________  Time seen: Approximately 3:33 PM  I have reviewed the triage vital signs and the nursing notes.   HISTORY  Chief Complaint Chest Pain   HPI Scott Rosario is a 58 y.o. male with h/o CAD s/p PCI (last [facility] 2017), COPD, HTN, HLD who presents for evaluation of CP. Patient reports that he was driving when he developed a sharp pain in the center of his chest, severe, associated with SOB, dizziness. He took one nitro with resolution of the pain. The pain then returned, he took another nitro but it did not go away, so patient drove to the ER. Pain similar to prior heart attack. No pain at this time. No SOB, cough, or fever. Patient is s/p cholecystectomy 2 days ago. No complications from the surgery. No leg swelling, hemoptysis, or SOB.  Past Medical History:  Diagnosis Date  . Alkaline phosphatase elevation   . Aortic aneurysm ([place]) 12/10/2018   3.4 cm March 2020  . Aortic atherosclerosis ([place])    on CT 02/25/14  . Asthma   . CAD (coronary artery disease)   . Cirrhosis ([place])   . COPD (chronic obstructive pulmonary disease) ([place])   . Diabetes mellitus without complication ([place])   . Emphysema of lung ([place])   . GERD (gastroesophageal reflux disease)   . Heart attack ([place])    2005, 2017 (stent after each)  . History of hiatal hernia    small  . History of kidney stones    h/o  . Hyperlipidemia   . Hypertension   . Pneumonia 2018  . Pulmonary nodules    x 3, (29mm, 57mm, 75mm) chest CT February 25, 2014  . Renal cyst    on CT 02/25/14  . Tobacco use     Patient Active Problem List   Diagnosis Date Noted  . Aortic aneurysm ([place]) 12/10/2018  . Gallstone 12/10/2018  . Ingrown toenail of both feet 11/29/2018  . History of MI (myocardial infarction) 07/25/2018  . Senile purpura ([place]) 07/25/2018  . Overweight (BMI 25.0-29.9) 12/20/2017  . Gastritis without bleeding    . Diarrhea 10/18/2017  . Acquired trigger finger 02/15/2017  . Snoring 05/07/2016  . Medication monitoring encounter 08/19/2015  . COPD (chronic obstructive pulmonary disease) ([place])   . Emphysema of lung ([place])   . Diabetes type 2, uncontrolled ([place])   . GERD (gastroesophageal reflux disease)   . Hyperlipidemia   . Hypertension   . Tobacco use   . Pulmonary nodules   . CAD (coronary artery disease)   . Cirrhosis ([place])   . Alkaline phosphatase elevation   . Aortic atherosclerosis ([place])   . Multiple pulmonary nodules determined by computed tomography of lung 12/31/2014  . COPD mixed type ([place]) 12/31/2014    Past Surgical History:  Procedure Laterality Date  . APPENDECTOMY    . CARDIAC CATHETERIZATION Left 03/09/2016   Procedure: Left Heart Cath and Coronary Angiography;  Surgeon: Corey Skains, MD;  Location: [facility];  Service: Cardiovascular;  Laterality: Left;  . CARDIAC CATHETERIZATION N/A 03/09/2016   Procedure: Coronary Stent Intervention;  Surgeon: Isaias Cowman, MD;  Location: [facility];  Service: Cardiovascular;  Laterality: N/A;  . CHOLECYSTECTOMY N/A 01/31/2019   Procedure: LAPAROSCOPIC CHOLECYSTECTOMY - DIABETIC;  Surgeon: Vickie Epley, MD;  Location: [facility] ORS;  Service: General;  Laterality: N/A;  . CORONARY ANGIOPLASTY WITH STENT PLACEMENT  10/25/2003   x 2  .  ESOPHAGOGASTRODUODENOSCOPY (EGD) WITH PROPOFOL N/A 10/24/2017   Procedure: ESOPHAGOGASTRODUODENOSCOPY (EGD) WITH PROPOFOL;  Surgeon: Lucilla Lame, MD;  Location: [facility];  Service: Endoscopy;  Laterality: N/A;  Diabetic - oral meds    Prior to Admission medications   Medication Sig Start Date End Date Taking? Authorizing Provider  aspirin EC 81 MG tablet Take 81 mg by mouth daily.   Yes [provider]  atorvastatin (LIPITOR) 80 MG tablet Take 1 tablet (80 mg total) by mouth at bedtime. 12/21/18  Yes Poulose, Bethel Born, NP  glipiZIDE (GLUCOTROL XL) 10 MG 24  hr tablet TAKE 1 TABLET BY MOUTH ONCE DAILY. 12/25/18  Yes Poulose, Bethel Born, NP  Insulin Glargine (LANTUS SOLOSTAR) 100 UNIT/ML Solostar Pen Inject 12 Units into the skin at bedtime. 07/25/18  Yes Poulose, Bethel Born, NP  losartan (COZAAR) 50 MG tablet Take 1 tablet (50 mg total) by mouth daily. Patient taking differently: Take 50 mg by mouth every evening.  12/21/18  Yes Poulose, Bethel Born, NP  metFORMIN (GLUCOPHAGE) 500 MG tablet TAKE 1 TABLET BY MOUTH TWICE DAILY WITH MEALS Patient taking differently: Take 1,000 mg by mouth 2 (two) times daily with a meal.  11/27/18  Yes Poulose, Bethel Born, NP  metoprolol succinate (TOPROL-XL) 25 MG 24 hr tablet Take 1 tablet (25 mg total) by mouth daily. Patient taking differently: Take 25 mg by mouth every morning.  12/21/18  Yes Poulose, Bethel Born, NP  nitroGLYCERIN (NITROSTAT) 0.4 MG SL tablet Place 1 tablet (0.4 mg total) under the tongue every 5 (five) minutes as needed for chest pain. Max of 3 total; call 911 09/22/17  Yes [name], Satira Anis, MD  pantoprazole (PROTONIX) 40 MG tablet Take 1 tablet (40 mg total) by mouth 2 (two) times daily. 12/21/18  Yes Poulose, Bethel Born, NP  ursodiol (ACTIGALL) 300 MG capsule Take 2 capsules (600 mg total) by mouth 3 (three) times daily. Patient taking differently: Take 600 mg by mouth 2 (two) times daily.  06/01/18  Yes Lucilla Lame, MD  albuterol (PROVENTIL HFA;VENTOLIN HFA) 108 (90 Base) MCG/ACT inhaler Inhale 2 puffs into the lungs every 4 (four) hours as needed for wheezing or shortness of breath. 12/21/18   Poulose, Bethel Born, NP  isosorbide mononitrate (IMDUR) 30 MG 24 hr tablet Take 1 tablet (30 mg total) by mouth daily. 02/02/19 02/02/20  Rudene Re, MD  oxyCODONE-acetaminophen (PERCOCET/ROXICET) 5-325 MG tablet Take 1 tablet by mouth every 4 (four) hours as needed for severe pain. Patient not taking: Reported on 02/02/2019 01/31/19   Vickie Epley, MD    Allergies Canagliflozin; Semaglutide; and Tape   Family History  Problem Relation Age of Onset  . Diabetes Mother   . Hypertension Mother   . Arthritis Mother   . Hyperlipidemia Mother   . Heart attack Father   . Epilepsy Father   . Diabetes Father   . Seizures Father   . Heart disease Father   . Hyperlipidemia Father   . Hypertension Father   . Stroke Father   . Diabetes Brother   . Hyperlipidemia Brother   . Hypertension Brother   . Cancer Maternal Grandmother        unknown  . Diabetes Paternal Grandmother   . Heart disease Paternal Grandmother   . Hyperlipidemia Paternal Grandmother   . Hypertension Paternal Grandmother   . Heart attack Paternal Grandfather     Social History Social History   Tobacco Use  . Smoking status: Current Every Day Smoker  Packs/day: 1.00    Years: 45.00    Pack years: 45.00    Types: Cigarettes  . Smokeless tobacco: Never Used  Substance Use Topics  . Alcohol use: No    Alcohol/week: 0.0 standard drinks  . Drug use: No    Review of Systems  Constitutional: Negative for fever. Eyes: Negative for visual changes. ENT: Negative for sore throat. Neck: No neck pain  Cardiovascular: + chest pain. Respiratory: Negative for shortness of breath. Gastrointestinal: Negative for abdominal pain, vomiting or diarrhea. Genitourinary: Negative for dysuria. Musculoskeletal: Negative for back pain. Skin: Negative for rash. Neurological: Negative for headaches, weakness or numbness. Psych: No SI or HI  ____________________________________________   PHYSICAL EXAM:  VITAL SIGNS: ED Triage Vitals  Enc Vitals Group     BP 02/02/19 1456 132/83     Pulse Rate 02/02/19 1456 74     Resp 02/02/19 1456 16     Temp 02/02/19 1456 98.1 F (36.7 C)     Temp Source 02/02/19 1456 Oral     SpO2 02/02/19 1456 97 %     Weight 02/02/19 1457 187 lb (84.8 kg)     Height 02/02/19 1457 5\' 8"  (1.727 m)     Head Circumference --      Peak Flow --      Pain Score 02/02/19 1456 3     Pain Loc --       Pain Edu? --      Excl. in [place]? --     Constitutional: Alert and oriented. Well appearing and in no apparent distress. HEENT:      Head: Normocephalic and atraumatic.         Eyes: Conjunctivae are normal. Sclera is non-icteric.       Mouth/Throat: Mucous membranes are moist.       Neck: Supple with no signs of meningismus. Cardiovascular: Regular rate and rhythm. No murmurs, gallops, or rubs. 2+ symmetrical distal pulses are present in all extremities. No JVD. Respiratory: Normal respiratory effort. Lungs are clear to auscultation bilaterally. No wheezes, crackles, or rhonchi.  Gastrointestinal: Soft, non tender, and non distended with positive bowel sounds. No rebound or guarding. Musculoskeletal: Nontender with normal range of motion in all extremities. No edema, cyanosis, or erythema of extremities. Neurologic: Normal speech and language. Face is symmetric. Moving all extremities. No gross focal neurologic deficits are appreciated. Skin: Skin is warm, dry and intact. No rash noted. Psychiatric: Mood and affect are normal. Speech and behavior are normal.  ____________________________________________   LABS (all labs ordered are listed, but only abnormal results are displayed)  Labs Reviewed  BASIC METABOLIC PANEL - Abnormal; Notable for the following components:      Result Value   Glucose, Bld 105 (*)    Calcium 8.6 (*)    All other components within normal limits  CBC - Abnormal; Notable for the following components:   WBC 12.8 (*)    All other components within normal limits  TROPONIN I - Abnormal; Notable for the following components:   Troponin I 0.14 (*)    All other components within normal limits  TROPONIN I - Abnormal; Notable for the following components:   Troponin I 0.18 (*)    All other components within normal limits   ____________________________________________  EKG  ED ECG REPORT I, Rudene Re, the attending physician, personally viewed and  interpreted this ECG.  NSR, rate 76, normal intervals, normal axis, no STE or depressions. Unchanged from prior ____________________________________________  RADIOLOGY  I have personally reviewed the images performed during this visit and I agree with the Radiologist's read.   Interpretation by Radiologist:  Dg Chest 2 View  Result Date: 02/02/2019 CLINICAL DATA:  Chest pain. EXAM: CHEST - 2 VIEW COMPARISON:  Chest x-ray dated 09/22/2017 and CT scans of the chest dated 02/24/2017, 03/05/2016 and 02/01/2015 FINDINGS: The heart size and pulmonary vascularity are normal. Stable small nodule at the left lung base. No infiltrates or effusions. The lungs are hyperinflated with flattening of the diaphragm consistent with emphysema demonstrated on the prior chest CTs. No acute bone abnormality. IMPRESSION: No acute abnormality.  Stable small nodule at the left lung base. Emphysema. Electronically Signed   By: Lorriane Shire M.D.   On: 02/02/2019 15:33     ____________________________________________   PROCEDURES  Procedure(s) performed: None Procedures Critical Care performed: yes  CRITICAL CARE Performed by: Rudene Re  ?  Total critical care time: 30 min  Critical care time was exclusive of separately billable procedures and treating other patients.  Critical care was necessary to treat or prevent imminent or life-threatening deterioration.  Critical care was time spent personally by me on the following activities: development of treatment plan with patient and/or surrogate as well as nursing, discussions with consultants, evaluation of patient's response to treatment, examination of patient, obtaining history from patient or surrogate, ordering and performing treatments and interventions, ordering and review of laboratory studies, ordering and review of radiographic studies, pulse oximetry and re-evaluation of patient's condition.  ____________________________________________    INITIAL IMPRESSION / ASSESSMENT AND PLAN / ED COURSE   58 y.o. male with h/o CAD s/p PCI (last [facility] 2017), COPD, HTN, HLD who presents for evaluation of CP. High risk CP, presentation concerning for ACS. Pain free now. EKG with no acute ischemic changes. First troponin elevated at 0.14. Recommended admission for NSTEMI. Patient adamant about going home. Patient asked for me to reach out to his cardiologist. Spoke with patient's cardiologist Dr. Nehemiah Massed who recommended admission for any further episodes of CP or if 2nd trop is significantly increased in 3 hours. Otherwise he is comfortable with patient going home, recommended starting patient on Imdur 30mg  QD, and he will see patient on Monday. Patient given 3 x 81mg  ASA (had taken a baby ASA prior to arrival). Will monitor closely and repeat troponin  Clinical Course as of Feb 02 1828  Fri Feb 02, 2019  1825 Patient remained CP free. Repeat troponin in 3 hrs with minimal change. Patient again prefers to go home and see his cardiologist on Monday. I explained to the patient risks of not being admitted including a larger heart attack, dysrhythmias, death. Patient understands these risks and continues to wish to go home. Will dc home with close f/u with Dr. Nehemiah Massed and strict return precautions.    [CV]    Clinical Course User Index [CV] Alfred Levins [place], MD     As part of my medical decision making, I reviewed the following data within the [place] notes reviewed and incorporated, Labs reviewed , EKG interpreted , Old EKG reviewed, Old chart reviewed, Radiograph reviewed , Discussed with admitting physician , A consult was requested and obtained from this/these consultant(s) Cardiology, Notes from prior ED visits and [place] Controlled Substance Database    Pertinent labs & imaging results that were available during my care of the patient were reviewed by me and considered in my medical decision making (see chart for  details).    ____________________________________________  FINAL CLINICAL IMPRESSION(S) / ED DIAGNOSES  Final diagnoses:  Unstable angina (HCC)      NEW MEDICATIONS STARTED DURING THIS VISIT:  ED Discharge Orders         Ordered    isosorbide mononitrate (IMDUR) 30 MG 24 hr tablet  Daily     02/02/19 1827           Note:  This document was prepared using Dragon voice recognition software and may include unintentional dictation errors.    Alfred Levins, [place], MD 02/02/19 [phone]

## 2019-02-02 NOTE — Discharge Instructions (Signed)
Return to the ER for any chest pain. Follow up with Dr. Nehemiah Massed on Monday. Start taking Imdur 30mg  daily today. Continue the aspirin.

## 2019-02-02 NOTE — ED Notes (Signed)
First Nurse Note: Pt c/o chest pain and left arm numbness

## 2019-02-02 NOTE — ED Notes (Signed)
MD Veronese at bedside  

## 2019-02-06 DIAGNOSIS — K8000 Calculus of gallbladder with acute cholecystitis without obstruction: Secondary | ICD-10-CM

## 2019-02-07 ENCOUNTER — Telehealth: Payer: Self-pay | Admitting: *Deleted

## 2019-02-07 NOTE — Telephone Encounter (Signed)
Contacted and schedule for lung screening scan. See prior note for eligibility information.

## 2019-02-15 ENCOUNTER — Inpatient Hospital Stay: Payer: BLUE CROSS/BLUE SHIELD | Admitting: Nurse Practitioner

## 2019-02-15 ENCOUNTER — Encounter: Payer: Self-pay | Admitting: Surgery

## 2019-02-15 ENCOUNTER — Telehealth (INDEPENDENT_AMBULATORY_CARE_PROVIDER_SITE_OTHER): Payer: BLUE CROSS/BLUE SHIELD | Admitting: Surgery

## 2019-02-15 ENCOUNTER — Ambulatory Visit: Admission: RE | Admit: 2019-02-15 | Payer: BLUE CROSS/BLUE SHIELD | Source: Ambulatory Visit

## 2019-02-15 ENCOUNTER — Other Ambulatory Visit: Payer: Self-pay

## 2019-02-15 DIAGNOSIS — Z4889 Encounter for other specified surgical aftercare: Secondary | ICD-10-CM

## 2019-02-15 DIAGNOSIS — K8000 Calculus of gallbladder with acute cholecystitis without obstruction: Secondary | ICD-10-CM

## 2019-02-15 NOTE — Progress Notes (Signed)
Referring provider:  Arnetha Courser, MD [address] [place], [place] [address]  Virtual Visit via Telemedicine Note  I connected with Kerrion Kemppainen by telephone at his home on 02/15/19 at  9:45 AM EDT and verified that I was speaking with the correct person using their name and date of birth.   I discussed the limitations, risks, security and privacy concerns of performing an evaluation and management service by telephonic telemedicine and the availability of in person appointments. I also discussed with the patient that there may be a patient responsible charge related to this service. The patient expressed understanding and agreed to proceed.  History of Present Illness: 58 y.o. Male is being evaluated for post-op follow-up 15 Days s/p laparoscopic cholecystectomy Rosana Hoes, 01/31/2019) for acute calculous cholecystitis. Patient reports complete resolution of pre-operative pain and has been tolerating regular diet with +flatus and normal BM's, denies N/V, fever/chills, CP, or SOB. He did, however, experience some epigastric pain radiating to his shoulder, for which he presented to [facility] ED in the context of his cardiac history. Preliminary workup was unremarkable, and patient was discharged home with outpatient follow-up with his cardiologist, Dr. Nehemiah Massed, who performed a stress test (5/18), and patient denies any further CP, shoulder pain, or SOB. Patient further describes incision(s) as well-approximated without any surrounding redness or any associated drainage, purulent or otherwise.  Review of Systems:  Constitutional: denies fever/chills  Respiratory: denies shortness of breath, wheezing  Cardiovascular: denies chest pain, palpitations  Gastrointestinal: abdominal pain, N/V, and bowel function as per interval history Skin: Denies any other rashes or skin discolorations except post-surgical wounds as per interval history  Assessment:  58 y.o. yo Male with a problem list  including...  Patient Active Problem List   Diagnosis Date Noted  . Aortic aneurysm ([place]) 12/10/2018  . Gallstone 12/10/2018  . Ingrown toenail of both feet 11/29/2018  . History of MI (myocardial infarction) 07/25/2018  . Senile purpura ([place]) 07/25/2018  . Overweight (BMI 25.0-29.9) 12/20/2017  . Gastritis without bleeding   . Diarrhea 10/18/2017  . Acquired trigger finger 02/15/2017  . Snoring 05/07/2016  . Medication monitoring encounter 08/19/2015  . COPD (chronic obstructive pulmonary disease) ([place])   . Emphysema of lung ([place])   . Diabetes type 2, uncontrolled ([place])   . GERD (gastroesophageal reflux disease)   . Hyperlipidemia   . Hypertension   . Tobacco use   . Pulmonary nodules   . CAD (coronary artery disease)   . Cirrhosis ([place])   . Alkaline phosphatase elevation   . Aortic atherosclerosis ([place])   . Multiple pulmonary nodules determined by computed tomography of lung 12/31/2014  . COPD mixed type ([place]) 12/31/2014    doing well at time of current post-surgical encounter/evaluation 15 Days s/p laparoscopic cholecystectomy Rosana Hoes, 01/31/2019) for acute calculous cholecystitis.  Follow-up Instructions / Plan:               - advance diet as tolerated  - patient's pathology results were discussed             - okay to submerge incisions under water (baths, swimming) prn             - gradually resume all activities without restrictions over next 2 weeks             - apply sunblock particularly to incisions with sun exposure to reduce pigmentation of scars             - patient  advised he may be contacted to schedule a subsequent appointment/exam when such routine follow-up appointments may safely be performed, though patient may certainly decline a subsequent appointment if wishes to do so             - otherwise, return to clinic as needed, instructed to call office if any questions or concerns  All of the above recommendations were discussed with the patient, and all of  patient's questions were answered to his expressed satisfaction.  The patient was advised to call back or seek an in-person evaluation if the symptoms worsen or if the condition fails to improve as anticipated.  From [facility] outpatient surgery office, I provided 15 minutes of non-face-to-face time during this encounter.  -- Marilynne Drivers Rosana Hoes, MD, [place] Partnering for exceptional care. Office: [phone]

## 2019-02-16 ENCOUNTER — Inpatient Hospital Stay: Payer: BLUE CROSS/BLUE SHIELD | Attending: Nurse Practitioner | Admitting: Nurse Practitioner

## 2019-02-16 ENCOUNTER — Ambulatory Visit: Payer: BLUE CROSS/BLUE SHIELD | Admitting: Nurse Practitioner

## 2019-02-16 ENCOUNTER — Ambulatory Visit
Admission: RE | Admit: 2019-02-16 | Discharge: 2019-02-16 | Disposition: A | Discharge status: Home / Self Care | Payer: BLUE CROSS/BLUE SHIELD | Source: Ambulatory Visit | Attending: Oncology | Admitting: Oncology

## 2019-02-16 ENCOUNTER — Other Ambulatory Visit: Payer: Self-pay

## 2019-02-16 DIAGNOSIS — Z87891 Personal history of nicotine dependence: Secondary | ICD-10-CM

## 2019-02-16 DIAGNOSIS — Z122 Encounter for screening for malignant neoplasm of respiratory organs: Secondary | ICD-10-CM | POA: Diagnosis not present

## 2019-02-16 NOTE — Progress Notes (Signed)
Virtual Visit via Telemedicine Note   I connected with Leona Wade Fuente on 02/16/19 EST at 11:00 AM EST by video enabled telemedicine visit and verified that I am speaking with the correct person using two identifiers.   I discussed the limitations, risks, security and privacy concerns of performing an evaluation and management service by telemedicine and the availability of in-person appointments. I also discussed with the patient that there may be a patient responsible charge related to this service. The patient expressed understanding and agreed to proceed.   Other persons participating in the visit and their role in the encounter: Shawn Perkins, RN  Patient's location: clinic  Provider's location: home  Chief Complaint: Low Dose CT Screening  Patient agreed to evaluation by telephone/telemedicine to discuss shared decision making for consideration of low dose CT lung cancer screening.    In accordance with CMS guidelines, patient has met eligibility criteria including age, absence of signs or symptoms of lung cancer.  Social History   Tobacco Use  . Smoking status: Current Every Day Smoker    Packs/day: 1.00    Years: 45.00    Pack years: 45.00    Types: Cigarettes  . Smokeless tobacco: Never Used  Substance Use Topics  . Alcohol use: No    Alcohol/week: 0.0 standard drinks     A shared decision-making session was conducted prior to the performance of CT scan. This includes one or more decision aids, includes benefits and harms of screening, follow-up diagnostic testing, over-diagnosis, false positive rate, and total radiation exposure.   Counseling on the importance of adherence to annual lung cancer LDCT screening, impact of co-morbidities, and ability or willingness to undergo diagnosis and treatment is imperative for compliance of the program.   Counseling on the importance of continued smoking cessation for former smokers; the importance of smoking cessation for  current smokers, and information about tobacco cessation interventions have been given to patient including [place] [facility] and [employer] quit [place] programs.   Written order for lung cancer screening with LDCT has been given to the patient and any and all questions have been answered to the best of my abilities.    Yearly follow up will be coordinated by Shawn Perkins, Thoracic Navigator.  I discussed the assessment and treatment plan with the patient. The patient was provided an opportunity to ask questions and all were answered. The patient agreed with the plan and demonstrated an understanding of the instructions.   The patient was advised to call back or seek an in-person evaluation if the symptoms worsen or if the condition fails to improve as anticipated.   I provided 15 minutes of face-to-face video visit time during this encounter, and > 50% was spent counseling as documented under my assessment & plan.   Lauren Allen, DNP, AGNP-C [facility] [phone] (work cell) [phone] (office)  

## 2019-02-20 ENCOUNTER — Telehealth: Payer: Self-pay | Admitting: *Deleted

## 2019-02-20 NOTE — Telephone Encounter (Signed)
Notified patient of LDCT lung cancer screening program results with recommendation for 6 month follow up imaging. Also notified of incidental findings noted below and is encouraged to discuss further with PCP who will receive a copy of this note and/or the CT report. Patient verbalizes understanding.   IMPRESSION: 1. Lung-RADS 3, probably benign findings. Short-term follow-up in 6 months is recommended with repeat low-dose chest CT without contrast (please use the following order, "CT CHEST LCS NODULE FOLLOW-UP W/O CM"). 2. Diffuse bronchial wall thickening with emphysema, as above; imaging findings suggestive of underlying COPD. 3. Coronary artery calcifications  Aortic Atherosclerosis (ICD10-I70.0) and Emphysema (ICD10-J43.9).

## 2019-02-26 ENCOUNTER — Ambulatory Visit: Payer: BLUE CROSS/BLUE SHIELD | Admitting: Gastroenterology

## 2019-02-27 ENCOUNTER — Ambulatory Visit: Payer: Self-pay | Admitting: Family Medicine

## 2019-02-28 ENCOUNTER — Other Ambulatory Visit: Payer: Self-pay

## 2019-02-28 ENCOUNTER — Encounter: Payer: Self-pay | Admitting: Gastroenterology

## 2019-02-28 ENCOUNTER — Ambulatory Visit (INDEPENDENT_AMBULATORY_CARE_PROVIDER_SITE_OTHER): Payer: BLUE CROSS/BLUE SHIELD | Admitting: Gastroenterology

## 2019-02-28 ENCOUNTER — Other Ambulatory Visit: Payer: Self-pay | Admitting: Gastroenterology

## 2019-02-28 ENCOUNTER — Ambulatory Visit: Payer: Self-pay | Admitting: Family Medicine

## 2019-02-28 VITALS — BP 129/71 | HR 76 | Temp 97.7°F

## 2019-02-28 DIAGNOSIS — R748 Abnormal levels of other serum enzymes: Secondary | ICD-10-CM

## 2019-02-28 DIAGNOSIS — Z1211 Encounter for screening for malignant neoplasm of colon: Secondary | ICD-10-CM | POA: Diagnosis not present

## 2019-02-28 NOTE — Progress Notes (Signed)
Primary Care Physician: Arnetha Courser, MD  Primary Gastroenterologist:  Dr. Lucilla Lame  Chief Complaint  Patient presents with   Follow up elevated liver enzymes    HPI: Scott Rosario is a 58 y.o. male here with persistently elevated alkaline phosphatase.  The patient had a liver biopsy done and the biopsy was reviewed for second opinion at [facility].  The results of the biopsy showed:  DIAGNOSIS:  A. LIVER, RIGHT LOBE; ULTRASOUND-GUIDED BIOPSY:  - MILD MONONUCLEAR PORTAL INFLAMMATION, FOCAL MILD PERIDUCTAL FIBROSIS  AND FOCAL LOW-GRADE DUCTULAR REACTION, SEE COMMENT.  - THIN-WALLED SMALL "SHUNT TYPE" VENULES IN AND AROUND FEW PORTAL  TRACTS.  - FOCAL MILD PORTAL FIBROSIS.  The review at [facility] had added: changes raise suspicion for early stages of primary sclerosing cholangitis  To these results the patient was started on ursodeoxycholic acid.  Despite this the patient's alkaline phosphatase remains elevated at his last blood draw in April of this year at 142.  Current Outpatient Medications  Medication Sig Dispense Refill   albuterol (PROVENTIL HFA;VENTOLIN HFA) 108 (90 Base) MCG/ACT inhaler Inhale 2 puffs into the lungs every 4 (four) hours as needed for wheezing or shortness of breath. 1 Inhaler 1   aspirin EC 81 MG tablet Take 81 mg by mouth daily.     atorvastatin (LIPITOR) 80 MG tablet Take 1 tablet (80 mg total) by mouth at bedtime. 30 tablet 2   glipiZIDE (GLUCOTROL XL) 10 MG 24 hr tablet TAKE 1 TABLET BY MOUTH ONCE DAILY. 90 tablet 1   Insulin Glargine (LANTUS SOLOSTAR) 100 UNIT/ML Solostar Pen Inject 12 Units into the skin at bedtime. 15 mL 2   isosorbide mononitrate (IMDUR) 30 MG 24 hr tablet Take 1 tablet (30 mg total) by mouth daily. 30 tablet 1   losartan (COZAAR) 50 MG tablet Take 1 tablet (50 mg total) by mouth daily. (Patient taking differently: Take 50 mg by mouth every evening. ) 30 tablet 2   metFORMIN (GLUCOPHAGE) 500 MG tablet TAKE 1 TABLET BY  MOUTH TWICE DAILY WITH MEALS (Patient taking differently: Take 1,000 mg by mouth 2 (two) times daily with a meal. ) 60 tablet 2   metoprolol succinate (TOPROL-XL) 25 MG 24 hr tablet Take 1 tablet (25 mg total) by mouth daily. (Patient taking differently: Take 25 mg by mouth every morning. ) 30 tablet 2   nitroGLYCERIN (NITROSTAT) 0.4 MG SL tablet Place 1 tablet (0.4 mg total) under the tongue every 5 (five) minutes as needed for chest pain. Max of 3 total; call 911 25 tablet 5   NOVOLOG FLEXPEN 100 UNIT/ML FlexPen      ondansetron (ZOFRAN-ODT) 4 MG disintegrating tablet      oxyCODONE-acetaminophen (PERCOCET/ROXICET) 5-325 MG tablet Take 1 tablet by mouth every 4 (four) hours as needed for severe pain. 30 tablet 0   pantoprazole (PROTONIX) 40 MG tablet Take 1 tablet (40 mg total) by mouth 2 (two) times daily. 60 tablet 2   ursodiol (ACTIGALL) 300 MG capsule Take 2 capsules (600 mg total) by mouth 3 (three) times daily. (Patient taking differently: Take 600 mg by mouth 2 (two) times daily. ) 180 capsule 3   No current facility-administered medications for this visit.     Allergies as of 02/28/2019 - Review Complete 02/28/2019  Allergen Reaction Noted   Canagliflozin Other (See Comments) 01/25/2017   Semaglutide Diarrhea and Nausea And Vomiting 10/19/2017   Tape  01/25/2019    ROS:  General: Negative for anorexia, weight  loss, fever, chills, fatigue, weakness. ENT: Negative for hoarseness, difficulty swallowing , nasal congestion. CV: Negative for chest pain, angina, palpitations, dyspnea on exertion, peripheral edema.  Respiratory: Negative for dyspnea at rest, dyspnea on exertion, cough, sputum, wheezing.  GI: See history of present illness. GU:  Negative for dysuria, hematuria, urinary incontinence, urinary frequency, nocturnal urination.  Endo: Negative for unusual weight change.    Physical Examination:   BP 129/71    Pulse 76    Temp 97.7 F (36.5 C) (Oral)   General:  Well-nourished, well-developed in no acute distress.  Eyes: No icterus. Conjunctivae pink. Extremities: No lower extremity edema. No clubbing or deformities. Neuro: Alert and oriented x 3.  Grossly intact. Skin: Warm and dry, no jaundice.   Psych: Alert and cooperative, normal mood and affect.  Labs:    Imaging Studies: Dg Chest 2 View  Result Date: 02/02/2019 CLINICAL DATA:  Chest pain. EXAM: CHEST - 2 VIEW COMPARISON:  Chest x-ray dated 09/22/2017 and CT scans of the chest dated 02/24/2017, 03/05/2016 and 02/01/2015 FINDINGS: The heart size and pulmonary vascularity are normal. Stable small nodule at the left lung base. No infiltrates or effusions. The lungs are hyperinflated with flattening of the diaphragm consistent with emphysema demonstrated on the prior chest CTs. No acute bone abnormality. IMPRESSION: No acute abnormality.  Stable small nodule at the left lung base. Emphysema. Electronically Signed   By: Lorriane Shire M.D.   On: 02/02/2019 15:33   Ct Chest Lung Cancer Screening Low Dose Wo Contrast  Result Date: 02/16/2019 CLINICAL DATA:  Lung cancer screening. Current asymptomatic smoker. 67.5 pack-year history. EXAM: CT CHEST WITHOUT CONTRAST LOW-DOSE FOR LUNG CANCER SCREENING TECHNIQUE: Multidetector CT imaging of the chest was performed following the standard protocol without IV contrast. COMPARISON:  02/24/2017 FINDINGS: Cardiovascular: The heart size appears normal. No pericardial effusion identified. Aortic atherosclerosis noted. Calcifications within the RCA, LAD and left circumflex coronary arteries noted. Mediastinum/Nodes: Normal appearance of the thyroid gland. The trachea appears patent and is midline. Normal appearance of the esophagus. No mediastinal or hilar adenopathy identified. Lungs/Pleura: Advanced changes of emphysema. Multiple noncalcified pulmonary nodules are scattered throughout both lungs. The largest nodule is in the posterolateral left lower lobe with an  equivalent diameter of 6.2 mm. Upper Abdomen: No acute abnormality.  Previous cholecystectomy. Musculoskeletal: No chest wall mass or suspicious bone lesions identified. IMPRESSION: 1. Lung-RADS 3, probably benign findings. Short-term follow-up in 6 months is recommended with repeat low-dose chest CT without contrast (please use the following order, "CT CHEST LCS NODULE FOLLOW-UP W/O CM"). 2. Diffuse bronchial wall thickening with emphysema, as above; imaging findings suggestive of underlying COPD. 3. Coronary artery calcifications Aortic Atherosclerosis (ICD10-I70.0) and Emphysema (ICD10-J43.9). Electronically Signed   By: Kerby Moors M.D.   On: 02/16/2019 16:56    Assessment and Plan:   Scott Rosario is a 58 y.o. y/o male who has had a persistent elevation in his alkaline phosphatase.  The patient had liver biopsy that could not rule out PSC.  He was started on ursodeoxycholic acid and his alkaline phosphatase is now back to normal.  The patient had his gallbladder taken out a few weeks ago.  He will have his liver enzymes checked again in addition to being set up for colonoscopy to rule out inflammatory bowel disease which would be seen in [facility].  It is alkaline phosphatase is still elevated and he had a negative colonoscopy and then he may need to go to a tertiary care  center for further evaluation and treatment for his elevated alkaline phosphatase.  The patient has been explained the plan and agrees with it.    Lucilla Lame, MD. [facility]   Note: This dictation was prepared with Dragon dictation along with smaller phrase technology. Any transcriptional errors that result from this process are unintentional.

## 2019-02-28 NOTE — H&P (View-Only) (Signed)
Primary Care Physician: Arnetha Courser, MD  Primary Gastroenterologist:  Dr. Lucilla Lame  Chief Complaint  Patient presents with   Follow up elevated liver enzymes    HPI: Scott Rosario is a 58 y.o. male here with persistently elevated alkaline phosphatase.  The patient had a liver biopsy done and the biopsy was reviewed for second opinion at [facility].  The results of the biopsy showed:  DIAGNOSIS:  A. LIVER, RIGHT LOBE; ULTRASOUND-GUIDED BIOPSY:  - MILD MONONUCLEAR PORTAL INFLAMMATION, FOCAL MILD PERIDUCTAL FIBROSIS  AND FOCAL LOW-GRADE DUCTULAR REACTION, SEE COMMENT.  - THIN-WALLED SMALL "SHUNT TYPE" VENULES IN AND AROUND FEW PORTAL  TRACTS.  - FOCAL MILD PORTAL FIBROSIS.  The review at [facility] had added: changes raise suspicion for early stages of primary sclerosing cholangitis  To these results the patient was started on ursodeoxycholic acid.  Despite this the patient's alkaline phosphatase remains elevated at his last blood draw in April of this year at 142.  Current Outpatient Medications  Medication Sig Dispense Refill   albuterol (PROVENTIL HFA;VENTOLIN HFA) 108 (90 Base) MCG/ACT inhaler Inhale 2 puffs into the lungs every 4 (four) hours as needed for wheezing or shortness of breath. 1 Inhaler 1   aspirin EC 81 MG tablet Take 81 mg by mouth daily.     atorvastatin (LIPITOR) 80 MG tablet Take 1 tablet (80 mg total) by mouth at bedtime. 30 tablet 2   glipiZIDE (GLUCOTROL XL) 10 MG 24 hr tablet TAKE 1 TABLET BY MOUTH ONCE DAILY. 90 tablet 1   Insulin Glargine (LANTUS SOLOSTAR) 100 UNIT/ML Solostar Pen Inject 12 Units into the skin at bedtime. 15 mL 2   isosorbide mononitrate (IMDUR) 30 MG 24 hr tablet Take 1 tablet (30 mg total) by mouth daily. 30 tablet 1   losartan (COZAAR) 50 MG tablet Take 1 tablet (50 mg total) by mouth daily. (Patient taking differently: Take 50 mg by mouth every evening. ) 30 tablet 2   metFORMIN (GLUCOPHAGE) 500 MG tablet TAKE 1 TABLET BY  MOUTH TWICE DAILY WITH MEALS (Patient taking differently: Take 1,000 mg by mouth 2 (two) times daily with a meal. ) 60 tablet 2   metoprolol succinate (TOPROL-XL) 25 MG 24 hr tablet Take 1 tablet (25 mg total) by mouth daily. (Patient taking differently: Take 25 mg by mouth every morning. ) 30 tablet 2   nitroGLYCERIN (NITROSTAT) 0.4 MG SL tablet Place 1 tablet (0.4 mg total) under the tongue every 5 (five) minutes as needed for chest pain. Max of 3 total; call 911 25 tablet 5   NOVOLOG FLEXPEN 100 UNIT/ML FlexPen      ondansetron (ZOFRAN-ODT) 4 MG disintegrating tablet      oxyCODONE-acetaminophen (PERCOCET/ROXICET) 5-325 MG tablet Take 1 tablet by mouth every 4 (four) hours as needed for severe pain. 30 tablet 0   pantoprazole (PROTONIX) 40 MG tablet Take 1 tablet (40 mg total) by mouth 2 (two) times daily. 60 tablet 2   ursodiol (ACTIGALL) 300 MG capsule Take 2 capsules (600 mg total) by mouth 3 (three) times daily. (Patient taking differently: Take 600 mg by mouth 2 (two) times daily. ) 180 capsule 3   No current facility-administered medications for this visit.     Allergies as of 02/28/2019 - Review Complete 02/28/2019  Allergen Reaction Noted   Canagliflozin Other (See Comments) 01/25/2017   Semaglutide Diarrhea and Nausea And Vomiting 10/19/2017   Tape  01/25/2019    ROS:  General: Negative for anorexia, weight  loss, fever, chills, fatigue, weakness. ENT: Negative for hoarseness, difficulty swallowing , nasal congestion. CV: Negative for chest pain, angina, palpitations, dyspnea on exertion, peripheral edema.  Respiratory: Negative for dyspnea at rest, dyspnea on exertion, cough, sputum, wheezing.  GI: See history of present illness. GU:  Negative for dysuria, hematuria, urinary incontinence, urinary frequency, nocturnal urination.  Endo: Negative for unusual weight change.    Physical Examination:   BP 129/71    Pulse 76    Temp 97.7 F (36.5 C) (Oral)   General:  Well-nourished, well-developed in no acute distress.  Eyes: No icterus. Conjunctivae pink. Extremities: No lower extremity edema. No clubbing or deformities. Neuro: Alert and oriented x 3.  Grossly intact. Skin: Warm and dry, no jaundice.   Psych: Alert and cooperative, normal mood and affect.  Labs:    Imaging Studies: Dg Chest 2 View  Result Date: 02/02/2019 CLINICAL DATA:  Chest pain. EXAM: CHEST - 2 VIEW COMPARISON:  Chest x-ray dated 09/22/2017 and CT scans of the chest dated 02/24/2017, 03/05/2016 and 02/01/2015 FINDINGS: The heart size and pulmonary vascularity are normal. Stable small nodule at the left lung base. No infiltrates or effusions. The lungs are hyperinflated with flattening of the diaphragm consistent with emphysema demonstrated on the prior chest CTs. No acute bone abnormality. IMPRESSION: No acute abnormality.  Stable small nodule at the left lung base. Emphysema. Electronically Signed   By: Lorriane Shire M.D.   On: 02/02/2019 15:33   Ct Chest Lung Cancer Screening Low Dose Wo Contrast  Result Date: 02/16/2019 CLINICAL DATA:  Lung cancer screening. Current asymptomatic smoker. 67.5 pack-year history. EXAM: CT CHEST WITHOUT CONTRAST LOW-DOSE FOR LUNG CANCER SCREENING TECHNIQUE: Multidetector CT imaging of the chest was performed following the standard protocol without IV contrast. COMPARISON:  02/24/2017 FINDINGS: Cardiovascular: The heart size appears normal. No pericardial effusion identified. Aortic atherosclerosis noted. Calcifications within the RCA, LAD and left circumflex coronary arteries noted. Mediastinum/Nodes: Normal appearance of the thyroid gland. The trachea appears patent and is midline. Normal appearance of the esophagus. No mediastinal or hilar adenopathy identified. Lungs/Pleura: Advanced changes of emphysema. Multiple noncalcified pulmonary nodules are scattered throughout both lungs. The largest nodule is in the posterolateral left lower lobe with an  equivalent diameter of 6.2 mm. Upper Abdomen: No acute abnormality.  Previous cholecystectomy. Musculoskeletal: No chest wall mass or suspicious bone lesions identified. IMPRESSION: 1. Lung-RADS 3, probably benign findings. Short-term follow-up in 6 months is recommended with repeat low-dose chest CT without contrast (please use the following order, "CT CHEST LCS NODULE FOLLOW-UP W/O CM"). 2. Diffuse bronchial wall thickening with emphysema, as above; imaging findings suggestive of underlying COPD. 3. Coronary artery calcifications Aortic Atherosclerosis (ICD10-I70.0) and Emphysema (ICD10-J43.9). Electronically Signed   By: Kerby Moors M.D.   On: 02/16/2019 16:56    Assessment and Plan:   Primus Gritton is a 57 y.o. y/o male who has had a persistent elevation in his alkaline phosphatase.  The patient had liver biopsy that could not rule out PSC.  He was started on ursodeoxycholic acid and his alkaline phosphatase is now back to normal.  The patient had his gallbladder taken out a few weeks ago.  He will have his liver enzymes checked again in addition to being set up for colonoscopy to rule out inflammatory bowel disease which would be seen in [facility].  It is alkaline phosphatase is still elevated and he had a negative colonoscopy and then he may need to go to a tertiary care  center for further evaluation and treatment for his elevated alkaline phosphatase.  The patient has been explained the plan and agrees with it.    Lucilla Lame, MD. [facility]   Note: This dictation was prepared with Dragon dictation along with smaller phrase technology. Any transcriptional errors that result from this process are unintentional.

## 2019-03-01 ENCOUNTER — Telehealth: Payer: Self-pay | Admitting: Surgery

## 2019-03-01 LAB — HEPATIC FUNCTION PANEL
ALT: 19 IU/L (ref 0–44)
AST: 17 IU/L (ref 0–40)
Albumin: 4.3 g/dL (ref 3.8–4.9)
Alkaline Phosphatase: 148 IU/L — ABNORMAL HIGH (ref 39–117)
Bilirubin Total: 0.9 mg/dL (ref 0.0–1.2)
Bilirubin, Direct: 0.26 mg/dL (ref 0.00–0.40)
Total Protein: 6.2 g/dL (ref 6.0–8.5)

## 2019-03-01 NOTE — Telephone Encounter (Signed)
Left message for patient to return call. Spoke with Dr.Davis. Diarrhea could last up to several months. Please avoid fatty foods and see if this helps.

## 2019-03-01 NOTE — Telephone Encounter (Signed)
Patient is still having diarrhea  with everything he eats, since he's had surgery. Please call patient and advise.

## 2019-03-01 NOTE — Telephone Encounter (Signed)
Spoke with Dr.Davis.  Reminded that eating fatty foods can cause watery stools and to avoid eating fatty foods.  Keep a diary on his daily intake of foods. If its seems that the fatty foods are causing the watery stools to back off and then gradually add in moderation to his diet. It can take up to several months for the diarrhea to subside.

## 2019-03-05 ENCOUNTER — Ambulatory Visit: Payer: BLUE CROSS/BLUE SHIELD | Admitting: Gastroenterology

## 2019-03-05 ENCOUNTER — Ambulatory Visit (INDEPENDENT_AMBULATORY_CARE_PROVIDER_SITE_OTHER): Payer: BLUE CROSS/BLUE SHIELD | Admitting: Nurse Practitioner

## 2019-03-05 ENCOUNTER — Other Ambulatory Visit: Payer: Self-pay

## 2019-03-05 ENCOUNTER — Encounter: Payer: Self-pay | Admitting: Nurse Practitioner

## 2019-03-05 VITALS — BP 118/70 | HR 99 | Temp 98.3°F | Resp 14 | Ht 68.0 in | Wt 177.2 lb

## 2019-03-05 DIAGNOSIS — I714 Abdominal aortic aneurysm, without rupture, unspecified: Secondary | ICD-10-CM

## 2019-03-05 DIAGNOSIS — I2583 Coronary atherosclerosis due to lipid rich plaque: Secondary | ICD-10-CM

## 2019-03-05 DIAGNOSIS — J439 Emphysema, unspecified: Secondary | ICD-10-CM

## 2019-03-05 DIAGNOSIS — I2510 Atherosclerotic heart disease of native coronary artery without angina pectoris: Secondary | ICD-10-CM

## 2019-03-05 DIAGNOSIS — I700 Atherosclerosis of aorta: Secondary | ICD-10-CM | POA: Diagnosis not present

## 2019-03-05 DIAGNOSIS — I10 Essential (primary) hypertension: Secondary | ICD-10-CM

## 2019-03-05 DIAGNOSIS — K219 Gastro-esophageal reflux disease without esophagitis: Secondary | ICD-10-CM

## 2019-03-05 DIAGNOSIS — E782 Mixed hyperlipidemia: Secondary | ICD-10-CM

## 2019-03-05 DIAGNOSIS — K7469 Other cirrhosis of liver: Secondary | ICD-10-CM

## 2019-03-05 DIAGNOSIS — E1165 Type 2 diabetes mellitus with hyperglycemia: Secondary | ICD-10-CM

## 2019-03-05 MED ORDER — ATORVASTATIN CALCIUM 80 MG PO TABS: 80.0000 mg | ORAL_TABLET | Freq: Every day | ORAL | 1 refills | Status: DC

## 2019-03-05 MED ORDER — PANTOPRAZOLE SODIUM 40 MG PO TBEC: 40.0000 mg | DELAYED_RELEASE_TABLET | Freq: Two times a day (BID) | ORAL | 1 refills | Status: DC

## 2019-03-05 MED ORDER — METOPROLOL SUCCINATE ER 25 MG PO TB24: 25.0000 mg | ORAL_TABLET | ORAL | 1 refills | Status: DC

## 2019-03-05 MED ORDER — LOSARTAN POTASSIUM 50 MG PO TABS: 50.0000 mg | ORAL_TABLET | Freq: Every evening | ORAL | 1 refills | Status: DC

## 2019-03-05 NOTE — Progress Notes (Signed)
Name: Scott Rosario   MRN: [id number]    DOB: [birth year]   Date:03/05/2019       Progress Note  Subjective  Chief Complaint  Chief Complaint  Patient presents with  . Follow-up    HPI  Hypertension Patient is on metorprolol 25mg  daily, losartan 50mg  daily.  Takes medications as prescribed with no missed doses a month.  moderately compliant with low-salt diet.  Denies chest pain, headaches, blurry vision. Patient has coronary artery disease- follows up with cardiologist Dr. Nehemiah Massed Patient has AAA - follows up with Dr. Rosana Hoes- vascular  Hyperlipidemia Patient rx atorvastatin 80 mg Takes medications as prescribed with no missed doses a month.  Diet: at least two servings of vegetables a day. Tries to stay away from fried foods, eats mostly grilled chicken or fish now.  Denies myalgias Patient aortic atherosclerosis. Lab Results  Component Value Date   CHOL 100 11/29/2018   HDL 28 (L) 11/29/2018   LDLCALC 54 11/29/2018   TRIG 94 11/29/2018   CHOLHDL 3.6 11/29/2018    COPD Patient uses albuterol PRN  Smoking 1 ppd.  Endorses morning daily cough.  Denies shortness of breath, wheezing.   Cirrhosis Patient follows up with Dr. Allen Norris- GI. Currently taking actigall.  Alcohol- does not drink alcohol.   Diabetes Mellitus He is taking metformin 1000 mg twice daily, Glipizide XL 10 mg daily, Lantus 14 units each evening and doing sliding scale insulin for novolog. States his sugars have usually been below 100 so he has not needed the sliding scale. He has had a few episodes where his blood sugars have gotten in the 60's and he felt jittery.  Denies polyphagia, polydipsia, polyuria.  Follows up with endocrinology- Warnell Forester NP Lab Results  Component Value Date   HGBA1C 8.6 (H) 11/29/2018        PHQ2/9: Depression screen [facility] 2/9 03/05/2019 12/21/2018 11/29/2018 07/25/2018 07/13/2018  Decreased Interest 0 0 0 0 0  Down, Depressed, Hopeless 0 0 0 0 0  PHQ - 2 Score 0  0 0 0 0  Altered sleeping 0 0 0 - 0  Tired, decreased energy 0 0 0 - 0  Change in appetite 0 0 0 - 0  Feeling bad or failure about yourself  0 0 0 - 0  Trouble concentrating 0 0 0 - 0  Moving slowly or fidgety/restless 0 0 0 - 0  Suicidal thoughts 0 0 0 - 0  PHQ-9 Score 0 0 0 - 0  Difficult doing work/chores Not difficult at all Not difficult at all Not difficult at all - Not difficult at all     PHQ reviewed. Negative  Patient Active Problem List   Diagnosis Date Noted  . Tobacco dependency 12/18/2018  . Type 2 diabetes mellitus with hyperglycemia, without long-term current use of insulin ([place]) 12/18/2018  . Aortic aneurysm ([place]) 12/10/2018  . Gallstone 12/10/2018  . Ingrown toenail of both feet 11/29/2018  . History of MI (myocardial infarction) 07/25/2018  . Senile purpura ([place]) 07/25/2018  . Overweight (BMI 25.0-29.9) 12/20/2017  . Gastritis without bleeding   . Diarrhea 10/18/2017  . Acquired trigger finger 02/15/2017  . Snoring 05/07/2016  . Medication monitoring encounter 08/19/2015  . COPD (chronic obstructive pulmonary disease) ([place])   . Emphysema of lung ([place])   . Diabetes type 2, uncontrolled ([place])   . GERD (gastroesophageal reflux disease)   . Hyperlipidemia   . Hypertension   . Tobacco use   . Pulmonary nodules   .  CAD (coronary artery disease)   . Cirrhosis ([place])   . Alkaline phosphatase elevation   . Aortic atherosclerosis ([place])   . Multiple pulmonary nodules determined by computed tomography of lung 12/31/2014  . COPD mixed type ([place]) 12/31/2014    Past Medical History:  Diagnosis Date  . Acute calculous cholecystitis   . Alkaline phosphatase elevation   . Aortic aneurysm ([place]) 12/10/2018   3.4 cm March 2020  . Aortic atherosclerosis ([place])    on CT 02/25/14  . Asthma   . CAD (coronary artery disease)   . Cirrhosis ([place])   . COPD (chronic obstructive pulmonary disease) ([place])   . Diabetes mellitus without complication ([place])   . Emphysema of lung  ([place])   . GERD (gastroesophageal reflux disease)   . Heart attack ([place])    2005, 2017 (stent after each)  . History of hiatal hernia    small  . History of kidney stones    h/o  . Hyperlipidemia   . Hypertension   . Pneumonia 2018  . Pulmonary nodules    x 3, (42mm, 21mm, 56mm) chest CT February 25, 2014  . Renal cyst    on CT 02/25/14  . Tobacco use     Past Surgical History:  Procedure Laterality Date  . APPENDECTOMY    . CARDIAC CATHETERIZATION Left 03/09/2016   Procedure: Left Heart Cath and Coronary Angiography;  Surgeon: Corey Skains, MD;  Location: [place] [facility];  Service: Cardiovascular;  Laterality: Left;  . CARDIAC CATHETERIZATION N/A 03/09/2016   Procedure: Coronary Stent Intervention;  Surgeon: Isaias Cowman, MD;  Location: [place] [facility];  Service: Cardiovascular;  Laterality: N/A;  . CHOLECYSTECTOMY N/A 01/31/2019   Procedure: LAPAROSCOPIC CHOLECYSTECTOMY - DIABETIC;  Surgeon: Vickie Epley, MD;  Location: [facility];  Service: General;  Laterality: N/A;  . CORONARY ANGIOPLASTY WITH STENT PLACEMENT  10/25/2003   x 2  . ESOPHAGOGASTRODUODENOSCOPY (EGD) WITH PROPOFOL N/A 10/24/2017   Procedure: ESOPHAGOGASTRODUODENOSCOPY (EGD) WITH PROPOFOL;  Surgeon: Lucilla Lame, MD;  Location: [place];  Service: Endoscopy;  Laterality: N/A;  Diabetic - oral meds    Social History   Tobacco Use  . Smoking status: Current Every Day Smoker    Packs/day: 1.00    Years: 45.00    Pack years: 45.00    Types: Cigarettes  . Smokeless tobacco: Never Used  Substance Use Topics  . Alcohol use: No    Alcohol/week: 0.0 standard drinks     Current Outpatient Medications:  .  albuterol (PROVENTIL HFA;VENTOLIN HFA) 108 (90 Base) MCG/ACT inhaler, Inhale 2 puffs into the lungs every 4 (four) hours as needed for wheezing or shortness of breath., Disp: 1 Inhaler, Rfl: 1 .  aspirin EC 81 MG tablet, Take 81 mg by mouth daily., Disp: , Rfl:  .  atorvastatin (LIPITOR) 80  MG tablet, Take 1 tablet (80 mg total) by mouth at bedtime., Disp: 30 tablet, Rfl: 2 .  glipiZIDE (GLUCOTROL XL) 10 MG 24 hr tablet, TAKE 1 TABLET BY MOUTH ONCE DAILY., Disp: 90 tablet, Rfl: 1 .  Insulin Glargine (LANTUS SOLOSTAR) 100 UNIT/ML Solostar Pen, Inject 12 Units into the skin at bedtime., Disp: 15 mL, Rfl: 2 .  isosorbide mononitrate (IMDUR) 30 MG 24 hr tablet, Take 1 tablet (30 mg total) by mouth daily., Disp: 30 tablet, Rfl: 1 .  losartan (COZAAR) 50 MG tablet, Take 1 tablet (50 mg total) by mouth daily. (Patient taking differently: Take 50 mg by mouth every  evening. ), Disp: 30 tablet, Rfl: 2 .  metFORMIN (GLUCOPHAGE) 500 MG tablet, TAKE 1 TABLET BY MOUTH TWICE DAILY WITH MEALS (Patient taking differently: Take 1,000 mg by mouth 2 (two) times daily with a meal. ), Disp: 60 tablet, Rfl: 2 .  metoprolol succinate (TOPROL-XL) 25 MG 24 hr tablet, Take 1 tablet (25 mg total) by mouth daily. (Patient taking differently: Take 25 mg by mouth every morning. ), Disp: 30 tablet, Rfl: 2 .  nitroGLYCERIN (NITROSTAT) 0.4 MG SL tablet, Place 1 tablet (0.4 mg total) under the tongue every 5 (five) minutes as needed for chest pain. Max of 3 total; call 911, Disp: 25 tablet, Rfl: 5 .  NOVOLOG FLEXPEN 100 UNIT/ML FlexPen, as needed. , Disp: , Rfl:  .  ondansetron (ZOFRAN-ODT) 4 MG disintegrating tablet, Take 4 mg by mouth as needed. , Disp: , Rfl:  .  pantoprazole (PROTONIX) 40 MG tablet, Take 1 tablet (40 mg total) by mouth 2 (two) times daily., Disp: 60 tablet, Rfl: 2 .  ursodiol (ACTIGALL) 300 MG capsule, Take 2 capsules (600 mg total) by mouth 3 (three) times daily. (Patient taking differently: Take 600 mg by mouth 2 (two) times daily. ), Disp: 180 capsule, Rfl: 3 .  oxyCODONE-acetaminophen (PERCOCET/ROXICET) 5-325 MG tablet, Take 1 tablet by mouth every 4 (four) hours as needed for severe pain. (Patient not taking: Reported on 03/05/2019), Disp: 30 tablet, Rfl: 0  Allergies  Allergen Reactions  .  Canagliflozin Other (See Comments)    adverse reactions: Fatigue and Zombie feeling   . Semaglutide Diarrhea and Nausea And Vomiting    Ozempic  Acid Reflux  . Tape     SURGICAL TAPE-RIPPED SKIN OFF    Review of Systems  Constitutional: Negative for chills, fever and malaise/fatigue.  HENT: Negative for congestion, sinus pain and sore throat.   Eyes: Negative for blurred vision.  Respiratory: Positive for cough (chronic morning cough- smoking related). Negative for shortness of breath.   Cardiovascular: Negative for chest pain (resolved, checked out by cardiologist. ), palpitations and leg swelling.  Gastrointestinal: Positive for diarrhea (since gall bladder surgery, improving.). Negative for abdominal pain, constipation and nausea.  Genitourinary: Negative for dysuria.  Musculoskeletal: Negative for falls and joint pain.  Skin: Negative for rash.  Neurological: Negative for dizziness and headaches.  Endo/Heme/Allergies: Negative for polydipsia.  Psychiatric/Behavioral: The patient is not nervous/anxious and does not have insomnia.      No other specific complaints in a complete review of systems (except as listed in HPI above).  Objective  Vitals:   03/05/19 0843  BP: 118/70  Pulse: 99  Resp: 14  Temp: 98.3 F (36.8 C)  TempSrc: Oral  SpO2: 97%  Weight: 177 lb 3.2 oz (80.4 kg)  Height: 5\' 8"  (1.727 m)     Body mass index is 26.94 kg/m.  Nursing Note and Vital Signs reviewed.  Physical Exam Vitals signs reviewed.  Constitutional:      Appearance: He is well-developed.  HENT:     Head: Normocephalic and atraumatic.  Neck:     Musculoskeletal: Normal range of motion and neck supple.     Vascular: No carotid bruit.  Cardiovascular:     Heart sounds: Normal heart sounds.  Pulmonary:     Effort: Pulmonary effort is normal.     Breath sounds: Normal breath sounds.  Abdominal:     General: Bowel sounds are normal.     Palpations: Abdomen is soft.      Tenderness: There  is no abdominal tenderness.  Musculoskeletal: Normal range of motion.  Skin:    General: Skin is warm and dry.     Capillary Refill: Capillary refill takes less than 2 seconds.  Neurological:     Mental Status: He is alert and oriented to person, place, and time.     GCS: GCS eye subscore is 4. GCS verbal subscore is 5. GCS motor subscore is 6.     Sensory: No sensory deficit.  Psychiatric:        Speech: Speech normal.        Behavior: Behavior normal.        Thought Content: Thought content normal.        Judgment: Judgment normal.        No results found for this or any previous visit (from the past 48 hour(s)).  Assessment & Plan  1. Essential hypertension stable - losartan (COZAAR) 50 MG tablet; Take 1 tablet (50 mg total) by mouth every evening.  Dispense: 90 tablet; Refill: 1 - metoprolol succinate (TOPROL-XL) 25 MG 24 hr tablet; Take 1 tablet (25 mg total) by mouth every morning.  Dispense: 90 tablet; Refill: 1  2. Coronary artery disease due to lipid rich plaque Stable follow up with cards  3. Aortic atherosclerosis (HCC) - atorvastatin (LIPITOR) 80 MG tablet; Take 1 tablet (80 mg total) by mouth at bedtime.  Dispense: 90 tablet; Refill: 1  4. Abdominal aortic aneurysm (AAA) without rupture (HCC) BP and lipid control, follow up US in 3 years - atorvastatin (LIPITOR) 80 MG tablet; Take 1 tablet (80 mg total) by mouth at bedtime.  Dispense: 90 tablet; Refill: 1 - losartan (COZAAR) 50 MG tablet; Take 1 tablet (50 mg total) by mouth every evening.  Dispense: 90 tablet; Refill: 1 - metoprolol succinate (TOPROL-XL) 25 MG 24 hr tablet; Take 1 tablet (25 mg total) by mouth every morning.  Dispense: 90 tablet; Refill: 1  5. Pulmonary emphysema, unspecified emphysema type ([place]) stable  6. Other cirrhosis of liver ([place]) Follow up with GI   7. Uncontrolled type 2 diabetes mellitus with hyperglycemia ([place]) Follow up with endocrinology, and discuss low  blood sugars.   8. Mixed hyperlipidemia - atorvastatin (LIPITOR) 80 MG tablet; Take 1 tablet (80 mg total) by mouth at bedtime.  Dispense: 90 tablet; Refill: 1  9. Gastroesophageal reflux disease without esophagitis - pantoprazole (PROTONIX) 40 MG tablet; Take 1 tablet (40 mg total) by mouth 2 (two) times daily.  Dispense: 180 tablet; Refill: 1   -Red flags and when to present for emergency care or RTC including fever >101.37F, chest pain, shortness of breath, new/worsening/un-resolving symptoms,  reviewed with patient at time of visit. Follow up and care instructions discussed and provided in AVS.

## 2019-03-05 NOTE — Patient Instructions (Signed)
Good cholesterol, also called high-density lipoprotein (HDL) removes extra cholesterol and plaque buildup in your arteries and then sends it to your liver to get rid of and helps reduce your risk of heart disease, heart attack, and stroke.Foods that increase HDL: beans and legumes, whole grains, high-fiber fruits:prunes, apples, and pears; fatty fish- salmon, tuna, sardines; nuts, olive oil

## 2019-03-15 ENCOUNTER — Other Ambulatory Visit: Payer: Self-pay

## 2019-03-15 ENCOUNTER — Encounter: Payer: Self-pay | Admitting: *Deleted

## 2019-03-16 NOTE — Anesthesia Preprocedure Evaluation (Addendum)
Anesthesia Evaluation  Patient identified by MRN, date of birth, ID band Patient awake    Reviewed: NPO status   History of Anesthesia Complications Negative for: history of anesthetic complications  Airway Mallampati: II  TM Distance: >3 FB Neck ROM: full    Dental no notable dental hx.    Pulmonary COPD (mild),  COPD inhaler, Current Smoker,  Pulmonary nodules in lung: stable.   Pulmonary exam normal        Cardiovascular Exercise Tolerance: Good hypertension, + CAD, + Past MI (2017) and + Cardiac Stents (x3)  Normal cardiovascular exam  AAA, 3.4cm in 11/2018   Neuro/Psych negative neurological ROS  negative psych ROS   GI/Hepatic GERD  Controlled,(+) Cirrhosis  (??)      ,   Endo/Other  diabetes  Renal/GU   negative genitourinary   Musculoskeletal   Abdominal   Peds  Hematology negative hematology ROS (+)   Anesthesia Other Findings cards stable: 02/20/2019: Dr. Nehemiah Massed " He has had though further evaluation of cardiovascular disease with a stress echocardiogram. He performed well on his stress echo with excellent exercise tolerance and no evidence of chest discomfort at peak stress. There was minimal amount of chest pressure at lower levels but completely resolved at peak stress and no concerns post exercise. His stress test images show some mild to moderate anterior apical myocardial infarction and hypokinesis at peak stress there is slight worsening of hypokinesis possibly consistent with occlusion of diagonal artery stent. Due to appropriate medication management and excellent exercise tolerance with no chest pain at peak stress we have discussed further intervention including cardiac catheterization as well as the possibility no intervention at all. He understands the risk and benefits of both approaches and is more willing to continue medication management at this time. If there is further concerns the patient  understands to further discuss the possibility of intervention."  "No further cardiac diagnostics and/or intervention at this time for shortness of breath and/or chest discomfort which appears to be noncardiac in nature and multifactorial at this time. We will continue to follow any future progression of symptoms more consistant with cardiovascular causes and need for further cardiac diagnostics"  Return in about 4 months (around 06/23/2019).   stress echo: 01/2019: ef=45% ABNORMAL STRESS ECHOCARDIOGRAM  ABNORMAL RESTING STUDY, WORSE WITH STRESS NO VALVULAR STENOSIS NOTED;  Last aspirin: 6/21. ekg: nsr;    had lap chole under GETA on 01/31/2019;  Covid: NEG;  Reproductive/Obstetrics                           Anesthesia Physical Anesthesia Plan  ASA: III  Anesthesia Plan: General   Post-op Pain Management:    Induction: Intravenous  PONV Risk Score and Plan: 1 and Propofol infusion and TIVA  Airway Management Planned: Natural Airway  Additional Equipment:   Intra-op Plan:   Post-operative Plan:   Informed Consent: I have reviewed the patients History and Physical, chart, labs and discussed the procedure including the risks, benefits and alternatives for the proposed anesthesia with the patient or authorized representative who has indicated his/her understanding and acceptance.       Plan Discussed with: CRNA  Anesthesia Plan Comments:        Anesthesia Quick Evaluation

## 2019-03-19 ENCOUNTER — Other Ambulatory Visit: Payer: Self-pay

## 2019-03-19 ENCOUNTER — Encounter
Admission: RE | Admit: 2019-03-19 | Discharge: 2019-03-19 | Disposition: A | Discharge status: Home / Self Care | Payer: BLUE CROSS/BLUE SHIELD | Source: Ambulatory Visit | Attending: Gastroenterology | Admitting: Gastroenterology

## 2019-03-19 DIAGNOSIS — Z1159 Encounter for screening for other viral diseases: Secondary | ICD-10-CM | POA: Diagnosis not present

## 2019-03-19 DIAGNOSIS — Z01812 Encounter for preprocedural laboratory examination: Secondary | ICD-10-CM | POA: Diagnosis not present

## 2019-03-20 LAB — NOVEL CORONAVIRUS, NAA (HOSP ORDER, SEND-OUT TO REF LAB; TAT 18-24 HRS): SARS-CoV-2, NAA: NOT DETECTED

## 2019-03-21 NOTE — Discharge Instructions (Signed)
General Anesthesia, Adult, Care After  This sheet gives you information about how to care for yourself after your procedure. Your health care provider may also give you more specific instructions. If you have problems or questions, contact your health care provider.  What can I expect after the procedure?  After the procedure, the following side effects are common:  Pain or discomfort at the IV site.  Nausea.  Vomiting.  Sore throat.  Trouble concentrating.  Feeling cold or chills.  Weak or tired.  Sleepiness and fatigue.  Soreness and body aches. These side effects can affect parts of the body that were not involved in surgery.  Follow these instructions at home:    For at least 24 hours after the procedure:  Have a responsible adult stay with you. It is important to have someone help care for you until you are awake and alert.  Rest as needed.  Do not:  Participate in activities in which you could fall or become injured.  Drive.  Use heavy machinery.  Drink alcohol.  Take sleeping pills or medicines that cause drowsiness.  Make important decisions or sign legal documents.  Take care of children on your own.  Eating and drinking  Follow any instructions from your health care provider about eating or drinking restrictions.  When you feel hungry, start by eating small amounts of foods that are soft and easy to digest (bland), such as toast. Gradually return to your regular diet.  Drink enough fluid to keep your urine pale yellow.  If you vomit, rehydrate by drinking water, juice, or clear broth.  General instructions  If you have sleep apnea, surgery and certain medicines can increase your risk for breathing problems. Follow instructions from your health care provider about wearing your sleep device:  Anytime you are sleeping, including during daytime naps.  While taking prescription pain medicines, sleeping medicines, or medicines that make you drowsy.  Return to your normal activities as told by your health care  provider. Ask your health care provider what activities are safe for you.  Take over-the-counter and prescription medicines only as told by your health care provider.  If you smoke, do not smoke without supervision.  Keep all follow-up visits as told by your health care provider. This is important.  Contact a health care provider if:  You have nausea or vomiting that does not get better with medicine.  You cannot eat or drink without vomiting.  You have pain that does not get better with medicine.  You are unable to pass urine.  You develop a skin rash.  You have a fever.  You have redness around your IV site that gets worse.  Get help right away if:  You have difficulty breathing.  You have chest pain.  You have blood in your urine or stool, or you vomit blood.  Summary  After the procedure, it is common to have a sore throat or nausea. It is also common to feel tired.  Have a responsible adult stay with you for the first 24 hours after general anesthesia. It is important to have someone help care for you until you are awake and alert.  When you feel hungry, start by eating small amounts of foods that are soft and easy to digest (bland), such as toast. Gradually return to your regular diet.  Drink enough fluid to keep your urine pale yellow.  Return to your normal activities as told by your health care provider. Ask your health care   provider what activities are safe for you.  This information is not intended to replace advice given to you by your health care provider. Make sure you discuss any questions you have with your health care provider.  Document Released: 12/20/2000 Document Revised: 04/29/2017 Document Reviewed: 04/29/2017  Elsevier Interactive Patient Education  2019 [employer]

## 2019-03-22 ENCOUNTER — Other Ambulatory Visit: Payer: Self-pay

## 2019-03-22 ENCOUNTER — Encounter
Admission: RE | Disposition: A | Discharge status: Home / Self Care | Payer: Self-pay | Source: Home / Self Care | Attending: Gastroenterology

## 2019-03-22 ENCOUNTER — Ambulatory Visit: Payer: BLUE CROSS/BLUE SHIELD | Admitting: Anesthesiology

## 2019-03-22 ENCOUNTER — Ambulatory Visit
Admission: RE | Admit: 2019-03-22 | Discharge: 2019-03-22 | Disposition: A | Discharge status: Home / Self Care | Payer: BLUE CROSS/BLUE SHIELD | Attending: Gastroenterology | Admitting: Gastroenterology

## 2019-03-22 DIAGNOSIS — K640 First degree hemorrhoids: Secondary | ICD-10-CM | POA: Diagnosis not present

## 2019-03-22 DIAGNOSIS — E119 Type 2 diabetes mellitus without complications: Secondary | ICD-10-CM | POA: Diagnosis not present

## 2019-03-22 DIAGNOSIS — J449 Chronic obstructive pulmonary disease, unspecified: Secondary | ICD-10-CM | POA: Diagnosis not present

## 2019-03-22 DIAGNOSIS — Z794 Long term (current) use of insulin: Secondary | ICD-10-CM | POA: Insufficient documentation

## 2019-03-22 DIAGNOSIS — I714 Abdominal aortic aneurysm, without rupture: Secondary | ICD-10-CM | POA: Diagnosis not present

## 2019-03-22 DIAGNOSIS — I2510 Atherosclerotic heart disease of native coronary artery without angina pectoris: Secondary | ICD-10-CM | POA: Diagnosis not present

## 2019-03-22 DIAGNOSIS — Z888 Allergy status to other drugs, medicaments and biological substances status: Secondary | ICD-10-CM | POA: Diagnosis not present

## 2019-03-22 DIAGNOSIS — Z79899 Other long term (current) drug therapy: Secondary | ICD-10-CM | POA: Insufficient documentation

## 2019-03-22 DIAGNOSIS — Z7982 Long term (current) use of aspirin: Secondary | ICD-10-CM | POA: Diagnosis not present

## 2019-03-22 DIAGNOSIS — Z1211 Encounter for screening for malignant neoplasm of colon: Secondary | ICD-10-CM | POA: Diagnosis not present

## 2019-03-22 DIAGNOSIS — I10 Essential (primary) hypertension: Secondary | ICD-10-CM | POA: Diagnosis not present

## 2019-03-22 DIAGNOSIS — K635 Polyp of colon: Secondary | ICD-10-CM

## 2019-03-22 DIAGNOSIS — Z9049 Acquired absence of other specified parts of digestive tract: Secondary | ICD-10-CM | POA: Diagnosis not present

## 2019-03-22 DIAGNOSIS — I252 Old myocardial infarction: Secondary | ICD-10-CM | POA: Insufficient documentation

## 2019-03-22 DIAGNOSIS — D125 Benign neoplasm of sigmoid colon: Secondary | ICD-10-CM

## 2019-03-22 DIAGNOSIS — F17200 Nicotine dependence, unspecified, uncomplicated: Secondary | ICD-10-CM | POA: Diagnosis not present

## 2019-03-22 HISTORY — PX: COLONOSCOPY WITH PROPOFOL: SHX5780

## 2019-03-22 HISTORY — PX: POLYPECTOMY: SHX5525

## 2019-03-22 LAB — GLUCOSE, CAPILLARY: Glucose-Capillary: 166 mg/dL — ABNORMAL HIGH (ref 70–99)

## 2019-03-22 SURGERY — COLONOSCOPY WITH PROPOFOL
Anesthesia: General

## 2019-03-22 MED ORDER — STERILE WATER FOR IRRIGATION IR SOLN
Status: DC | PRN
  Administered 2019-03-22: 5 mL

## 2019-03-22 MED ORDER — LIDOCAINE HCL (CARDIAC) PF 100 MG/5ML IV SOSY
PREFILLED_SYRINGE | INTRAVENOUS | Status: DC | PRN
  Administered 2019-03-22: 50 mg via INTRAVENOUS

## 2019-03-22 MED ORDER — PROPOFOL 10 MG/ML IV BOLUS
INTRAVENOUS | Status: DC | PRN
  Administered 2019-03-22: 100 mg via INTRAVENOUS
  Administered 2019-03-22: 200 mg via INTRAVENOUS
  Administered 2019-03-22 (×2): 100 mg via INTRAVENOUS

## 2019-03-22 MED ORDER — GLYCOPYRROLATE 0.2 MG/ML IJ SOLN
INTRAMUSCULAR | Status: DC | PRN
  Administered 2019-03-22: 0.1 mg via INTRAVENOUS

## 2019-03-22 MED ORDER — DEXAMETHASONE SODIUM PHOSPHATE 4 MG/ML IJ SOLN
INTRAMUSCULAR | Status: DC | PRN
  Administered 2019-03-22: 6 mg via INTRAVENOUS

## 2019-03-22 MED ORDER — LACTATED RINGERS IV SOLN
INTRAVENOUS | Status: DC
  Administered 2019-03-22: 08:00:00 via INTRAVENOUS

## 2019-03-22 SURGICAL SUPPLY — 24 items
CANISTER SUCT 1200ML W/VALVE (MISCELLANEOUS) ×3 IMPLANT
CLIP HMST 235XBRD CATH ROT (MISCELLANEOUS) IMPLANT
CLIP RESOLUTION 360 11X235 (MISCELLANEOUS)
ELECT REM PT RETURN 9FT ADLT (ELECTROSURGICAL)
ELECTRODE REM PT RTRN 9FT ADLT (ELECTROSURGICAL) IMPLANT
FCP ESCP3.2XJMB 240X2.8X (MISCELLANEOUS) ×2
FORCEPS BIOP RAD 4 LRG CAP 4 (CUTTING FORCEPS) IMPLANT
FORCEPS BIOP RJ4 240 W/NDL (MISCELLANEOUS) ×1
FORCEPS ESCP3.2XJMB 240X2.8X (MISCELLANEOUS) IMPLANT
GOWN CVR UNV OPN BCK APRN NK (MISCELLANEOUS) ×4 IMPLANT
GOWN ISOL THUMB LOOP REG UNIV (MISCELLANEOUS) ×2
INJECTOR VARIJECT VIN23 (MISCELLANEOUS) IMPLANT
KIT DEFENDO VALVE AND CONN (KITS) IMPLANT
KIT ENDO PROCEDURE OLY (KITS) ×3 IMPLANT
MARKER SPOT ENDO TATTOO 5ML (MISCELLANEOUS) IMPLANT
PROBE APC STR FIRE (PROBE) IMPLANT
RETRIEVER NET ROTH 2.5X230 LF (MISCELLANEOUS) IMPLANT
SNARE SHORT THROW 13M SML OVAL (MISCELLANEOUS) IMPLANT
SNARE SHORT THROW 30M LRG OVAL (MISCELLANEOUS) IMPLANT
SNARE SNG USE RND 15MM (INSTRUMENTS) IMPLANT
SPOT EX ENDOSCOPIC TATTOO (MISCELLANEOUS)
TRAP ETRAP POLY (MISCELLANEOUS) IMPLANT
VARIJECT INJECTOR VIN23 (MISCELLANEOUS)
WATER STERILE IRR 250ML POUR (IV SOLUTION) ×3 IMPLANT

## 2019-03-22 NOTE — Anesthesia Postprocedure Evaluation (Signed)
Anesthesia Post Note  Patient: Scott Rosario  Procedure(s) Performed: COLONOSCOPY WITH PROPOFOL (N/A )  Patient location during evaluation: PACU Anesthesia Type: General Level of consciousness: awake and alert Pain management: pain level controlled Vital Signs Assessment: post-procedure vital signs reviewed and stable Respiratory status: spontaneous breathing, nonlabored ventilation, respiratory function stable and patient connected to nasal cannula oxygen Cardiovascular status: blood pressure returned to baseline and stable Postop Assessment: no apparent nausea or vomiting Anesthetic complications: no    Imir Brumbach

## 2019-03-22 NOTE — Interval H&P Note (Signed)
History and Physical Interval Note:  03/22/2019 7:58 AM  Scott Rosario  has presented today for surgery, with the diagnosis of Screening Z12.11.  The various methods of treatment have been discussed with the patient and family. After consideration of risks, benefits and other options for treatment, the patient has consented to  Procedure(s) with comments: COLONOSCOPY WITH PROPOFOL (N/A) - Diabetic - insulin and oral meds as a surgical intervention.  The patient's history has been reviewed, patient examined, no change in status, stable for surgery.  I have reviewed the patient's chart and labs.  Questions were answered to the patient's satisfaction.     Shalaine Payson [employer]

## 2019-03-22 NOTE — Op Note (Signed)
[facility] Gastroenterology Patient Name: Scott Rosario Procedure Date: 03/22/2019 8:20 AM MRN: [id number] Account #: [id number] Date of Birth: [birth year] Admit Type: Outpatient Age: 58 Room: [facility] OR ROOM 01 Gender: Male Note Status: Finalized Procedure:            Colonoscopy Indications:          Screening for colorectal malignant neoplasm Providers:            Lucilla Lame MD, MD Referring MD:         Arnetha Courser (Referring MD) Medicines:            Propofol per Anesthesia Complications:        No immediate complications. Procedure:            Pre-Anesthesia Assessment:                       - Prior to the procedure, a History and Physical was                        performed, and patient medications and allergies were                        reviewed. The patient's tolerance of previous                        anesthesia was also reviewed. The risks and benefits of                        the procedure and the sedation options and risks were                        discussed with the patient. All questions were                        answered, and informed consent was obtained. Prior                        Anticoagulants: The patient has taken no previous                        anticoagulant or antiplatelet agents. ASA Grade                        Assessment: II - A patient with mild systemic disease.                        After reviewing the risks and benefits, the patient was                        deemed in satisfactory condition to undergo the                        procedure.                       After obtaining informed consent, the colonoscope was                        passed under direct vision. Throughout the procedure,  the patient's blood pressure, pulse, and oxygen                        saturations were monitored continuously. The                        Colonoscope was introduced through the anus and     advanced to the the cecum, identified by appendiceal                        orifice and ileocecal valve. The colonoscopy was                        performed without difficulty. The patient tolerated the                        procedure well. The quality of the bowel preparation                        was excellent. Findings:      The perianal and digital rectal examinations were normal.      Two sessile polyps were found in the sigmoid colon. The polyps were 2 to       3 mm in size. These polyps were removed with a cold biopsy forceps.       Resection and retrieval were complete.      Non-bleeding internal hemorrhoids were found during retroflexion. The       hemorrhoids were Grade I (internal hemorrhoids that do not prolapse). Impression:           - Two 2 to 3 mm polyps in the sigmoid colon, removed                        with a cold biopsy forceps. Resected and retrieved.                       - Non-bleeding internal hemorrhoids. Recommendation:       - Discharge patient to home.                       - Resume previous diet.                       - Continue present medications.                       - Await pathology results.                       - Repeat colonoscopy in 5 years if polyp adenoma and 10                        years if hyperplastic Procedure Code(s):    --- Professional ---                       [phone], Colonoscopy, flexible; with biopsy, single or                        multiple Diagnosis Code(s):    --- Professional ---  Z12.11, Encounter for screening for malignant neoplasm                        of colon                       K63.5, Polyp of colon CPT copyright 2019 [employer]. All rights reserved. The codes documented in this report are preliminary and upon coder review may  be revised to meet current compliance requirements. Lucilla Lame MD, MD 03/22/2019 8:43:36 AM This report has been signed electronically. Number of  Addenda: 0 Note Initiated On: 03/22/2019 8:20 AM Scope Withdrawal Time: 0 hours 6 minutes 33 seconds  Total Procedure Duration: 0 hours 9 minutes 19 seconds  Estimated Blood Loss: Estimated blood loss: none.      [employer]

## 2019-03-22 NOTE — Transfer of Care (Signed)
Immediate Anesthesia Transfer of Care Note  Patient: Scott Rosario  Procedure(s) Performed: COLONOSCOPY WITH PROPOFOL (N/A )  Patient Location: PACU  Anesthesia Type: General  Level of Consciousness: awake, alert  and patient cooperative  Airway and Oxygen Therapy: Patient Spontanous Breathing and Patient connected to supplemental oxygen  Post-op Assessment: Post-op Vital signs reviewed, Patient's Cardiovascular Status Stable, Respiratory Function Stable, Patent Airway and No signs of Nausea or vomiting  Post-op Vital Signs: Reviewed and stable  Complications: No apparent anesthesia complications

## 2019-03-22 NOTE — Anesthesia Procedure Notes (Signed)
Procedure Name: General with mask airway Performed by: Izetta Dakin, CRNA Pre-anesthesia Checklist: Timeout performed, Patient being monitored, Suction available, Emergency Drugs available and Patient identified Patient Re-evaluated:Patient Re-evaluated prior to induction Oxygen Delivery Method: Nasal cannula

## 2019-03-23 ENCOUNTER — Encounter: Payer: Self-pay | Admitting: Gastroenterology

## 2019-03-26 ENCOUNTER — Encounter: Payer: Self-pay | Admitting: Gastroenterology

## 2019-04-19 ENCOUNTER — Other Ambulatory Visit: Payer: Self-pay | Admitting: Nurse Practitioner

## 2019-04-19 DIAGNOSIS — E119 Type 2 diabetes mellitus without complications: Secondary | ICD-10-CM

## 2019-06-06 ENCOUNTER — Encounter: Payer: Self-pay | Admitting: Family Medicine

## 2019-06-06 ENCOUNTER — Ambulatory Visit (INDEPENDENT_AMBULATORY_CARE_PROVIDER_SITE_OTHER): Payer: BLUE CROSS/BLUE SHIELD | Admitting: Family Medicine

## 2019-06-06 ENCOUNTER — Other Ambulatory Visit: Payer: Self-pay

## 2019-06-06 VITALS — BP 136/64 | HR 100 | Temp 97.9°F | Resp 14 | Ht 68.0 in | Wt 167.2 lb

## 2019-06-06 DIAGNOSIS — F411 Generalized anxiety disorder: Secondary | ICD-10-CM

## 2019-06-06 DIAGNOSIS — J439 Emphysema, unspecified: Secondary | ICD-10-CM | POA: Diagnosis not present

## 2019-06-06 DIAGNOSIS — E1165 Type 2 diabetes mellitus with hyperglycemia: Secondary | ICD-10-CM

## 2019-06-06 DIAGNOSIS — Z5181 Encounter for therapeutic drug level monitoring: Secondary | ICD-10-CM

## 2019-06-06 DIAGNOSIS — Z720 Tobacco use: Secondary | ICD-10-CM

## 2019-06-06 DIAGNOSIS — F430 Acute stress reaction: Secondary | ICD-10-CM

## 2019-06-06 DIAGNOSIS — E782 Mixed hyperlipidemia: Secondary | ICD-10-CM | POA: Diagnosis not present

## 2019-06-06 DIAGNOSIS — G4700 Insomnia, unspecified: Secondary | ICD-10-CM

## 2019-06-06 DIAGNOSIS — R634 Abnormal weight loss: Secondary | ICD-10-CM

## 2019-06-06 DIAGNOSIS — I10 Essential (primary) hypertension: Secondary | ICD-10-CM | POA: Diagnosis not present

## 2019-06-06 DIAGNOSIS — N529 Male erectile dysfunction, unspecified: Secondary | ICD-10-CM

## 2019-06-06 MED ORDER — BUSPIRONE HCL 7.5 MG PO TABS: 7.5000 mg | ORAL_TABLET | Freq: Three times a day (TID) | ORAL | 1 refills | Status: DC

## 2019-06-06 MED ORDER — HYDROXYZINE PAMOATE 25 MG PO CAPS: 25.0000 mg | ORAL_CAPSULE | Freq: Every day | ORAL | 1 refills | Status: DC

## 2019-06-06 MED ORDER — INSULIN PEN NEEDLE 32G X 4 MM MISC: 1 refills | Status: AC

## 2019-06-06 NOTE — Assessment & Plan Note (Signed)
Not currently using SABA, he denies DOE, wheeze, cough Discussed chronic lung disease sx, stages and management, also discussed increased stress on heart with lung disease.  Encouraged him to notify us if breathing sx and ability to do activity changes

## 2019-06-06 NOTE — Assessment & Plan Note (Signed)
Slightly higher than goal and pts baseline, but he is very anxious today, usually well controlled on multiple meds - pt to monitor and notify us if persistantly elevated For now recheck labs, continue current meds, tx anxiety and will see pt back on 1 month

## 2019-06-06 NOTE — Assessment & Plan Note (Signed)
no desire to quit, discussed smoking cessation, but pt has no interest right now

## 2019-06-06 NOTE — Assessment & Plan Note (Signed)
Compliant with meds, no myalgias Recheck FLP and CMP

## 2019-06-06 NOTE — Progress Notes (Signed)
Name: Scott Rosario   MRN: [id number]    DOB: 12-Jun-1961   Date:06/06/2019       Progress Note  Chief Complaint  Patient presents with  . Follow-up  . Hypertension  . Gastroesophageal Reflux  . Hyperlipidemia     Subjective:   Scott Rosario is a 58 y.o. male, presents to clinic for routine follow up on the conditions listed above.  Hyperlipidemia:  Current Medication Regimen: lipitor 80 Last Lipids: Lab Results  Component Value Date   CHOL 100 11/29/2018   HDL 28 (L) 11/29/2018   LDLCALC 54 11/29/2018   TRIG 94 11/29/2018   CHOLHDL 3.6 11/29/2018   - Current Diet:  Decreased appetite lately due to stress and anxiety - not working on any dietary efforts - Reports: Chest pain - seeing cardiology for CAD and unstable angina currently medically managing -Denies:  shortness of breath, myalgias. - Documented aortic atherosclerosis? Yes - Risk factors for atherosclerosis: arteriosclerotic heart disease, diabetes mellitus, hypercholesterolemia, hypertension and smoking  Diabetes Mellitus Type II:  Sees endocrinology?  Overdue to see them  Currently poorly controlled  Currently managing with lantus 12 units, glipizide 10 mg daily and metformin Xr 1000 mg BID (pt new to me) Fasting CBGs in am are in the 200's Pt notes good med compliance Pt has no SE from meds. No hypoglycemic episodes Denies: Polyuria, polydipsia, polyphagia, vision changes, or neuropathy - some nocturia  Recent pertinent labs: Lab Results  Component Value Date   HGBA1C 8.6 (H) 11/29/2018   HGBA1C 8.3 (H) 07/25/2018   HGBA1C 8.7 (H) 04/03/2018      Component Value Date/Time   NA 137 02/02/2019 1459   NA 138 02/17/2016 0940   K 4.0 02/02/2019 1459   CL 104 02/02/2019 1459   CO2 23 02/02/2019 1459   GLUCOSE 105 (H) 02/02/2019 1459   BUN 15 02/02/2019 1459   BUN 14 02/17/2016 0940   CREATININE 0.68 02/02/2019 1459   CREATININE 0.81 11/29/2018 0000   CALCIUM 8.6 (L) 02/02/2019 1459   PROT  6.2 02/28/2019 1013   ALBUMIN 4.3 02/28/2019 1013   AST 17 02/28/2019 1013   ALT 19 02/28/2019 1013   ALKPHOS 148 (H) 02/28/2019 1013   BILITOT 0.9 02/28/2019 1013   GFRNONAA >60 02/02/2019 1459   GFRNONAA 97 11/29/2018 0000   GFRAA >60 02/02/2019 1459   GFRAA 113 11/29/2018 0000    UTD on DM foot exam and eye exam ACEI/ARB: Yes Statin: Yes  Hypertension:  Currently managed on losartan, metoprolol, imdur Hx of CAD, prior MI s/p PCI with DES proximal first diagonal branch Pt reports good med compliance and denies any SE.  No lightheadedness, hypotension, syncope. Blood pressure today is mildly elevated but pt is very anxious today BP Readings from Last 3 Encounters:  06/06/19 (!) 146/66  03/22/19 110/72  03/05/19 118/70   Pt denies SOB, exertional sx, LE edema, palpitation, Ha's, visual disturbances Dietary efforts for BP? None currently  COPD:  Pt has 45 pack yr hx but denies any daily chronic cough, pleuritic CP, recurrent bronchitis, wheeze, SOB.  He uses albuterol inhaler very infrequency and denies exertional dyspnea  Weight:  Unintentional weight loss  Wt Readings from Last 5 Encounters:  06/06/19 167 lb 3.2 oz (75.8 kg)  03/22/19 179 lb (81.2 kg)  03/05/19 177 lb 3.2 oz (80.4 kg)  02/16/19 185 lb (83.9 kg)  02/02/19 187 lb (84.8 kg)   BMI Readings from Last 5 Encounters:  06/06/19 25.42  kg/m  03/22/19 27.22 kg/m  03/05/19 26.94 kg/m  02/16/19 28.13 kg/m  02/02/19 28.43 kg/m   Stress/insomnia -  Unintentional weight loss pt attributes to situational stress with separation from wife of 28 years, and PHQ screening is positive today.  Pt mostly endorses anxiety sx, denies SI, HI, AVH. 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  Depression screen [facility] 2/9 06/06/2019 03/05/2019 12/21/2018  Decreased Interest 1 0 0  Down,  Depressed, Hopeless 1 0 0  PHQ - 2 Score 2 0 0  Altered sleeping 3 0 0  Tired, decreased energy 2 0 0  Change in appetite 3 0 0  Feeling bad or failure about yourself  1 0 0  Trouble concentrating 2 0 0  Moving slowly or fidgety/restless 0 0 0  Suicidal thoughts 0 0 0  PHQ-9 Score 13 0 0  Difficult doing work/chores Somewhat difficult Not difficult at all Not difficult at all   2 months of worsening sx, only sleeping 2 hours a night or so Usually goes to bed at 10:30 pm, now going to bed at 12 or 1 and up at 3 am - his mind always going, when he wakes up there is nothing really on his mind but he does feel like he has to tackle and deal with    Also having angina with his anxiety sx - he sees cardiology - they have worked up the heart and Dr. Adolph Pollack concluded its stress and not his heart.  He would like to try something to help with his sx, but he thinks things will pass and he'll be fine in a few months.  Doesn't really want to be on any long term meds.  And when discussing going to a counselor or therapist for talk therapy or cognitive behavioral therapy patient states that he has really good coping skills and he will probably feel better in a few weeks.  He does not endorse most interest of going to a counselor or therapist.  Also endorses that he is not a "good pill taker"  Pt also wants to address ED and decreased libido he states that he has trouble obtaining an erection and would like to start medications for this.    Patient Active Problem List   Diagnosis Date Noted  . Special screening for malignant neoplasms, colon   . Polyp of sigmoid colon   . Aortic aneurysm ([place]) 12/10/2018  . Gallstone 12/10/2018  . Ingrown toenail of both feet 11/29/2018  . Senile purpura ([place]) 07/25/2018  . Overweight (BMI 25.0-29.9) 12/20/2017  . Gastritis without bleeding   . Acquired trigger finger 02/15/2017  . COPD (chronic obstructive pulmonary disease) ([place])   . Diabetes type 2,  uncontrolled ([place])   . GERD (gastroesophageal reflux disease)   . Hyperlipidemia   . Hypertension   . Tobacco use   . CAD (coronary artery disease)   . Cirrhosis ([place])   . Aortic atherosclerosis ([place])   . Multiple pulmonary nodules determined by computed tomography of lung 12/31/2014    Past Surgical History:  Procedure Laterality Date  . APPENDECTOMY    . CARDIAC CATHETERIZATION Left 03/09/2016   Procedure: Left Heart Cath and Coronary Angiography;  Surgeon: Corey Skains, MD;  Location: [place] [facility];  Service: Cardiovascular;  Laterality: Left;  . CARDIAC CATHETERIZATION N/A 03/09/2016   Procedure: Coronary Stent Intervention;  Surgeon: Isaias Cowman, MD;  Location: [place] [facility];  Service: Cardiovascular;  Laterality: N/A;  . CHOLECYSTECTOMY N/A 01/31/2019   Procedure: LAPAROSCOPIC CHOLECYSTECTOMY - DIABETIC;  Surgeon: Vickie Epley, MD;  Location: [facility];  Service: General;  Laterality: N/A;  . COLONOSCOPY WITH PROPOFOL N/A 03/22/2019   Procedure: COLONOSCOPY WITH PROPOFOL;  Surgeon: Lucilla Lame, MD;  Location: [place];  Service: Endoscopy;  Laterality: N/A;  Diabetic - insulin and oral meds  . CORONARY ANGIOPLASTY WITH STENT PLACEMENT  10/25/2003   x 2  . ESOPHAGOGASTRODUODENOSCOPY (EGD) WITH PROPOFOL N/A 10/24/2017   Procedure: ESOPHAGOGASTRODUODENOSCOPY (EGD) WITH PROPOFOL;  Surgeon: Lucilla Lame, MD;  Location: [place];  Service: Endoscopy;  Laterality: N/A;  Diabetic - oral meds  . POLYPECTOMY  03/22/2019   Procedure: POLYPECTOMY;  Surgeon: Lucilla Lame, MD;  Location: [place];  Service: Endoscopy;;    Family History  Problem Relation Age of Onset  . Diabetes Mother   . Hypertension Mother   . Arthritis Mother   . Hyperlipidemia Mother   . Heart attack Father   . Epilepsy Father   . Diabetes Father   . Seizures Father   . Heart disease Father   . Hyperlipidemia Father   . Hypertension Father   . Stroke  Father   . Diabetes Brother   . Hyperlipidemia Brother   . Hypertension Brother   . Cancer Maternal Grandmother        unknown  . Diabetes Paternal Grandmother   . Heart disease Paternal Grandmother   . Hyperlipidemia Paternal Grandmother   . Hypertension Paternal Grandmother   . Heart attack Paternal Grandfather     Social History   Socioeconomic History  . Marital status: Married    Spouse name: Not on file  . Number of children: Not on file  . Years of education: Not on file  . Highest education level: Not on file  Occupational History  . Not on file  Social Needs  . Financial resource strain: Not on file  . Food insecurity    Worry: Not on file    Inability: Not on file  . Transportation needs    Medical: Not on file    Non-medical: Not on file  Tobacco Use  . Smoking status: Current Every Day Smoker    Packs/day: 1.00    Years: 45.00    Pack years: 45.00    Types: Cigarettes  . Smokeless tobacco: Never Used  . Tobacco comment: since age 62  Substance and Sexual Activity  . Alcohol use: No    Alcohol/week: 0.0 standard drinks  . Drug use: No  . Sexual activity: Yes  Lifestyle  . Physical activity    Days per week: Not on file    Minutes per session: Not on file  . Stress: Not on file  Relationships  . Social Herbalist on phone: Not on file    Gets together: Not on file    Attends religious service: Not on file    Active member of club or organization: Not on file    Attends meetings of clubs or organizations: Not on file    Relationship status: Not on file  . Intimate partner violence    Fear of current or ex partner: Not on file    Emotionally abused: Not on file    Physically abused: Not on file    Forced sexual activity: Not on file  Other Topics Concern  .  Not on file  Social History Narrative   Active and independent at baseline     Current Outpatient Medications:  .  aspirin EC 81 MG tablet, Take 81 mg by mouth daily., Disp: ,  Rfl:  .  atorvastatin (LIPITOR) 80 MG tablet, Take 1 tablet (80 mg total) by mouth at bedtime., Disp: 90 tablet, Rfl: 1 .  glipiZIDE (GLUCOTROL XL) 10 MG 24 hr tablet, TAKE 1 TABLET BY MOUTH ONCE DAILY, Disp: 90 tablet, Rfl: 1 .  Insulin Glargine (LANTUS SOLOSTAR) 100 UNIT/ML Solostar Pen, Inject 12 Units into the skin at bedtime., Disp: 15 mL, Rfl: 2 .  isosorbide mononitrate (IMDUR) 30 MG 24 hr tablet, Take 1 tablet (30 mg total) by mouth daily., Disp: 30 tablet, Rfl: 1 .  losartan (COZAAR) 50 MG tablet, Take 1 tablet (50 mg total) by mouth every evening., Disp: 90 tablet, Rfl: 1 .  metFORMIN (GLUCOPHAGE) 500 MG tablet, Take 2 tablets (1,000 mg total) by mouth 2 (two) times daily with a meal., Disp: 180 tablet, Rfl: 2 .  metoprolol succinate (TOPROL-XL) 25 MG 24 hr tablet, Take 1 tablet (25 mg total) by mouth every morning., Disp: 90 tablet, Rfl: 1 .  nitroGLYCERIN (NITROSTAT) 0.4 MG SL tablet, Place 1 tablet (0.4 mg total) under the tongue every 5 (five) minutes as needed for chest pain. Max of 3 total; call 911, Disp: 25 tablet, Rfl: 5 .  pantoprazole (PROTONIX) 40 MG tablet, Take 1 tablet (40 mg total) by mouth 2 (two) times daily., Disp: 180 tablet, Rfl: 1 .  ursodiol (ACTIGALL) 300 MG capsule, Take 2 capsules (600 mg total) by mouth 3 (three) times daily. (Patient taking differently: Take 600 mg by mouth 2 (two) times daily. ), Disp: 180 capsule, Rfl: 3 .  albuterol (PROVENTIL HFA;VENTOLIN HFA) 108 (90 Base) MCG/ACT inhaler, Inhale 2 puffs into the lungs every 4 (four) hours as needed for wheezing or shortness of breath. (Patient not taking: Reported on 06/06/2019), Disp: 1 Inhaler, Rfl: 1 .  NOVOLOG FLEXPEN 100 UNIT/ML FlexPen, as needed. , Disp: , Rfl:   Allergies  Allergen Reactions  . Canagliflozin Other (See Comments)    adverse reactions: Fatigue and Zombie feeling   . Semaglutide Diarrhea and Nausea And Vomiting    Ozempic  Acid Reflux  . Tape     SURGICAL TAPE-RIPPED SKIN OFF     I personally reviewed active problem list, medication list, allergies, family history, social history, notes from last encounter, lab results with the patient/caregiver today.  Review of Systems  Constitutional: Negative for activity change, chills, diaphoresis, fatigue and fever.  HENT: Negative.   Eyes: Negative.   Respiratory: Negative.  Negative for cough, chest tightness, shortness of breath and wheezing.   Cardiovascular: Negative.  Negative for palpitations and leg swelling.  Gastrointestinal: Negative.   Endocrine: Negative.   Genitourinary: Negative.   Musculoskeletal: Negative.   Skin: Negative.  Negative for color change and pallor.  Allergic/Immunologic: Negative.   Neurological: Negative.   Hematological: Negative.   Psychiatric/Behavioral: Positive for dysphoric mood and sleep disturbance. Negative for suicidal ideas. The patient is nervous/anxious.   All other systems reviewed and are negative.    Objective:    Vitals:   06/06/19 0832  BP: (!) 146/66  Pulse: 100  Resp: 14  Temp: 97.9 F (36.6 C)  SpO2: 96%  Weight: 167 lb 3.2 oz (75.8 kg)  Height: 5\' 8"  (1.727 m)    Body mass index is 25.42 kg/m.  Physical Exam  Vitals signs and nursing note reviewed.  Constitutional:      General: He is not in acute distress.    Appearance: Normal appearance. He is well-developed and normal weight. He is not ill-appearing, toxic-appearing or diaphoretic.  HENT:     Head: Normocephalic and atraumatic.     Jaw: No trismus.     Right Ear: External ear normal.     Left Ear: External ear normal.     Nose: No mucosal edema.     Right Sinus: No maxillary sinus tenderness or frontal sinus tenderness.     Left Sinus: No maxillary sinus tenderness or frontal sinus tenderness.     Mouth/Throat:     Mouth: Mucous membranes are moist.     Pharynx: Oropharynx is clear. Uvula midline. No oropharyngeal exudate, posterior oropharyngeal erythema or uvula swelling.  Eyes:      General: Lids are normal. No scleral icterus.    Conjunctiva/sclera: Conjunctivae normal.     Pupils: Pupils are equal, round, and reactive to light.  Neck:     Musculoskeletal: Normal range of motion and neck supple.     Trachea: Trachea and phonation normal. No tracheal deviation.  Cardiovascular:     Rate and Rhythm: Regular rhythm.     Pulses: Normal pulses.          Radial pulses are 2+ on the right side and 2+ on the left side.       Posterior tibial pulses are 2+ on the right side and 2+ on the left side.     Heart sounds: Normal heart sounds. No murmur. No friction rub. No gallop.   Pulmonary:     Effort: Pulmonary effort is normal.     Breath sounds: Normal breath sounds. No wheezing, rhonchi or rales.  Abdominal:     General: Bowel sounds are normal. There is no distension.     Palpations: Abdomen is soft.     Tenderness: There is no abdominal tenderness. There is no guarding or rebound.  Musculoskeletal: Normal range of motion.  Skin:    General: Skin is warm and dry.     Capillary Refill: Capillary refill takes less than 2 seconds.     Coloration: Skin is not jaundiced or pale.     Findings: No rash.  Neurological:     Mental Status: He is alert and oriented to person, place, and time.     Gait: Gait normal.  Psychiatric:        Mood and Affect: Mood normal.        Speech: Speech normal.        Behavior: Behavior normal.      Recent Results (from the past 2160 hour(s))  Novel Coronavirus, NAA (hospital order; send-out to ref lab)     Status: None   Collection Time: 03/19/19 11:22 AM   Specimen: Nasopharyngeal Swab; Respiratory  Result Value Ref Range   SARS-CoV-2, NAA NOT DETECTED NOT DETECTED    Comment: (NOTE) This test was developed and its performance characteristics determined by [employer]. This test has not been FDA cleared or approved. This test has been authorized by FDA under an Emergency Use Authorization (EUA). This test is only  authorized for the duration of time the declaration that circumstances exist justifying the authorization of the emergency use of in vitro diagnostic tests for detection of SARS-CoV-2 virus and/or diagnosis of COVID-19 infection under section 564(b)(1) of the Act, 21 U.S.C. KA:123727), unless the authorization is terminated or revoked sooner.  When diagnostic testing is negative, the possibility of a false negative result should be considered in the context of a patient's recent exposures and the presence of clinical signs and symptoms consistent with COVID-19. An individual without symptoms of COVID-19 and who is not shedding SARS-CoV-2 virus would expect to have a negative (not detected) result in this assay. Performed  At: [facility] [address] [place], [place] [id number] Rush Farmer MD [id number]    Coronavirus Source NASOPHARYNGEAL     Comment: Performed at [facility], [address] [place], [place] [address]  Glucose, capillary     Status: Abnormal   Collection Time: 03/22/19  7:38 AM  Result Value Ref Range   Glucose-Capillary 166 (H) 70 - 99 mg/dL    Diabetic Foot Exam: Diabetic Foot Exam - Simple   No data filed       PHQ2/9: Depression screen [facility] 2/9 06/06/2019 03/05/2019 12/21/2018 11/29/2018 07/25/2018  Decreased Interest 1 0 0 0 0  Down, Depressed, Hopeless 1 0 0 0 0  PHQ - 2 Score 2 0 0 0 0  Altered sleeping 3 0 0 0 -  Tired, decreased energy 2 0 0 0 -  Change in appetite 3 0 0 0 -  Feeling bad or failure about yourself  1 0 0 0 -  Trouble concentrating 2 0 0 0 -  Moving slowly or fidgety/restless 0 0 0 0 -  Suicidal thoughts 0 0 0 0 -  PHQ-9 Score 13 0 0 0 -  Difficult doing work/chores Somewhat difficult Not difficult at all Not difficult at all Not difficult at all -    phq 9 is positive Discussed at length today  Fall Risk: Fall Risk  06/06/2019 03/05/2019 01/18/2019 01/17/2019 12/21/2018  Falls in the past year? 0 0 0 0 0   Number falls in past yr: 0 - - 0 -  Injury with Fall? 0 - - 0 -      Functional Status Survey: Is the patient deaf or have difficulty hearing?: No Does the patient have difficulty seeing, even when wearing glasses/contacts?: No Does the patient have difficulty concentrating, remembering, or making decisions?: No Does the patient have difficulty walking or climbing stairs?: No Does the patient have difficulty dressing or bathing?: No Does the patient have difficulty doing errands alone such as visiting a doctor's office or shopping?: No    Assessment & Plan:   Problem List Items Addressed This Visit      Cardiovascular and Mediastinum   Hypertension (Chronic)    Slightly higher than goal and pts baseline, but he is very anxious today, usually well controlled on multiple meds - pt to monitor and notify us if persistantly elevated For now recheck labs, continue current meds, tx anxiety and will see pt back on 1 month      Relevant Orders   COMPLETE METABOLIC PANEL WITH GFR     Respiratory   COPD (chronic obstructive pulmonary disease) (HCC) (Chronic)    Not currently using SABA, he denies DOE, wheeze, cough Discussed chronic lung disease sx, stages and management, also discussed increased stress on heart with lung disease.  Encouraged him to notify us if breathing sx and ability to do activity changes         Endocrine   Diabetes type 2, uncontrolled ([place])    Pt uncontrolled with fasting CBG 200's on average Pt new to me, did not bring meter, he sees endocrinology but has not f/up as previously  directed so will recheck labs today and titrate lantus slowly to get fasting CBG closer to 100-150 One month recheck on DM if not going to endocrine      Relevant Medications   Insulin Pen Needle 32G X 4 MM MISC   Other Relevant Orders   Hemoglobin A1c   COMPLETE METABOLIC PANEL WITH GFR     Other   Hyperlipidemia - Primary (Chronic)    Compliant with meds, no myalgias  Recheck FLP and CMP      Relevant Orders   Lipid panel   COMPLETE METABOLIC PANEL WITH GFR   Tobacco use (Chronic)    no desire to quit, discussed smoking cessation, but pt has no interest right now       Other Visit Diagnoses    Erectile dysfunction, unspecified erectile dysfunction type       Relevant Orders   Testosterone   Medication monitoring encounter       Relevant Orders   Hemoglobin A1c   Lipid panel   COMPLETE METABOLIC PANEL WITH GFR   CBC with Differential/Platelet (Completed)   Anxiety as acute reaction to exceptional stress       buspar for sx, pt refused SSRIs and referral to therapy/specialist   Relevant Medications   busPIRone (BUSPAR) 7.5 MG tablet   hydrOXYzine (VISTARIL) 25 MG capsule   Insomnia, unspecified type       trial of vistaril for insomnia   Relevant Medications   hydrOXYzine (VISTARIL) 25 MG capsule   Unintentional weight loss       attributed to stress/anxiety/change to appetite - monitor for further weight loss and would want to r/o metabolic or malignant causes, tx anxiety       Return in about 1 month (around 07/06/2019) for recheck anxiety and discuss decreased libido/ED.    Greater than 50% of this visit was spent in direct face-to-face counseling, obtaining history and physical, discussing and educating pt on treatment plan.  Total time of this visit was 67.  Remainder of time involved but was not limited to reviewing chart (recent and pertinent OV notes and labs), documentation in EMR, and coordinating care and treatment plan. Length of visit was due to scheduling of f/up for chronic conditions for a routine visit but pt had several acute issues to address which required more indepth history and counseling about treatment options, medication side effects.  Additionally pt is new to me and extensive chart review with recent hospitalization, recent surgery and multiple specialist involved in care - all of which had to be reviewed.      Delsa Grana, PA-C 06/06/19 8:37 AM

## 2019-06-06 NOTE — Assessment & Plan Note (Signed)
Pt uncontrolled with fasting CBG 200's on average Pt new to me, did not bring meter, he sees endocrinology but has not f/up as previously directed so will recheck labs today and titrate lantus slowly to get fasting CBG closer to 100-150 One month recheck on DM if not going to endocrine

## 2019-06-06 NOTE — Patient Instructions (Addendum)
Increase lantus to 16 units at night and watch your fasting morning blood sugars for a week. Goal is to be around 100 in the morning (normal is 70-100, goal for you would be 100-130)  If after a week of doing 16 units at night, your blood sugar is still over 150, then increase to 20 units at night and continue that for the next week.  Bring your meter in next month so I can check your sugars.

## 2019-06-07 LAB — LIPID PANEL
Cholesterol: 86 mg/dL (ref <200)
HDL: 29 mg/dL — ABNORMAL LOW (ref >=40)
LDL Cholesterol (Calc): 39 mg/dL (calc)
Non-HDL Cholesterol (Calc): 57 mg/dL (calc) (ref <130)
Total CHOL/HDL Ratio: 3 (calc) (ref <5.0)
Triglycerides: 96 mg/dL (ref <150)

## 2019-06-07 LAB — HEMOGLOBIN A1C
Hgb A1c MFr Bld: 6.5 % of total Hgb — ABNORMAL HIGH (ref <5.7)
Mean Plasma Glucose: 140 (calc)
eAG (mmol/L): 7.7 (calc)

## 2019-06-07 LAB — COMPLETE METABOLIC PANEL WITH GFR
AG Ratio: 2.2 (calc) (ref 1.0–2.5)
ALT: 15 U/L (ref 9–46)
AST: 11 U/L (ref 10–35)
Albumin: 4.1 g/dL (ref 3.6–5.1)
Alkaline phosphatase (APISO): 131 U/L (ref 35–144)
BUN: 7 mg/dL (ref 7–25)
CO2: 28 mmol/L (ref 20–32)
Calcium: 9.3 mg/dL (ref 8.6–10.3)
Chloride: 103 mmol/L (ref 98–110)
Creat: 0.75 mg/dL (ref 0.70–1.33)
GFR, Est African American: 116 mL/min/{1.73_m2} (ref >=60)
GFR, Est Non African American: 100 mL/min/{1.73_m2} (ref >=60)
Globulin: 1.9 g/dL (calc) (ref 1.9–3.7)
Glucose, Bld: 158 mg/dL — ABNORMAL HIGH (ref 65–99)
Potassium: 4.3 mmol/L (ref 3.5–5.3)
Sodium: 138 mmol/L (ref 135–146)
Total Bilirubin: 1.1 mg/dL (ref 0.2–1.2)
Total Protein: 6 g/dL — ABNORMAL LOW (ref 6.1–8.1)

## 2019-06-07 LAB — CBC WITH DIFFERENTIAL/PLATELET
Absolute Monocytes: 1048 cells/uL — ABNORMAL HIGH (ref 200–950)
Basophils Absolute: 66 cells/uL (ref 0–200)
Basophils Relative: 0.5 %
Eosinophils Absolute: 105 cells/uL (ref 15–500)
Eosinophils Relative: 0.8 %
HCT: 42.1 % (ref 38.5–50.0)
Hemoglobin: 14.2 g/dL (ref 13.2–17.1)
Lymphs Abs: 1349 cells/uL (ref 850–3900)
MCH: 30 pg (ref 27.0–33.0)
MCHC: 33.7 g/dL (ref 32.0–36.0)
MCV: 89 fL (ref 80.0–100.0)
MPV: 10.1 fL (ref 7.5–12.5)
Monocytes Relative: 8 %
Neutro Abs: 10532 cells/uL — ABNORMAL HIGH (ref 1500–7800)
Neutrophils Relative %: 80.4 %
Platelets: 246 10*3/uL (ref 140–400)
RBC: 4.73 10*6/uL (ref 4.20–5.80)
RDW: 12.9 % (ref 11.0–15.0)
Total Lymphocyte: 10.3 %
WBC: 13.1 10*3/uL — ABNORMAL HIGH (ref 3.8–10.8)

## 2019-06-07 LAB — TESTOSTERONE: Testosterone: 684 ng/dL (ref 250–827)

## 2019-06-11 ENCOUNTER — Encounter: Payer: Self-pay | Admitting: Family Medicine

## 2019-06-11 ENCOUNTER — Other Ambulatory Visit: Payer: Self-pay | Admitting: Family Medicine

## 2019-06-11 DIAGNOSIS — E785 Hyperlipidemia, unspecified: Secondary | ICD-10-CM

## 2019-06-11 DIAGNOSIS — D72829 Elevated white blood cell count, unspecified: Secondary | ICD-10-CM

## 2019-06-11 DIAGNOSIS — E786 Lipoprotein deficiency: Secondary | ICD-10-CM

## 2019-07-03 ENCOUNTER — Inpatient Hospital Stay: Payer: BLUE CROSS/BLUE SHIELD | Attending: Oncology | Admitting: Oncology

## 2019-07-03 NOTE — Progress Notes (Deleted)
Hematology/Oncology Consult note [facility] [phone] [phone]  Patient Care Team: Delsa Grana, PA-C as PCP - General (Family Medicine) Christene Lye, MD (General Surgery) Arnetha Courser, MD as Attending Physician (Family Medicine) Lucilla Lame, MD as Consulting Physician (Gastroenterology) Corey Skains, MD as Consulting Physician (Cardiology) Vickie Epley, MD as Consulting Physician (General Surgery) Warnell Forester, NP as Nurse Practitioner (Surgery)   Name of the patient: Scott Rosario  [id number]  14-Aug-1961    Reason for referral- leucocytosis   Referring physician- Delsa Grana PA  Date of visit: 07/03/19   History of presenting illness- Patient is a 58 year old male with a past medical history significant for hypertension hyperlipidemia type 2 diabetes and tobacco dependence.  He has been referred to [place] for leukocytosis.  Most recent CBC from 06/06/2019 showed a white count of 13.1, H&H of 14.2/42.1 and a platelet count of 246.  Differential showed neutrophilia with an ANC of 10.5.  Of note patient has had chronic intermittent leukocytosis dating back to 2017 with predominant neutrophilia.  ECOG PS- ***  Pain scale- ***   Review of systems- Review of Systems  Constitutional: Negative for chills, fever, malaise/fatigue and weight loss.  HENT: Negative for congestion, ear discharge and nosebleeds.   Eyes: Negative for blurred vision.  Respiratory: Negative for cough, hemoptysis, sputum production, shortness of breath and wheezing.   Cardiovascular: Negative for chest pain, palpitations, orthopnea and claudication.  Gastrointestinal: Negative for abdominal pain, blood in stool, constipation, diarrhea, heartburn, melena, nausea and vomiting.  Genitourinary: Negative for dysuria, flank pain, frequency, hematuria and urgency.  Musculoskeletal: Negative for back pain, joint pain and myalgias.  Skin:  Negative for rash.  Neurological: Negative for dizziness, tingling, focal weakness, seizures, weakness and headaches.  Endo/Heme/Allergies: Does not bruise/bleed easily.  Psychiatric/Behavioral: Negative for depression and suicidal ideas. The patient does not have insomnia.     Allergies  Allergen Reactions  . Canagliflozin Other (See Comments)    adverse reactions: Fatigue and Zombie feeling   . Semaglutide Diarrhea and Nausea And Vomiting    Ozempic  Acid Reflux  . Tape     SURGICAL TAPE-RIPPED SKIN OFF    Patient Active Problem List   Diagnosis Date Noted  . Polyp of sigmoid colon   . Aortic aneurysm ([place]) 12/10/2018  . Overweight (BMI 25.0-29.9) 12/20/2017  . Acquired trigger finger 02/15/2017  . COPD (chronic obstructive pulmonary disease) ([place])   . Diabetes type 2, uncontrolled ([place])   . GERD (gastroesophageal reflux disease)   . Hyperlipidemia   . Hypertension   . Tobacco use   . CAD (coronary artery disease)   . Aortic atherosclerosis ([place])   . Multiple pulmonary nodules determined by computed tomography of lung 12/31/2014     Past Medical History:  Diagnosis Date  . Acute calculous cholecystitis   . Alkaline phosphatase elevation   . Aortic aneurysm ([place]) 12/10/2018   3.4 cm March 2020  . Aortic atherosclerosis ([place])    on CT 02/25/14  . Asthma   . CAD (coronary artery disease)   . Cirrhosis ([place])   . Cirrhosis ([place])   . COPD (chronic obstructive pulmonary disease) ([place])   . Diabetes mellitus without complication ([place])   . Emphysema of lung ([place])   . Gallstone 12/10/2018  . Gastritis without bleeding   . GERD (gastroesophageal reflux disease)   . Heart attack ([place])    2005, 2017 (stent after each)  . History of hiatal hernia  small  . History of kidney stones    h/o  . Hyperlipidemia   . Hypertension   . Ingrown toenail of both feet 11/29/2018  . Pneumonia 2018  . Pulmonary nodules    x 3, (36mm, 28mm, 56mm) chest CT February 25, 2014  . Renal cyst    on  CT 02/25/14  . Tobacco use      Past Surgical History:  Procedure Laterality Date  . APPENDECTOMY    . CARDIAC CATHETERIZATION Left 03/09/2016   Procedure: Left Heart Cath and Coronary Angiography;  Surgeon: Corey Skains, MD;  Location: [facility];  Service: Cardiovascular;  Laterality: Left;  . CARDIAC CATHETERIZATION N/A 03/09/2016   Procedure: Coronary Stent Intervention;  Surgeon: Isaias Cowman, MD;  Location: [facility];  Service: Cardiovascular;  Laterality: N/A;  . CHOLECYSTECTOMY N/A 01/31/2019   Procedure: LAPAROSCOPIC CHOLECYSTECTOMY - DIABETIC;  Surgeon: Vickie Epley, MD;  Location: [facility];  Service: General;  Laterality: N/A;  . COLONOSCOPY WITH PROPOFOL N/A 03/22/2019   Procedure: COLONOSCOPY WITH PROPOFOL;  Surgeon: Lucilla Lame, MD;  Location: [facility];  Service: Endoscopy;  Laterality: N/A;  Diabetic - insulin and oral meds  . CORONARY ANGIOPLASTY WITH STENT PLACEMENT  10/25/2003   x 2  . ESOPHAGOGASTRODUODENOSCOPY (EGD) WITH PROPOFOL N/A 10/24/2017   Procedure: ESOPHAGOGASTRODUODENOSCOPY (EGD) WITH PROPOFOL;  Surgeon: Lucilla Lame, MD;  Location: [facility];  Service: Endoscopy;  Laterality: N/A;  Diabetic - oral meds  . POLYPECTOMY  03/22/2019   Procedure: POLYPECTOMY;  Surgeon: Lucilla Lame, MD;  Location: [facility];  Service: Endoscopy;;    Social History   Socioeconomic History  . Marital status: Married    Spouse name: Not on file  . Number of children: Not on file  . Years of education: Not on file  . Highest education level: Not on file  Occupational History  . Not on file  Social Needs  . Financial resource strain: Not on file  . Food insecurity    Worry: Not on file    Inability: Not on file  . Transportation needs    Medical: Not on file    Non-medical: Not on file  Tobacco Use  . Smoking status: Current Every Day Smoker    Packs/day: 1.00    Years: 45.00    Pack years: 45.00    Types:  Cigarettes  . Smokeless tobacco: Never Used  . Tobacco comment: since age 67  Substance and Sexual Activity  . Alcohol use: No    Alcohol/week: 0.0 standard drinks  . Drug use: No  . Sexual activity: Yes  Lifestyle  . Physical activity    Days per week: Not on file    Minutes per session: Not on file  . Stress: Not on file  Relationships  . Social Herbalist on phone: Not on file    Gets together: Not on file    Attends religious service: Not on file    Active member of club or organization: Not on file    Attends meetings of clubs or organizations: Not on file    Relationship status: Not on file  . Intimate partner violence    Fear of current or ex partner: Not on file    Emotionally abused: Not on file    Physically abused: Not on file    Forced sexual activity: Not on file  Other Topics Concern  . Not on file  Social History Narrative  Active and independent at baseline     Family History  Problem Relation Age of Onset  . Diabetes Mother   . Hypertension Mother   . Arthritis Mother   . Hyperlipidemia Mother   . Heart attack Father   . Epilepsy Father   . Diabetes Father   . Seizures Father   . Heart disease Father   . Hyperlipidemia Father   . Hypertension Father   . Stroke Father   . Diabetes Brother   . Hyperlipidemia Brother   . Hypertension Brother   . Cancer Maternal Grandmother        unknown  . Diabetes Paternal Grandmother   . Heart disease Paternal Grandmother   . Hyperlipidemia Paternal Grandmother   . Hypertension Paternal Grandmother   . Heart attack Paternal Grandfather      Current Outpatient Medications:  .  albuterol (PROVENTIL HFA;VENTOLIN HFA) 108 (90 Base) MCG/ACT inhaler, Inhale 2 puffs into the lungs every 4 (four) hours as needed for wheezing or shortness of breath. (Patient not taking: Reported on 06/06/2019), Disp: 1 Inhaler, Rfl: 1 .  aspirin EC 81 MG tablet, Take 81 mg by mouth daily., Disp: , Rfl:  .  atorvastatin  (LIPITOR) 80 MG tablet, Take 1 tablet (80 mg total) by mouth at bedtime., Disp: 90 tablet, Rfl: 1 .  busPIRone (BUSPAR) 7.5 MG tablet, Take 1 tablet (7.5 mg total) by mouth 3 (three) times daily., Disp: 90 tablet, Rfl: 1 .  glipiZIDE (GLUCOTROL XL) 10 MG 24 hr tablet, TAKE 1 TABLET BY MOUTH ONCE DAILY, Disp: 90 tablet, Rfl: 1 .  hydrOXYzine (VISTARIL) 25 MG capsule, Take 1-2 capsules (25-50 mg total) by mouth at bedtime., Disp: 60 capsule, Rfl: 1 .  Insulin Glargine (LANTUS SOLOSTAR) 100 UNIT/ML Solostar Pen, Inject 12 Units into the skin at bedtime., Disp: 15 mL, Rfl: 2 .  Insulin Pen Needle 32G X 4 MM MISC, Use as directed with insulin daily, Disp: 100 each, Rfl: 1 .  isosorbide mononitrate (IMDUR) 30 MG 24 hr tablet, Take 1 tablet (30 mg total) by mouth daily., Disp: 30 tablet, Rfl: 1 .  losartan (COZAAR) 50 MG tablet, Take 1 tablet (50 mg total) by mouth every evening., Disp: 90 tablet, Rfl: 1 .  metFORMIN (GLUCOPHAGE) 500 MG tablet, Take 2 tablets (1,000 mg total) by mouth 2 (two) times daily with a meal., Disp: 180 tablet, Rfl: 2 .  metoprolol succinate (TOPROL-XL) 25 MG 24 hr tablet, Take 1 tablet (25 mg total) by mouth every morning., Disp: 90 tablet, Rfl: 1 .  nitroGLYCERIN (NITROSTAT) 0.4 MG SL tablet, Place 1 tablet (0.4 mg total) under the tongue every 5 (five) minutes as needed for chest pain. Max of 3 total; call 911, Disp: 25 tablet, Rfl: 5 .  NOVOLOG FLEXPEN 100 UNIT/ML FlexPen, as needed. , Disp: , Rfl:  .  pantoprazole (PROTONIX) 40 MG tablet, Take 1 tablet (40 mg total) by mouth 2 (two) times daily., Disp: 180 tablet, Rfl: 1 .  ursodiol (ACTIGALL) 300 MG capsule, Take 2 capsules (600 mg total) by mouth 3 (three) times daily. (Patient taking differently: Take 600 mg by mouth 2 (two) times daily. ), Disp: 180 capsule, Rfl: 3   Physical exam: There were no vitals filed for this visit. Physical Exam HENT:     Head: Normocephalic and atraumatic.  Eyes:     Pupils: Pupils are equal,  round, and reactive to light.  Neck:     Musculoskeletal: Normal range of motion.  Cardiovascular:     Rate and Rhythm: Normal rate and regular rhythm.     Heart sounds: Normal heart sounds.  Pulmonary:     Effort: Pulmonary effort is normal.     Breath sounds: Normal breath sounds.  Abdominal:     General: Bowel sounds are normal.     Palpations: Abdomen is soft.  Skin:    General: Skin is warm and dry.  Neurological:     Mental Status: He is alert and oriented to person, place, and time.        CMP Latest Ref Rng & Units 06/06/2019  Glucose 65 - 99 mg/dL 158(H)  BUN 7 - 25 mg/dL 7  Creatinine 0.70 - 1.33 mg/dL 0.75  Sodium 135 - 146 mmol/L 138  Potassium 3.5 - 5.3 mmol/L 4.3  Chloride 98 - 110 mmol/L 103  CO2 20 - 32 mmol/L 28  Calcium 8.6 - 10.3 mg/dL 9.3  Total Protein 6.1 - 8.1 g/dL 6.0(L)  Total Bilirubin 0.2 - 1.2 mg/dL 1.1  Alkaline Phos 39 - 117 IU/L -  AST 10 - 35 U/L 11  ALT 9 - 46 U/L 15   CBC Latest Ref Rng & Units 06/06/2019  WBC 3.8 - 10.8 Thousand/uL 13.1(H)  Hemoglobin 13.2 - 17.1 g/dL 14.2  Hematocrit 38.5 - 50.0 % 42.1  Platelets 140 - 400 Thousand/uL 246     Assessment and plan- Patient is a 58 y.o. male referred for leukocytosis mainly neutrophilia  Leukocytosis/neutrophilia has been chronic and intermittent and his white count has been fluctuating from normal levels to 17.  Differential mainly shows neutrophilia and I suspect this is secondary to underlying comorbidities/COPD and ongoing smoking.  There is no consistent increase in his white count.  Leukocytosis is mild restricted to neutrophilia and no evidence of lymphocytosis or basophilia suggestive of CLL or myeloproliferative neoplasm.  Hemoglobin and platelet counts are normal.  Today I will check a repeat CBC with differential and smear review.   Thank you for this kind referral and the opportunity to participate in the care of this patient   Visit Diagnosis 1. Neutrophilia     Dr.  Randa Evens, MD, MPH [facility] [id number] 07/03/2019

## 2019-07-04 ENCOUNTER — Other Ambulatory Visit: Payer: Self-pay

## 2019-07-04 ENCOUNTER — Ambulatory Visit (INDEPENDENT_AMBULATORY_CARE_PROVIDER_SITE_OTHER): Payer: BLUE CROSS/BLUE SHIELD | Admitting: Family Medicine

## 2019-07-04 ENCOUNTER — Encounter: Payer: Self-pay | Admitting: Family Medicine

## 2019-07-04 VITALS — BP 142/78 | HR 69 | Temp 97.6°F | Resp 14 | Ht 70.0 in | Wt 175.4 lb

## 2019-07-04 DIAGNOSIS — F411 Generalized anxiety disorder: Secondary | ICD-10-CM | POA: Diagnosis not present

## 2019-07-04 DIAGNOSIS — N529 Male erectile dysfunction, unspecified: Secondary | ICD-10-CM

## 2019-07-04 DIAGNOSIS — E1165 Type 2 diabetes mellitus with hyperglycemia: Secondary | ICD-10-CM | POA: Diagnosis not present

## 2019-07-04 DIAGNOSIS — F430 Acute stress reaction: Secondary | ICD-10-CM

## 2019-07-04 MED ORDER — GLIPIZIDE ER 5 MG PO TB24: 5.0000 mg | ORAL_TABLET | Freq: Every day | ORAL | 0 refills | Status: DC

## 2019-07-04 NOTE — Patient Instructions (Signed)
Follow up with endocrinology  We reduced your glipizide medicine so you don't have low blood sugars during the day,   Hold if low or if ill or not eating  Follow up with Urology for erectile dysfunction  I will call you if I get other ideas for treatment after I have consulted with the other providers here at [facility]  Thanks!

## 2019-07-04 NOTE — Progress Notes (Signed)
Patient ID: NASHID MENZE, male    DOB: October 06, 1960, 58 y.o.   MRN: [id number]  PCP: Delsa Grana, PA-C  Chief Complaint  Patient presents with  . Follow-up  . Hypertension  . Diabetes  . Hyperlipidemia    Subjective:   Scott Rosario is a 58 y.o. male, presents to clinic with CC of the following:  HPI  DM: Poorly controlled sugars - pt new to me last month, was having high sugars daily - reported > 200, was not seeing endocrinology, but A1C was 6.5.   Had a low sugar episode since last month sugar was 69 and that was in the afternoon Doing glipizide 10 mg, metformin 1000 mg bid, and lantus at bedtime 10 units, he tried to increase to 12 units for his high sugars, but too many lows in afternoon so he went back to 10 units at bedtime. Since last visit blood sugars at home have improved, in the morning fasting CBGs 130-140  Denies: Polyuria, polydipsia, polyphagia, vision changes, or neuropathy Recent pertinent labs: Lab Results  Component Value Date   HGBA1C 6.5 (H) 06/06/2019   HGBA1C 8.6 (H) 11/29/2018   HGBA1C 8.3 (H) 07/25/2018   ED - A month ago he briefly addressed having erectile dysfunction with decreased libido also having trouble obtaining an erection and he wanted to start medications for that.  He is having marital issues and increased anxiety with recent separation also recent health issues with hospitalizations, working up unstable angina with his cardiologist. His heart health is better, his mood and anxiety has improved.  He states that he does have a good libido but still having some ED.  He was able to get a morning testosterone lab done which was normal in the 600s.  He is on Imdur and does have sublingual nitroglycerin which would interact with Viagra and other medications, we discussed specialist evaluation today  Anxiety -  Started buspar last month, mood better, anxiety decreased, no concerns or SE from meds   Office Visit from 07/04/2019 in [facility]  PHQ-9 Total Score  0        Patient Active Problem List   Diagnosis Date Noted  . Polyp of sigmoid colon   . Aortic aneurysm ([place]) 12/10/2018  . Overweight (BMI 25.0-29.9) 12/20/2017  . Acquired trigger finger 02/15/2017  . COPD (chronic obstructive pulmonary disease) ([place])   . Diabetes type 2, uncontrolled ([place])   . GERD (gastroesophageal reflux disease)   . Hyperlipidemia   . Hypertension   . Tobacco use   . CAD (coronary artery disease)   . Aortic atherosclerosis ([place])   . Multiple pulmonary nodules determined by computed tomography of lung 12/31/2014      Current Outpatient Medications:  .  aspirin EC 81 MG tablet, Take 81 mg by mouth daily., Disp: , Rfl:  .  atorvastatin (LIPITOR) 80 MG tablet, Take 1 tablet (80 mg total) by mouth at bedtime., Disp: 90 tablet, Rfl: 1 .  busPIRone (BUSPAR) 7.5 MG tablet, Take 1 tablet (7.5 mg total) by mouth 3 (three) times daily., Disp: 90 tablet, Rfl: 1 .  glipiZIDE (GLUCOTROL XL) 10 MG 24 hr tablet, TAKE 1 TABLET BY MOUTH ONCE DAILY, Disp: 90 tablet, Rfl: 1 .  hydrOXYzine (VISTARIL) 25 MG capsule, Take 1-2 capsules (25-50 mg total) by mouth at bedtime., Disp: 60 capsule, Rfl: 1 .  Insulin Glargine (LANTUS SOLOSTAR) 100 UNIT/ML Solostar Pen, Inject 12 Units into the skin at bedtime.,  Disp: 15 mL, Rfl: 2 .  Insulin Pen Needle 32G X 4 MM MISC, Use as directed with insulin daily, Disp: 100 each, Rfl: 1 .  isosorbide mononitrate (IMDUR) 30 MG 24 hr tablet, Take 1 tablet (30 mg total) by mouth daily., Disp: 30 tablet, Rfl: 1 .  losartan (COZAAR) 50 MG tablet, Take 1 tablet (50 mg total) by mouth every evening., Disp: 90 tablet, Rfl: 1 .  metFORMIN (GLUCOPHAGE) 500 MG tablet, Take 2 tablets (1,000 mg total) by mouth 2 (two) times daily with a meal., Disp: 180 tablet, Rfl: 2 .  metoprolol succinate (TOPROL-XL) 25 MG 24 hr tablet, Take 1 tablet (25 mg total) by mouth every morning., Disp: 90 tablet, Rfl: 1 .   nitroGLYCERIN (NITROSTAT) 0.4 MG SL tablet, Place 1 tablet (0.4 mg total) under the tongue every 5 (five) minutes as needed for chest pain. Max of 3 total; call 911, Disp: 25 tablet, Rfl: 5 .  pantoprazole (PROTONIX) 40 MG tablet, Take 1 tablet (40 mg total) by mouth 2 (two) times daily., Disp: 180 tablet, Rfl: 1 .  ursodiol (ACTIGALL) 300 MG capsule, Take 2 capsules (600 mg total) by mouth 3 (three) times daily. (Patient taking differently: Take 600 mg by mouth 2 (two) times daily. ), Disp: 180 capsule, Rfl: 3 .  albuterol (PROVENTIL HFA;VENTOLIN HFA) 108 (90 Base) MCG/ACT inhaler, Inhale 2 puffs into the lungs every 4 (four) hours as needed for wheezing or shortness of breath. (Patient not taking: Reported on 06/06/2019), Disp: 1 Inhaler, Rfl: 1 .  NOVOLOG FLEXPEN 100 UNIT/ML FlexPen, as needed. , Disp: , Rfl:    Allergies  Allergen Reactions  . Canagliflozin Other (See Comments)    adverse reactions: Fatigue and Zombie feeling   . Semaglutide Diarrhea and Nausea And Vomiting    Ozempic  Acid Reflux  . Tape     SURGICAL TAPE-RIPPED SKIN OFF     Family History  Problem Relation Age of Onset  . Diabetes Mother   . Hypertension Mother   . Arthritis Mother   . Hyperlipidemia Mother   . Heart attack Father   . Epilepsy Father   . Diabetes Father   . Seizures Father   . Heart disease Father   . Hyperlipidemia Father   . Hypertension Father   . Stroke Father   . Diabetes Brother   . Hyperlipidemia Brother   . Hypertension Brother   . Cancer Maternal Grandmother        unknown  . Diabetes Paternal Grandmother   . Heart disease Paternal Grandmother   . Hyperlipidemia Paternal Grandmother   . Hypertension Paternal Grandmother   . Heart attack Paternal Grandfather      Social History   Socioeconomic History  . Marital status: Married    Spouse name: Not on file  . Number of children: Not on file  . Years of education: Not on file  . Highest education level: Not on file   Occupational History  . Not on file  Social Needs  . Financial resource strain: Not on file  . Food insecurity    Worry: Not on file    Inability: Not on file  . Transportation needs    Medical: Not on file    Non-medical: Not on file  Tobacco Use  . Smoking status: Current Every Day Smoker    Packs/day: 1.00    Years: 45.00    Pack years: 45.00    Types: Cigarettes  . Smokeless tobacco: Never Used  .  Tobacco comment: since age 40  Substance and Sexual Activity  . Alcohol use: No    Alcohol/week: 0.0 standard drinks  . Drug use: No  . Sexual activity: Yes  Lifestyle  . Physical activity    Days per week: Not on file    Minutes per session: Not on file  . Stress: Not on file  Relationships  . Social Herbalist on phone: Not on file    Gets together: Not on file    Attends religious service: Not on file    Active member of club or organization: Not on file    Attends meetings of clubs or organizations: Not on file    Relationship status: Not on file  . Intimate partner violence    Fear of current or ex partner: Not on file    Emotionally abused: Not on file    Physically abused: Not on file    Forced sexual activity: Not on file  Other Topics Concern  . Not on file  Social History Narrative   Active and independent at baseline    I personally reviewed active problem list, medication list, allergies, notes from last encounter, lab results with the patient/caregiver today.  Review of Systems  Constitutional: Negative.   HENT: Negative.   Eyes: Negative.   Respiratory: Negative.   Cardiovascular: Negative.   Gastrointestinal: Negative.   Endocrine: Negative.   Genitourinary: Negative.   Musculoskeletal: Negative.   Skin: Negative.   Allergic/Immunologic: Negative.   Neurological: Negative.   Hematological: Negative.   Psychiatric/Behavioral: Negative.   All other systems reviewed and are negative.      Objective:   Vitals:   07/04/19 0810   BP: (!) 142/78  Pulse: 69  Resp: 14  Temp: 97.6 F (36.4 C)  SpO2: 96%  Weight: 175 lb 6.4 oz (79.6 kg)  Height: 5\' 10"  (1.778 m)    Body mass index is 25.17 kg/m.  Physical Exam Vitals signs and nursing note reviewed.  Constitutional:      General: He is not in acute distress.    Appearance: Normal appearance. He is well-developed. He is not ill-appearing, toxic-appearing or diaphoretic.     Interventions: Face mask in place.  HENT:     Head: Normocephalic and atraumatic.     Jaw: No trismus.     Right Ear: External ear normal.     Left Ear: External ear normal.  Eyes:     General: Lids are normal. No scleral icterus.    Conjunctiva/sclera: Conjunctivae normal.     Pupils: Pupils are equal, round, and reactive to light.  Neck:     Musculoskeletal: Normal range of motion and neck supple.     Trachea: Trachea and phonation normal. No tracheal deviation.  Cardiovascular:     Rate and Rhythm: Normal rate and regular rhythm.     Pulses: No decreased pulses.          Radial pulses are 2+ on the right side and 2+ on the left side.       Femoral pulses are 1+ on the right side and 1+ on the left side.      Posterior tibial pulses are 2+ on the right side and 2+ on the left side.     Heart sounds: Normal heart sounds. No murmur. No friction rub. No gallop.   Pulmonary:     Effort: Pulmonary effort is normal. No respiratory distress.     Breath sounds: Normal breath sounds.  No stridor. No wheezing, rhonchi or rales.  Abdominal:     General: Bowel sounds are normal. There is no distension or abdominal bruit.     Palpations: Abdomen is soft.     Tenderness: There is no abdominal tenderness. There is no guarding or rebound.  Musculoskeletal: Normal range of motion.     Right lower leg: No edema.     Left lower leg: No edema.  Skin:    General: Skin is warm and dry.     Capillary Refill: Capillary refill takes less than 2 seconds.     Coloration: Skin is not jaundiced.      Findings: No rash.     Nails: There is no clubbing.   Neurological:     Mental Status: He is alert.     Cranial Nerves: No dysarthria or facial asymmetry.     Motor: No tremor or abnormal muscle tone.     Gait: Gait normal.  Psychiatric:        Mood and Affect: Mood normal.        Speech: Speech normal.        Behavior: Behavior normal. Behavior is cooperative.      Results for orders placed or performed in visit on 06/06/19  Hemoglobin A1c  Result Value Ref Range   Hgb A1c MFr Bld 6.5 (H) <5.7 % of total Hgb   Mean Plasma Glucose 140 (calc)   eAG (mmol/L) 7.7 (calc)  Lipid panel  Result Value Ref Range   Cholesterol 86 <200 mg/dL   HDL 29 (L) > OR = 40 mg/dL   Triglycerides 96 <150 mg/dL   LDL Cholesterol (Calc) 39 mg/dL (calc)   Total CHOL/HDL Ratio 3.0 <5.0 (calc)   Non-HDL Cholesterol (Calc) 57 <130 mg/dL (calc)  COMPLETE METABOLIC PANEL WITH GFR  Result Value Ref Range   Glucose, Bld 158 (H) 65 - 99 mg/dL   BUN 7 7 - 25 mg/dL   Creat 0.75 0.70 - 1.33 mg/dL   GFR, Est Non African American 100 > OR = 60 mL/min/1.61m2   GFR, Est African American 116 > OR = 60 mL/min/1.98m2   BUN/Creatinine Ratio NOT APPLICABLE 6 - 22 (calc)   Sodium 138 135 - 146 mmol/L   Potassium 4.3 3.5 - 5.3 mmol/L   Chloride 103 98 - 110 mmol/L   CO2 28 20 - 32 mmol/L   Calcium 9.3 8.6 - 10.3 mg/dL   Total Protein 6.0 (L) 6.1 - 8.1 g/dL   Albumin 4.1 3.6 - 5.1 g/dL   Globulin 1.9 1.9 - 3.7 g/dL (calc)   AG Ratio 2.2 1.0 - 2.5 (calc)   Total Bilirubin 1.1 0.2 - 1.2 mg/dL   Alkaline phosphatase (APISO) 131 35 - 144 U/L   AST 11 10 - 35 U/L   ALT 15 9 - 46 U/L  CBC with Differential/Platelet  Result Value Ref Range   WBC 13.1 (H) 3.8 - 10.8 Thousand/uL   RBC 4.73 4.20 - 5.80 Million/uL   Hemoglobin 14.2 13.2 - 17.1 g/dL   HCT 42.1 38.5 - 50.0 %   MCV 89.0 80.0 - 100.0 fL   MCH 30.0 27.0 - 33.0 pg   MCHC 33.7 32.0 - 36.0 g/dL   RDW 12.9 11.0 - 15.0 %   Platelets 246 140 - 400 Thousand/uL    MPV 10.1 7.5 - 12.5 fL   Neutro Abs 10,532 (H) 1,500 - 7,800 cells/uL   Lymphs Abs 1,349 850 - 3,900 cells/uL  Absolute Monocytes 1,048 (H) 200 - 950 cells/uL   Eosinophils Absolute 105 15 - 500 cells/uL   Basophils Absolute 66 0 - 200 cells/uL   Neutrophils Relative % 80.4 %   Total Lymphocyte 10.3 %   Monocytes Relative 8.0 %   Eosinophils Relative 0.8 %   Basophils Relative 0.5 %  Testosterone  Result Value Ref Range   Testosterone 684 250 - 827 ng/dL        Assessment & Plan:      ICD-10-CM   1. Erectile dysfunction, unspecified erectile dysfunction type  N52.9    testosterone normal, cannot use viagra due to interaction with imdur, refer to urology  2. Uncontrolled type 2 diabetes mellitus with hyperglycemia (HCC)  E11.65 glipiZIDE (GLUCOTROL XL) 5 MG 24 hr tablet   now well controlled, decrease glipizide xl from 10 to 5 mg daily, hold if low blood sugar or not eating, f/up with endocrine  3. Anxiety as acute reaction to exceptional stress  F41.1    F43.0    improving on meds, can decrease frequency for what is needed        Delsa Grana, PA-C 07/04/19 8:13 AM

## 2019-07-19 ENCOUNTER — Telehealth: Payer: Self-pay | Admitting: Gastroenterology

## 2019-07-19 ENCOUNTER — Other Ambulatory Visit: Payer: Self-pay

## 2019-07-19 LAB — HM DIABETES EYE EXAM

## 2019-07-19 NOTE — Telephone Encounter (Signed)
Pt wife left vm for Ginger pt needs refill on rx [id number] mg pharmacy fax is [phone]  They also should have faxed a request

## 2019-07-20 ENCOUNTER — Other Ambulatory Visit: Payer: Self-pay

## 2019-07-20 MED ORDER — URSODIOL 300 MG PO CAPS: 600.0000 mg | ORAL_CAPSULE | Freq: Two times a day (BID) | ORAL | 3 refills | Status: DC

## 2019-07-20 NOTE — Telephone Encounter (Signed)
Spoke with pt's wife and confirmed dosage. Rx faxed.

## 2019-08-08 ENCOUNTER — Telehealth: Payer: Self-pay

## 2019-08-08 NOTE — Telephone Encounter (Signed)
Left message for patient to call back and let us know scheduling preferences for 6 month follow up lung cancer screening CT scan. [phone])

## 2019-08-22 ENCOUNTER — Telehealth: Payer: Self-pay

## 2019-08-22 NOTE — Telephone Encounter (Signed)
Left voicemail for pt to instruct him to call back and confirm scheduling preferences prior to follow up CT scan being scheduled for lung cancer screening.

## 2019-08-31 ENCOUNTER — Ambulatory Visit: Payer: BLUE CROSS/BLUE SHIELD | Admitting: Internal Medicine

## 2019-09-08 IMAGING — CR DG CHEST 2V
2 series · 2 of 2 positions shown · non-contrast
Comparison: 02/24/2017

CLINICAL DATA: Weakness and fever for several days

EXAM:
CHEST  2 VIEW

[chest lat]
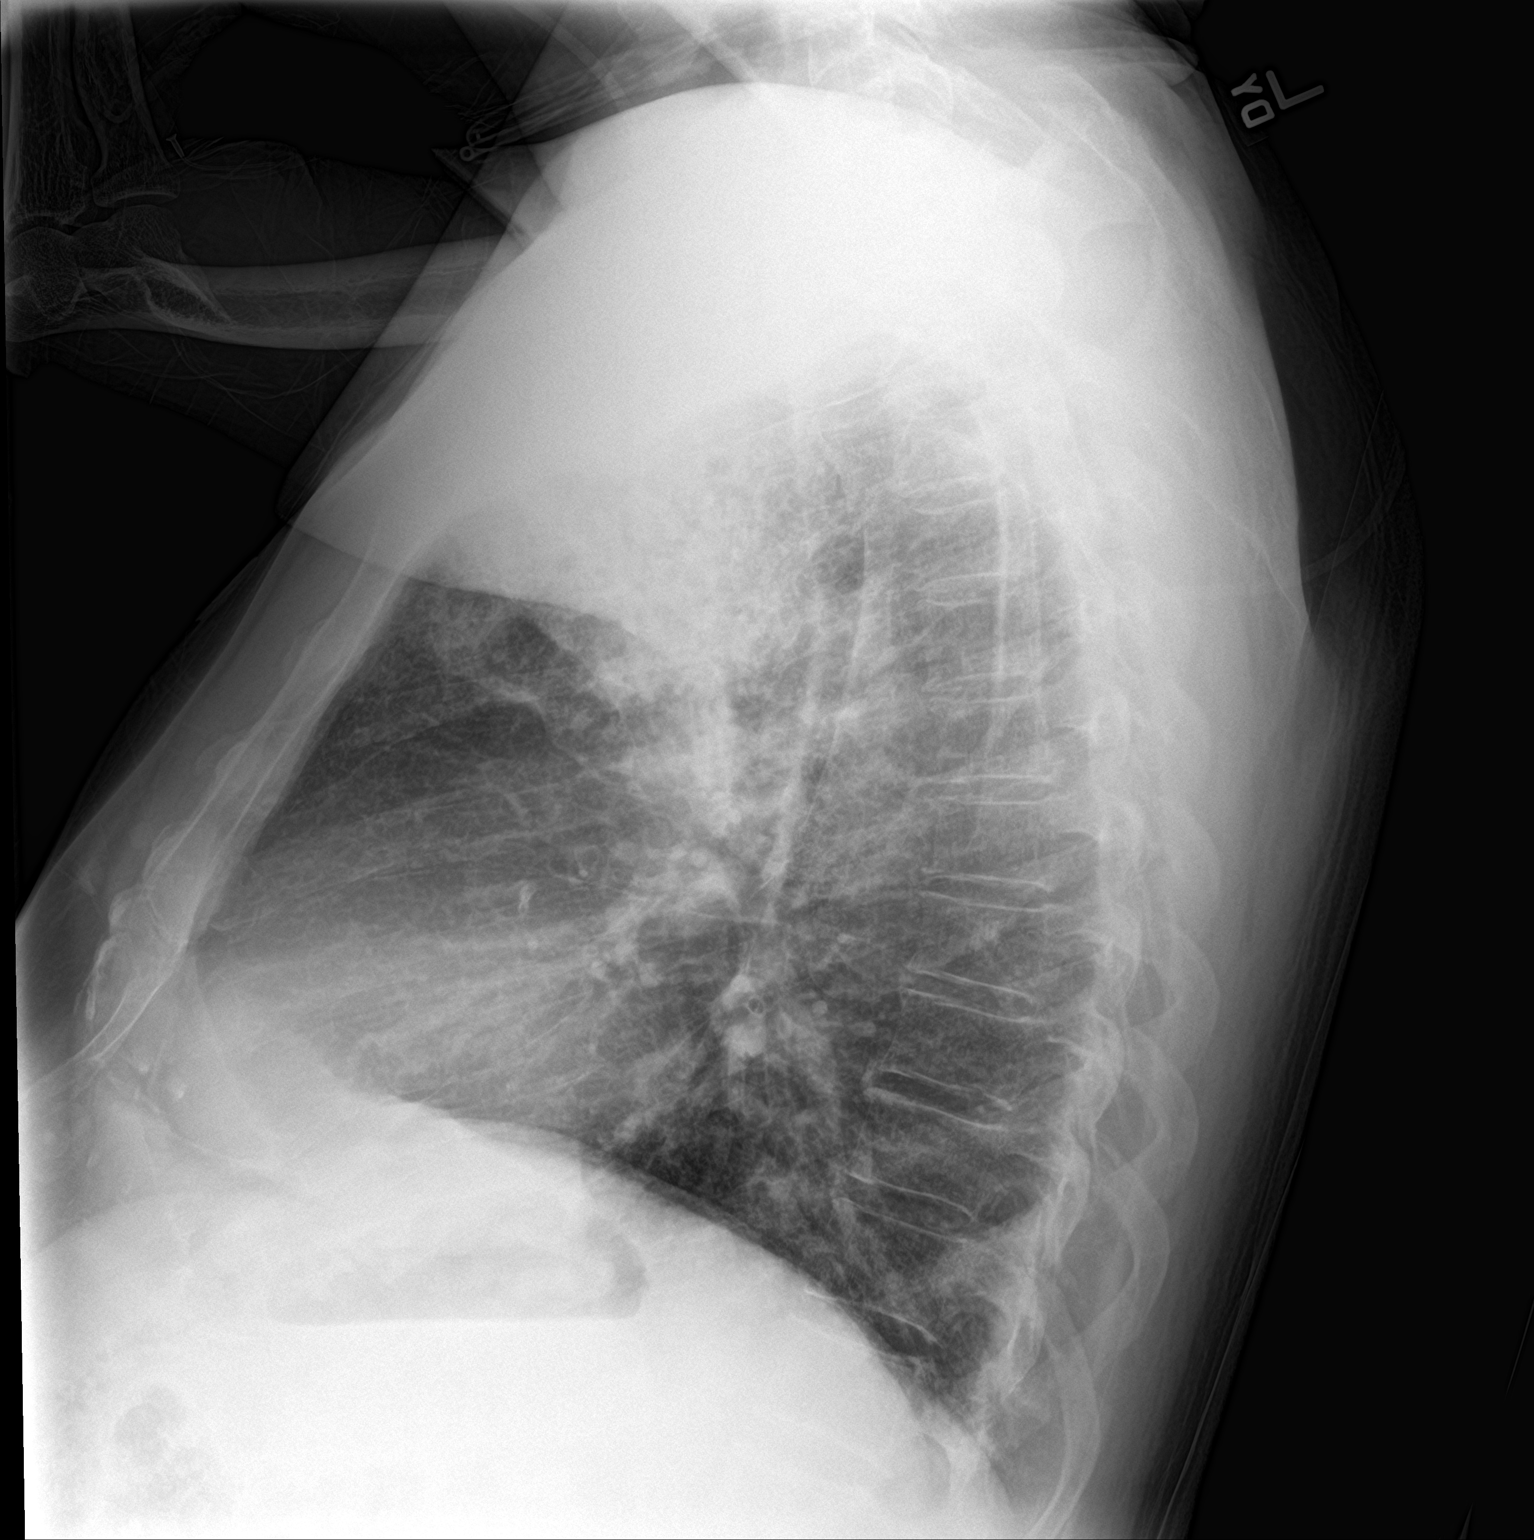

[chest ap]
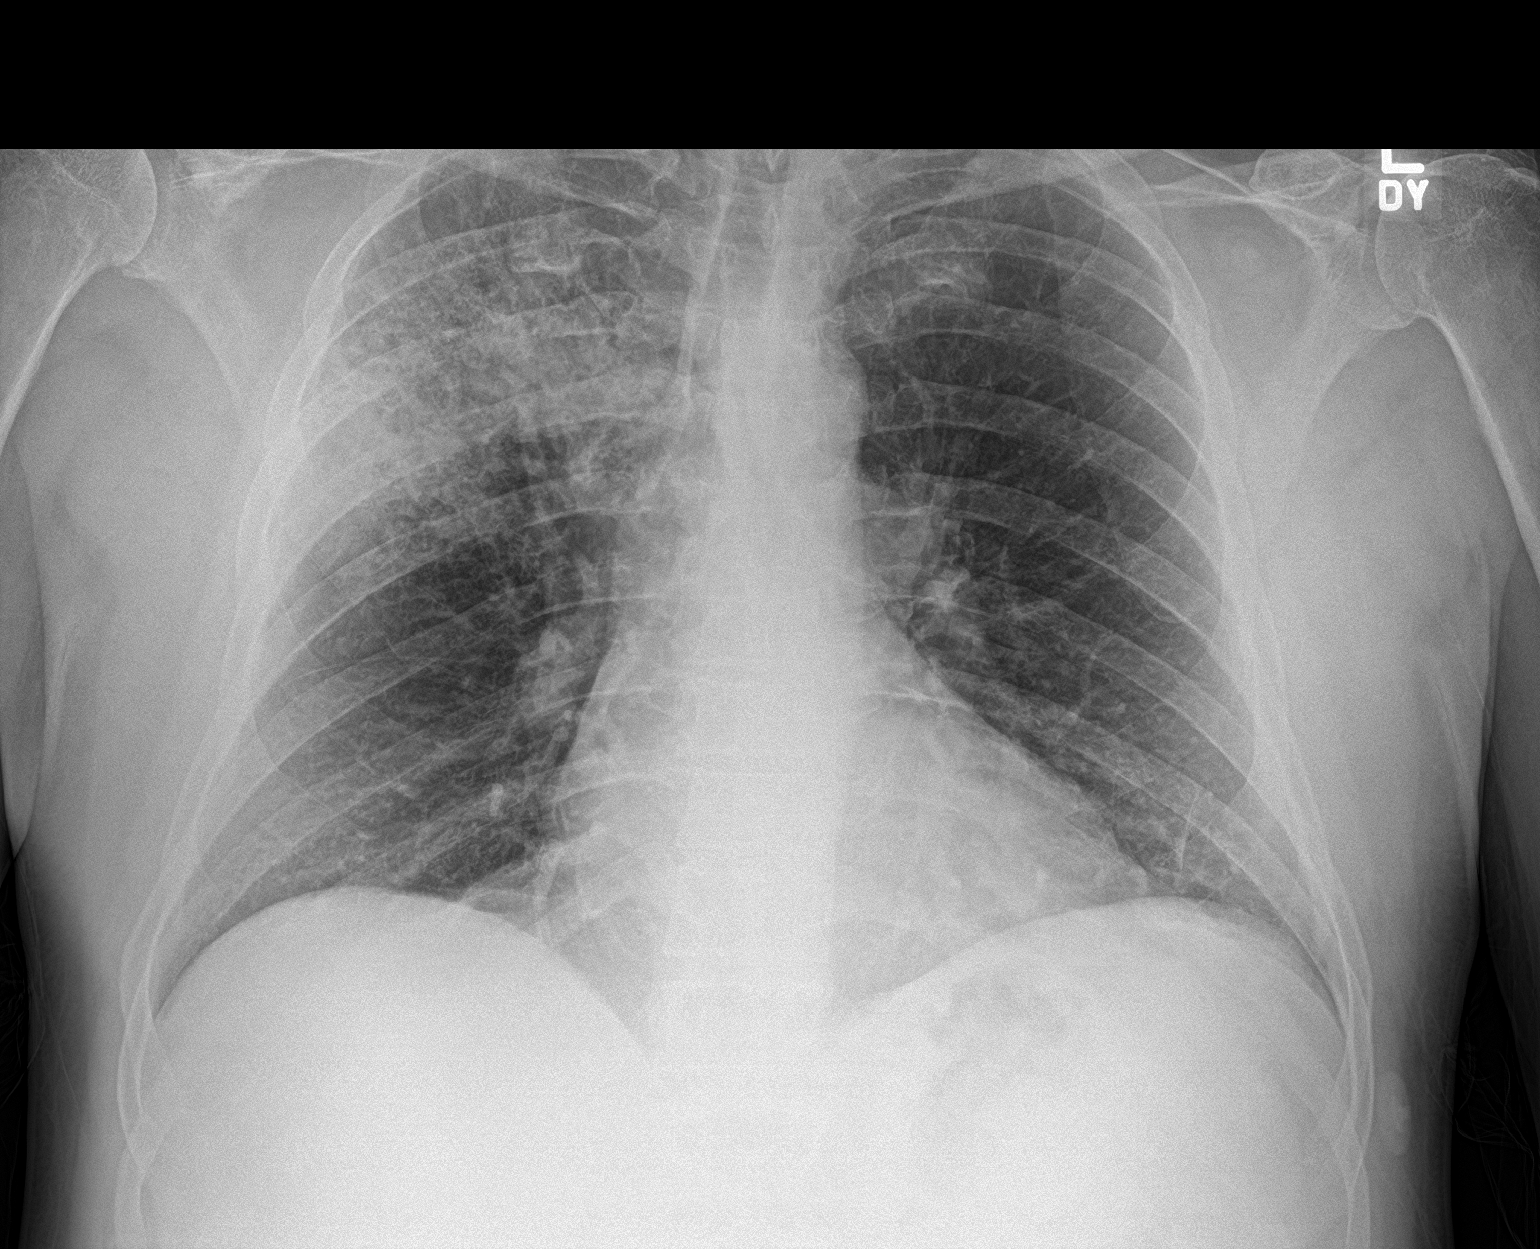

[2 of 2 positions shown; findings below may reference images not displayed]

FINDINGS: Cardiac shadow is within normal limits. Left lung is clear. The
right lung demonstrates diffuse right upper lobe infiltrate
consistent with pneumonia. No sizable effusion is seen. No bony
abnormality is noted.
IMPRESSION: Right upper lobe pneumonia. Followup PA and lateral chest X-ray is
recommended in 3-4 weeks following trial of antibiotic therapy to
ensure resolution and exclude underlying malignancy.

## 2019-09-09 ENCOUNTER — Other Ambulatory Visit: Payer: Self-pay

## 2019-09-09 ENCOUNTER — Ambulatory Visit
Admission: EM | Admit: 2019-09-09 | Discharge: 2019-09-09 | Disposition: A | Discharge status: Home / Self Care | Payer: BLUE CROSS/BLUE SHIELD | Attending: Family Medicine | Admitting: Family Medicine

## 2019-09-09 DIAGNOSIS — Z20822 Contact with and (suspected) exposure to covid-19: Secondary | ICD-10-CM

## 2019-09-09 DIAGNOSIS — J069 Acute upper respiratory infection, unspecified: Secondary | ICD-10-CM | POA: Diagnosis not present

## 2019-09-09 DIAGNOSIS — Z20828 Contact with and (suspected) exposure to other viral communicable diseases: Secondary | ICD-10-CM | POA: Diagnosis not present

## 2019-09-09 NOTE — Discharge Instructions (Addendum)
It was very nice seeing you today in clinic. Thank you for entrusting me with your care.   Rest and Stay HYDRATED. Water and electrolyte containing beverages (Gatorade, Pedialyte) are best to prevent dehydration and electrolyte abnormalities. May use Tylenol and/or Ibuprofen as needed for pain/fever.   You were tested for SARS-CoV-2 (novel coronavirus) today. Testing is performed by an outside lab ([facility]) and has variable turn around times ranging between 2-5 days. Current recommendations from the the CDC and [place] [employer] require that you remain out of work in order to quarantine at home until negative test results are have been received. In the event that your test results are positive, you will be contacted with further directives. These measures are being implemented out of an abundance of caution to prevent transmission and spread during the current SARS-CoV-2 pandemic.  Make arrangements to follow up with your regular doctor in 1 week for re-evaluation if not improving. If your symptoms/condition worsens, please seek follow up care either here or in the ER. Please remember, our [place] providers are "right here with you" when you need [place].   Again, it was my pleasure to take care of you today. Thank you for choosing our clinic. I hope that you start to feel better quickly.   Honor Loh, MSN, APRN, FNP-C, CEN Advanced Practice Provider [facility]

## 2019-09-09 NOTE — ED Triage Notes (Addendum)
Pt. States he has felt bad sicne Friday with diarrhea,nausea,vomiting, body aches, cough, nasal congestion. She states his last contact with his son was Thursday(12/10) & his son tested POSITIVE on Tuesday(12/8). Wants COVID testing.

## 2019-09-10 LAB — NOVEL CORONAVIRUS, NAA (HOSP ORDER, SEND-OUT TO REF LAB; TAT 18-24 HRS): SARS-CoV-2, NAA: NOT DETECTED

## 2019-09-10 NOTE — ED Provider Notes (Signed)
[place], [place]   Name: Scott Rosario DOB: [birth year] MRN: [id number] CSN: [id number] PCP: Patient, No Pcp Per  Arrival date and time:  09/09/19 1427  Chief Complaint:  Generalized Body Aches, Diarrhea, Cough, Nasal Congestion, and COVID exposure   NOTE: Prior to seeing the patient today, I have reviewed the triage nursing documentation and vital signs. Clinical staff has updated patient's PMH/PSHx, current medication list, and drug allergies/intolerances to ensure comprehensive history available to assist in medical decision making.   History:   HPI: Scott Rosario is a 58 y.o. male who presents today with complaints of congestion, worsening of his chronic cough, fatigue, and diffuse myalgias that started approximately 2 days ago. Patient denies fevers, sore throat, facial pain, or otalgia. Patient has a chronic cough related to his known COPD; intermittently productive. He denies any shortness of breath or wheezing. He has experienced nausea and a few episodes of diarrhea. She denies vomiting. He is eating and drinking well. Patient denies any perceived alterations to his sense of taste or smell. Patient presents out of concern for her personal health after close exposure to his son who tested positive for SARS-CoV-2 (novel coronavirus) on Tuesday (09/04/2019). Patient advising that he had direct contact with his son on both 12/09 and 12/10.  He has never been tested for SARS-CoV-2 (novel coronavirus) in the past per his report. Patient has not been vaccinated for influenza this season. Despite his symptoms, patient has not taken any over the counter interventions to help improve/relieve his reported symptoms at home.   Past Medical History:  Diagnosis Date  . Acute calculous cholecystitis   . Alkaline phosphatase elevation   . Aortic aneurysm ([place]) 12/10/2018   3.4 cm March 2020  . Aortic atherosclerosis ([place])    on CT 02/25/14  . Asthma   . CAD (coronary artery disease)   . Cirrhosis  ([place])   . Cirrhosis ([place])   . COPD (chronic obstructive pulmonary disease) ([place])   . Diabetes mellitus without complication ([place])   . Emphysema of lung ([place])   . Gallstone 12/10/2018  . Gastritis without bleeding   . GERD (gastroesophageal reflux disease)   . Heart attack ([place])    2005, 2017 (stent after each)  . History of hiatal hernia    small  . History of kidney stones    h/o  . Hyperlipidemia   . Hypertension   . Ingrown toenail of both feet 11/29/2018  . Pneumonia 2018  . Pulmonary nodules    x 3, (80mm, 54mm, 71mm) chest CT February 25, 2014  . Renal cyst    on CT 02/25/14  . Tobacco use     Past Surgical History:  Procedure Laterality Date  . APPENDECTOMY    . CARDIAC CATHETERIZATION Left 03/09/2016   Procedure: Left Heart Cath and Coronary Angiography;  Surgeon: Corey Skains, MD;  Location: [facility];  Service: Cardiovascular;  Laterality: Left;  . CARDIAC CATHETERIZATION N/A 03/09/2016   Procedure: Coronary Stent Intervention;  Surgeon: Isaias Cowman, MD;  Location: [facility];  Service: Cardiovascular;  Laterality: N/A;  . CHOLECYSTECTOMY N/A 01/31/2019   Procedure: LAPAROSCOPIC CHOLECYSTECTOMY - DIABETIC;  Surgeon: Vickie Epley, MD;  Location: [facility];  Service: General;  Laterality: N/A;  . COLONOSCOPY WITH PROPOFOL N/A 03/22/2019   Procedure: COLONOSCOPY WITH PROPOFOL;  Surgeon: Lucilla Lame, MD;  Location: [facility];  Service: Endoscopy;  Laterality: N/A;  Diabetic - insulin and oral meds  . CORONARY  ANGIOPLASTY WITH STENT PLACEMENT  10/25/2003   x 2  . ESOPHAGOGASTRODUODENOSCOPY (EGD) WITH PROPOFOL N/A 10/24/2017   Procedure: ESOPHAGOGASTRODUODENOSCOPY (EGD) WITH PROPOFOL;  Surgeon: Lucilla Lame, MD;  Location: [facility];  Service: Endoscopy;  Laterality: N/A;  Diabetic - oral meds  . POLYPECTOMY  03/22/2019   Procedure: POLYPECTOMY;  Surgeon: Lucilla Lame, MD;  Location: [facility];  Service: Endoscopy;;     Family History  Problem Relation Age of Onset  . Diabetes Mother   . Hypertension Mother   . Arthritis Mother   . Hyperlipidemia Mother   . Heart attack Father   . Epilepsy Father   . Diabetes Father   . Seizures Father   . Heart disease Father   . Hyperlipidemia Father   . Hypertension Father   . Stroke Father   . Diabetes Brother   . Hyperlipidemia Brother   . Hypertension Brother   . Cancer Maternal Grandmother        unknown  . Diabetes Paternal Grandmother   . Heart disease Paternal Grandmother   . Hyperlipidemia Paternal Grandmother   . Hypertension Paternal Grandmother   . Heart attack Paternal Grandfather     Social History   Tobacco Use  . Smoking status: Current Every Day Smoker    Packs/day: 1.00    Years: 45.00    Pack years: 45.00    Types: Cigarettes  . Smokeless tobacco: Never Used  . Tobacco comment: since age 57  Substance Use Topics  . Alcohol use: No    Alcohol/week: 0.0 standard drinks  . Drug use: No    Patient Active Problem List   Diagnosis Date Noted  . Polyp of sigmoid colon   . Aortic aneurysm ([place]) 12/10/2018  . Overweight (BMI 25.0-29.9) 12/20/2017  . Acquired trigger finger 02/15/2017  . COPD (chronic obstructive pulmonary disease) ([place])   . Diabetes type 2, uncontrolled ([place])   . GERD (gastroesophageal reflux disease)   . Hyperlipidemia   . Hypertension   . Tobacco use   . CAD (coronary artery disease)   . Aortic atherosclerosis ([place])   . Multiple pulmonary nodules determined by computed tomography of lung 12/31/2014    Home Medications:    No outpatient medications have been marked as taking for the 09/09/19 encounter [facility] Encounter).    Allergies:   Canagliflozin, Semaglutide, and Tape  Review of Systems (ROS): Review of Systems  Constitutional: Positive for fatigue. Negative for fever.  HENT: Positive for congestion. Negative for ear pain, postnasal drip, rhinorrhea, sinus pressure, sinus pain, sneezing  and sore throat.   Eyes: Negative for pain, discharge and redness.  Respiratory: Positive for cough (chronic). Negative for chest tightness and shortness of breath.        PMH (+) COPD  Cardiovascular: Negative for chest pain and palpitations.  Gastrointestinal: Positive for diarrhea (mild; "I have only had a few episodes") and nausea. Negative for abdominal pain and vomiting.  Musculoskeletal: Positive for myalgias. Negative for arthralgias, back pain and neck pain.  Skin: Negative for color change, pallor and rash.  Neurological: Negative for dizziness, syncope, weakness and headaches.  Hematological: Negative for adenopathy.     Vital Signs: Today's Vitals   09/09/19 1438 09/09/19 1440 09/09/19 1442 09/09/19 1502  BP:   139/85   Pulse:   83   Resp:   18   Temp:   98.6 F (37 C)   TempSrc:   Oral   SpO2:   98%   Weight:  169 lb (76.7 kg)    PainSc: 4    4     Physical Exam: Physical Exam  Constitutional: He is oriented to person, place, and time and well-developed, well-nourished, and in no distress.  HENT:  Head: Normocephalic and atraumatic.  Right Ear: Tympanic membrane normal.  Left Ear: Tympanic membrane normal.  Nose: Mucosal edema and rhinorrhea present. No sinus tenderness.  Mouth/Throat: Uvula is midline and mucous membranes are normal. Posterior oropharyngeal erythema (mild with (+) clear PND) present. No oropharyngeal exudate or posterior oropharyngeal edema.  Eyes: Pupils are equal, round, and reactive to light.  Cardiovascular: Normal rate, regular rhythm, normal heart sounds and intact distal pulses.  Pulmonary/Chest: Effort normal and breath sounds normal.  Musculoskeletal:     Cervical back: Normal range of motion and neck supple.  Neurological: He is alert and oriented to person, place, and time. Gait normal.  Skin: Skin is warm and dry. No rash noted. He is not diaphoretic.  Psychiatric: Mood, memory, affect and judgment normal.  Nursing note and vitals  reviewed.   Urgent Care Treatments / Results:  LABS: PLEASE NOTE: all labs that were ordered this encounter are listed, however only abnormal results are displayed. Labs Reviewed  NOVEL CORONAVIRUS, NAA (HOSP ORDER, SEND-OUT TO REF LAB; TAT 18-24 HRS)    EKG: -None  RADIOLOGY: No results found.  PROCEDURES: Procedures  MEDICATIONS RECEIVED THIS VISIT: Medications - No data to display  PERTINENT CLINICAL COURSE NOTES/UPDATES:   Initial Impression / Assessment and Plan / Urgent Care Course:  Pertinent labs & imaging results that were available during my care of the patient were personally reviewed by me and considered in my medical decision making (see lab/imaging section of note for values and interpretations).  EDAHI KIRTZ is a 58 y.o. male who presents to [facility] today with complaints of Generalized Body Aches, Diarrhea, Cough, Nasal Congestion, and COVID exposure   Patient overall well appearing and in no acute distress today in clinic. Presenting symptoms (see HPI) and exam as documented above. He presents with symptoms associated with SARS-CoV-2 (novel coronavirus). Patient in close contact with son who has tested positive for SARS-CoV-2 this week.  Discussed typical symptom constellation. Reviewed potential for infection and need for testing. Patient amenable to being tested. SARS-CoV-2 swab collected by certified clinical staff. Discussed variable turn around times associated with testing, as swabs are being processed at [facility], and have been taking between 2-5 days to come back. He was advised to self quarantine, per [facility] DHHS guidelines, until negative results received. These measures are being implemented out of an abundance of caution to prevent transmission and spread during the current SARS-CoV-2 pandemic.  Presenting symptoms consistent with acute viral illness. Symptoms minor at this time. Diarrhea was only a few episodes. No fevers, hypotension,  tachycardia, or hypoxia. Until ruled out with confirmatory lab testing, SARS-CoV-2 remains part of the differential. His testing is pending at this time. I discussed with him that his symptoms are felt to be viral in nature, thus antibiotics would not offer him any relief or improve his symptoms any faster than conservative symptomatic management. Discussed supportive care measures at home during acute phase of illness. Patient to rest as much as possible. He was encouraged to ensure adequate hydration (water and ORS) to prevent dehydration and electrolyte derangements. Patient may use APAP and/or IBU on an as needed basis for pain/fever.    Discussed follow up with primary care physician in 1 week for re-evaluation.  I have reviewed the follow up and strict return precautions for any new or worsening symptoms. Patient is aware of symptoms that would be deemed urgent/emergent, and would thus require further evaluation either here or in the emergency department. At the time of discharge, he verbalized understanding and consent with the discharge plan as it was reviewed with him. All questions were fielded by provider and/or clinic staff prior to patient discharge.    Final Clinical Impressions / Urgent Care Diagnoses:   Final diagnoses:  Viral URI with cough  Close exposure to COVID-19 virus  Encounter for laboratory testing for COVID-19 virus    New Prescriptions:  [place] Controlled Substance Registry consulted? Not Applicable  No orders of the defined types were placed in this encounter.   Recommended Follow up Care:  Patient encouraged to follow up with the following provider within the specified time frame, or sooner as dictated by the severity of his symptoms. As always, he was instructed that for any urgent/emergent care needs, he should seek care either here or in the emergency department for more immediate evaluation.  Follow-up Information    PCP In 1 week.   Why: General reassessment of  symptoms if not improving        NOTE: This note was prepared using Lobbyist along with smaller Company secretary. Despite my best ability to proofread, there is the potential that transcriptional errors may still occur from this process, and are completely unintentional.   Karen Kitchens, NP 09/10/19 1331

## 2019-09-13 ENCOUNTER — Telehealth: Payer: Self-pay | Admitting: General Practice

## 2019-09-13 NOTE — Telephone Encounter (Signed)
Neg covid result given to pt.

## 2019-09-19 ENCOUNTER — Encounter: Payer: Self-pay | Admitting: *Deleted

## 2019-09-19 ENCOUNTER — Telehealth: Payer: Self-pay | Admitting: *Deleted

## 2019-09-19 NOTE — Telephone Encounter (Signed)
Left message for patient to notify them that it is time to schedule 6 month low dose lung cancer screening CT scan. Instructed patient to call back to verify information prior to the scan being scheduled.

## 2019-10-03 ENCOUNTER — Ambulatory Visit: Payer: BLUE CROSS/BLUE SHIELD | Admitting: Family Medicine

## 2019-10-11 ENCOUNTER — Other Ambulatory Visit: Payer: Self-pay

## 2019-11-13 ENCOUNTER — Other Ambulatory Visit: Payer: Self-pay | Admitting: Family Medicine

## 2019-11-13 DIAGNOSIS — E119 Type 2 diabetes mellitus without complications: Secondary | ICD-10-CM

## 2019-11-21 DIAGNOSIS — K769 Liver disease, unspecified: Secondary | ICD-10-CM | POA: Insufficient documentation

## 2019-12-14 ENCOUNTER — Telehealth: Payer: Self-pay | Admitting: *Deleted

## 2019-12-14 DIAGNOSIS — Z87891 Personal history of nicotine dependence: Secondary | ICD-10-CM

## 2019-12-14 DIAGNOSIS — R918 Other nonspecific abnormal finding of lung field: Secondary | ICD-10-CM

## 2019-12-14 NOTE — Telephone Encounter (Signed)
Attempted to contact for lung screening follow up scan with rereferral from PCP. Left voicemail for patient to call back for scheduling.

## 2019-12-14 NOTE — Telephone Encounter (Signed)
Patient contacted and scheduled for LCS nodule follow up scan.

## 2019-12-14 NOTE — Addendum Note (Signed)
Addended by: Lieutenant Diego on: 12/14/2019 12:13 PM   Modules accepted: Orders

## 2019-12-17 ENCOUNTER — Other Ambulatory Visit: Payer: Self-pay

## 2019-12-17 DIAGNOSIS — I10 Essential (primary) hypertension: Secondary | ICD-10-CM

## 2019-12-17 DIAGNOSIS — I714 Abdominal aortic aneurysm, without rupture, unspecified: Secondary | ICD-10-CM

## 2019-12-17 NOTE — Telephone Encounter (Signed)
Hypertension medication request:07/04/19  Last office visit pertaining to hypertension:  BP Readings from Last 3 Encounters:  09/09/19 139/85  07/04/19 (!) 142/78  06/06/19 136/64    Lab Results  Component Value Date   CREATININE 0.75 06/06/2019   BUN 7 06/06/2019   NA 138 06/06/2019   K 4.3 06/06/2019   CL 103 06/06/2019   CO2 28 06/06/2019     No follow-ups on file.

## 2019-12-19 ENCOUNTER — Other Ambulatory Visit: Payer: Self-pay

## 2019-12-19 ENCOUNTER — Telehealth: Payer: Self-pay | Admitting: *Deleted

## 2019-12-19 DIAGNOSIS — I714 Abdominal aortic aneurysm, without rupture, unspecified: Secondary | ICD-10-CM

## 2019-12-19 DIAGNOSIS — K219 Gastro-esophageal reflux disease without esophagitis: Secondary | ICD-10-CM

## 2019-12-19 DIAGNOSIS — E782 Mixed hyperlipidemia: Secondary | ICD-10-CM

## 2019-12-19 DIAGNOSIS — I10 Essential (primary) hypertension: Secondary | ICD-10-CM

## 2019-12-19 DIAGNOSIS — I700 Atherosclerosis of aorta: Secondary | ICD-10-CM

## 2019-12-19 NOTE — Telephone Encounter (Signed)
Contacted patient and notified him that his insurance is out of network for [employer]. He is going to contact his PCP to schedule with an in network provider.

## 2019-12-19 NOTE — Telephone Encounter (Signed)
Hypertension medication [id number]  Last office visit pertaining to hypertension:  BP Readings from Last 3 Encounters:  09/09/19 139/85  07/04/19 (!) 142/78  06/06/19 136/64    Lab Results  Component Value Date   CREATININE 0.75 06/06/2019   BUN 7 06/06/2019   NA 138 06/06/2019   K 4.3 06/06/2019   CL 103 06/06/2019   CO2 28 06/06/2019     No follow-ups on file.   Ref Range & Units 6 mo ago 1 yr ago  Cholesterol <200 mg/dL 86  100   HDL > OR = 40 mg/dL 29Low   28Low    Triglycerides <150 mg/dL 96  94   LDL Cholesterol (Calc) mg/dL (calc) 39  54 CM

## 2019-12-20 ENCOUNTER — Ambulatory Visit: Payer: BLUE CROSS/BLUE SHIELD

## 2020-01-23 ENCOUNTER — Other Ambulatory Visit: Payer: Self-pay

## 2020-01-23 DIAGNOSIS — I700 Atherosclerosis of aorta: Secondary | ICD-10-CM

## 2020-01-23 DIAGNOSIS — I714 Abdominal aortic aneurysm, without rupture, unspecified: Secondary | ICD-10-CM

## 2020-01-23 DIAGNOSIS — E782 Mixed hyperlipidemia: Secondary | ICD-10-CM

## 2020-01-23 MED ORDER — ATORVASTATIN CALCIUM 80 MG PO TABS: 80.0000 mg | ORAL_TABLET | Freq: Every day | ORAL | 1 refills | Status: DC

## 2020-03-04 ENCOUNTER — Other Ambulatory Visit: Payer: Self-pay

## 2020-03-04 ENCOUNTER — Ambulatory Visit (INDEPENDENT_AMBULATORY_CARE_PROVIDER_SITE_OTHER): Payer: BLUE CROSS/BLUE SHIELD | Admitting: Gastroenterology

## 2020-03-04 ENCOUNTER — Encounter: Payer: Self-pay | Admitting: Gastroenterology

## 2020-03-04 VITALS — BP 137/69 | HR 77 | Temp 97.6°F | Wt 184.2 lb

## 2020-03-04 DIAGNOSIS — R1013 Epigastric pain: Secondary | ICD-10-CM | POA: Diagnosis not present

## 2020-03-04 DIAGNOSIS — R195 Other fecal abnormalities: Secondary | ICD-10-CM

## 2020-03-04 DIAGNOSIS — R110 Nausea: Secondary | ICD-10-CM | POA: Diagnosis not present

## 2020-03-04 NOTE — Patient Instructions (Addendum)
Please stop taking your Pantoprazole for the next two weeks until you do the breath test and then you could restart it.  Please come back to our office and have your H. Pylori breat test done in 2 weeks. Please give [place] a call and ask if the lab tech is in the office.  Gastroesophageal Reflux Disease, Adult Gastroesophageal reflux (GER) happens when acid from the stomach flows up into the tube that connects the mouth and the stomach (esophagus). Normally, food travels down the esophagus and stays in the stomach to be digested. With GER, food and stomach acid sometimes move back up into the esophagus. You may have a disease called gastroesophageal reflux disease (GERD) if the reflux:  Happens often.  Causes frequent or very bad symptoms.  Causes problems such as damage to the esophagus. When this happens, the esophagus becomes sore and swollen (inflamed). Over time, GERD can make small holes (ulcers) in the lining of the esophagus. What are the causes? This condition is caused by a problem with the muscle between the esophagus and the stomach. When this muscle is weak or not normal, it does not close properly to keep food and acid from coming back up from the stomach. The muscle can be weak because of:  Tobacco use.  Pregnancy.  Having a certain type of hernia (hiatal hernia).  Alcohol use.  Certain foods and drinks, such as coffee, chocolate, onions, and peppermint. What increases the risk? You are more likely to develop this condition if you:  Are overweight.  Have a disease that affects your connective tissue.  Use NSAID medicines. What are the signs or symptoms? Symptoms of this condition include:  Heartburn.  Difficult or painful swallowing.  The feeling of having a lump in the throat.  A bitter taste in the mouth.  Bad breath.  Having a lot of saliva.  Having an upset or bloated stomach.  Belching.  Chest pain. Different conditions can cause chest pain. Make  sure you see your doctor if you have chest pain.  Shortness of breath or noisy breathing (wheezing).  Ongoing (chronic) cough or a cough at night.  Wearing away of the surface of teeth (tooth enamel).  Weight loss. How is this treated? Treatment will depend on how bad your symptoms are. Your doctor may suggest:  Changes to your diet.  Medicine.  Surgery. Follow these instructions at home: Eating and drinking   Follow a diet as told by your doctor. You may need to avoid foods and drinks such as: ? Coffee and tea (with or without caffeine). ? Drinks that contain alcohol. ? Energy drinks and sports drinks. ? Bubbly (carbonated) drinks or sodas. ? Chocolate and cocoa. ? Peppermint and mint flavorings. ? Garlic and onions. ? Horseradish. ? Spicy and acidic foods. These include peppers, chili powder, curry powder, vinegar, hot sauces, and BBQ sauce. ? Citrus fruit juices and citrus fruits, such as oranges, lemons, and limes. ? Tomato-based foods. These include red sauce, chili, salsa, and pizza with red sauce. ? Fried and fatty foods. These include donuts, french fries, potato chips, and high-fat dressings. ? High-fat meats. These include hot dogs, rib eye steak, sausage, ham, and bacon. ? High-fat dairy items, such as whole milk, butter, and cream cheese.  Eat small meals often. Avoid eating large meals.  Avoid drinking large amounts of liquid with your meals.  Avoid eating meals during the 2-3 hours before bedtime.  Avoid lying down right after you eat.  Do not  exercise right after you eat. Lifestyle   Do not use any products that contain nicotine or tobacco. These include cigarettes, e-cigarettes, and chewing tobacco. If you need help quitting, ask your doctor.  Try to lower your stress. If you need help doing this, ask your doctor.  If you are overweight, lose an amount of weight that is healthy for you. Ask your doctor about a safe weight loss goal. General  instructions  Pay attention to any changes in your symptoms.  Take over-the-counter and prescription medicines only as told by your doctor. Do not take aspirin, ibuprofen, or other NSAIDs unless your doctor says it is okay.  Wear loose clothes. Do not wear anything tight around your waist.  Raise (elevate) the head of your bed about 6 inches (15 cm).  Avoid bending over if this makes your symptoms worse.  Keep all follow-up visits as told by your doctor. This is important. Contact a doctor if:  You have new symptoms.  You lose weight and you do not know why.  You have trouble swallowing or it hurts to swallow.  You have wheezing or a cough that keeps happening.  Your symptoms do not get better with treatment.  You have a hoarse voice. Get help right away if:  You have pain in your arms, neck, jaw, teeth, or back.  You feel sweaty, dizzy, or light-headed.  You have chest pain or shortness of breath.  You throw up (vomit) and your throw-up looks like blood or coffee grounds.  You pass out (faint).  Your poop (stool) is bloody or black.  You cannot swallow, drink, or eat. Summary  If a person has gastroesophageal reflux disease (GERD), food and stomach acid move back up into the esophagus and cause symptoms or problems such as damage to the esophagus.  Treatment will depend on how bad your symptoms are.  Follow a diet as told by your doctor.  Take all medicines only as told by your doctor. This information is not intended to replace advice given to you by your health care provider. Make sure you discuss any questions you have with your health care provider. Document Revised: 03/22/2018 Document Reviewed: 03/22/2018 Elsevier Patient Education  [place].

## 2020-03-04 NOTE — Progress Notes (Signed)
[name] [address]  [place], [place] [address]  Main: [phone]  Fax: [phone]   Gastroenterology Consultation  Referring Provider:     Delsa Grana, PA-C Primary Care Physician:  [name], MD Reason for Consultation:   Nausea        HPI:    Chief Complaint  Patient presents with  . Follow-up  . Nausea    Patient stated that he had been nauseated for several months.     Scott Rosario is a 59 y.o. y/o male referred for consultation & management  by Dr. Ellison Hughs, Chrissie Noa, MD.  Patient reports 6 to 54-month history of nausea, specifically after eating.  No vomiting.  No weight loss.  Has history of H. pylori treated in 2019.  This was diagnosed on upper endoscopy with biopsies.  Upper endoscopy otherwise reassuring besides gastric erythema at that time.  Colonoscopy screening with small tubular adenoma polyps removed in 2020.  Patient also has chronically elevated alkaline phosphatase and has had liver biopsies before possible.  Sees [facility] for this.  Last saw them in February 2021.  We do not have access to their note in care everywhere.  They discontinued his ursodiol.  Usually has 1-2 bowel movements a day, but as of the last month has had 3-4 loose bowel movements a day.  No blood in stool.  Past Medical History:  Diagnosis Date  . Acute calculous cholecystitis   . Alkaline phosphatase elevation   . Aortic aneurysm ([place]) 12/10/2018   3.4 cm March 2020  . Aortic atherosclerosis ([place])    on CT 02/25/14  . Asthma   . CAD (coronary artery disease)   . Cirrhosis ([place])   . Cirrhosis ([place])   . COPD (chronic obstructive pulmonary disease) ([place])   . Diabetes mellitus without complication ([place])   . Emphysema of lung ([place])   . Gallstone 12/10/2018  . Gastritis without bleeding   . GERD (gastroesophageal reflux disease)   . Heart attack ([place])    2005, 2017 (stent after each)  . History of hiatal hernia    small  . History of kidney  stones    h/o  . Hyperlipidemia   . Hypertension   . Ingrown toenail of both feet 11/29/2018  . Pneumonia 2018  . Pulmonary nodules    x 3, (25mm, 61mm, 40mm) chest CT February 25, 2014  . Renal cyst    on CT 02/25/14  . Tobacco use     Past Surgical History:  Procedure Laterality Date  . APPENDECTOMY    . CARDIAC CATHETERIZATION Left 03/09/2016   Procedure: Left Heart Cath and Coronary Angiography;  Surgeon: Corey Skains, MD;  Location: [facility];  Service: Cardiovascular;  Laterality: Left;  . CARDIAC CATHETERIZATION N/A 03/09/2016   Procedure: Coronary Stent Intervention;  Surgeon: Isaias Cowman, MD;  Location: [facility];  Service: Cardiovascular;  Laterality: N/A;  . CHOLECYSTECTOMY N/A 01/31/2019   Procedure: LAPAROSCOPIC CHOLECYSTECTOMY - DIABETIC;  Surgeon: Vickie Epley, MD;  Location: [facility];  Service: General;  Laterality: N/A;  . COLONOSCOPY WITH PROPOFOL N/A 03/22/2019   Procedure: COLONOSCOPY WITH PROPOFOL;  Surgeon: Lucilla Lame, MD;  Location: [facility];  Service: Endoscopy;  Laterality: N/A;  Diabetic - insulin and oral meds  . CORONARY ANGIOPLASTY WITH STENT PLACEMENT  10/25/2003   x 2  . ESOPHAGOGASTRODUODENOSCOPY (EGD) WITH PROPOFOL N/A 10/24/2017   Procedure: ESOPHAGOGASTRODUODENOSCOPY (EGD) WITH PROPOFOL;  Surgeon: Lucilla Lame, MD;  Location: [facility];  Service: Endoscopy;  Laterality: N/A;  Diabetic - oral meds  . POLYPECTOMY  03/22/2019   Procedure: POLYPECTOMY;  Surgeon: Lucilla Lame, MD;  Location: [facility];  Service: Endoscopy;;    Prior to Admission medications   Medication Sig Start Date End Date Taking? Authorizing Provider  albuterol (PROVENTIL HFA;VENTOLIN HFA) 108 (90 Base) MCG/ACT inhaler Inhale 2 puffs into the lungs every 4 (four) hours as needed for wheezing or shortness of breath. 12/21/18  Yes Poulose, Bethel Born, NP  ascorbic acid (VITAMIN C) 1000 MG tablet Take 1 tablet by mouth 1 day or 1  dose.   Yes [provider]  aspirin EC 81 MG tablet Take 81 mg by mouth daily.   Yes [provider]  atorvastatin (LIPITOR) 80 MG tablet Take 1 tablet (80 mg total) by mouth at bedtime. 01/23/20  Yes Delsa Grana, PA-C  baclofen (LIORESAL) 10 MG tablet Take 1 tablet by mouth as needed. 02/26/20 03/27/20 Yes [provider]  clopidogrel (PLAVIX) 75 MG tablet Take 1 tablet by mouth daily.   Yes [provider]  folic acid-pyridoxine-cyancobalamin (FOLTX) 2.5-25-2 MG TABS tablet Take 1 tablet by mouth daily.   Yes [provider]  glipiZIDE (GLUCOTROL XL) 5 MG 24 hr tablet Take 1 tablet (5 mg total) by mouth daily with breakfast. Hold if morning sugar <100 or if not eating during the day 07/04/19  Yes Delsa Grana, PA-C  Insulin Glargine (LANTUS SOLOSTAR) 100 UNIT/ML Solostar Pen Inject 12 Units into the skin at bedtime. Patient taking differently: Inject 12-14 Units into the skin at bedtime.  07/25/18  Yes Poulose, Bethel Born, NP  Insulin Pen Needle 32G X 4 MM MISC Use as directed with insulin daily 06/06/19  Yes Tapia, Leisa, PA-C  Lactobacillus Rhamnosus, GG, (RA PROBIOTIC DIGESTIVE CARE) CAPS Take 1 capsule by mouth daily.   Yes [provider]  losartan (COZAAR) 50 MG tablet Take 1 tablet (50 mg total) by mouth every evening. 03/05/19  Yes Poulose, Bethel Born, NP  metFORMIN (GLUCOPHAGE) 500 MG tablet TAKE 2 TABLETS BY MOUTH TWICE DAILY WITHA MEAL 11/13/19  Yes Hubbard Hartshorn, FNP  metoprolol succinate (TOPROL-XL) 25 MG 24 hr tablet Take 1 tablet (25 mg total) by mouth every morning. 03/05/19  Yes Poulose, Bethel Born, NP  pantoprazole (PROTONIX) 40 MG tablet Take 1 tablet (40 mg total) by mouth 2 (two) times daily. 03/05/19  Yes Poulose, Bethel Born, NP  umeclidinium-vilanterol (ANORO ELLIPTA) 62.5-25 MCG/INH AEPB Inhale 1 puff into the lungs daily. 02/27/20  Yes [provider]  nitroGLYCERIN (NITROSTAT) 0.4 MG SL tablet Place 1 tablet (0.4 mg  total) under the tongue every 5 (five) minutes as needed for chest pain. Max of 3 total; call 911 Patient not taking: Reported on 03/04/2020 09/22/17   Arnetha Courser, MD    Family History  Problem Relation Age of Onset  . Diabetes Mother   . Hypertension Mother   . Arthritis Mother   . Hyperlipidemia Mother   . Heart attack Father   . Epilepsy Father   . Diabetes Father   . Seizures Father   . Heart disease Father   . Hyperlipidemia Father   . Hypertension Father   . Stroke Father   . Diabetes Brother   . Hyperlipidemia Brother   . Hypertension Brother   . Cancer Maternal Grandmother        unknown  . Diabetes Paternal Grandmother   .  Heart disease Paternal Grandmother   . Hyperlipidemia Paternal Grandmother   . Hypertension Paternal Grandmother   . Heart attack Paternal Grandfather      Social History   Tobacco Use  . Smoking status: Current Every Day Smoker    Packs/day: 1.00    Years: 45.00    Pack years: 45.00    Types: Cigarettes  . Smokeless tobacco: Never Used  . Tobacco comment: since age 54  Substance Use Topics  . Alcohol use: No    Alcohol/week: 0.0 standard drinks  . Drug use: No    Allergies as of 03/04/2020 - Review Complete 03/04/2020  Allergen Reaction Noted  . Canagliflozin Other (See Comments) 01/25/2017  . Semaglutide Diarrhea and Nausea And Vomiting 10/19/2017  . Tape  01/25/2019    Review of Systems:    All systems reviewed and negative except where noted in HPI.   Physical Exam:  BP 137/69   Pulse 77   Temp 97.6 F (36.4 C) (Oral)   Wt 184 lb 3.2 oz (83.6 kg)   BMI 26.43 kg/m  No LMP for male patient. Psych:  Alert and cooperative. Normal mood and affect. General:   Alert,  Well-developed, well-nourished, pleasant and cooperative in NAD Head:  Normocephalic and atraumatic. Eyes:  Sclera clear, no icterus.   Conjunctiva pink. Ears:  Normal auditory acuity. Nose:  No deformity, discharge, or lesions. Mouth:  No deformity or  lesions,oropharynx pink & moist. Neck:  Supple; no masses or thyromegaly. Abdomen:  Normal bowel sounds.  No bruits.  Soft, non-tender and non-distended without masses, hepatosplenomegaly or hernias noted.  No guarding or rebound tenderness.    Msk:  Symmetrical without gross deformities. Good, equal movement & strength bilaterally. Pulses:  Normal pulses noted. Extremities:  No clubbing or edema.  No cyanosis. Neurologic:  Alert and oriented x3;  grossly normal neurologically. Skin:  Intact without significant lesions or rashes. No jaundice. Lymph Nodes:  No significant cervical adenopathy. Psych:  Alert and cooperative. Normal mood and affect.   Labs: CBC    Component Value Date/Time   WBC 13.1 (H) 06/06/2019 0000   RBC 4.73 06/06/2019 0000   HGB 14.2 06/06/2019 0000   HGB 16.0 02/17/2016 0940   HCT 42.1 06/06/2019 0000   HCT 47.2 02/17/2016 0940   PLT 246 06/06/2019 0000   PLT 220 02/17/2016 0940   MCV 89.0 06/06/2019 0000   MCV 88 02/17/2016 0940   MCH 30.0 06/06/2019 0000   MCHC 33.7 06/06/2019 0000   RDW 12.9 06/06/2019 0000   RDW 13.9 02/17/2016 0940   LYMPHSABS 1,349 06/06/2019 0000   LYMPHSABS 1.8 02/17/2016 0940   MONOABS 1.2 (H) 01/17/2019 1722   EOSABS 105 06/06/2019 0000   EOSABS 0.2 02/17/2016 0940   BASOSABS 66 06/06/2019 0000   BASOSABS 0.1 02/17/2016 0940   CMP     Component Value Date/Time   NA 138 06/06/2019 0000   NA 138 02/17/2016 0940   K 4.3 06/06/2019 0000   CL 103 06/06/2019 0000   CO2 28 06/06/2019 0000   GLUCOSE 158 (H) 06/06/2019 0000   BUN 7 06/06/2019 0000   BUN 14 02/17/2016 0940   CREATININE 0.75 06/06/2019 0000   CALCIUM 9.3 06/06/2019 0000   PROT 6.0 (L) 06/06/2019 0000   PROT 6.2 02/28/2019 1013   ALBUMIN 4.3 02/28/2019 1013   AST 11 06/06/2019 0000   ALT 15 06/06/2019 0000   ALKPHOS 148 (H) 02/28/2019 1013   BILITOT 1.1 06/06/2019 0000  BILITOT 0.9 02/28/2019 1013   GFRNONAA 100 06/06/2019 0000   GFRAA 116 06/06/2019  0000    Imaging Studies: No results found.  Assessment and Plan:   Scott Rosario is a 59 y.o. y/o male has been referred for nausea  Due to previous history of H. pylori in 2019, obtain H. pylori breath test after holding PPI for 2 weeks.  Patient instructed to hold PPI and avoid exacerbating foods.  GERD handout given.  Obtain office notes from [place] visit.  Is their working diagnosis still [place]?  [place] patients have increased likelihood of developing UC.  If patient's loose stools continue, we may need to repeat colonoscopy  Obtain GI infectious work-up at this time due to loose stools for the last month    Dr [name]  Speech recognition software was used to dictate the above note.

## 2020-03-10 ENCOUNTER — Inpatient Hospital Stay: Payer: BLUE CROSS/BLUE SHIELD | Attending: Oncology | Admitting: Oncology

## 2020-03-10 ENCOUNTER — Other Ambulatory Visit: Payer: Self-pay

## 2020-03-10 ENCOUNTER — Inpatient Hospital Stay: Payer: BLUE CROSS/BLUE SHIELD

## 2020-03-10 ENCOUNTER — Encounter: Payer: Self-pay | Admitting: Oncology

## 2020-03-10 VITALS — BP 136/73 | HR 83 | Temp 97.5°F | Resp 18 | Ht 68.0 in | Wt 183.9 lb

## 2020-03-10 DIAGNOSIS — E119 Type 2 diabetes mellitus without complications: Secondary | ICD-10-CM | POA: Diagnosis not present

## 2020-03-10 DIAGNOSIS — F17210 Nicotine dependence, cigarettes, uncomplicated: Secondary | ICD-10-CM | POA: Insufficient documentation

## 2020-03-10 DIAGNOSIS — J449 Chronic obstructive pulmonary disease, unspecified: Secondary | ICD-10-CM | POA: Insufficient documentation

## 2020-03-10 DIAGNOSIS — K219 Gastro-esophageal reflux disease without esophagitis: Secondary | ICD-10-CM | POA: Insufficient documentation

## 2020-03-10 DIAGNOSIS — I252 Old myocardial infarction: Secondary | ICD-10-CM | POA: Insufficient documentation

## 2020-03-10 DIAGNOSIS — E785 Hyperlipidemia, unspecified: Secondary | ICD-10-CM | POA: Diagnosis not present

## 2020-03-10 DIAGNOSIS — I10 Essential (primary) hypertension: Secondary | ICD-10-CM | POA: Diagnosis not present

## 2020-03-10 DIAGNOSIS — D729 Disorder of white blood cells, unspecified: Secondary | ICD-10-CM

## 2020-03-10 DIAGNOSIS — I2510 Atherosclerotic heart disease of native coronary artery without angina pectoris: Secondary | ICD-10-CM | POA: Insufficient documentation

## 2020-03-10 DIAGNOSIS — Z79899 Other long term (current) drug therapy: Secondary | ICD-10-CM | POA: Diagnosis not present

## 2020-03-10 DIAGNOSIS — Z7982 Long term (current) use of aspirin: Secondary | ICD-10-CM | POA: Insufficient documentation

## 2020-03-10 DIAGNOSIS — D72829 Elevated white blood cell count, unspecified: Secondary | ICD-10-CM | POA: Diagnosis not present

## 2020-03-10 DIAGNOSIS — Z794 Long term (current) use of insulin: Secondary | ICD-10-CM | POA: Insufficient documentation

## 2020-03-10 LAB — CBC WITH DIFFERENTIAL/PLATELET
Abs Immature Granulocytes: 0.03 10*3/uL (ref 0.00–0.07)
Basophils Absolute: 0.1 10*3/uL (ref 0.0–0.1)
Basophils Relative: 1 %
Eosinophils Absolute: 0.2 10*3/uL (ref 0.0–0.5)
Eosinophils Relative: 2 %
HCT: 40.9 % (ref 39.0–52.0)
Hemoglobin: 14.1 g/dL (ref 13.0–17.0)
Immature Granulocytes: 0 %
Lymphocytes Relative: 13 %
Lymphs Abs: 1.5 10*3/uL (ref 0.7–4.0)
MCH: 29.6 pg (ref 26.0–34.0)
MCHC: 34.5 g/dL (ref 30.0–36.0)
MCV: 85.9 fL (ref 80.0–100.0)
Monocytes Absolute: 1.2 10*3/uL — ABNORMAL HIGH (ref 0.1–1.0)
Monocytes Relative: 10 %
Neutro Abs: 8.8 10*3/uL — ABNORMAL HIGH (ref 1.7–7.7)
Neutrophils Relative %: 74 %
Platelets: 215 10*3/uL (ref 150–400)
RBC: 4.76 MIL/uL (ref 4.22–5.81)
RDW: 12.9 % (ref 11.5–15.5)
WBC: 11.8 10*3/uL — ABNORMAL HIGH (ref 4.0–10.5)
nRBC: 0 % (ref 0.0–0.2)

## 2020-03-10 LAB — TECHNOLOGIST SMEAR REVIEW: Plt Morphology: NORMAL

## 2020-03-10 NOTE — Progress Notes (Signed)
New patient visit, pt's wife present.  States increased coughing the pass 2 days and increased fatigue.

## 2020-03-10 NOTE — Progress Notes (Signed)
Hematology/Oncology Consult note [facility] [phone] [phone]  Patient Care Team: [name], MD as PCP - General (Family Medicine) Christene Lye, MD (General Surgery) Arnetha Courser, MD as Attending Physician (Family Medicine) Lucilla Lame, MD as Consulting Physician (Gastroenterology) Corey Skains, MD as Consulting Physician (Cardiology) Vickie Epley, MD (Inactive) as Consulting Physician (General Surgery) Warnell Forester, NP as Nurse Practitioner (Surgery)   Name of the patient: Scott Rosario  [id number]  [birth year]    Reason for referral-leukocytosis   Referring physician-Dr. Ellison Hughs  Date of visit: 03/10/20   History of presenting illness- Patient is a 59 year old male with a past medical history significant for hypertension, diabetes among other medical problems.  He has been referred to [place] for leukocytosis.  Most recent CBC from 02/26/2020 showed white cell count of 13, H&H of 15/43.7 and a platelet count of 209.  Differential mainly shows neutrophilia with an absolute neutrophil count of 9.7.  His recent labs also showed low B12 level of 274.  Looking back at his labs patient has had chronic leukocytosis with a white cell count that has fluctuated between 10-22.  He has had chronic neutrophilia with an [place] that fluctuates between 8-19.  Patient reports having headaches, fatigue and ongoing nonproductive cough which has been going on for the last 1 week.  Denies any fever.He has seen pulmonary for the same.  Also reports ongoing nausea for the last several months for which he sees GI.  ECOG PS- 1  Pain scale- 0   Review of systems- Review of Systems  Constitutional: Positive for malaise/fatigue. Negative for chills, fever and weight loss.  HENT: Negative for congestion, ear discharge and nosebleeds.   Eyes: Negative for blurred vision.  Respiratory: Positive for cough. Negative for  hemoptysis, sputum production, shortness of breath and wheezing.   Cardiovascular: Negative for chest pain, palpitations, orthopnea and claudication.  Gastrointestinal: Positive for nausea. Negative for abdominal pain, blood in stool, constipation, diarrhea, heartburn, melena and vomiting.  Genitourinary: Negative for dysuria, flank pain, frequency, hematuria and urgency.  Musculoskeletal: Negative for back pain, joint pain and myalgias.  Skin: Negative for rash.  Neurological: Positive for headaches. Negative for dizziness, tingling, focal weakness, seizures and weakness.  Endo/Heme/Allergies: Does not bruise/bleed easily.  Psychiatric/Behavioral: Negative for depression and suicidal ideas. The patient does not have insomnia.     Allergies  Allergen Reactions  . Canagliflozin Other (See Comments)    adverse reactions: Fatigue and Zombie feeling   . Semaglutide Diarrhea and Nausea And Vomiting    Ozempic  Acid Reflux  . Tape     SURGICAL TAPE-RIPPED SKIN OFF    Patient Active Problem List   Diagnosis Date Noted  . Liver disease 11/21/2019  . Polyp of sigmoid colon   . Aortic aneurysm ([place]) 12/10/2018  . Overweight (BMI 25.0-29.9) 12/20/2017  . Acquired trigger finger 02/15/2017  . COPD (chronic obstructive pulmonary disease) ([place])   . Diabetes type 2, uncontrolled ([place])   . GERD (gastroesophageal reflux disease)   . Hyperlipidemia   . Hypertension   . Tobacco use   . CAD (coronary artery disease)   . Aortic atherosclerosis ([place])   . Multiple pulmonary nodules determined by computed tomography of lung 12/31/2014     Past Medical History:  Diagnosis Date  . Acute calculous cholecystitis   . Alkaline phosphatase elevation   . Aortic aneurysm ([place]) 12/10/2018   3.4 cm March 2020  . Aortic atherosclerosis ([place])  on CT 02/25/14  . Asthma   . CAD (coronary artery disease)   . Cirrhosis ([place])   . Cirrhosis ([place])   . COPD (chronic obstructive pulmonary disease) ([place])   .  Diabetes mellitus without complication ([place])   . Emphysema of lung ([place])   . Gallstone 12/10/2018  . Gastritis without bleeding   . GERD (gastroesophageal reflux disease)   . Heart attack ([place])    2005, 2017 (stent after each)  . History of hiatal hernia    small  . History of kidney stones    h/o  . Hyperlipidemia   . Hypertension   . Ingrown toenail of both feet 11/29/2018  . Pneumonia 2018  . Pulmonary nodules    x 3, (33m, 5676m 76m56mchest CT February 25, 2014  . Renal cyst    on CT 02/25/14  . Tobacco use      Past Surgical History:  Procedure Laterality Date  . APPENDECTOMY    . CARDIAC CATHETERIZATION Left 03/09/2016   Procedure: Left Heart Cath and Coronary Angiography;  Surgeon: BruCorey SkainsD;  [facility];  Service: Cardiovascular;  Laterality: Left;  . CARDIAC CATHETERIZATION N/A 03/09/2016   Procedure: Coronary Stent Intervention;  Surgeon: AleIsaias CowmanD;  [facility];  Service: Cardiovascular;  Laterality: N/A;  . CHOLECYSTECTOMY N/A 01/31/2019   Procedure: LAPAROSCOPIC CHOLECYSTECTOMY - DIABETIC;  Surgeon: DavVickie EpleyD;  Location: [facility];  Service: General;  Laterality: N/A;  . COLONOSCOPY WITH PROPOFOL N/A 03/22/2019   Procedure: COLONOSCOPY WITH PROPOFOL;  Surgeon: WohLucilla LameD;  [facility];  Laterality: N/A;  Diabetic - insulin and oral meds  . CORONARY ANGIOPLASTY WITH STENT PLACEMENT  10/25/2003   x 2  . ESOPHAGOGASTRODUODENOSCOPY (EGD) WITH PROPOFOL N/A 10/24/2017   Procedure: ESOPHAGOGASTRODUODENOSCOPY (EGD) WITH PROPOFOL;  Surgeon: WohLucilla LameD;  [facility];  Laterality: N/A;  Diabetic - oral meds  . POLYPECTOMY  03/22/2019   Procedure: POLYPECTOMY;  Surgeon: WohLucilla LameD;  [facility];;    Social History   Socioeconomic History  . Marital status: Married    Spouse name: Not  on file  . Number of children: Not on file  . Years of education: Not on file  . Highest education level: Not on file  Occupational History  . Not on file  Tobacco Use  . Smoking status: Current Every Day Smoker    Packs/day: 1.00    Years: 45.00    Pack years: 45.00    Types: Cigarettes  . Smokeless tobacco: Never Used  . Tobacco comment: since age 86 49aping Use  . Vaping Use: Never used  Substance and Sexual Activity  . Alcohol use: No    Alcohol/week: 0.0 standard drinks  . Drug use: No  . Sexual activity: Yes  Other Topics Concern  . Not on file  Social History Narrative   Active and independent at baseline   Social Determinants of Health   Financial Resource Strain:   . Difficulty of Paying Living Expenses:   Food Insecurity:   . Worried About RunCharity fundraiser the Last Year:   . RanArboriculturist the Last Year:   Transportation Needs:   . LacFilm/video editoredical):   . LMarland Kitchenck of Transportation (Non-Medical):   Physical Activity:   . Days of Exercise per Week:   .  Minutes of Exercise per Session:   Stress:   . Feeling of Stress :   Social Connections:   . Frequency of Communication with Friends and Family:   . Frequency of Social Gatherings with Friends and Family:   . Attends Religious Services:   . Active Member of Clubs or Organizations:   . Attends Archivist Meetings:   Marland Kitchen Marital Status:   Intimate Partner Violence:   . Fear of Current or Ex-Partner:   . Emotionally Abused:   Marland Kitchen Physically Abused:   . Sexually Abused:      Family History  Problem Relation Age of Onset  . Diabetes Mother   . Hypertension Mother   . Arthritis Mother   . Hyperlipidemia Mother   . Heart attack Father   . Epilepsy Father   . Diabetes Father   . Seizures Father   . Heart disease Father   . Hyperlipidemia Father   . Hypertension Father   . Stroke Father   . Diabetes Brother   . Hyperlipidemia Brother   . Hypertension Brother   . Cancer  Maternal Grandmother        unknown  . Diabetes Paternal Grandmother   . Heart disease Paternal Grandmother   . Hyperlipidemia Paternal Grandmother   . Hypertension Paternal Grandmother   . Heart attack Paternal Grandfather      Current Outpatient Medications:  .  albuterol (PROVENTIL HFA;VENTOLIN HFA) 108 (90 Base) MCG/ACT inhaler, Inhale 2 puffs into the lungs every 4 (four) hours as needed for wheezing or shortness of breath., Disp: 1 Inhaler, Rfl: 1 .  ascorbic acid (VITAMIN C) 1000 MG tablet, Take 1 tablet by mouth 1 day or 1 dose., Disp: , Rfl:  .  aspirin EC 81 MG tablet, Take 81 mg by mouth daily., Disp: , Rfl:  .  atorvastatin (LIPITOR) 80 MG tablet, Take 1 tablet (80 mg total) by mouth at bedtime., Disp: 90 tablet, Rfl: 1 .  clopidogrel (PLAVIX) 75 MG tablet, Take 1 tablet by mouth daily., Disp: , Rfl:  .  folic acid-pyridoxine-cyancobalamin (FOLTX) 2.5-25-2 MG TABS tablet, Take 1 tablet by mouth daily., Disp: , Rfl:  .  glipiZIDE (GLUCOTROL XL) 5 MG 24 hr tablet, Take 1 tablet (5 mg total) by mouth daily with breakfast. Hold if morning sugar <100 or if not eating during the day, Disp: 90 tablet, Rfl: 0 .  Insulin Glargine (LANTUS SOLOSTAR) 100 UNIT/ML Solostar Pen, Inject 12 Units into the skin at bedtime. (Patient taking differently: Inject 12-14 Units into the skin at bedtime. ), Disp: 15 mL, Rfl: 2 .  Insulin Pen Needle 32G X 4 MM MISC, Use as directed with insulin daily, Disp: 100 each, Rfl: 1 .  Lactobacillus Rhamnosus, GG, (RA PROBIOTIC DIGESTIVE CARE) CAPS, Take 1 capsule by mouth daily., Disp: , Rfl:  .  losartan (COZAAR) 50 MG tablet, Take 1 tablet (50 mg total) by mouth every evening., Disp: 90 tablet, Rfl: 1 .  metFORMIN (GLUCOPHAGE) 500 MG tablet, TAKE 2 TABLETS BY MOUTH TWICE DAILY WITHA MEAL, Disp: 180 tablet, Rfl: 2 .  metoprolol succinate (TOPROL-XL) 25 MG 24 hr tablet, Take 1 tablet (25 mg total) by mouth every morning., Disp: 90 tablet, Rfl: 1 .   umeclidinium-vilanterol (ANORO ELLIPTA) 62.5-25 MCG/INH AEPB, Inhale 1 puff into the lungs daily., Disp: , Rfl:  .  baclofen (LIORESAL) 10 MG tablet, Take 1 tablet by mouth as needed. (Patient not taking: Reported on 03/10/2020), Disp: , Rfl:  .  nitroGLYCERIN (NITROSTAT) 0.4 MG SL tablet, Place 1 tablet (0.4 mg total) under the tongue every 5 (five) minutes as needed for chest pain. Max of 3 total; call 911 (Patient not taking: Reported on 03/04/2020), Disp: 25 tablet, Rfl: 5 .  pantoprazole (PROTONIX) 40 MG tablet, Take 1 tablet (40 mg total) by mouth 2 (two) times daily. (Patient not taking: Reported on 03/10/2020), Disp: 180 tablet, Rfl: 1   Physical exam:  Vitals:   03/10/20 1350  BP: 136/73  Pulse: 83  Resp: 18  Temp: (!) 97.5 F (36.4 C)  TempSrc: Tympanic  SpO2: 99%  Weight: 183 lb 14.4 oz (83.4 kg)  Height: '5\' 8"'  (1.727 m)   Physical Exam Constitutional:      General: He is not in acute distress. Cardiovascular:     Rate and Rhythm: Normal rate and regular rhythm.     Heart sounds: Normal heart sounds.  Pulmonary:     Effort: Pulmonary effort is normal.     Breath sounds: Normal breath sounds.  Abdominal:     General: Bowel sounds are normal.     Palpations: Abdomen is soft.     Comments: No palpable hepatosplenomegaly  Lymphadenopathy:     Comments: No palpable cervical, supraclavicular, axillary or inguinal adenopathy   Skin:    General: Skin is warm and dry.  Neurological:     Mental Status: He is alert and oriented to person, place, and time.        CMP Latest Ref Rng & Units 06/06/2019  Glucose 65 - 99 mg/dL 158(H)  BUN 7 - 25 mg/dL 7  Creatinine 0.70 - 1.33 mg/dL 0.75  Sodium 135 - 146 mmol/L 138  Potassium 3.5 - 5.3 mmol/L 4.3  Chloride 98 - 110 mmol/L 103  CO2 20 - 32 mmol/L 28  Calcium 8.6 - 10.3 mg/dL 9.3  Total Protein 6.1 - 8.1 g/dL 6.0(L)  Total Bilirubin 0.2 - 1.2 mg/dL 1.1  Alkaline Phos 39 - 117 IU/L -  AST 10 - 35 U/L 11  ALT 9 - 46 U/L 15     CBC Latest Ref Rng & Units 06/06/2019  WBC 3.8 - 10.8 Thousand/uL 13.1(H)  Hemoglobin 13.2 - 17.1 g/dL 14.2  Hematocrit 38 - 50 % 42.1  Platelets 140 - 400 Thousand/uL 246     Assessment and plan- Patient is a 59 y.o. male referred for leukocytosis  Patient has had longstanding waxing and waning leukocytosis dating back to2017.  His white count Friday fluctuates between 11-20 differential has mainly showed neutrophilia and occasional lymphocytosis and monocytosis.  Platelet counts and hemoglobin is normal.  I suspect this is reactive secondary to his underlying medical problems.  Patient is seeing pulmonary for his cough symptoms GI for nausea  Today I will check a CBC with differential, flow cytometry, BCR ABL FISH testing and smear review.Patient does not warrant a bone marrow biopsy at this time.  Video visit in 2 weeks time  Thank you for this kind referral and the opportunity to participate in the care of this patient   Visit Diagnosis 1. Neutrophilia     Dr. Randa Evens, MD, MPH [facility] [phone] 03/10/2020

## 2020-03-13 LAB — GI PROFILE, STOOL, PCR

## 2020-03-13 LAB — COMP PANEL: LEUKEMIA/LYMPHOMA

## 2020-03-18 ENCOUNTER — Telehealth: Payer: Self-pay

## 2020-03-18 NOTE — Telephone Encounter (Signed)
-----   Message from Virgel Manifold, MD sent at 03/14/2020 11:05 AM EDT ----- Herb Grays please let the patient know, his stool testing was negative.  We need office visit note from [facility] GI from February 2021, if we have not obtained this, can you please request it

## 2020-03-18 NOTE — Telephone Encounter (Signed)
Called patient to let him know that his stool test was negative. Patient was also reminded to do the GI profile stool test next week. Patient stated that he would go ahead and bring the sample in next week. Patient had no further questions. I will request patient's last office not from [facility].

## 2020-03-21 ENCOUNTER — Inpatient Hospital Stay (HOSPITAL_BASED_OUTPATIENT_CLINIC_OR_DEPARTMENT_OTHER): Payer: BLUE CROSS/BLUE SHIELD | Admitting: Oncology

## 2020-03-21 DIAGNOSIS — D729 Disorder of white blood cells, unspecified: Secondary | ICD-10-CM

## 2020-03-21 LAB — BCR-ABL1 FISH
Cells Analyzed: 200
Cells Counted: 200

## 2020-03-23 NOTE — Progress Notes (Signed)
I connected with Scott Rosario on 03/23/20 at  1:00 PM EDT by video enabled telemedicine visit and verified that I am speaking with the correct person using two identifiers.   I discussed the limitations, risks, security and privacy concerns of performing an evaluation and management service by telemedicine and the availability of in-person appointments. I also discussed with the patient that there may be a patient responsible charge related to this service. The patient expressed understanding and agreed to proceed.  Other persons participating in the visit and their role in the encounter:  none  Patient's location:  work Provider's location:  work  Risk analyst Complaint:  Discuss results of bloodwork  History of present illness: Patient is a 59 year old male with a past medical history significant for hypertension, diabetes among other medical problems.  He has been referred to [place] for leukocytosis.  Most recent CBC from 02/26/2020 showed white cell count of 13, H&H of 15/43.7 and a platelet count of 209.  Differential mainly shows neutrophilia with an absolute neutrophil count of 9.7.  His recent labs also showed low B12 level of 274.  Looking back at his labs patient has had chronic leukocytosis with a white cell count that has fluctuated between 10-22.  He has had chronic neutrophilia with an [place] that fluctuates between 8-19.  Results of blood work from 03/10/2020 were as follows: CBC showed white count of 11.8, H&H of 14.1/40.9 with a platelet count of 215.  Differential showed neutrophilia with an ANC of 8.8 and some monocytosis within absolute monocyte count of 1.2.  Peripheral flow cytometry showed no significant immunophenotypic abnormality.  BCR ABL FISH was negative.  Smear review was unremarkable.  Interval history: Patient still reports ongoing fatigue as well as nausea and abdominal pain for which he is seeing GI.  Denies any new complaints at this time   Review of Systems  Constitutional:  Positive for malaise/fatigue. Negative for chills, fever and weight loss.  HENT: Negative for congestion, ear discharge and nosebleeds.   Eyes: Negative for blurred vision.  Respiratory: Negative for cough, hemoptysis, sputum production, shortness of breath and wheezing.   Cardiovascular: Negative for chest pain, palpitations, orthopnea and claudication.  Gastrointestinal: Positive for nausea. Negative for abdominal pain, blood in stool, constipation, diarrhea, heartburn, melena and vomiting.  Genitourinary: Negative for dysuria, flank pain, frequency, hematuria and urgency.  Musculoskeletal: Negative for back pain, joint pain and myalgias.  Skin: Negative for rash.  Neurological: Negative for dizziness, tingling, focal weakness, seizures, weakness and headaches.  Endo/Heme/Allergies: Does not bruise/bleed easily.  Psychiatric/Behavioral: Negative for depression and suicidal ideas. The patient does not have insomnia.     Allergies  Allergen Reactions  . Canagliflozin Other (See Comments)    adverse reactions: Fatigue and Zombie feeling   . Semaglutide Diarrhea and Nausea And Vomiting    Ozempic  Acid Reflux  . Tape     SURGICAL TAPE-RIPPED SKIN OFF    Past Medical History:  Diagnosis Date  . Acute calculous cholecystitis   . Alkaline phosphatase elevation   . Aortic aneurysm ([place]) 12/10/2018   3.4 cm March 2020  . Aortic atherosclerosis ([place])    on CT 02/25/14  . Asthma   . CAD (coronary artery disease)   . Cirrhosis ([place])   . Cirrhosis ([place])   . COPD (chronic obstructive pulmonary disease) ([place])   . Diabetes mellitus without complication ([place])   . Emphysema of lung ([place])   . Gallstone 12/10/2018  . Gastritis without bleeding   .  GERD (gastroesophageal reflux disease)   . Heart attack ([place])    2005, 2017 (stent after each)  . History of hiatal hernia    small  . History of kidney stones    h/o  . Hyperlipidemia   . Hypertension   . Ingrown toenail of both feet 11/29/2018   . Pneumonia 2018  . Pulmonary nodules    x 3, (11m, 549m 75m775mchest CT February 25, 2014  . Renal cyst    on CT 02/25/14  . Tobacco use     Past Surgical History:  Procedure Laterality Date  . APPENDECTOMY    . CARDIAC CATHETERIZATION Left 03/09/2016   Procedure: Left Heart Cath and Coronary Angiography;  Surgeon: BruCorey SkainsD;  [facility];  Service: Cardiovascular;  Laterality: Left;  . CARDIAC CATHETERIZATION N/A 03/09/2016   Procedure: Coronary Stent Intervention;  Surgeon: AleIsaias CowmanD;  [facility];  Service: Cardiovascular;  Laterality: N/A;  . CHOLECYSTECTOMY N/A 01/31/2019   Procedure: LAPAROSCOPIC CHOLECYSTECTOMY - DIABETIC;  Surgeon: DavVickie EpleyD;  Location: [facility];  Service: General;  Laterality: N/A;  . COLONOSCOPY WITH PROPOFOL N/A 03/22/2019   Procedure: COLONOSCOPY WITH PROPOFOL;  Surgeon: WohLucilla LameD;  [facility];  Laterality: N/A;  Diabetic - insulin and oral meds  . CORONARY ANGIOPLASTY WITH STENT PLACEMENT  10/25/2003   x 2  . ESOPHAGOGASTRODUODENOSCOPY (EGD) WITH PROPOFOL N/A 10/24/2017   Procedure: ESOPHAGOGASTRODUODENOSCOPY (EGD) WITH PROPOFOL;  Surgeon: WohLucilla LameD;  [facility];  Laterality: N/A;  Diabetic - oral meds  . POLYPECTOMY  03/22/2019   Procedure: POLYPECTOMY;  Surgeon: WohLucilla LameD;  [facility];;    Social History   Socioeconomic History  . Marital status: Married    Spouse name: Not on file  . Number of children: Not on file  . Years of education: Not on file  . Highest education level: Not on file  Occupational History  . Not on file  Tobacco Use  . Smoking status: Current Every Day Smoker    Packs/day: 1.00    Years: 45.00    Pack years: 45.00    Types: Cigarettes  . Smokeless tobacco: Never Used  . Tobacco comment: since age 8 34aping Use  .  Vaping Use: Never used  Substance and Sexual Activity  . Alcohol use: No    Alcohol/week: 0.0 standard drinks  . Drug use: No  . Sexual activity: Yes  Other Topics Concern  . Not on file  Social History Narrative   Active and independent at baseline   Social Determinants of Health   Financial Resource Strain:   . Difficulty of Paying Living Expenses:   Food Insecurity:   . Worried About RunCharity fundraiser the Last Year:   . RanArboriculturist the Last Year:   Transportation Needs:   . LacFilm/video editoredical):   . LMarland Kitchenck of Transportation (Non-Medical):   Physical Activity:   . Days of Exercise per Week:   . Minutes of Exercise per Session:   Stress:   . Feeling of Stress :   Social Connections:   . Frequency of Communication with Friends and Family:   . Frequency of Social Gatherings with Friends and Family:   . Attends Religious Services:   . Active Member of Clubs or Organizations:   . Attends Club or  Organization Meetings:   Marland Kitchen Marital Status:   Intimate Partner Violence:   . Fear of Current or Ex-Partner:   . Emotionally Abused:   Marland Kitchen Physically Abused:   . Sexually Abused:     Family History  Problem Relation Age of Onset  . Diabetes Mother   . Hypertension Mother   . Arthritis Mother   . Hyperlipidemia Mother   . Heart attack Father   . Epilepsy Father   . Diabetes Father   . Seizures Father   . Heart disease Father   . Hyperlipidemia Father   . Hypertension Father   . Stroke Father   . Diabetes Brother   . Hyperlipidemia Brother   . Hypertension Brother   . Cancer Maternal Grandmother        unknown  . Diabetes Paternal Grandmother   . Heart disease Paternal Grandmother   . Hyperlipidemia Paternal Grandmother   . Hypertension Paternal Grandmother   . Heart attack Paternal Grandfather      Current Outpatient Medications:  .  albuterol (PROVENTIL HFA;VENTOLIN HFA) 108 (90 Base) MCG/ACT inhaler, Inhale 2 puffs into the lungs every 4  (four) hours as needed for wheezing or shortness of breath., Disp: 1 Inhaler, Rfl: 1 .  ascorbic acid (VITAMIN C) 1000 MG tablet, Take 1 tablet by mouth 1 day or 1 dose., Disp: , Rfl:  .  aspirin EC 81 MG tablet, Take 81 mg by mouth daily., Disp: , Rfl:  .  atorvastatin (LIPITOR) 80 MG tablet, Take 1 tablet (80 mg total) by mouth at bedtime., Disp: 90 tablet, Rfl: 1 .  folic acid-pyridoxine-cyancobalamin (FOLTX) 2.5-25-2 MG TABS tablet, Take 1 tablet by mouth daily., Disp: , Rfl:  .  glipiZIDE (GLUCOTROL XL) 5 MG 24 hr tablet, Take 1 tablet (5 mg total) by mouth daily with breakfast. Hold if morning sugar <100 or if not eating during the day, Disp: 90 tablet, Rfl: 0 .  Insulin Glargine (LANTUS SOLOSTAR) 100 UNIT/ML Solostar Pen, Inject 12 Units into the skin at bedtime. (Patient taking differently: Inject 12-14 Units into the skin at bedtime. ), Disp: 15 mL, Rfl: 2 .  Insulin Pen Needle 32G X 4 MM MISC, Use as directed with insulin daily, Disp: 100 each, Rfl: 1 .  Lactobacillus Rhamnosus, GG, (RA PROBIOTIC DIGESTIVE CARE) CAPS, Take 1 capsule by mouth daily., Disp: , Rfl:  .  losartan (COZAAR) 50 MG tablet, Take 1 tablet (50 mg total) by mouth every evening., Disp: 90 tablet, Rfl: 1 .  metFORMIN (GLUCOPHAGE) 500 MG tablet, TAKE 2 TABLETS BY MOUTH TWICE DAILY WITHA MEAL, Disp: 180 tablet, Rfl: 2 .  metoprolol succinate (TOPROL-XL) 25 MG 24 hr tablet, Take 1 tablet (25 mg total) by mouth every morning., Disp: 90 tablet, Rfl: 1 .  nitroGLYCERIN (NITROSTAT) 0.4 MG SL tablet, Place 1 tablet (0.4 mg total) under the tongue every 5 (five) minutes as needed for chest pain. Max of 3 total; call 911, Disp: 25 tablet, Rfl: 5 .  pantoprazole (PROTONIX) 40 MG tablet, Take 1 tablet (40 mg total) by mouth 2 (two) times daily., Disp: 180 tablet, Rfl: 1 .  umeclidinium-vilanterol (ANORO ELLIPTA) 62.5-25 MCG/INH AEPB, Inhale 1 puff into the lungs daily., Disp: , Rfl:  .  baclofen (LIORESAL) 10 MG tablet, Take 1 tablet  by mouth as needed. (Patient not taking: Reported on 03/10/2020), Disp: , Rfl:  .  clopidogrel (PLAVIX) 75 MG tablet, Take 1 tablet by mouth daily. (Patient not taking: Reported on 03/21/2020), Disp: ,  Rfl:   No results found.  No images are attached to the encounter.   CMP Latest Ref Rng & Units 06/06/2019  Glucose 65 - 99 mg/dL 158(H)  BUN 7 - 25 mg/dL 7  Creatinine 0.70 - 1.33 mg/dL 0.75  Sodium 135 - 146 mmol/L 138  Potassium 3.5 - 5.3 mmol/L 4.3  Chloride 98 - 110 mmol/L 103  CO2 20 - 32 mmol/L 28  Calcium 8.6 - 10.3 mg/dL 9.3  Total Protein 6.1 - 8.1 g/dL 6.0(L)  Total Bilirubin 0.2 - 1.2 mg/dL 1.1  Alkaline Phos 39 - 117 IU/L -  AST 10 - 35 U/L 11  ALT 9 - 46 U/L 15   CBC Latest Ref Rng & Units 03/10/2020  WBC 4.0 - 10.5 K/uL 11.8(H)  Hemoglobin 13.0 - 17.0 g/dL 14.1  Hematocrit 39 - 52 % 40.9  Platelets 150 - 400 K/uL 215     Observation/objective: Appears in no acute distress on video visit today.  Breathing is nonlabored  Assessment and plan: Patient is a 59 year old male referred for leukocytosis/neutrophilia which I suspect is reactive   I discussed the results of blood work with the patient in detail.  Patient has had waxing and waning neutrophilia and a white count that fluctuates between 11-23 in the past.  Most recent white cell count was 11.8.  Differential has mainly shown neutrophilia with occasional monocytosis.  Flow cytometry as well as BCR ABL FISH testing is negative.  Suspicion for primary myeloproliferative disorder is low.  Patient does not require a bone marrow biopsy at this time.  I will see him back in 6 months with a CBC with diff  Follow-up instructions:as above  I discussed the assessment and treatment plan with the patient. The patient was provided an opportunity to ask questions and all were answered. The patient agreed with the plan and demonstrated an understanding of the instructions.   The patient was advised to call back or seek an  in-person evaluation if the symptoms worsen or if the condition fails to improve as anticipated.    Visit Diagnosis: 1. Neutrophilia     Dr. Randa Evens, MD, MPH [facility] Tel- [phone] 03/23/2020 11:13 AM

## 2020-03-25 ENCOUNTER — Telehealth: Payer: Self-pay

## 2020-03-25 NOTE — Telephone Encounter (Signed)
Medical records were requested from [facility].

## 2020-03-27 LAB — H. PYLORI BREATH TEST: H pylori Breath Test: NEGATIVE

## 2020-04-01 ENCOUNTER — Telehealth: Payer: Self-pay

## 2020-04-01 DIAGNOSIS — R110 Nausea: Secondary | ICD-10-CM

## 2020-04-01 DIAGNOSIS — R1013 Epigastric pain: Secondary | ICD-10-CM

## 2020-04-01 NOTE — Telephone Encounter (Signed)
Patient's wife-Wanda called wanting to know patient's MRI, H. Pylori breath test and lab results. Mariann Laster stated that her husband continues to have mid abdominal pain, nausea, fatigue and loss of appetite. She stated that she would like to know what could be the next step for her husband since he continues to feel bad. She understands that he has an appointment until next month, however, they are going out of town on Thursday 04/03/2020 and she was afraid that he would get to feeling worse while they were out of town. Mariann Laster wanted to know if there was something that her husband could do in the meantime. Patient medical records from [facility] have been requested.

## 2020-04-03 ENCOUNTER — Other Ambulatory Visit: Payer: Self-pay

## 2020-04-03 DIAGNOSIS — R1013 Epigastric pain: Secondary | ICD-10-CM

## 2020-04-03 DIAGNOSIS — R110 Nausea: Secondary | ICD-10-CM

## 2020-04-03 NOTE — Telephone Encounter (Signed)
Scott Manifold, MD  You 5 hours ago (12:17 PM)   H. pylori test was negative. I did not order any MRI? If he still continuing to have symptoms, I would recommend proceeding with EGD. If this is negative, colonoscopy to follow-up after that   Message text    Called patient's wife-Mrs. Scott Rosario and gave her Dr. Michele Mcalpine recommendations. She and the patient agreed on doing the EGD soon. They both agreed on 04/11/2020 at [facility] and to have his COVID-19 test done on 04/09/2020 at the [facility]. I told them that I would be mailing them the instructions since they were on their way to vacation and they will come back to town on 04/07/2020.

## 2020-04-09 ENCOUNTER — Other Ambulatory Visit
Admission: RE | Admit: 2020-04-09 | Discharge: 2020-04-09 | Disposition: A | Discharge status: Home / Self Care | Payer: BLUE CROSS/BLUE SHIELD | Source: Ambulatory Visit | Attending: Gastroenterology | Admitting: Gastroenterology

## 2020-04-09 ENCOUNTER — Other Ambulatory Visit: Payer: Self-pay

## 2020-04-09 DIAGNOSIS — Z01812 Encounter for preprocedural laboratory examination: Secondary | ICD-10-CM | POA: Diagnosis not present

## 2020-04-09 DIAGNOSIS — Z20822 Contact with and (suspected) exposure to covid-19: Secondary | ICD-10-CM | POA: Diagnosis not present

## 2020-04-09 LAB — SARS CORONAVIRUS 2 (TAT 6-24 HRS): SARS Coronavirus 2: NEGATIVE

## 2020-04-11 ENCOUNTER — Other Ambulatory Visit: Payer: Self-pay

## 2020-04-11 MED ORDER — NA SULFATE-K SULFATE-MG SULF 17.5-3.13-1.6 GM/177ML PO SOLN: ORAL | 0 refills | Status: DC

## 2020-04-29 ENCOUNTER — Inpatient Hospital Stay: Admission: RE | Admit: 2020-04-29 | Payer: BLUE CROSS/BLUE SHIELD | Source: Ambulatory Visit

## 2020-04-30 ENCOUNTER — Encounter: Payer: Self-pay | Admitting: Gastroenterology

## 2020-04-30 ENCOUNTER — Other Ambulatory Visit: Payer: Self-pay

## 2020-04-30 ENCOUNTER — Other Ambulatory Visit
Admission: RE | Admit: 2020-04-30 | Discharge: 2020-04-30 | Disposition: A | Discharge status: Home / Self Care | Payer: BLUE CROSS/BLUE SHIELD | Source: Ambulatory Visit | Attending: Gastroenterology | Admitting: Gastroenterology

## 2020-04-30 DIAGNOSIS — Z01812 Encounter for preprocedural laboratory examination: Secondary | ICD-10-CM | POA: Diagnosis not present

## 2020-04-30 DIAGNOSIS — Z20822 Contact with and (suspected) exposure to covid-19: Secondary | ICD-10-CM | POA: Diagnosis not present

## 2020-04-30 LAB — SARS CORONAVIRUS 2 (TAT 6-24 HRS): SARS Coronavirus 2: NEGATIVE

## 2020-05-01 ENCOUNTER — Ambulatory Visit
Admission: RE | Admit: 2020-05-01 | Discharge: 2020-05-01 | Disposition: A | Discharge status: Home / Self Care | Payer: BLUE CROSS/BLUE SHIELD | Attending: Gastroenterology | Admitting: Gastroenterology

## 2020-05-01 ENCOUNTER — Encounter: Payer: Self-pay | Admitting: Gastroenterology

## 2020-05-01 ENCOUNTER — Other Ambulatory Visit: Payer: Self-pay

## 2020-05-01 ENCOUNTER — Encounter
Admission: RE | Disposition: A | Discharge status: Home / Self Care | Payer: Self-pay | Source: Home / Self Care | Attending: Gastroenterology

## 2020-05-01 ENCOUNTER — Ambulatory Visit: Payer: BLUE CROSS/BLUE SHIELD | Admitting: Anesthesiology

## 2020-05-01 DIAGNOSIS — E785 Hyperlipidemia, unspecified: Secondary | ICD-10-CM | POA: Diagnosis not present

## 2020-05-01 DIAGNOSIS — R110 Nausea: Secondary | ICD-10-CM | POA: Diagnosis not present

## 2020-05-01 DIAGNOSIS — Z79899 Other long term (current) drug therapy: Secondary | ICD-10-CM | POA: Diagnosis not present

## 2020-05-01 DIAGNOSIS — K3189 Other diseases of stomach and duodenum: Secondary | ICD-10-CM | POA: Insufficient documentation

## 2020-05-01 DIAGNOSIS — Z794 Long term (current) use of insulin: Secondary | ICD-10-CM | POA: Insufficient documentation

## 2020-05-01 DIAGNOSIS — I719 Aortic aneurysm of unspecified site, without rupture: Secondary | ICD-10-CM | POA: Insufficient documentation

## 2020-05-01 DIAGNOSIS — K2950 Unspecified chronic gastritis without bleeding: Secondary | ICD-10-CM | POA: Insufficient documentation

## 2020-05-01 DIAGNOSIS — Z7902 Long term (current) use of antithrombotics/antiplatelets: Secondary | ICD-10-CM | POA: Insufficient documentation

## 2020-05-01 DIAGNOSIS — Z955 Presence of coronary angioplasty implant and graft: Secondary | ICD-10-CM | POA: Insufficient documentation

## 2020-05-01 DIAGNOSIS — R1013 Epigastric pain: Secondary | ICD-10-CM

## 2020-05-01 DIAGNOSIS — R112 Nausea with vomiting, unspecified: Secondary | ICD-10-CM | POA: Insufficient documentation

## 2020-05-01 DIAGNOSIS — Z7982 Long term (current) use of aspirin: Secondary | ICD-10-CM | POA: Insufficient documentation

## 2020-05-01 DIAGNOSIS — I2510 Atherosclerotic heart disease of native coronary artery without angina pectoris: Secondary | ICD-10-CM | POA: Insufficient documentation

## 2020-05-01 DIAGNOSIS — I10 Essential (primary) hypertension: Secondary | ICD-10-CM | POA: Insufficient documentation

## 2020-05-01 DIAGNOSIS — I252 Old myocardial infarction: Secondary | ICD-10-CM | POA: Insufficient documentation

## 2020-05-01 DIAGNOSIS — F17210 Nicotine dependence, cigarettes, uncomplicated: Secondary | ICD-10-CM | POA: Insufficient documentation

## 2020-05-01 DIAGNOSIS — J439 Emphysema, unspecified: Secondary | ICD-10-CM | POA: Diagnosis not present

## 2020-05-01 DIAGNOSIS — K219 Gastro-esophageal reflux disease without esophagitis: Secondary | ICD-10-CM | POA: Diagnosis not present

## 2020-05-01 DIAGNOSIS — B9681 Helicobacter pylori [H. pylori] as the cause of diseases classified elsewhere: Secondary | ICD-10-CM | POA: Insufficient documentation

## 2020-05-01 DIAGNOSIS — K253 Acute gastric ulcer without hemorrhage or perforation: Secondary | ICD-10-CM

## 2020-05-01 DIAGNOSIS — K209 Esophagitis, unspecified without bleeding: Secondary | ICD-10-CM | POA: Diagnosis not present

## 2020-05-01 DIAGNOSIS — K7460 Unspecified cirrhosis of liver: Secondary | ICD-10-CM | POA: Insufficient documentation

## 2020-05-01 DIAGNOSIS — E119 Type 2 diabetes mellitus without complications: Secondary | ICD-10-CM | POA: Insufficient documentation

## 2020-05-01 DIAGNOSIS — I700 Atherosclerosis of aorta: Secondary | ICD-10-CM | POA: Insufficient documentation

## 2020-05-01 HISTORY — PX: ESOPHAGOGASTRODUODENOSCOPY (EGD) WITH PROPOFOL: SHX5813

## 2020-05-01 LAB — GLUCOSE, CAPILLARY: Glucose-Capillary: 130 mg/dL — ABNORMAL HIGH (ref 70–99)

## 2020-05-01 SURGERY — ESOPHAGOGASTRODUODENOSCOPY (EGD) WITH PROPOFOL
Anesthesia: General

## 2020-05-01 MED ORDER — LIDOCAINE HCL (PF) 2 % IJ SOLN
INTRAMUSCULAR | Status: AC
  Filled 2020-05-01: qty 5

## 2020-05-01 MED ORDER — PROPOFOL 500 MG/50ML IV EMUL
INTRAVENOUS | Status: DC | PRN
  Administered 2020-05-01: 200 ug/kg/min via INTRAVENOUS

## 2020-05-01 MED ORDER — LIDOCAINE 2% (20 MG/ML) 5 ML SYRINGE
INTRAMUSCULAR | Status: DC | PRN
  Administered 2020-05-01: 100 mg via INTRAVENOUS

## 2020-05-01 MED ORDER — SODIUM CHLORIDE 0.9 % IV SOLN: INTRAVENOUS | Status: DC

## 2020-05-01 MED ORDER — GLYCOPYRROLATE 0.2 MG/ML IJ SOLN
INTRAMUSCULAR | Status: AC
  Filled 2020-05-01: qty 1

## 2020-05-01 MED ORDER — PROPOFOL 10 MG/ML IV BOLUS
INTRAVENOUS | Status: DC | PRN
  Administered 2020-05-01: 80 mg via INTRAVENOUS

## 2020-05-01 MED ORDER — PANTOPRAZOLE SODIUM 40 MG PO TBEC: 40.0000 mg | DELAYED_RELEASE_TABLET | Freq: Two times a day (BID) | ORAL | 0 refills | Status: DC

## 2020-05-01 MED ORDER — GLYCOPYRROLATE 0.2 MG/ML IJ SOLN
INTRAMUSCULAR | Status: DC | PRN
  Administered 2020-05-01: .2 mg via INTRAVENOUS

## 2020-05-01 MED ORDER — PROPOFOL 10 MG/ML IV BOLUS
INTRAVENOUS | Status: AC
  Filled 2020-05-01: qty 20

## 2020-05-01 NOTE — Transfer of Care (Signed)
Immediate Anesthesia Transfer of Care Note  Patient: Scott Rosario  Procedure(s) Performed: ESOPHAGOGASTRODUODENOSCOPY (EGD) WITH PROPOFOL (N/A )  Patient Location: Endoscopy Unit  Anesthesia Type:General  Level of Consciousness: sedated  Airway & Oxygen Therapy: Patient connected to nasal cannula oxygen  Post-op Assessment: Post -op Vital signs reviewed and stable  Post vital signs: stable  Last Vitals:  Vitals Value Taken Time  BP 107/73 05/01/20 1112  Temp 36 C 05/01/20 1111  Pulse 66 05/01/20 1116  Resp 25 05/01/20 1116  SpO2 94 % 05/01/20 1116  Vitals shown include unvalidated device data.  Last Pain:  Vitals:   05/01/20 1111  TempSrc: Temporal  PainSc: Asleep         Complications: No complications documented.

## 2020-05-01 NOTE — Op Note (Signed)
[facility] Patient Name: Scott Rosario Procedure Date: 05/01/2020 10:50 AM MRN: [id number] Account #: [id number] Date of Birth: [birth year] Admit Type: Outpatient Age: 59 Room: [facility] ENDO ROOM 1 Gender: Male Note Status: Finalized Procedure:             Upper GI endoscopy Indications:           Nausea with vomiting Providers:             Bob Eastwood B. Bonna Gains MD, MD Referring MD:          [name] (Referring MD) Medicines:             Monitored Anesthesia Care Complications:         No immediate complications. Procedure:             Pre-Anesthesia Assessment:                        - Prior to the procedure, a History and Physical was                         performed, and patient medications, allergies and                         sensitivities were reviewed. The patient's tolerance                         of previous anesthesia was reviewed.                        - The risks and benefits of the procedure and the                         sedation options and risks were discussed with the                         patient. All questions were answered and informed                         consent was obtained.                        - Patient identification and proposed procedure were                         verified prior to the procedure by the physician, the                         nurse, the anesthesiologist, the anesthetist and the                         technician. The procedure was verified in the                         procedure room.                        - ASA Grade Assessment: II - A patient with mild                         systemic disease.  After obtaining informed consent, the endoscope was                         passed under direct vision. Throughout the procedure,                         the patient's blood pressure, pulse, and oxygen                         saturations were monitored continuously. The  Endoscope                         was introduced through the mouth, and advanced to the                         third part of duodenum. The upper GI endoscopy was                         accomplished with ease. The patient tolerated the                         procedure well. Findings:      White nummular lesions were noted in the distal esophagus. Biopsies were       obtained from the proximal and distal esophagus with cold forceps for       histology of suspected eosinophilic esophagitis.      The exam of the esophagus was otherwise normal.      Two non-bleeding superficial gastric ulcers were found in the gastric       fundus and in the gastric antrum. The largest lesion was 4 mm in largest       dimension.      Patchy mildly erythematous mucosa without bleeding was found in the       gastric antrum. Biopsies were taken with a cold forceps for histology.       Biopsies were obtained in the gastric body, at the incisura and in the       gastric antrum with cold forceps for histology.      A few 3 mm erosions with no stigmata of recent bleeding were found in       the gastric fundus and in the gastric antrum.      Patchy mildly erythematous mucosa without active bleeding and with no       stigmata of bleeding was found in the duodenal bulb.      The exam of the duodenum was otherwise normal. Impression:            - White nummular lesions in esophageal mucosa.                         Biopsied.                        - Non-bleeding gastric ulcers.                        - Erythematous mucosa in the antrum. Biopsied.                        - Erosive gastropathy with no stigmata of recent  bleeding.                        - Erythematous duodenopathy.                        - Biopsies were obtained in the gastric body, at the                         incisura and in the gastric antrum. Recommendation:        - Await pathology results.                        -  Discharge patient to home (with escort).                        - Advance diet as tolerated.                        - Continue present medications.                        - Patient has a contact number available for                         emergencies. The signs and symptoms of potential                         delayed complications were discussed with the patient.                         Return to normal activities tomorrow. Written                         discharge instructions were provided to the patient.                        - Discharge patient to home (with escort).                        - The findings and recommendations were discussed with                         the patient.                        - The findings and recommendations were discussed with                         the patient's family.                        - Avoid NSAIDs except Aspirin if medically indicated                         by PCP                        - Take prescribed proton pump inhibitor or H2 blocker                         (  antacid) medications 30 - 60 minutes before meals. Procedure Code(s):     --- Professional ---                        [phone], Esophagogastroduodenoscopy, flexible,                         transoral; with biopsy, single or multiple Diagnosis Code(s):     --- Professional ---                        K22.8, Other specified diseases of esophagus                        K25.9, Gastric ulcer, unspecified as acute or chronic,                         without hemorrhage or perforation                        K31.89, Other diseases of stomach and duodenum                        R11.2, Nausea with vomiting, unspecified CPT copyright 2019 American Medical Association. All rights reserved. The codes documented in this report are preliminary and upon coder review may  be revised to meet current compliance requirements.  [name], MD Margretta Sidle B. Bonna Gains MD, MD 05/01/2020 11:20:28  AM This report has been signed electronically. Number of Addenda: 0 Note Initiated On: 05/01/2020 10:50 AM Estimated Blood Loss:  Estimated blood loss: none.      [facility]

## 2020-05-01 NOTE — H&P (Signed)
[name], MD [address], [place], [place], [place], [address] [address], [place], [place], [place], [address] Phone: [phone]  Fax: [phone]  Primary Care Physician:  [name], MD   Pre-Procedure History & Physical: HPI:  Scott Rosario is a 59 y.o. male is here for an EGD.   Past Medical History:  Diagnosis Date  . Acute calculous cholecystitis   . Alkaline phosphatase elevation   . Aortic aneurysm ([facility]) 12/10/2018   3.4 cm March 2020  . Aortic atherosclerosis ([facility])    on CT 02/25/14  . Asthma   . CAD (coronary artery disease)   . Cirrhosis ([facility])   . Cirrhosis ([facility])   . COPD (chronic obstructive pulmonary disease) ([facility])   . Diabetes mellitus without complication ([facility])   . Emphysema of lung ([facility])   . Gallstone 12/10/2018  . Gastritis without bleeding   . GERD (gastroesophageal reflux disease)   . Heart attack ([facility])    2005, 2017 (stent after each)  . History of hiatal hernia    small  . History of kidney stones    h/o  . Hyperlipidemia   . Hypertension   . Ingrown toenail of both feet 11/29/2018  . Pneumonia 2018  . Pulmonary nodules    x 3, (78mm, 73mm, 58mm) chest CT February 25, 2014  . Renal cyst    on CT 02/25/14  . Tobacco use     Past Surgical History:  Procedure Laterality Date  . APPENDECTOMY    . CARDIAC CATHETERIZATION Left 03/09/2016   Procedure: Left Heart Cath and Coronary Angiography;  Surgeon: Corey Skains, MD;  Location: [facility];  Service: Cardiovascular;  Laterality: Left;  . CARDIAC CATHETERIZATION N/A 03/09/2016   Procedure: Coronary Stent Intervention;  Surgeon: Isaias Cowman, MD;  Location: [facility];  Service: Cardiovascular;  Laterality: N/A;  . CHOLECYSTECTOMY N/A 01/31/2019   Procedure: LAPAROSCOPIC CHOLECYSTECTOMY - DIABETIC;  Surgeon: Vickie Epley, MD;  Location: [facility];  Service: General;  Laterality: N/A;  . COLONOSCOPY WITH PROPOFOL N/A 03/22/2019   Procedure: COLONOSCOPY  WITH PROPOFOL;  Surgeon: Lucilla Lame, MD;  Location: [facility];  Service: Endoscopy;  Laterality: N/A;  Diabetic - insulin and oral meds  . CORONARY ANGIOPLASTY WITH STENT PLACEMENT  10/25/2003   x 2  . ESOPHAGOGASTRODUODENOSCOPY (EGD) WITH PROPOFOL N/A 10/24/2017   Procedure: ESOPHAGOGASTRODUODENOSCOPY (EGD) WITH PROPOFOL;  Surgeon: Lucilla Lame, MD;  Location: [facility];  Service: Endoscopy;  Laterality: N/A;  Diabetic - oral meds  . POLYPECTOMY  03/22/2019   Procedure: POLYPECTOMY;  Surgeon: Lucilla Lame, MD;  Location: [facility];  Service: Endoscopy;;    Prior to Admission medications   Medication Sig Start Date End Date Taking? Authorizing Provider  albuterol (PROVENTIL HFA;VENTOLIN HFA) 108 (90 Base) MCG/ACT inhaler Inhale 2 puffs into the lungs every 4 (four) hours as needed for wheezing or shortness of breath. 12/21/18  Yes Poulose, Bethel Born, NP  ascorbic acid (VITAMIN C) 1000 MG tablet Take 1 tablet by mouth 1 day or 1 dose.   Yes [provider]  aspirin EC 81 MG tablet Take 81 mg by mouth daily.   Yes [provider]  atorvastatin (LIPITOR) 80 MG tablet Take 1 tablet (80 mg total) by mouth at bedtime. 01/23/20  Yes Delsa Grana, PA-C  glipiZIDE (GLUCOTROL XL) 5 MG 24 hr tablet Take 1 tablet (5 mg total) by mouth daily with breakfast. Hold if morning sugar <100  or if not eating during the day 07/04/19  Yes Delsa Grana, PA-C  Insulin Glargine (LANTUS SOLOSTAR) 100 UNIT/ML Solostar Pen Inject 12 Units into the skin at bedtime. Patient taking differently: Inject 12-14 Units into the skin at bedtime.  07/25/18  Yes Poulose, Bethel Born, NP  losartan (COZAAR) 50 MG tablet Take 1 tablet (50 mg total) by mouth every evening. 03/05/19  Yes Poulose, Bethel Born, NP  metFORMIN (GLUCOPHAGE) 500 MG tablet TAKE 2 TABLETS BY MOUTH TWICE DAILY WITHA MEAL 11/13/19  Yes Hubbard Hartshorn, FNP  metoprolol succinate (TOPROL-XL) 25 MG 24 hr tablet Take 1 tablet (25  mg total) by mouth every morning. 03/05/19  Yes Poulose, Bethel Born, NP  clopidogrel (PLAVIX) 75 MG tablet Take 1 tablet by mouth daily. Patient not taking: Reported on 03/21/2020    [provider]  folic acid-pyridoxine-cyancobalamin (FOLTX) 2.5-25-2 MG TABS tablet Take 1 tablet by mouth daily. Patient not taking: Reported on 05/01/2020    [provider]  Insulin Pen Needle 32G X 4 MM MISC Use as directed with insulin daily 06/06/19   Delsa Grana, PA-C  Lactobacillus Rhamnosus, GG, (RA PROBIOTIC DIGESTIVE CARE) CAPS Take 1 capsule by mouth daily. Patient not taking: Reported on 05/01/2020    [provider]  Na Sulfate-K Sulfate-Mg Sulf 17.5-3.13-1.6 GM/177ML SOLN At 5 PM the day before procedure take 1 bottle and 5 hours before procedure take 1 bottle. 04/11/20   Virgel Manifold, MD  nitroGLYCERIN (NITROSTAT) 0.4 MG SL tablet Place 1 tablet (0.4 mg total) under the tongue every 5 (five) minutes as needed for chest pain. Max of 3 total; call 911 09/22/17   [name], Satira Anis, MD  pantoprazole (PROTONIX) 40 MG tablet Take 1 tablet (40 mg total) by mouth 2 (two) times daily. Patient not taking: Reported on 05/01/2020 03/05/19   Fredderick Severance, NP  umeclidinium-vilanterol (ANORO ELLIPTA) 62.5-25 MCG/INH AEPB Inhale 1 puff into the lungs daily. Patient not taking: Reported on 05/01/2020 02/27/20   [provider]    Allergies as of 04/04/2020 - Review Complete 03/21/2020  Allergen Reaction Noted  . Canagliflozin Other (See Comments) 01/25/2017  . Semaglutide Diarrhea and Nausea And Vomiting 10/19/2017  . Tape  01/25/2019    Family History  Problem Relation Age of Onset  . Diabetes Mother   . Hypertension Mother   . Arthritis Mother   . Hyperlipidemia Mother   . Heart attack Father   . Epilepsy Father   . Diabetes Father   . Seizures Father   . Heart disease Father   . Hyperlipidemia Father   . Hypertension Father   . Stroke Father   . Diabetes Brother    . Hyperlipidemia Brother   . Hypertension Brother   . Cancer Maternal Grandmother        unknown  . Diabetes Paternal Grandmother   . Heart disease Paternal Grandmother   . Hyperlipidemia Paternal Grandmother   . Hypertension Paternal Grandmother   . Heart attack Paternal Grandfather     Social History   Socioeconomic History  . Marital status: Married    Spouse name: Not on file  . Number of children: Not on file  . Years of education: Not on file  . Highest education level: Not on file  Occupational History  . Not on file  Tobacco Use  . Smoking status: Current Every Day Smoker    Packs/day: 1.00    Years: 45.00    Pack years: 45.00  Types: Cigarettes  . Smokeless tobacco: Never Used  . Tobacco comment: since age 85  Vaping Use  . Vaping Use: Never used  Substance and Sexual Activity  . Alcohol use: No    Alcohol/week: 0.0 standard drinks  . Drug use: No  . Sexual activity: Yes  Other Topics Concern  . Not on file  Social History Narrative   Active and independent at baseline   Social Determinants of Health   Financial Resource Strain:   . Difficulty of Paying Living Expenses:   Food Insecurity:   . Worried About Charity fundraiser in the Last Year:   . Arboriculturist in the Last Year:   Transportation Needs:   . Film/video editor (Medical):   Marland Kitchen Lack of Transportation (Non-Medical):   Physical Activity:   . Days of Exercise per Week:   . Minutes of Exercise per Session:   Stress:   . Feeling of Stress :   Social Connections:   . Frequency of Communication with Friends and Family:   . Frequency of Social Gatherings with Friends and Family:   . Attends Religious Services:   . Active Member of Clubs or Organizations:   . Attends Archivist Meetings:   Marland Kitchen Marital Status:   Intimate Partner Violence:   . Fear of Current or Ex-Partner:   . Emotionally Abused:   Marland Kitchen Physically Abused:   . Sexually Abused:     Review of Systems: See  HPI, otherwise negative ROS  Physical Exam: BP (!) 159/69   Pulse 60   Temp (!) 97.1 F (36.2 C) (Temporal)   Resp 20   Ht 5\' 8"  (1.727 m)   Wt 83 kg   SpO2 100%   BMI 27.83 kg/m  General:   Alert,  pleasant and cooperative in NAD Head:  Normocephalic and atraumatic. Neck:  Supple; no masses or thyromegaly. Lungs:  Clear throughout to auscultation, normal respiratory effort.    Heart:  +S1, +S2, Regular rate and rhythm, No edema. Abdomen:  Soft, nontender and nondistended. Normal bowel sounds, without guarding, and without rebound.   Neurologic:  Alert and  oriented x4;  grossly normal neurologically.  Impression/Plan: Scott Rosario is here for an EGD for abdominal pain  Risks, benefits, limitations, and alternatives regarding the procedure have been reviewed with the patient.  Questions have been answered.  All parties agreeable.   Virgel Manifold, MD  05/01/2020, 9:50 AM

## 2020-05-01 NOTE — Anesthesia Preprocedure Evaluation (Signed)
Anesthesia Evaluation  Patient identified by MRN, date of birth, ID band Patient awake    Reviewed: Allergy & Precautions, NPO status , Patient's Chart, lab work & pertinent test results  History of Anesthesia Complications Negative for: history of anesthetic complications  Airway Mallampati: I  TM Distance: >3 FB Neck ROM: Full    Dental  (+) Poor Dentition, Missing   Pulmonary neg shortness of breath, asthma , COPD,  COPD inhaler, neg recent URI, Current Smoker and Patient abstained from smoking.,    breath sounds clear to auscultation- rhonchi (-) wheezing      Cardiovascular hypertension, Pt. on medications (-) angina+ CAD, + Past MI and + Cardiac Stents  (-) dysrhythmias (-) Valvular Problems/Murmurs Rhythm:Regular Rate:Normal - Systolic murmurs and - Diastolic murmurs    Neuro/Psych neg Seizures negative neurological ROS  negative psych ROS   GI/Hepatic hiatal hernia, GERD  ,(+) Cirrhosis       ,   Endo/Other  diabetes, Insulin Dependent  Renal/GU Renal disease (kidney stones)     Musculoskeletal negative musculoskeletal ROS (+)   Abdominal (+) - obese,   Peds  Hematology negative hematology ROS (+)   Anesthesia Other Findings Past Medical History: No date: Alkaline phosphatase elevation 12/10/2018: Aortic aneurysm ([facility])     Comment:  3.4 cm March 2020 No date: Aortic atherosclerosis ([facility])     Comment:  on CT 02/25/14 No date: Asthma No date: CAD (coronary artery disease) No date: Cirrhosis ([facility]) No date: COPD (chronic obstructive pulmonary disease) ([facility]) No date: Diabetes mellitus without complication ([facility]) No date: Emphysema of lung ([facility]) No date: GERD (gastroesophageal reflux disease) No date: Heart attack [facility])     Comment:  2005, 2017 (stent after each) No date: History of hiatal hernia     Comment:  small No date: History of kidney stones     Comment:  h/o No date: Hyperlipidemia No date:  Hypertension 2018: Pneumonia No date: Pulmonary nodules     Comment:  x 3, (47mm, 72mm, 26mm) chest CT February 25, 2014 No date: Renal cyst     Comment:  on CT 02/25/14 No date: Tobacco use   Reproductive/Obstetrics                             Anesthesia Physical  Anesthesia Plan  ASA: III  Anesthesia Plan: General   Post-op Pain Management:    Induction: Intravenous  PONV Risk Score and Plan: 0 and Propofol infusion and TIVA  Airway Management Planned: Natural Airway and Nasal Cannula  Additional Equipment:   Intra-op Plan:   Post-operative Plan:   Informed Consent: I have reviewed the patients History and Physical, chart, labs and discussed the procedure including the risks, benefits and alternatives for the proposed anesthesia with the patient or authorized representative who has indicated his/her understanding and acceptance.     Dental advisory given  Plan Discussed with: CRNA and Anesthesiologist  Anesthesia Plan Comments:         Anesthesia Quick Evaluation

## 2020-05-01 NOTE — Anesthesia Postprocedure Evaluation (Signed)
Anesthesia Post Note  Patient: VALIN MASSIE  Procedure(s) Performed: ESOPHAGOGASTRODUODENOSCOPY (EGD) WITH PROPOFOL (N/A )  Patient location during evaluation: Endoscopy Anesthesia Type: General Level of consciousness: awake and alert Pain management: pain level controlled Vital Signs Assessment: post-procedure vital signs reviewed and stable Respiratory status: spontaneous breathing, nonlabored ventilation, respiratory function stable and patient connected to nasal cannula oxygen Cardiovascular status: blood pressure returned to baseline and stable Postop Assessment: no apparent nausea or vomiting Anesthetic complications: no   No complications documented.   Last Vitals:  Vitals:   05/01/20 1121 05/01/20 1131  BP: 106/72 130/74  Pulse: 63 67  Resp: (!) 24 (!) 21  Temp:    SpO2: 94% 97%    Last Pain:  Vitals:   05/01/20 1131  TempSrc:   PainSc: 0-No pain                 Martha Clan

## 2020-05-02 ENCOUNTER — Encounter: Payer: Self-pay | Admitting: Gastroenterology

## 2020-05-02 LAB — SURGICAL PATHOLOGY

## 2020-05-05 ENCOUNTER — Telehealth: Payer: Self-pay | Admitting: Gastroenterology

## 2020-05-05 ENCOUNTER — Telehealth (INDEPENDENT_AMBULATORY_CARE_PROVIDER_SITE_OTHER): Payer: BLUE CROSS/BLUE SHIELD | Admitting: Gastroenterology

## 2020-05-05 ENCOUNTER — Other Ambulatory Visit: Payer: Self-pay

## 2020-05-05 ENCOUNTER — Encounter: Payer: Self-pay | Admitting: Gastroenterology

## 2020-05-05 DIAGNOSIS — A048 Other specified bacterial intestinal infections: Secondary | ICD-10-CM | POA: Diagnosis not present

## 2020-05-05 MED ORDER — METRONIDAZOLE 500 MG PO TABS
500.0000 mg | ORAL_TABLET | Freq: Four times a day (QID) | ORAL | 0 refills | Status: AC
Start: 2020-05-05 — End: 2020-05-19

## 2020-05-05 MED ORDER — DOXYCYCLINE MONOHYDRATE 150 MG PO TABS: 150.0000 mg | ORAL_TABLET | Freq: Two times a day (BID) | ORAL | 0 refills | Status: AC

## 2020-05-05 MED ORDER — PEPTO-BISMOL 524 MG/30ML PO SUSP: 30.0000 mL | Freq: Four times a day (QID) | ORAL | 0 refills | Status: AC | PRN

## 2020-05-05 NOTE — Telephone Encounter (Signed)
LVM to call our office for a 6 wk f/u.

## 2020-05-05 NOTE — Progress Notes (Signed)
Scott Antigua, MD [address]  [place]  [place], [place] [address]  Main: [phone]  Fax: [phone]   Primary Care Physician: Scott Hartigan, MD  Virtual Visit via Telephone Note  I connected with patient on 05/05/20 at  9:30 AM EDT by telephone and verified that I am speaking with the correct person using two identifiers.   I discussed the limitations, risks, security and privacy concerns of performing an evaluation and management service by telephone and the availability of in person appointments. I also discussed with the patient that there may be a patient responsible charge related to this service. The patient expressed understanding and agreed to proceed.  Location of Patient: Home Location of Provider: Home Persons involved: Patient and provider only during the visit (nursing staff and front desk staff was involved in communicating with the patient prior to the appointment, reviewing medications and checking them in)   History of Present Illness: Chief Complaint  Patient presents with  . Abdominal Pain    Patient stated that he had been feeling better.  . Diarrhea     HPI: Scott Rosario is a 59 y.o. male here for follow-up of abdominal pain and nausea.  Patient underwent EGD which showed small gastric ulcers and gastric erythema.  Biopsies show H. pylori even though his H. pylori breath test recently was negative.  Patient has had a history of H. pylori in 2019 and it appears he was treated with triple therapy at that time.  Patient reports feeling better but still continues to have intermittent abdominal pain.  No nausea vomiting or weight loss  Current Outpatient Medications  Medication Sig Dispense Refill  . albuterol (PROVENTIL HFA;VENTOLIN HFA) 108 (90 Base) MCG/ACT inhaler Inhale 2 puffs into the lungs every 4 (four) hours as needed for wheezing or shortness of breath. 1 Inhaler 1  . ascorbic acid (VITAMIN C) 1000 MG tablet Take 1 tablet by  mouth 1 day or 1 dose.    Marland Kitchen aspirin EC 81 MG tablet Take 81 mg by mouth daily.    Marland Kitchen atorvastatin (LIPITOR) 80 MG tablet Take 1 tablet (80 mg total) by mouth at bedtime. 90 tablet 1  . baclofen (LIORESAL) 10 MG tablet Take 10 mg by mouth daily.    . clopidogrel (PLAVIX) 75 MG tablet Take 1 tablet by mouth daily.     Marland Kitchen glipiZIDE (GLUCOTROL XL) 5 MG 24 hr tablet Take 1 tablet (5 mg total) by mouth daily with breakfast. Hold if morning sugar <100 or if not eating during the day 90 tablet 0  . Insulin Glargine (LANTUS SOLOSTAR) 100 UNIT/ML Solostar Pen Inject 12 Units into the skin at bedtime. (Patient taking differently: Inject 12-14 Units into the skin at bedtime. ) 15 mL 2  . Insulin Pen Needle 32G X 4 MM MISC Use as directed with insulin daily 100 each 1  . losartan (COZAAR) 50 MG tablet Take 1 tablet (50 mg total) by mouth every evening. 90 tablet 1  . metFORMIN (GLUCOPHAGE) 500 MG tablet TAKE 2 TABLETS BY MOUTH TWICE DAILY WITHA MEAL 180 tablet 2  . metoprolol succinate (TOPROL-XL) 25 MG 24 hr tablet Take 1 tablet (25 mg total) by mouth every morning. 90 tablet 1  . pantoprazole (PROTONIX) 40 MG tablet Take 1 tablet (40 mg total) by mouth 2 (two) times daily. 120 tablet 0  . bismuth subsalicylate (PEPTO-BISMOL) 262 MG/15ML suspension Take 30 mLs by mouth every 6 (six) hours as needed for  up to 14 days. 360 mL 0  . doxycycline (ADOXA) 150 MG tablet Take 1 tablet (150 mg total) by mouth 2 (two) times daily for 14 days. 28 tablet 0  . folic acid-pyridoxine-cyancobalamin (FOLTX) 2.5-25-2 MG TABS tablet Take 1 tablet by mouth daily. (Patient not taking: Reported on 05/05/2020)    . Lactobacillus Rhamnosus, GG, (RA PROBIOTIC DIGESTIVE CARE) CAPS Take 1 capsule by mouth daily. (Patient not taking: Reported on 05/01/2020)    . metroNIDAZOLE (FLAGYL) 500 MG tablet Take 1 tablet (500 mg total) by mouth 4 (four) times daily for 14 days. 56 tablet 0  . nitroGLYCERIN (NITROSTAT) 0.4 MG SL tablet Place 1 tablet (0.4  mg total) under the tongue every 5 (five) minutes as needed for chest pain. Max of 3 total; call 911 (Patient not taking: Reported on 05/05/2020) 25 tablet 5  . umeclidinium-vilanterol (ANORO ELLIPTA) 62.5-25 MCG/INH AEPB Inhale 1 puff into the lungs daily. (Patient not taking: Reported on 05/01/2020)     No current facility-administered medications for this visit.    Allergies as of 05/05/2020 - Review Complete 05/05/2020  Allergen Reaction Noted  . Canagliflozin Other (See Comments) 01/25/2017  . Semaglutide Diarrhea and Nausea And Vomiting 10/19/2017  . Tape  01/25/2019    Review of Systems:    All systems reviewed and negative except where noted in HPI.   Observations/Objective:  Labs: CMP     Component Value Date/Time   NA 138 06/06/2019 0000   NA 138 02/17/2016 0940   K 4.3 06/06/2019 0000   CL 103 06/06/2019 0000   CO2 28 06/06/2019 0000   GLUCOSE 158 (H) 06/06/2019 0000   BUN 7 06/06/2019 0000   BUN 14 02/17/2016 0940   CREATININE 0.75 06/06/2019 0000   CALCIUM 9.3 06/06/2019 0000   PROT 6.0 (L) 06/06/2019 0000   PROT 6.2 02/28/2019 1013   ALBUMIN 4.3 02/28/2019 1013   AST 11 06/06/2019 0000   ALT 15 06/06/2019 0000   ALKPHOS 148 (H) 02/28/2019 1013   BILITOT 1.1 06/06/2019 0000   BILITOT 0.9 02/28/2019 1013   GFRNONAA 100 06/06/2019 0000   GFRAA 116 06/06/2019 0000   Lab Results  Component Value Date   WBC 11.8 (H) 03/10/2020   HGB 14.1 03/10/2020   HCT 40.9 03/10/2020   MCV 85.9 03/10/2020   PLT 215 03/10/2020    Imaging Studies: No results found.  Assessment and Plan:   Scott Rosario is a 59 y.o. y/o male here for follow-up of abdominal pain and nausea with EGD and biopsy showing H. pylori  Assessment and Plan: Treatment for H. pylori with quadruple therapy indicated at this time as he has had triple therapy in the past  After he completes his therapy it would be important to see if his symptoms are better before ordering eradication testing  given that his H. pylori breath test off PPI, was recently negative but biopsies did show H. pylori infection  For eradication testing, we would need to carefully make sure he is not on any PPI and order appropriate testing as necessary to confirm eradication.  Repeat upper endoscopy may need to be considered if symptoms continue despite quadruple therapy  Follow Up Instructions:    I discussed the assessment and treatment plan with the patient. The patient was provided an opportunity to ask questions and all were answered. The patient agreed with the plan and demonstrated an understanding of the instructions.   The patient was advised to call back or  seek an in-person evaluation if the symptoms worsen or if the condition fails to improve as anticipated.  I provided 12 minutes of non-face-to-face time during this encounter. Additional time was spent in reviewing patient's chart, placing orders etc.   Virgel Manifold, MD  Speech recognition software was used to dictate this note.

## 2020-05-08 IMAGING — US US BIOPSY CORE LIVER
1 series · 10 of 10 positions shown · non-contrast
Comparison: none

INDICATION: 56-year-old with elevated liver enzymes and request for random liver
biopsy.

[Series 1: us biopsy core liver · 10 of 10 slices shown]
[im 1/10]
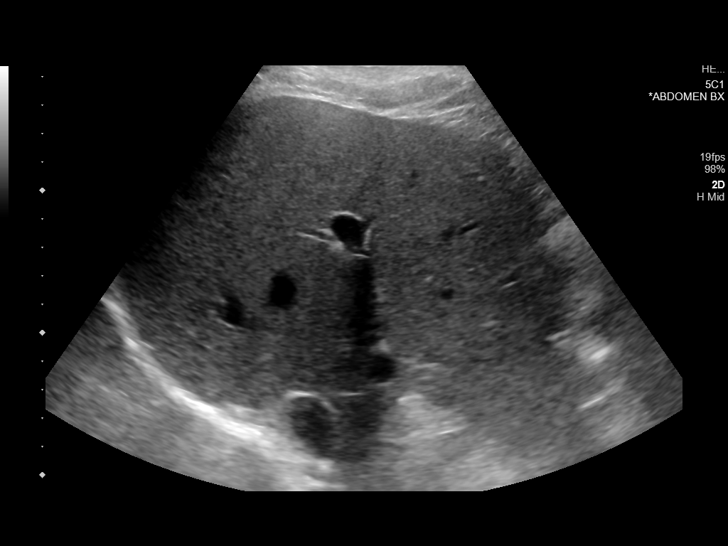
[im 2/10]
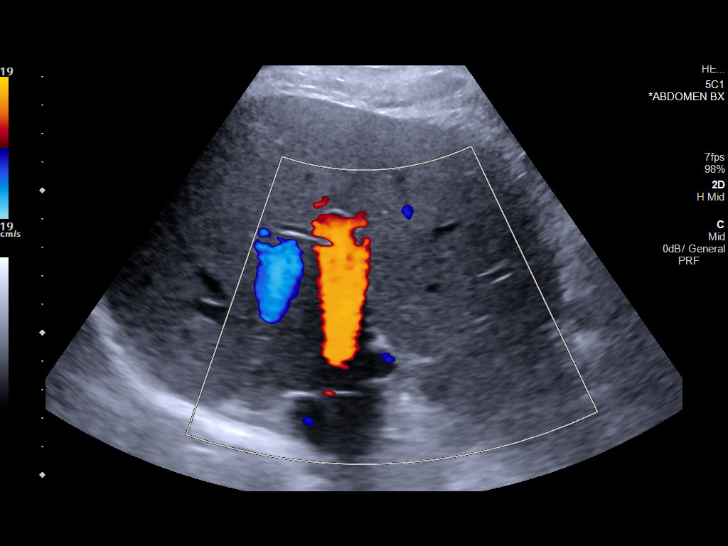
[im 3/10]
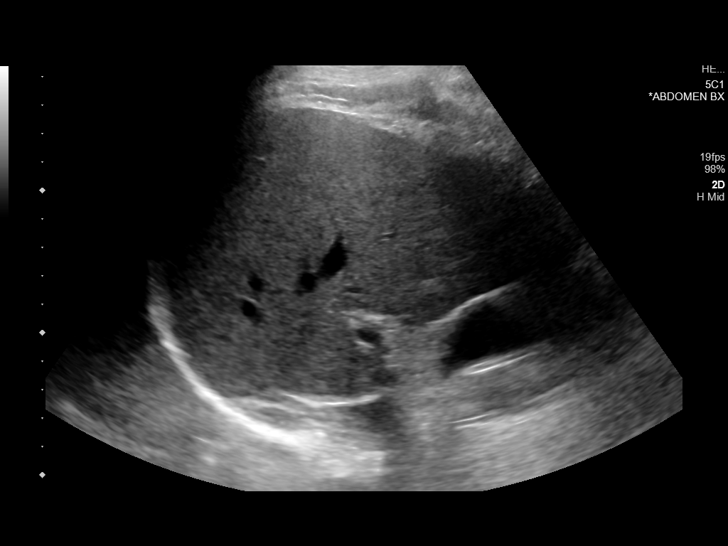
[im 4/10]
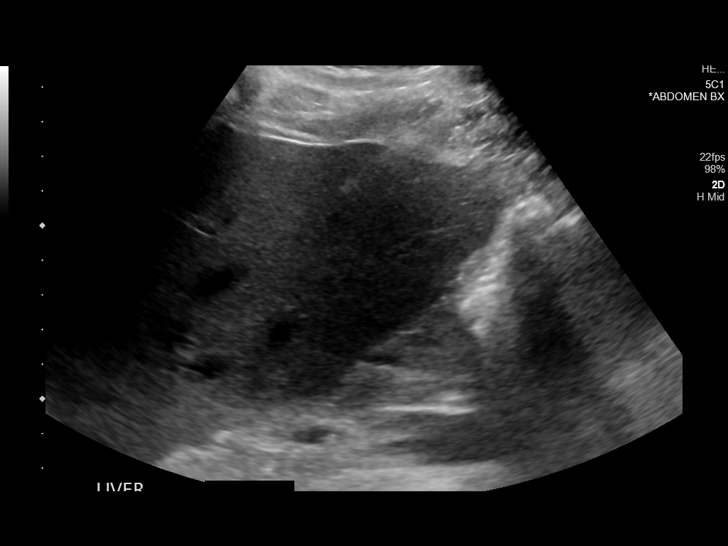
[im 5/10]
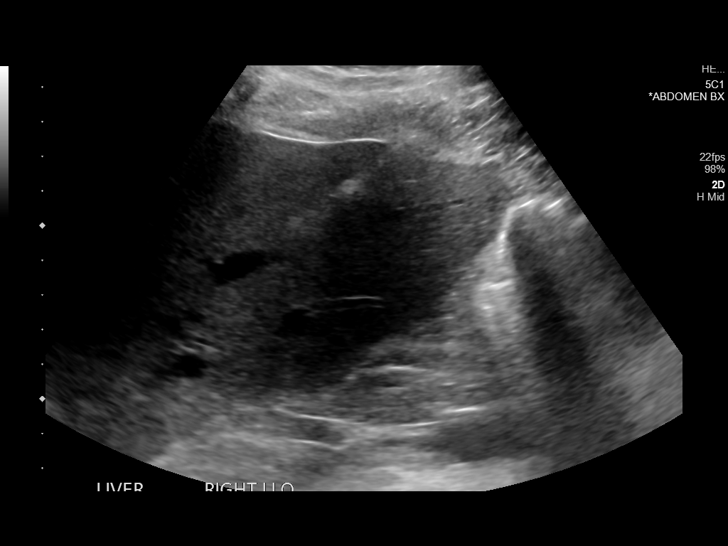
[im 6/10]
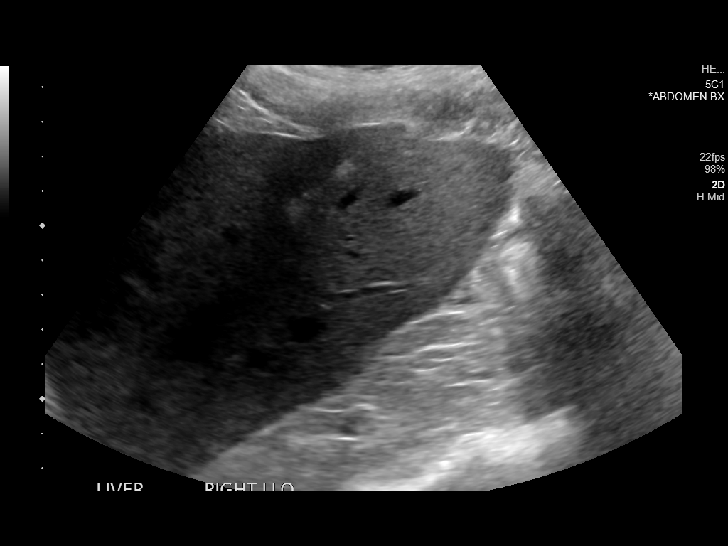
[im 7/10]
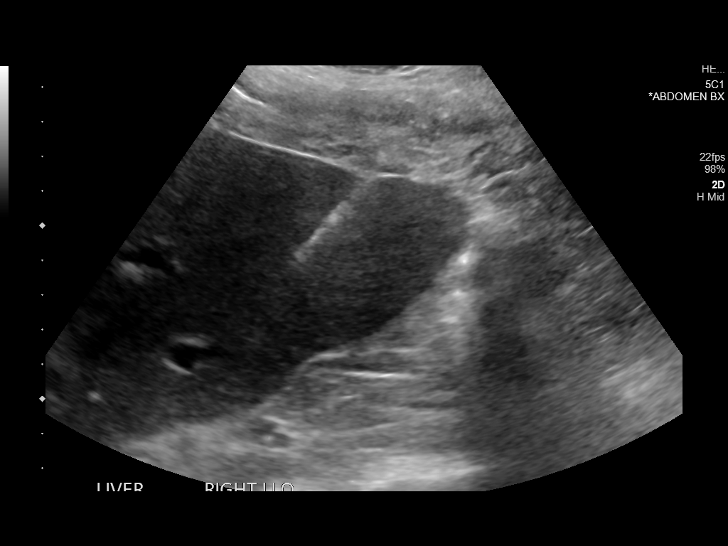
[im 8/10]
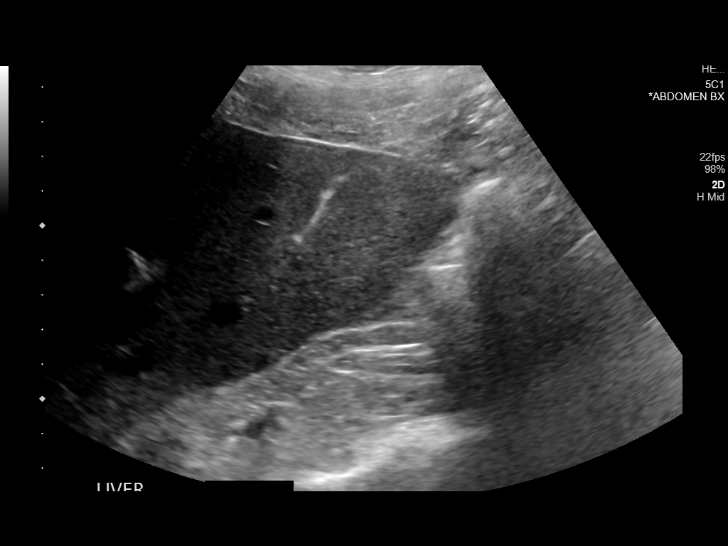
[im 9/10]
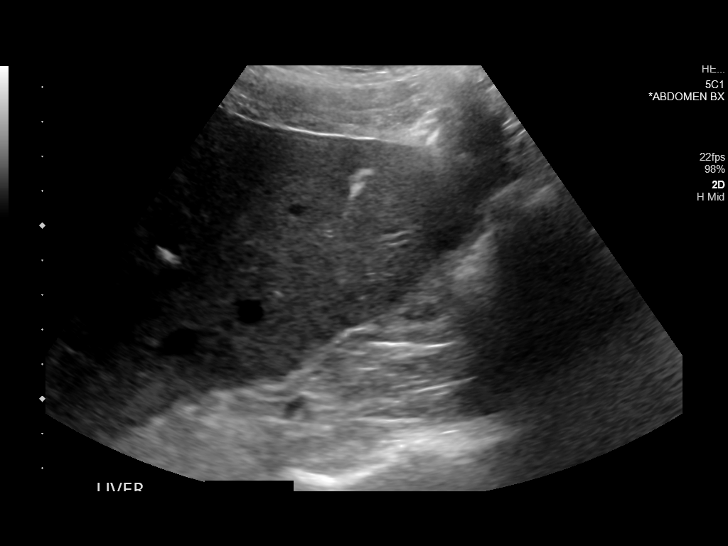
[im 10/10]
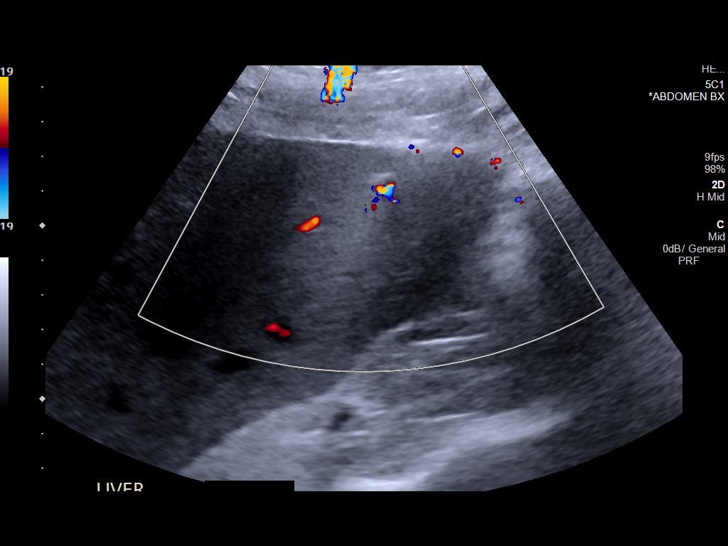

[10 of 10 positions shown; findings below may reference images not displayed]

EXAM:
ULTRASOUND-GUIDED RANDOM LIVER BIOPSY

MEDICATIONS:
None.

ANESTHESIA/SEDATION:
Moderate (conscious) sedation was employed during this procedure. A
total of Versed 1 mg and Fentanyl 50 mcg was administered
intravenously.

Moderate Sedation Time: 16 minutes. The patient's level of
consciousness and vital signs were monitored continuously by
radiology nursing throughout the procedure under my direct
supervision.

FLUOROSCOPY TIME:  None

COMPLICATIONS:
None immediate.

PROCEDURE:
Informed written consent was obtained from the patient after a
thorough discussion of the procedural risks, benefits and
alternatives. All questions were addressed. Maximal Sterile Barrier
Technique was utilized including caps, mask, sterile gowns, sterile
gloves, sterile drape, hand hygiene and skin antiseptic. A timeout
was performed prior to the initiation of the procedure.

Liver was evaluated with ultrasound. The right hepatic lobe was
selected for biopsy. Right abdomen was prepped with chlorhexidine
and sterile field was created. Liver extended below the subcostal
region. The skin was anesthetized with 1% lidocaine. 17 gauge
coaxial needle directed into the right hepatic lobe with real-time
ultrasound guidance. A total of 3 core biopsies were obtained from
the right hepatic lobe with an 18 gauge core device. Adequate
specimens were obtained and placed in formalin. The 17 gauge needle
was removed as Gel-Foam slurry was slowly injected. Needle was
removed without complication. Bandage placed over the puncture site.
FINDINGS: Core biopsies obtained from the right hepatic lobe. Adequate
specimens obtained. No evidence for bleeding or hematoma formation.
IMPRESSION: Successful ultrasound-guided core biopsies of the right hepatic
lobe.

## 2020-05-22 ENCOUNTER — Telehealth: Payer: Self-pay

## 2020-05-22 NOTE — Telephone Encounter (Signed)
Patient's wife-Scott Rosario called wanting to know if her husband was able to get the COVID-19 vaccine with him taking antibiotics for his H Pylori. I told her that I would have to message Dr. Bonna Gains and that I would call her back with a response.  I then asked Dr. Bonna Gains by secure chat and she stated that it was okay for the patient to get his COVID-19 vaccine even though he is taking antibiotics. Then I called Mrs. Scott Rosario and told her that [name] could get his vaccine. She agreed and had no further questions.

## 2020-06-10 ENCOUNTER — Other Ambulatory Visit: Payer: Self-pay

## 2020-06-10 DIAGNOSIS — Z8619 Personal history of other infectious and parasitic diseases: Secondary | ICD-10-CM

## 2020-06-12 ENCOUNTER — Other Ambulatory Visit: Payer: Self-pay | Admitting: Family Medicine

## 2020-06-12 LAB — H. PYLORI BREATH TEST: H pylori Breath Test: NEGATIVE

## 2020-06-13 ENCOUNTER — Other Ambulatory Visit: Payer: Self-pay

## 2020-06-16 ENCOUNTER — Encounter: Payer: Self-pay | Admitting: Gastroenterology

## 2020-06-16 ENCOUNTER — Telehealth (INDEPENDENT_AMBULATORY_CARE_PROVIDER_SITE_OTHER): Payer: BLUE CROSS/BLUE SHIELD | Admitting: Gastroenterology

## 2020-06-16 ENCOUNTER — Other Ambulatory Visit: Payer: Self-pay

## 2020-06-16 DIAGNOSIS — R109 Unspecified abdominal pain: Secondary | ICD-10-CM

## 2020-06-16 DIAGNOSIS — Z8619 Personal history of other infectious and parasitic diseases: Secondary | ICD-10-CM | POA: Diagnosis not present

## 2020-06-16 NOTE — Progress Notes (Signed)
Scott Antigua, MD [address]  [place]  [place], [place] [address]  Main: [phone]  Fax: [phone]   Primary Care Physician: [name], MD  Virtual Visit via Telephone Note  I connected with patient on 06/16/20 at  8:30 AM EDT by telephone and verified that I am speaking with the correct person using two identifiers.   I discussed the limitations, risks, security and privacy concerns of performing an evaluation and management service by telephone and the availability of in person appointments. I also discussed with the patient that there may be a patient responsible charge related to this service. The patient expressed understanding and agreed to proceed.  Location of Patient: Home Location of Provider: Home Persons involved: Patient and provider only during the visit (nursing staff and front desk staff was involved in communicating with the patient prior to the appointment, reviewing medications and checking them in)   History of Present Illness: Chief Complaint  Patient presents with  . History of H pylori     HPI: Scott Rosario is a 59 y.o. male here for follow-up of H. pylori.  Patient completed quadruple therapy and did H. pylori eradication testing which was negative.  However, today patient tells me that he was taking his Protonix when he took the H. pylori breath test.  His abdominal symptoms are very intermittent, states some days he has no symptoms, but then will have abdominal discomfort again.  His diarrhea is the same as well, some days with no diarrhea, other days with 1-2 loose bowel movements.  No blood in stool.  No nausea or vomiting.  Current Outpatient Medications  Medication Sig Dispense Refill  . albuterol (PROVENTIL HFA;VENTOLIN HFA) 108 (90 Base) MCG/ACT inhaler Inhale 2 puffs into the lungs every 4 (four) hours as needed for wheezing or shortness of breath. 1 Inhaler 1  . ascorbic acid (VITAMIN C) 1000 MG tablet Take 1 tablet  by mouth 1 day or 1 dose.    Marland Kitchen aspirin EC 81 MG tablet Take 81 mg by mouth daily.    Marland Kitchen atorvastatin (LIPITOR) 80 MG tablet Take 1 tablet (80 mg total) by mouth at bedtime. 90 tablet 1  . folic acid-pyridoxine-cyancobalamin (FOLTX) 2.5-25-2 MG TABS tablet Take 1 tablet by mouth daily.     Marland Kitchen glipiZIDE (GLUCOTROL XL) 5 MG 24 hr tablet Take 1 tablet (5 mg total) by mouth daily with breakfast. Hold if morning sugar <100 or if not eating during the day 90 tablet 0  . Insulin Glargine (LANTUS SOLOSTAR) 100 UNIT/ML Solostar Pen Inject 12 Units into the skin at bedtime. (Patient taking differently: Inject 12-14 Units into the skin at bedtime. ) 15 mL 2  . Insulin Pen Needle 32G X 4 MM MISC Use as directed with insulin daily 100 each 1  . Lactobacillus Rhamnosus, GG, (RA PROBIOTIC DIGESTIVE CARE) CAPS Take 1 capsule by mouth daily.     Marland Kitchen losartan (COZAAR) 50 MG tablet Take 1 tablet (50 mg total) by mouth every evening. 90 tablet 1  . metFORMIN (GLUCOPHAGE) 500 MG tablet TAKE 2 TABLETS BY MOUTH TWICE DAILY WITHA MEAL 180 tablet 2  . metoprolol succinate (TOPROL-XL) 25 MG 24 hr tablet Take 1 tablet (25 mg total) by mouth every morning. 90 tablet 1  . pantoprazole (PROTONIX) 40 MG tablet Take 1 tablet (40 mg total) by mouth 2 (two) times daily. 120 tablet 0  . umeclidinium-vilanterol (ANORO ELLIPTA) 62.5-25 MCG/INH AEPB Inhale 1 puff into the  lungs daily.     Marland Kitchen amoxicillin (AMOXIL) 500 MG capsule Take 1 capsule by mouth in the morning and at bedtime. (Patient not taking: Reported on 06/16/2020)    . nitroGLYCERIN (NITROSTAT) 0.4 MG SL tablet Place 1 tablet (0.4 mg total) under the tongue every 5 (five) minutes as needed for chest pain. Max of 3 total; call 911 (Patient not taking: Reported on 06/16/2020) 25 tablet 5   No current facility-administered medications for this visit.    Allergies as of 06/16/2020 - Review Complete 06/16/2020  Allergen Reaction Noted  . Canagliflozin Other (See Comments) 01/25/2017   . Semaglutide Diarrhea and Nausea And Vomiting 10/19/2017  . Tape  01/25/2019    Review of Systems:    All systems reviewed and negative except where noted in HPI.   Observations/Objective:  Labs: CMP     Component Value Date/Time   NA 138 06/06/2019 0000   NA 138 02/17/2016 0940   K 4.3 06/06/2019 0000   CL 103 06/06/2019 0000   CO2 28 06/06/2019 0000   GLUCOSE 158 (H) 06/06/2019 0000   BUN 7 06/06/2019 0000   BUN 14 02/17/2016 0940   CREATININE 0.75 06/06/2019 0000   CALCIUM 9.3 06/06/2019 0000   PROT 6.0 (L) 06/06/2019 0000   PROT 6.2 02/28/2019 1013   ALBUMIN 4.3 02/28/2019 1013   AST 11 06/06/2019 0000   ALT 15 06/06/2019 0000   ALKPHOS 148 (H) 02/28/2019 1013   BILITOT 1.1 06/06/2019 0000   BILITOT 0.9 02/28/2019 1013   GFRNONAA 100 06/06/2019 0000   GFRAA 116 06/06/2019 0000   Lab Results  Component Value Date   WBC 11.8 (H) 03/10/2020   HGB 14.1 03/10/2020   HCT 40.9 03/10/2020   MCV 85.9 03/10/2020   PLT 215 03/10/2020    Imaging Studies: No results found.  Assessment and Plan:   Scott Rosario is a 59 y.o. y/o male here for follow-up of H. pylori  Assessment and Plan: Eradication testing can be false negative as patient states he was still taking his Protonix on the day that he got the eradication testing done  He has been advised to stop the Protonix, and he verbalized understanding.  Obtain eradication testing again in 2 weeks  We also did discuss the option of repeat upper endoscopy given that his H. pylori breath test was previously negative on June 29 when he had symptoms, but biopsies did show H. pylori bacteria.  However, at this time he only wants to proceed with repeat H. pylori eradication testing with breath test.  Consider repeat upper endoscopy with possible colonoscopy if symptoms continue  Follow Up Instructions:    I discussed the assessment and treatment plan with the patient. The patient was provided an opportunity to  ask questions and all were answered. The patient agreed with the plan and demonstrated an understanding of the instructions.   The patient was advised to call back or seek an in-person evaluation if the symptoms worsen or if the condition fails to improve as anticipated.  I provided 12 minutes of non-face-to-face time during this encounter. Additional time was spent in reviewing patient's chart, placing orders etc.   Virgel Manifold, MD  Speech recognition software was used to dictate this note.

## 2020-06-17 ENCOUNTER — Ambulatory Visit: Payer: BLUE CROSS/BLUE SHIELD | Admitting: Gastroenterology

## 2020-07-24 LAB — H. PYLORI BREATH TEST: H pylori Breath Test: POSITIVE — AB

## 2020-07-29 ENCOUNTER — Other Ambulatory Visit: Payer: Self-pay | Admitting: Gastroenterology

## 2020-07-29 DIAGNOSIS — K219 Gastro-esophageal reflux disease without esophagitis: Secondary | ICD-10-CM

## 2020-07-29 MED ORDER — AMOXICILLIN 500 MG PO TABS: 1000.0000 mg | ORAL_TABLET | Freq: Two times a day (BID) | ORAL | 0 refills | Status: AC

## 2020-07-29 MED ORDER — PANTOPRAZOLE SODIUM 40 MG PO TBEC: 40.0000 mg | DELAYED_RELEASE_TABLET | Freq: Two times a day (BID) | ORAL | 0 refills | Status: DC

## 2020-07-29 MED ORDER — LEVOFLOXACIN 500 MG PO TABS: 500.0000 mg | ORAL_TABLET | Freq: Every day | ORAL | 0 refills | Status: AC

## 2020-07-30 ENCOUNTER — Telehealth: Payer: Self-pay

## 2020-07-30 DIAGNOSIS — Z8619 Personal history of other infectious and parasitic diseases: Secondary | ICD-10-CM

## 2020-07-30 NOTE — Telephone Encounter (Signed)
Called patient but had to leave him a detailed message with the information below. I also told him that I would be sending him a message on MyChart with the information.

## 2020-07-30 NOTE — Addendum Note (Signed)
Addended by: Wayna Chalet on: 07/30/2020 09:29 AM   Modules accepted: Orders

## 2020-07-30 NOTE — Telephone Encounter (Signed)
-----   Message from Virgel Manifold, MD sent at 07/29/2020  3:01 PM EDT ----- Scott Rosario please let the patient know, his blood test was positive for H. pylori.  We need to retreat this with different antibiotics.  I have sent 2 antibiotics and Protonix to his pharmacy for 14 days.  While he is taking these antibiotics, please tell him to be carefully monitoring his blood sugars.  1 of these medications can increase the effect of glipizide, and increase the risk of low blood sugars.  Therefore, if he has any symptoms of hypoglycemia, to test his blood sugar immediately.  After he finishes these medications, the plan would be to repeat H. pylori breath test in 4 weeks after completion of both the antibiotics and the Protonix (which means 6 weeks after starting the medications).  Please order.

## 2020-09-16 ENCOUNTER — Telehealth: Payer: Self-pay | Admitting: Gastroenterology

## 2020-09-16 NOTE — Telephone Encounter (Signed)
Mrs. Honor called back wanting to know if physician had any thoughts on what to do with husband.

## 2020-09-16 NOTE — Telephone Encounter (Signed)
Spoke with patient's wife-Wanda and she stated that he is needing to get his H pylori test done next week but they will be going out of town for the holidays. Therefore, he will go and get tested until 09/30/2020 at the [facility] location. Then she agreed on scheduling him follow up appointment until 10/06/2020 at the [facility] location.

## 2020-09-16 NOTE — Telephone Encounter (Signed)
Patients wife states antibiotics for Hpylori are not seeming to help and pt is still having a lot of symptoms daily. Pt wife wanting advice on what to try to help pt. Please advise and let pt know.

## 2020-09-17 NOTE — Telephone Encounter (Signed)
Patient wife wanting to know if Dr. Bonna Gains can order some lab work for pt to have on 1.4.22 when he has Hpylori test.

## 2020-09-17 NOTE — Telephone Encounter (Signed)
Called patient's wife back and I asked her what exactly is she wanting for the physician to test for and she stated that Scott Rosario have been acting "off" lately. She stated that he have been tired, fatigued, moody and confused for a while and would like to know why. She stated that she would like for him to have his thyroid checked with other basic labs. Please advise.

## 2020-09-17 NOTE — Telephone Encounter (Signed)
Called patient's wife-Wanda to let her know that Dr. Bonna Gains is reommending for him to go to his PCP for his symptoms and labs that she is wanting. Mrs. Mariann Laster understood and had no further questions.

## 2020-09-22 ENCOUNTER — Inpatient Hospital Stay: Payer: BLUE CROSS/BLUE SHIELD | Attending: Oncology

## 2020-09-23 ENCOUNTER — Telehealth: Payer: Self-pay | Admitting: Oncology

## 2020-09-23 ENCOUNTER — Other Ambulatory Visit: Payer: Self-pay

## 2020-09-23 ENCOUNTER — Inpatient Hospital Stay: Payer: BLUE CROSS/BLUE SHIELD | Admitting: Oncology

## 2020-09-23 NOTE — Progress Notes (Unsigned)
I connected with Scott Rosario on 09/23/20 at  2:15 PM EST by {Blank single:19197::"video enabled telemedicine visit","telephone visit"} and verified that I am speaking with the correct person using two identifiers.   I discussed the limitations, risks, security and privacy concerns of performing an evaluation and management service by telemedicine and the availability of in-person appointments. I also discussed with the patient that there may be a patient responsible charge related to this service. The patient expressed understanding and agreed to proceed.  Other persons participating in the visit and their role in the encounter:  ***  Patient's location:  *** Provider's location:  ***  Chief Complaint: Routine follow-up of neutrophilia  History of present illness: Patient is a 59 year old male with a past medical history significant for hypertension, diabetes among other medical problems. He has been referred to [place] for leukocytosis. Most recent CBC from 02/26/2020 showed white cell count of 13, H&H of 15/43.7 and a platelet count of 209. Differential mainly shows neutrophilia with an absolute neutrophil count of 9.7. His recent labs also showed low B12 level of 274. Looking back at his labs patient has had chronic leukocytosiswith a white cell count that has fluctuated between 10-22. He has had chronic neutrophilia with an Arp that fluctuates between 8-19.  Results of blood work from 03/10/2020 were as follows: CBC showed white count of 11.8, H&H of 14.1/40.9 with a platelet count of 215.  Differential showed neutrophilia with an ANC of 8.8 and some monocytosis within absolute monocyte count of 1.2.  Peripheral flow cytometry showed no significant immunophenotypic abnormality.  BCR ABL FISH was negative.  Smear review was unremarkable.   Interval history ***   ROS  Allergies  Allergen Reactions  . Canagliflozin Other (See Comments)    adverse reactions: Fatigue and Zombie feeling   .  Semaglutide Diarrhea and Nausea And Vomiting    Ozempic  Acid Reflux  . Tape     SURGICAL TAPE-RIPPED SKIN OFF    Past Medical History:  Diagnosis Date  . Acute calculous cholecystitis   . Alkaline phosphatase elevation   . Aortic aneurysm ([place]) 12/10/2018   3.4 cm March 2020  . Aortic atherosclerosis ([place])    on CT 02/25/14  . Asthma   . CAD (coronary artery disease)   . Cirrhosis ([place])   . Cirrhosis ([place])   . COPD (chronic obstructive pulmonary disease) ([place])   . Diabetes mellitus without complication ([place])   . Emphysema of lung ([place])   . Gallstone 12/10/2018  . Gastritis without bleeding   . GERD (gastroesophageal reflux disease)   . Heart attack ([place])    2005, 2017 (stent after each)  . History of hiatal hernia    small  . History of kidney stones    h/o  . Hyperlipidemia   . Hypertension   . Ingrown toenail of both feet 11/29/2018  . Pneumonia 2018  . Pulmonary nodules    x 3, (7mm, 39mm, 69mm) chest CT February 25, 2014  . Renal cyst    on CT 02/25/14  . Tobacco use     Past Surgical History:  Procedure Laterality Date  . APPENDECTOMY    . CARDIAC CATHETERIZATION Left 03/09/2016   Procedure: Left Heart Cath and Coronary Angiography;  Surgeon: Corey Skains, MD;  Location: [facility];  Service: Cardiovascular;  Laterality: Left;  . CARDIAC CATHETERIZATION N/A 03/09/2016   Procedure: Coronary Stent Intervention;  Surgeon: Isaias Cowman, MD;  Location: [place] [facility];  Service: Cardiovascular;  Laterality: N/A;  . CHOLECYSTECTOMY N/A 01/31/2019   Procedure: LAPAROSCOPIC CHOLECYSTECTOMY - DIABETIC;  Surgeon: Vickie Epley, MD;  Location: [facility];  Service: General;  Laterality: N/A;  . COLONOSCOPY WITH PROPOFOL N/A 03/22/2019   Procedure: COLONOSCOPY WITH PROPOFOL;  Surgeon: Lucilla Lame, MD;  Location: [place];  Service: Endoscopy;  Laterality: N/A;  Diabetic - insulin and oral meds  . CORONARY ANGIOPLASTY WITH STENT PLACEMENT  10/25/2003    x 2  . ESOPHAGOGASTRODUODENOSCOPY (EGD) WITH PROPOFOL N/A 10/24/2017   Procedure: ESOPHAGOGASTRODUODENOSCOPY (EGD) WITH PROPOFOL;  Surgeon: Lucilla Lame, MD;  Location: [facility];  Service: Endoscopy;  Laterality: N/A;  Diabetic - oral meds  . ESOPHAGOGASTRODUODENOSCOPY (EGD) WITH PROPOFOL N/A 05/01/2020   Procedure: ESOPHAGOGASTRODUODENOSCOPY (EGD) WITH PROPOFOL;  Surgeon: Virgel Manifold, MD;  Location: [facility];  Service: Endoscopy;  Laterality: N/A;  . POLYPECTOMY  03/22/2019   Procedure: POLYPECTOMY;  Surgeon: Lucilla Lame, MD;  Location: [facility];  Service: Endoscopy;;    Social History   Socioeconomic History  . Marital status: Married    Spouse name: Not on file  . Number of children: Not on file  . Years of education: Not on file  . Highest education level: Not on file  Occupational History  . Not on file  Tobacco Use  . Smoking status: Current Every Day Smoker    Packs/day: 1.00    Years: 45.00    Pack years: 45.00    Types: Cigarettes  . Smokeless tobacco: Never Used  . Tobacco comment: since age 43  Vaping Use  . Vaping Use: Never used  Substance and Sexual Activity  . Alcohol use: No    Alcohol/week: 0.0 standard drinks  . Drug use: No  . Sexual activity: Yes  Other Topics Concern  . Not on file  Social History Narrative   Active and independent at baseline   Social Determinants of Health   Financial Resource Strain: Not on file  Food Insecurity: Not on file  Transportation Needs: Not on file  Physical Activity: Not on file  Stress: Not on file  Social Connections: Not on file  Intimate Partner Violence: Not on file    Family History  Problem Relation Age of Onset  . Diabetes Mother   . Hypertension Mother   . Arthritis Mother   . Hyperlipidemia Mother   . Heart attack Father   . Epilepsy Father   . Diabetes Father   . Seizures Father   . Heart disease Father   . Hyperlipidemia Father   . Hypertension Father    . Stroke Father   . Diabetes Brother   . Hyperlipidemia Brother   . Hypertension Brother   . Cancer Maternal Grandmother        unknown  . Diabetes Paternal Grandmother   . Heart disease Paternal Grandmother   . Hyperlipidemia Paternal Grandmother   . Hypertension Paternal Grandmother   . Heart attack Paternal Grandfather      Current Outpatient Medications:  .  albuterol (PROVENTIL HFA;VENTOLIN HFA) 108 (90 Base) MCG/ACT inhaler, Inhale 2 puffs into the lungs every 4 (four) hours as needed for wheezing or shortness of breath., Disp: 1 Inhaler, Rfl: 1 .  ascorbic acid (VITAMIN C) 1000 MG tablet, Take 1 tablet by mouth 1 day or 1 dose., Disp: , Rfl:  .  aspirin EC 81 MG tablet, Take 81 mg by mouth daily., Disp: , Rfl:  .  atorvastatin (LIPITOR) 80 MG tablet,  Take 1 tablet (80 mg total) by mouth at bedtime., Disp: 90 tablet, Rfl: 1 .  folic acid-pyridoxine-cyancobalamin (FOLTX) 2.5-25-2 MG TABS tablet, Take 1 tablet by mouth daily. , Disp: , Rfl:  .  glipiZIDE (GLUCOTROL XL) 5 MG 24 hr tablet, Take 1 tablet (5 mg total) by mouth daily with breakfast. Hold if morning sugar <100 or if not eating during the day, Disp: 90 tablet, Rfl: 0 .  Insulin Glargine (LANTUS SOLOSTAR) 100 UNIT/ML Solostar Pen, Inject 12 Units into the skin at bedtime. (Patient taking differently: Inject 12-14 Units into the skin at bedtime. ), Disp: 15 mL, Rfl: 2 .  Insulin Pen Needle 32G X 4 MM MISC, Use as directed with insulin daily, Disp: 100 each, Rfl: 1 .  Lactobacillus Rhamnosus, GG, (RA PROBIOTIC DIGESTIVE CARE) CAPS, Take 1 capsule by mouth daily. , Disp: , Rfl:  .  losartan (COZAAR) 50 MG tablet, Take 1 tablet (50 mg total) by mouth every evening., Disp: 90 tablet, Rfl: 1 .  metFORMIN (GLUCOPHAGE) 500 MG tablet, TAKE 2 TABLETS BY MOUTH TWICE DAILY WITHA MEAL, Disp: 180 tablet, Rfl: 2 .  metoprolol succinate (TOPROL-XL) 25 MG 24 hr tablet, Take 1 tablet (25 mg total) by mouth every morning., Disp: 90 tablet, Rfl:  1 .  nitroGLYCERIN (NITROSTAT) 0.4 MG SL tablet, Place 1 tablet (0.4 mg total) under the tongue every 5 (five) minutes as needed for chest pain. Max of 3 total; call 911 (Patient not taking: Reported on 06/16/2020), Disp: 25 tablet, Rfl: 5 .  pantoprazole (PROTONIX) 40 MG tablet, Take 1 tablet (40 mg total) by mouth 2 (two) times daily for 14 days., Disp: 28 tablet, Rfl: 0 .  umeclidinium-vilanterol (ANORO ELLIPTA) 62.5-25 MCG/INH AEPB, Inhale 1 puff into the lungs daily. , Disp: , Rfl:   No results found.  No images are attached to the encounter.   CMP Latest Ref Rng & Units 06/06/2019  Glucose 65 - 99 mg/dL 158(H)  BUN 7 - 25 mg/dL 7  Creatinine 0.70 - 1.33 mg/dL 0.75  Sodium 135 - 146 mmol/L 138  Potassium 3.5 - 5.3 mmol/L 4.3  Chloride 98 - 110 mmol/L 103  CO2 20 - 32 mmol/L 28  Calcium 8.6 - 10.3 mg/dL 9.3  Total Protein 6.1 - 8.1 g/dL 6.0(L)  Total Bilirubin 0.2 - 1.2 mg/dL 1.1  Alkaline Phos 39 - 117 IU/L -  AST 10 - 35 U/L 11  ALT 9 - 46 U/L 15   CBC Latest Ref Rng & Units 03/10/2020  WBC 4.0 - 10.5 K/uL 11.8(H)  Hemoglobin 13.0 - 17.0 g/dL 14.1  Hematocrit 39.0 - 52.0 % 40.9  Platelets 150 - 400 K/uL 215     Observation/objective:  Assessment and plan: Patient is a 59 year old male with history of chronic neutrophilia here for routine follow-up  Follow-up instructions:  I discussed the assessment and treatment plan with the patient. The patient was provided an opportunity to ask questions and all were answered. The patient agreed with the plan and demonstrated an understanding of the instructions.   The patient was advised to call back or seek an in-person evaluation if the symptoms worsen or if the condition fails to improve as anticipated.  I provided *** minutes of {Blank single:19197::"face-to-face video visit time","non face-to-face telephone visit time"} during this encounter, and > 50% was spent counseling as documented under my assessment & plan.  Visit  Diagnosis: No diagnosis found.  Dr. Randa Evens, MD, MPH [place] at [facility] Tel- [phone] 09/23/2020 8:28 AM

## 2020-09-23 NOTE — Telephone Encounter (Signed)
Call placed to pt in regards to missed appointment, unable to reach pt. Left VM to return call.

## 2020-10-02 LAB — H. PYLORI BREATH TEST: H pylori Breath Test: NEGATIVE

## 2020-10-03 LAB — H PYLORI, IGM, IGG, IGA AB
H pylori, IgM Abs: <9 units (ref 0.0–8.9)
H. pylori, IgA Abs: 14.4 units — ABNORMAL HIGH (ref 0.0–8.9)
H. pylori, IgG AbS: 6.1 Index Value — ABNORMAL HIGH (ref 0.00–0.79)

## 2020-10-06 ENCOUNTER — Ambulatory Visit: Payer: BLUE CROSS/BLUE SHIELD | Admitting: Gastroenterology

## 2020-10-13 ENCOUNTER — Ambulatory Visit: Payer: BC Managed Care – PPO | Admitting: Gastroenterology

## 2020-10-14 ENCOUNTER — Other Ambulatory Visit: Payer: Self-pay

## 2020-10-14 ENCOUNTER — Encounter: Payer: Self-pay | Admitting: Gastroenterology

## 2020-10-14 ENCOUNTER — Ambulatory Visit (INDEPENDENT_AMBULATORY_CARE_PROVIDER_SITE_OTHER): Payer: BC Managed Care – PPO | Admitting: Gastroenterology

## 2020-10-14 VITALS — BP 161/79 | HR 86 | Temp 98.8°F | Ht 68.0 in | Wt 183.0 lb

## 2020-10-14 DIAGNOSIS — R197 Diarrhea, unspecified: Secondary | ICD-10-CM | POA: Diagnosis not present

## 2020-10-14 DIAGNOSIS — Z8619 Personal history of other infectious and parasitic diseases: Secondary | ICD-10-CM

## 2020-10-14 DIAGNOSIS — R109 Unspecified abdominal pain: Secondary | ICD-10-CM | POA: Diagnosis not present

## 2020-10-14 DIAGNOSIS — R1084 Generalized abdominal pain: Secondary | ICD-10-CM

## 2020-10-15 ENCOUNTER — Encounter: Payer: Self-pay | Admitting: Emergency Medicine

## 2020-10-15 ENCOUNTER — Other Ambulatory Visit: Payer: Self-pay

## 2020-10-15 ENCOUNTER — Ambulatory Visit
Admission: EM | Admit: 2020-10-15 | Discharge: 2020-10-15 | Disposition: A | Discharge status: Home / Self Care | Payer: BC Managed Care – PPO | Attending: Sports Medicine | Admitting: Sports Medicine

## 2020-10-15 DIAGNOSIS — R059 Cough, unspecified: Secondary | ICD-10-CM | POA: Diagnosis not present

## 2020-10-15 DIAGNOSIS — Z7984 Long term (current) use of oral hypoglycemic drugs: Secondary | ICD-10-CM | POA: Diagnosis not present

## 2020-10-15 DIAGNOSIS — R509 Fever, unspecified: Secondary | ICD-10-CM | POA: Diagnosis not present

## 2020-10-15 DIAGNOSIS — R051 Acute cough: Secondary | ICD-10-CM | POA: Diagnosis present

## 2020-10-15 DIAGNOSIS — J069 Acute upper respiratory infection, unspecified: Secondary | ICD-10-CM

## 2020-10-15 DIAGNOSIS — M791 Myalgia, unspecified site: Secondary | ICD-10-CM

## 2020-10-15 DIAGNOSIS — U071 COVID-19: Secondary | ICD-10-CM | POA: Insufficient documentation

## 2020-10-15 DIAGNOSIS — Z7982 Long term (current) use of aspirin: Secondary | ICD-10-CM | POA: Diagnosis not present

## 2020-10-15 DIAGNOSIS — Z955 Presence of coronary angioplasty implant and graft: Secondary | ICD-10-CM | POA: Insufficient documentation

## 2020-10-15 DIAGNOSIS — F17210 Nicotine dependence, cigarettes, uncomplicated: Secondary | ICD-10-CM | POA: Diagnosis not present

## 2020-10-15 DIAGNOSIS — Z794 Long term (current) use of insulin: Secondary | ICD-10-CM | POA: Diagnosis not present

## 2020-10-15 DIAGNOSIS — Z888 Allergy status to other drugs, medicaments and biological substances status: Secondary | ICD-10-CM | POA: Insufficient documentation

## 2020-10-15 DIAGNOSIS — Z79899 Other long term (current) drug therapy: Secondary | ICD-10-CM | POA: Diagnosis not present

## 2020-10-15 DIAGNOSIS — R519 Headache, unspecified: Secondary | ICD-10-CM

## 2020-10-15 LAB — SARS CORONAVIRUS 2 (TAT 6-24 HRS): SARS Coronavirus 2: POSITIVE — AB

## 2020-10-15 NOTE — ED Provider Notes (Signed)
[facility]    CSN: [id number] Arrival date & time: 10/15/20  0936      History   Chief Complaint No chief complaint on file.   HPI Scott Rosario is a 60 y.o. male.   Patient is a pleasant 60 year old male who presents for evaluation of 3 to 4 days of COVID-like symptoms.  He reports having body aches, congestion, mild cough, headache low-grade fever, chills with some loose stools nausea but no vomiting.  Denies chest pain shortness of breath.  No abdominal symptoms.  No ear pain no sore throat.  No urinary symptoms.  He has not been vaccinated against COVID or influenza.  No COVID exposure that he knows of.  No COVID history.  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  Took a home test which was negative.  Since his symptoms have persisted he comes in today for further evaluation.  No red flag signs or symptoms elicited on history.     Past Medical History:  Diagnosis Date  . Acute calculous cholecystitis   . Alkaline phosphatase elevation   . Aortic aneurysm ([place]) 12/10/2018   3.4 cm March 2020  . Aortic atherosclerosis ([place])    on CT 02/25/14  . Asthma   . CAD (coronary artery disease)   . Cirrhosis ([place])   . Cirrhosis ([place])   . COPD (chronic obstructive pulmonary disease) ([place])   . Diabetes mellitus without complication ([place])   . Emphysema of lung ([place])   . Gallstone 12/10/2018  . Gastritis without bleeding   . GERD (gastroesophageal reflux disease)   . Heart attack ([place])    2005, 2017 (stent after each)  . History of hiatal hernia    small  . History of kidney stones    h/o  . Hyperlipidemia   . Hypertension   . Ingrown toenail of both feet 11/29/2018  . Pneumonia 2018  . Pulmonary nodules    x 3, (42mm, 79mm, 69mm) chest CT February 25, 2014  . Renal cyst    on CT 02/25/14  . Tobacco use     Patient Active Problem List   Diagnosis Date Noted  . Dyspepsia   . Nausea without vomiting   . Acute gastric ulcer without hemorrhage or perforation   . Liver  disease 11/21/2019  . Polyp of sigmoid colon   . Aortic aneurysm ([place]) 12/10/2018  . Overweight (BMI 25.0-29.9) 12/20/2017  . Acquired trigger finger 02/15/2017  . COPD (chronic obstructive pulmonary disease) ([place])   . Diabetes type 2, uncontrolled ([place])   . GERD (gastroesophageal reflux disease)   . Hyperlipidemia   . Hypertension   . Tobacco use   . CAD (coronary artery disease)   . Aortic atherosclerosis ([place])   . Multiple pulmonary nodules determined by computed tomography of lung 12/31/2014    Past Surgical History:  Procedure Laterality Date  . APPENDECTOMY    . CARDIAC CATHETERIZATION Left 03/09/2016   Procedure: Left Heart Cath and Coronary Angiography;  Surgeon: Corey Skains, MD;  Location: [place];  Service: Cardiovascular;  Laterality: Left;  . CARDIAC CATHETERIZATION N/A 03/09/2016   Procedure: Coronary Stent Intervention;  Surgeon: Isaias Cowman, MD;  Location: [facility];  Service: Cardiovascular;  Laterality: N/A;  . CHOLECYSTECTOMY N/A 01/31/2019   Procedure: LAPAROSCOPIC CHOLECYSTECTOMY - DIABETIC;  Surgeon: Vickie Epley, MD;  Location: [facility];  Service: General;  Laterality: N/A;  . COLONOSCOPY WITH PROPOFOL N/A 03/22/2019   Procedure: COLONOSCOPY WITH  PROPOFOL;  Surgeon: Lucilla Lame, MD;  Location: [place];  Service: Endoscopy;  Laterality: N/A;  Diabetic - insulin and oral meds  . CORONARY ANGIOPLASTY WITH STENT PLACEMENT  10/25/2003   x 2  . ESOPHAGOGASTRODUODENOSCOPY (EGD) WITH PROPOFOL N/A 10/24/2017   Procedure: ESOPHAGOGASTRODUODENOSCOPY (EGD) WITH PROPOFOL;  Surgeon: Lucilla Lame, MD;  Location: [facility];  Service: Endoscopy;  Laterality: N/A;  Diabetic - oral meds  . ESOPHAGOGASTRODUODENOSCOPY (EGD) WITH PROPOFOL N/A 05/01/2020   Procedure: ESOPHAGOGASTRODUODENOSCOPY (EGD) WITH PROPOFOL;  Surgeon: Virgel Manifold, MD;  Location: [facility];  Service: Endoscopy;  Laterality: N/A;  . POLYPECTOMY   03/22/2019   Procedure: POLYPECTOMY;  Surgeon: Lucilla Lame, MD;  Location: [place];  Service: Endoscopy;;       Home Medications    Prior to Admission medications   Medication Sig Start Date End Date Taking? Authorizing Provider  ascorbic acid (VITAMIN C) 1000 MG tablet Take 1 tablet by mouth 1 day or 1 dose.   Yes [provider]  aspirin EC 81 MG tablet Take 81 mg by mouth daily.   Yes [provider]  atorvastatin (LIPITOR) 80 MG tablet Take 1 tablet (80 mg total) by mouth at bedtime. 01/23/20  Yes Delsa Grana, PA-C  glipiZIDE (GLUCOTROL XL) 5 MG 24 hr tablet Take 1 tablet (5 mg total) by mouth daily with breakfast. Hold if morning sugar <100 or if not eating during the day 07/04/19  Yes Delsa Grana, PA-C  Insulin Glargine (LANTUS SOLOSTAR) 100 UNIT/ML Solostar Pen Inject 12 Units into the skin at bedtime. Patient taking differently: Inject 12-14 Units into the skin at bedtime. 07/25/18  Yes Poulose, Bethel Born, NP  losartan (COZAAR) 50 MG tablet Take 1 tablet (50 mg total) by mouth every evening. 03/05/19  Yes Poulose, Bethel Born, NP  metFORMIN (GLUCOPHAGE) 500 MG tablet TAKE 2 TABLETS BY MOUTH TWICE DAILY WITHA MEAL 11/13/19  Yes Hubbard Hartshorn, FNP  metoprolol succinate (TOPROL-XL) 25 MG 24 hr tablet Take 1 tablet (25 mg total) by mouth every morning. 03/05/19  Yes Poulose, Bethel Born, NP  albuterol (PROVENTIL HFA;VENTOLIN HFA) 108 (90 Base) MCG/ACT inhaler Inhale 2 puffs into the lungs every 4 (four) hours as needed for wheezing or shortness of breath. 12/21/18   Poulose, Bethel Born, NP  amoxicillin (AMOXIL) 500 MG capsule Take 1,000 mg by mouth 2 (two) times daily. 10/05/20   [provider]  baclofen (LIORESAL) 10 MG tablet Take 10 mg by mouth daily. 10/04/20   [provider]  folic acid-pyridoxine-cyancobalamin (FOLTX) 2.5-25-2 MG TABS tablet Take 1 tablet by mouth daily.     [provider]  Insulin Pen Needle 32G X 4 MM MISC Use  as directed with insulin daily 06/06/19   Delsa Grana, PA-C  Lactobacillus Rhamnosus, GG, (RA PROBIOTIC DIGESTIVE CARE) CAPS Take 1 capsule by mouth daily.     [provider]  nitroGLYCERIN (NITROSTAT) 0.4 MG SL tablet Place 1 tablet (0.4 mg total) under the tongue every 5 (five) minutes as needed for chest pain. Max of 3 total; call 911 09/22/17   [name], Satira Anis, MD  pantoprazole (PROTONIX) 40 MG tablet Take 1 tablet (40 mg total) by mouth 2 (two) times daily for 14 days. 07/29/20 08/12/20  Virgel Manifold, MD  umeclidinium-vilanterol (ANORO ELLIPTA) 62.5-25 MCG/INH AEPB Inhale 1 puff into the lungs daily.  02/27/20   [provider]    Family History Family History  Problem Relation Age of Onset  .  Diabetes Mother   . Hypertension Mother   . Arthritis Mother   . Hyperlipidemia Mother   . Heart attack Father   . Epilepsy Father   . Diabetes Father   . Seizures Father   . Heart disease Father   . Hyperlipidemia Father   . Hypertension Father   . Stroke Father   . Diabetes Brother   . Hyperlipidemia Brother   . Hypertension Brother   . Cancer Maternal Grandmother        unknown  . Diabetes Paternal Grandmother   . Heart disease Paternal Grandmother   . Hyperlipidemia Paternal Grandmother   . Hypertension Paternal Grandmother   . Heart attack Paternal Grandfather     Social History Social History   Tobacco Use  . Smoking status: Current Every Day Smoker    Packs/day: 1.00    Years: 45.00    Pack years: 45.00    Types: Cigarettes  . Smokeless tobacco: Never Used  . Tobacco comment: since age 53  Vaping Use  . Vaping Use: Never used  Substance Use Topics  . Alcohol use: No    Alcohol/week: 0.0 standard drinks  . Drug use: No     Allergies   Canagliflozin, Semaglutide, and Tape   Review of Systems Review of Systems  Constitutional: Positive for chills, fatigue and fever. Negative for activity change and appetite change.  HENT: Positive for  congestion. Negative for ear discharge, ear pain, rhinorrhea, sinus pressure, sinus pain and sore throat.   Respiratory: Positive for cough. Negative for apnea, chest tightness, shortness of breath, wheezing and stridor.   Cardiovascular: Negative for chest pain and palpitations.  Gastrointestinal: Positive for diarrhea and nausea. Negative for abdominal pain and vomiting.  Genitourinary: Negative for dysuria, frequency, hematuria and urgency.  Musculoskeletal: Positive for myalgias.  Skin: Negative for color change, pallor, rash and wound.  Neurological: Positive for headaches. Negative for dizziness, tremors, syncope, speech difficulty, weakness and light-headedness.  All other systems reviewed and are negative.    Physical Exam Triage Vital Signs ED Triage Vitals  Enc Vitals Group     BP 10/15/20 1028 126/79     Pulse Rate 10/15/20 1028 86     Resp 10/15/20 1028 18     Temp 10/15/20 1028 98.9 F (37.2 C)     Temp Source 10/15/20 1028 Oral     SpO2 10/15/20 1028 97 %     Weight 10/15/20 1022 180 lb (81.6 kg)     Height 10/15/20 1022 5\' 8"  (1.727 m)     Head Circumference --      Peak Flow --      Pain Score 10/15/20 1022 7     Pain Loc --      Pain Edu? --      Excl. in [place]? --    No data found.  Updated Vital Signs BP 126/79 (BP Location: Left Arm)   Pulse 86   Temp 98.9 F (37.2 C) (Oral)   Resp 18   Ht 5\' 8"  (1.727 m)   Wt 81.6 kg   SpO2 97%   BMI 27.37 kg/m   Visual Acuity Right Eye Distance:   Left Eye Distance:   Bilateral Distance:    Right Eye Near:   Left Eye Near:    Bilateral Near:     Physical Exam Vitals and nursing note reviewed.  Constitutional:      General: He is not in acute distress.    Appearance: Normal appearance. He  is not ill-appearing, toxic-appearing or diaphoretic.  HENT:     Head: Normocephalic and atraumatic.     Right Ear: Tympanic membrane normal.     Left Ear: Tympanic membrane normal.     Nose: Congestion and rhinorrhea  present.     Mouth/Throat:     Mouth: Mucous membranes are dry.     Pharynx: Posterior oropharyngeal erythema present. No oropharyngeal exudate.  Eyes:     Extraocular Movements: Extraocular movements intact.     Conjunctiva/sclera: Conjunctivae normal.     Pupils: Pupils are equal, round, and reactive to light.  Cardiovascular:     Rate and Rhythm: Normal rate and regular rhythm.     Pulses: Normal pulses.     Heart sounds: Normal heart sounds. No murmur heard. No friction rub. No gallop.   Pulmonary:     Effort: Pulmonary effort is normal. No respiratory distress.     Breath sounds: Normal breath sounds. No stridor. No wheezing, rhonchi or rales.  Musculoskeletal:     Cervical back: Normal range of motion and neck supple. No rigidity or tenderness.  Lymphadenopathy:     Cervical: Cervical adenopathy present.  Skin:    General: Skin is warm and dry.     Capillary Refill: Capillary refill takes less than 2 seconds.  Neurological:     General: No focal deficit present.     Mental Status: He is alert and oriented to person, place, and time.  Psychiatric:        Mood and Affect: Mood normal.        Behavior: Behavior normal.      UC Treatments / Results  Labs (all labs ordered are listed, but only abnormal results are displayed) Labs Reviewed  SARS CORONAVIRUS 2 (TAT 6-24 HRS)    EKG   Radiology No results found.  Procedures Procedures (including critical care time)  Medications Ordered in UC Medications - No data to display  Initial Impression / Assessment and Plan / UC Course  I have reviewed the triage vital signs and the nursing notes.  Pertinent labs & imaging results that were available during my care of the patient were reviewed by me and considered in my medical decision making (see chart for details).   Clinical impression: 60 year old male with 3 to 4 days of COVID-like symptoms.  Exam and vitals are reassuring.  Treatment plan: 1.  The findings and  treatment plan were discussed in detail with the patient.  Patient was in agreement. 2.  Recommended getting a COVID test.  Did discuss that it takes 6 to 24 hours to come back.  He should isolate pending the results.  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  If they are positive he will get a call with an updated plan including quarantining per current CDC guidelines. 3.  Supportive care, over-the-counter meds as needed.  He certainly should use Tylenol or Motrin for fever or discomfort.  Over-the-counter cough medicine and/or Mucinex for cough and congestion. 4.  Red flag signs and symptoms were discussed in detail and when to seek out immediate medical attention.  Patient voiced verbal understanding. 5.  We will give him educational handouts on his situation. 6.  Follow-up here as needed.      Final Clinical Impressions(s) / UC Diagnoses   Final diagnoses:  Acute upper respiratory infection  Cough  Fever, unspecified  Myalgia  Acute nonintractable headache, unspecified headache type     Discharge  Instructions     Recommended getting a COVID test.  Did discuss that it takes 6 to 24 hours to come back.  He should isolate pending the results.  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  If they are positive he will get a call with an updated plan including quarantining per current CDC guidelines. Supportive care, over-the-counter meds as needed.  He certainly should use Tylenol or Motrin for fever or discomfort.  Over-the-counter cough medicine and/or Mucinex for cough and congestion. Red flag signs and symptoms were discussed in detail and when to seek out immediate medical attention.  Patient voiced verbal understanding. We will give him educational handouts on his situation. Follow-up here as needed.    ED Prescriptions    None     PDMP not reviewed this encounter.   Verda Cumins, MD 10/15/20 1216

## 2020-10-15 NOTE — Discharge Instructions (Addendum)
Recommended getting a COVID test.  Did discuss that it takes 6 to 24 hours to come back.  He should isolate pending the results.  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  If they are positive he will get a call with an updated plan including quarantining per current CDC guidelines. Supportive care, over-the-counter meds as needed.  He certainly should use Tylenol or Motrin for fever or discomfort.  Over-the-counter cough medicine and/or Mucinex for cough and congestion. Red flag signs and symptoms were discussed in detail and when to seek out immediate medical attention.  Patient voiced verbal understanding. We will give him educational handouts on his situation. Follow-up here as needed.

## 2020-10-15 NOTE — ED Triage Notes (Signed)
Pt c/o body aches, low grade temp, cough, nasal congestion x 3 days. No meds pta.

## 2020-10-16 ENCOUNTER — Telehealth: Payer: Self-pay | Admitting: Oncology

## 2020-10-16 ENCOUNTER — Encounter: Payer: Self-pay | Admitting: Oncology

## 2020-10-16 NOTE — Progress Notes (Signed)
[name], MD [address]  [place]  [place], [place] [address]  Main: [phone]  Fax: [phone]   Primary Care Physician: [name], MD   Chief complaint: Abdominal pain  HPI: Scott Rosario is a 60 y.o. male with history of H. pylori here for follow-up of abdominal pain.  Continues to report abdominal pain and diarrhea.  He has gone through several courses of antibiotics for H. pylori.  His breath test recently was negative off PPI.  However, his IgA antibody and IgG antibody is positive and he continues to have abdominal pain symptoms.  Given that his biopsies previously have been positive even though he has had negative breath test or serology, and he has ongoing abdominal symptoms, I would be important to be biopsy to evaluate for any evidence of ongoing infection with H. pylori and retreat if necessary.  He has never had biopsies for microscopic colitis either  Current Outpatient Medications  Medication Sig Dispense Refill  . albuterol (PROVENTIL HFA;VENTOLIN HFA) 108 (90 Base) MCG/ACT inhaler Inhale 2 puffs into the lungs every 4 (four) hours as needed for wheezing or shortness of breath. 1 Inhaler 1  . amoxicillin (AMOXIL) 500 MG capsule Take 1,000 mg by mouth 2 (two) times daily.    Marland Kitchen ascorbic acid (VITAMIN C) 1000 MG tablet Take 1 tablet by mouth 1 day or 1 dose.    Marland Kitchen aspirin EC 81 MG tablet Take 81 mg by mouth daily.    Marland Kitchen atorvastatin (LIPITOR) 80 MG tablet Take 1 tablet (80 mg total) by mouth at bedtime. 90 tablet 1  . baclofen (LIORESAL) 10 MG tablet Take 10 mg by mouth daily.    . folic acid-pyridoxine-cyancobalamin (FOLTX) 2.5-25-2 MG TABS tablet Take 1 tablet by mouth daily.     Marland Kitchen glipiZIDE (GLUCOTROL XL) 5 MG 24 hr tablet Take 1 tablet (5 mg total) by mouth daily with breakfast. Hold if morning sugar <100 or if not eating during the day 90 tablet 0  . Insulin Glargine (LANTUS SOLOSTAR) 100 UNIT/ML Solostar Pen Inject 12 Units into the skin  at bedtime. (Patient taking differently: Inject 12-14 Units into the skin at bedtime.) 15 mL 2  . Insulin Pen Needle 32G X 4 MM MISC Use as directed with insulin daily 100 each 1  . Lactobacillus Rhamnosus, GG, (RA PROBIOTIC DIGESTIVE CARE) CAPS Take 1 capsule by mouth daily.     Marland Kitchen losartan (COZAAR) 50 MG tablet Take 1 tablet (50 mg total) by mouth every evening. 90 tablet 1  . metFORMIN (GLUCOPHAGE) 500 MG tablet TAKE 2 TABLETS BY MOUTH TWICE DAILY WITHA MEAL 180 tablet 2  . metoprolol succinate (TOPROL-XL) 25 MG 24 hr tablet Take 1 tablet (25 mg total) by mouth every morning. 90 tablet 1  . nitroGLYCERIN (NITROSTAT) 0.4 MG SL tablet Place 1 tablet (0.4 mg total) under the tongue every 5 (five) minutes as needed for chest pain. Max of 3 total; call 911 25 tablet 5  . umeclidinium-vilanterol (ANORO ELLIPTA) 62.5-25 MCG/INH AEPB Inhale 1 puff into the lungs daily.     . pantoprazole (PROTONIX) 40 MG tablet Take 1 tablet (40 mg total) by mouth 2 (two) times daily for 14 days. 28 tablet 0   No current facility-administered medications for this visit.    Allergies as of 10/14/2020 - Review Complete 10/14/2020  Allergen Reaction Noted  . Canagliflozin Other (See Comments) 01/25/2017  . Semaglutide Diarrhea and Nausea And Vomiting 10/19/2017  . Tape  01/25/2019    ROS:  General: Negative for anorexia, weight loss, fever, chills, fatigue, weakness. ENT: Negative for hoarseness, difficulty swallowing , nasal congestion. CV: Negative for chest pain, angina, palpitations, dyspnea on exertion, peripheral edema.  Respiratory: Negative for dyspnea at rest, dyspnea on exertion, cough, sputum, wheezing.  GI: See history of present illness. GU:  Negative for dysuria, hematuria, urinary incontinence, urinary frequency, nocturnal urination.  Endo: Negative for unusual weight change.    Physical Examination:   BP (!) 161/79   Pulse 86   Temp 98.8 F (37.1 C) (Oral)   Ht 5\' 8"  (1.727 m)   Wt 183 lb  (83 kg)   BMI 27.83 kg/m   General: Well-nourished, well-developed in no acute distress.  Eyes: No icterus. Conjunctivae pink. Mouth: Oropharyngeal mucosa moist and pink , no lesions erythema or exudate. Neck: Supple, Trachea midline Abdomen: Bowel sounds are normal, nontender, nondistended, no hepatosplenomegaly or masses, no abdominal bruits or hernia , no rebound or guarding.   Extremities: No lower extremity edema. No clubbing or deformities. Neuro: Alert and oriented x 3.  Grossly intact. Skin: Warm and dry, no jaundice.   Psych: Alert and cooperative, normal mood and affect.   Labs: CMP     Component Value Date/Time   NA 138 06/06/2019 0000   NA 138 02/17/2016 0940   K 4.3 06/06/2019 0000   CL 103 06/06/2019 0000   CO2 28 06/06/2019 0000   GLUCOSE 158 (H) 06/06/2019 0000   BUN 7 06/06/2019 0000   BUN 14 02/17/2016 0940   CREATININE 0.75 06/06/2019 0000   CALCIUM 9.3 06/06/2019 0000   PROT 6.0 (L) 06/06/2019 0000   PROT 6.2 02/28/2019 1013   ALBUMIN 4.3 02/28/2019 1013   AST 11 06/06/2019 0000   ALT 15 06/06/2019 0000   ALKPHOS 148 (H) 02/28/2019 1013   BILITOT 1.1 06/06/2019 0000   BILITOT 0.9 02/28/2019 1013   GFRNONAA 100 06/06/2019 0000   GFRAA 116 06/06/2019 0000   Lab Results  Component Value Date   WBC 11.8 (H) 03/10/2020   HGB 14.1 03/10/2020   HCT 40.9 03/10/2020   MCV 85.9 03/10/2020   PLT 215 03/10/2020    Imaging Studies: No results found.  Assessment and Plan:   Scott Rosario is a 60 y.o. y/o male here for follow-up of abdominal pain  Given ongoing symptoms, we discussed options of conservative management versus endoscopic procedures.  Patient and wife would like to proceed with endoscopy after discussing risks and benefits in detail  Obtain biopsies for H. pylori along with antibiotic sensitivity to be done on the biopsies  Obtain biopsies for microscopic colitis as well during colonoscopy  I have discussed alternative options, risks  & benefits,  which include, but are not limited to, bleeding, infection, perforation,respiratory complication & drug reaction.  The patient agrees with this plan & written consent will be obtained.       Dr [name]

## 2020-10-16 NOTE — Telephone Encounter (Signed)
Called to discuss with patient about COVID-19 symptoms and the use of one of the available treatments for those with mild to moderate Covid symptoms and at a high risk of hospitalization.  Pt appears to qualify for outpatient treatment due to co-morbid conditions and/or a member of an at-risk group in accordance with the FDA Emergency Use Authorization.    Symptom onset: 10/11/20 Vaccinated: No Booster? No Immunocompromised? No  Qualifiers:  Past Medical History:  Diagnosis Date  . Acute calculous cholecystitis   . Alkaline phosphatase elevation   . Aortic aneurysm ([place]) 12/10/2018   3.4 cm March 2020  . Aortic atherosclerosis ([place])    on CT 02/25/14  . Asthma   . CAD (coronary artery disease)   . Cirrhosis ([place])   . Cirrhosis ([place])   . COPD (chronic obstructive pulmonary disease) ([place])   . Diabetes mellitus without complication ([place])   . Emphysema of lung ([place])   . Gallstone 12/10/2018  . Gastritis without bleeding   . GERD (gastroesophageal reflux disease)   . Heart attack ([place])    2005, 2017 (stent after each)  . History of hiatal hernia    small  . History of kidney stones    h/o  . Hyperlipidemia   . Hypertension   . Ingrown toenail of both feet 11/29/2018  . Pneumonia 2018  . Pulmonary nodules    x 3, (1mm, 42mm, 13mm) chest CT February 25, 2014  . Renal cyst    on CT 02/25/14  . Tobacco use    Spoke to patient and he is feeling tremendously better.   He was provided our telephone number should his symptoms worsen.   Jacquelin Hawking

## 2020-10-22 ENCOUNTER — Encounter: Payer: Self-pay | Admitting: Gastroenterology

## 2020-10-22 NOTE — Anesthesia Preprocedure Evaluation (Addendum)
Anesthesia Evaluation    Airway Mallampati: II  TM Distance: >3 FB Neck ROM: full    Dental no notable dental hx.    Pulmonary COPD, Current Smoker and Patient abstained from smoking.,  COVID + 10/15/20   Pulmonary exam normal        Cardiovascular hypertension, + CAD and + Past MI  Normal cardiovascular exam Rhythm:regular Rate:Normal     Neuro/Psych negative neurological ROS  negative psych ROS   GI/Hepatic GERD  ,  Endo/Other  diabetes, Well Controlled, Type 2, Insulin Dependent  Renal/GU      Musculoskeletal negative musculoskeletal ROS (+)   Abdominal   Peds  Hematology negative hematology ROS (+)   Anesthesia Other Findings   Reproductive/Obstetrics                            Anesthesia Physical Anesthesia Plan  ASA: III  Anesthesia Plan: General   Post-op Pain Management:    Induction:   PONV Risk Score and Plan:   Airway Management Planned:   Additional Equipment:   Intra-op Plan:   Post-operative Plan:   Informed Consent: I have reviewed the patients History and Physical, chart, labs and discussed the procedure including the risks, benefits and alternatives for the proposed anesthesia with the patient or authorized representative who has indicated his/her understanding and acceptance.       Plan Discussed with:   Anesthesia Plan Comments:         Anesthesia Quick Evaluation

## 2020-10-30 ENCOUNTER — Other Ambulatory Visit: Payer: BC Managed Care – PPO | Attending: Gastroenterology

## 2020-10-31 NOTE — Discharge Instructions (Signed)

## 2020-11-04 ENCOUNTER — Other Ambulatory Visit: Payer: Self-pay

## 2020-11-04 ENCOUNTER — Ambulatory Visit: Payer: BC Managed Care – PPO | Admitting: Anesthesiology

## 2020-11-04 ENCOUNTER — Ambulatory Visit
Admission: RE | Disposition: A | Discharge status: Home / Self Care | Payer: Self-pay | Source: Home / Self Care | Attending: Gastroenterology

## 2020-11-04 ENCOUNTER — Ambulatory Visit
Admission: RE | Admit: 2020-11-04 | Discharge: 2020-11-04 | Disposition: A | Discharge status: Home / Self Care | Payer: BC Managed Care – PPO | Attending: Gastroenterology | Admitting: Gastroenterology

## 2020-11-04 ENCOUNTER — Ambulatory Visit: Payer: BC Managed Care – PPO | Admitting: Gastroenterology

## 2020-11-04 DIAGNOSIS — K3189 Other diseases of stomach and duodenum: Secondary | ICD-10-CM | POA: Diagnosis not present

## 2020-11-04 DIAGNOSIS — R109 Unspecified abdominal pain: Secondary | ICD-10-CM | POA: Insufficient documentation

## 2020-11-04 DIAGNOSIS — Z8619 Personal history of other infectious and parasitic diseases: Secondary | ICD-10-CM | POA: Diagnosis not present

## 2020-11-04 DIAGNOSIS — K635 Polyp of colon: Secondary | ICD-10-CM

## 2020-11-04 DIAGNOSIS — K319 Disease of stomach and duodenum, unspecified: Secondary | ICD-10-CM

## 2020-11-04 DIAGNOSIS — R1084 Generalized abdominal pain: Secondary | ICD-10-CM | POA: Diagnosis not present

## 2020-11-04 DIAGNOSIS — K52839 Microscopic colitis, unspecified: Secondary | ICD-10-CM | POA: Diagnosis not present

## 2020-11-04 DIAGNOSIS — Z7984 Long term (current) use of oral hypoglycemic drugs: Secondary | ICD-10-CM | POA: Insufficient documentation

## 2020-11-04 DIAGNOSIS — R197 Diarrhea, unspecified: Secondary | ICD-10-CM | POA: Diagnosis not present

## 2020-11-04 DIAGNOSIS — Z7982 Long term (current) use of aspirin: Secondary | ICD-10-CM | POA: Diagnosis not present

## 2020-11-04 DIAGNOSIS — D123 Benign neoplasm of transverse colon: Secondary | ICD-10-CM | POA: Insufficient documentation

## 2020-11-04 DIAGNOSIS — Z888 Allergy status to other drugs, medicaments and biological substances status: Secondary | ICD-10-CM | POA: Insufficient documentation

## 2020-11-04 DIAGNOSIS — Z7951 Long term (current) use of inhaled steroids: Secondary | ICD-10-CM | POA: Diagnosis not present

## 2020-11-04 DIAGNOSIS — F17210 Nicotine dependence, cigarettes, uncomplicated: Secondary | ICD-10-CM | POA: Insufficient documentation

## 2020-11-04 DIAGNOSIS — D125 Benign neoplasm of sigmoid colon: Secondary | ICD-10-CM | POA: Insufficient documentation

## 2020-11-04 DIAGNOSIS — K2950 Unspecified chronic gastritis without bleeding: Secondary | ICD-10-CM | POA: Insufficient documentation

## 2020-11-04 DIAGNOSIS — Z79899 Other long term (current) drug therapy: Secondary | ICD-10-CM | POA: Insufficient documentation

## 2020-11-04 DIAGNOSIS — Z8616 Personal history of COVID-19: Secondary | ICD-10-CM | POA: Diagnosis not present

## 2020-11-04 HISTORY — PX: ESOPHAGOGASTRODUODENOSCOPY (EGD) WITH PROPOFOL: SHX5813

## 2020-11-04 HISTORY — PX: POLYPECTOMY: SHX5525

## 2020-11-04 HISTORY — PX: COLONOSCOPY WITH PROPOFOL: SHX5780

## 2020-11-04 LAB — GLUCOSE, CAPILLARY
Glucose-Capillary: 183 mg/dL — ABNORMAL HIGH (ref 70–99)
Glucose-Capillary: 143 mg/dL — ABNORMAL HIGH (ref 70–99)

## 2020-11-04 SURGERY — COLONOSCOPY WITH PROPOFOL
Anesthesia: General

## 2020-11-04 MED ORDER — PROPOFOL 10 MG/ML IV BOLUS
INTRAVENOUS | Status: DC | PRN
  Administered 2020-11-04 (×3): 20 mg via INTRAVENOUS
  Administered 2020-11-04: 100 mg via INTRAVENOUS
  Administered 2020-11-04 (×3): 20 mg via INTRAVENOUS
  Administered 2020-11-04: 30 mg via INTRAVENOUS
  Administered 2020-11-04 (×5): 20 mg via INTRAVENOUS
  Administered 2020-11-04 (×5): 30 mg via INTRAVENOUS

## 2020-11-04 MED ORDER — GLYCOPYRROLATE 0.2 MG/ML IJ SOLN
INTRAMUSCULAR | Status: DC | PRN
  Administered 2020-11-04: .2 mg via INTRAVENOUS

## 2020-11-04 MED ORDER — LIDOCAINE HCL (CARDIAC) PF 100 MG/5ML IV SOSY
PREFILLED_SYRINGE | INTRAVENOUS | Status: DC | PRN
  Administered 2020-11-04: 20 mg via INTRAVENOUS

## 2020-11-04 MED ORDER — SODIUM CHLORIDE 0.9 % IV SOLN: INTRAVENOUS | Status: DC

## 2020-11-04 MED ORDER — STERILE WATER FOR IRRIGATION IR SOLN
Status: DC | PRN
  Administered 2020-11-04: 150 mL

## 2020-11-04 MED ORDER — LACTATED RINGERS IV SOLN: INTRAVENOUS | Status: DC

## 2020-11-04 SURGICAL SUPPLY — 38 items
BALLN DILATOR 10-12 8 (BALLOONS)
BALLN DILATOR 12-15 8 (BALLOONS)
BALLN DILATOR 15-18 8 (BALLOONS)
BALLN DILATOR CRE 0-12 8 (BALLOONS)
BALLN DILATOR ESOPH 8 10 CRE (MISCELLANEOUS) IMPLANT
BALLOON DILATOR 12-15 8 (BALLOONS) IMPLANT
BALLOON DILATOR 15-18 8 (BALLOONS) IMPLANT
BALLOON DILATOR CRE 0-12 8 (BALLOONS) IMPLANT
BLOCK BITE 60FR ADLT L/F GRN (MISCELLANEOUS) ×3 IMPLANT
CLIP HMST 235XBRD CATH ROT (MISCELLANEOUS) IMPLANT
CLIP RESOLUTION 360 11X235 (MISCELLANEOUS)
ELECT REM PT RETURN 9FT ADLT (ELECTROSURGICAL)
ELECTRODE REM PT RTRN 9FT ADLT (ELECTROSURGICAL) IMPLANT
FCP ESCP3.2XJMB 240X2.8X (MISCELLANEOUS)
FORCEPS BIOP RAD 4 LRG CAP 4 (CUTTING FORCEPS) ×3 IMPLANT
FORCEPS BIOP RJ4 240 W/NDL (MISCELLANEOUS)
FORCEPS ESCP3.2XJMB 240X2.8X (MISCELLANEOUS) IMPLANT
GOWN CVR UNV OPN BCK APRN NK (MISCELLANEOUS) ×4 IMPLANT
GOWN ISOL THUMB LOOP REG UNIV (MISCELLANEOUS) ×6
INJECTOR VARIJECT VIN23 (MISCELLANEOUS) IMPLANT
KIT DEFENDO VALVE AND CONN (KITS) IMPLANT
KIT PRC NS LF DISP ENDO (KITS) ×2 IMPLANT
KIT PROCEDURE OLYMPUS (KITS) ×3
MANIFOLD NEPTUNE II (INSTRUMENTS) ×3 IMPLANT
MARKER SPOT ENDO TATTOO 5ML (MISCELLANEOUS) IMPLANT
PROBE APC STR FIRE (PROBE) IMPLANT
RETRIEVER NET PLAT FOOD (MISCELLANEOUS) IMPLANT
RETRIEVER NET ROTH 2.5X230 LF (MISCELLANEOUS) IMPLANT
SNARE COLD EXACTO (MISCELLANEOUS) ×3 IMPLANT
SNARE SHORT THROW 13M SML OVAL (MISCELLANEOUS) IMPLANT
SNARE SHORT THROW 30M LRG OVAL (MISCELLANEOUS) IMPLANT
SNARE SNG USE RND 15MM (INSTRUMENTS) IMPLANT
SPOT EX ENDOSCOPIC TATTOO (MISCELLANEOUS)
SYR INFLATION 60ML (SYRINGE) IMPLANT
TRAP ETRAP POLY (MISCELLANEOUS) ×3 IMPLANT
VARIJECT INJECTOR VIN23 (MISCELLANEOUS)
WATER STERILE IRR 250ML POUR (IV SOLUTION) ×3 IMPLANT
WIRE CRE 18-20MM 8CM F G (MISCELLANEOUS) IMPLANT

## 2020-11-04 NOTE — Transfer of Care (Signed)
Immediate Anesthesia Transfer of Care Note  Patient: Scott Rosario  Procedure(s) Performed: COLONOSCOPY WITH PROPOFOL (N/A ) ESOPHAGOGASTRODUODENOSCOPY (EGD) WITH PROPOFOL with biopsy (N/A ) POLYPECTOMY  Patient Location: PACU  Anesthesia Type: General  Level of Consciousness: awake, alert  and patient cooperative  Airway and Oxygen Therapy: Patient Spontanous Breathing   Post-op Assessment: Post-op Vital signs reviewed, Patient's Cardiovascular Status Stable, Respiratory Function Stable, Patent Airway and No signs of Nausea or vomiting  Post-op Vital Signs: Reviewed and stable  Complications: No complications documented.

## 2020-11-04 NOTE — Op Note (Signed)
[facility] Gastroenterology Patient Name: Scott Rosario Procedure Date: 11/04/2020 7:56 AM MRN: [id number] Account #: [id number] Date of Birth: 17-Feb-1961 Admit Type: Outpatient Age: 60 Room: [facility] OR ROOM 01 Gender: Male Note Status: Finalized Procedure:             Upper GI endoscopy Indications:           Abdominal pain, Follow-up of Helicobacter pylori, For                         therapy of Helicobacter pylori, Diarrhea Providers:             Keshara Kiger B. Bonna Gains MD, MD Referring MD:          [name] (Referring MD) Medicines:             Monitored Anesthesia Care Complications:         No immediate complications. Procedure:             Pre-Anesthesia Assessment:                        - Prior to the procedure, a History and Physical was                         performed, and patient medications, allergies and                         sensitivities were reviewed. The patient's tolerance                         of previous anesthesia was reviewed.                        - The risks and benefits of the procedure and the                         sedation options and risks were discussed with the                         patient. All questions were answered and informed                         consent was obtained.                        - Patient identification and proposed procedure were                         verified prior to the procedure by the physician, the                         nurse, the anesthesiologist, the anesthetist and the                         technician. The procedure was verified in the                         procedure room.                        - ASA Grade  Assessment: II - A patient with mild                         systemic disease.                        After obtaining informed consent, the endoscope was                         passed under direct vision. Throughout the procedure,                         the patient's blood  pressure, pulse, and oxygen                         saturations were monitored continuously. The was                         introduced through the mouth, and advanced to the                         second part of duodenum. The upper GI endoscopy was                         accomplished with ease. The patient tolerated the                         procedure well. Findings:      The examined esophagus was normal.      Patchy mildly erythematous mucosa without bleeding was found in the       gastric antrum. Biopsies were taken with a cold forceps for histology.       Biopsies were obtained in the gastric body, at the incisura and in the       gastric antrum with cold forceps for histology and Helicobacter pylori       cultures.      The exam of the stomach was otherwise normal.      The duodenal bulb, second portion of the duodenum and examined duodenum       were normal. Biopsies for histology were taken with a cold forceps for       evaluation of celiac disease. Impression:            - Normal esophagus.                        - Erythematous mucosa in the antrum. Biopsied.                        - Normal duodenal bulb, second portion of the duodenum                         and examined duodenum. Biopsied.                        - Biopsies were obtained in the gastric body, at the                         incisura and in the gastric antrum. Recommendation:        - Await pathology results.                        -  Discharge patient to home (with escort).                        - Advance diet as tolerated.                        - Continue present medications.                        - Patient has a contact number available for                         emergencies. The signs and symptoms of potential                         delayed complications were discussed with the patient.                         Return to normal activities tomorrow. Written                         discharge  instructions were provided to the patient.                        - Discharge patient to home (with escort).                        - The findings and recommendations were discussed with                         the patient.                        - The findings and recommendations were discussed with                         the patient's family. Procedure Code(s):     --- Professional ---                        [phone], Esophagogastroduodenoscopy, flexible,                         transoral; with biopsy, single or multiple Diagnosis Code(s):     --- Professional ---                        K31.89, Other diseases of stomach and duodenum                        R10.9, Unspecified abdominal pain                        U44.03, Helicobacter pylori [H. pylori] as the cause                         of diseases classified elsewhere                        R19.7, Diarrhea, unspecified CPT copyright 2019 American Medical Association. All rights reserved. The codes documented in this report are preliminary and upon coder review may  be revised to meet current compliance requirements.  [name], MD Margretta Sidle B. Bonna Gains MD, MD 11/04/2020 8:25:27 AM This report has been signed electronically. Number of Addenda: 0 Note Initiated On: 11/04/2020 7:56 AM Estimated Blood Loss:  Estimated blood loss: none.      [facility]

## 2020-11-04 NOTE — H&P (Signed)
[name], MD [address], [place], [place], [place], [address] [address], [place], [place], [place], [address] Phone: [phone]  Fax: [phone]  Primary Care Physician:  [name], MD   Pre-Procedure History & Physical: HPI:  Scott Rosario is a 60 y.o. male is here for a colonoscopy and EGD.   Past Medical History:  Diagnosis Date  . Acute calculous cholecystitis   . Alkaline phosphatase elevation   . Aortic aneurysm ([place]) 12/10/2018   3.4 cm March 2020  . Aortic atherosclerosis ([place])    on CT 02/25/14  . Asthma   . CAD (coronary artery disease)   . Cirrhosis ([place])   . Cirrhosis ([place])   . COPD (chronic obstructive pulmonary disease) ([place])   . Diabetes mellitus without complication ([place])   . Emphysema of lung ([place])   . Gallstone 12/10/2018  . Gastritis without bleeding   . GERD (gastroesophageal reflux disease)   . Heart attack ([place])    2005, 2017 (stent after each)  . History of hiatal hernia    small  . History of kidney stones    h/o  . Hyperlipidemia   . Hypertension   . Ingrown toenail of both feet 11/29/2018  . Pneumonia 2018  . Pulmonary nodules    x 3, (70mm, 10mm, 67mm) chest CT February 25, 2014  . Renal cyst    on CT 02/25/14  . Tobacco use     Past Surgical History:  Procedure Laterality Date  . APPENDECTOMY    . CARDIAC CATHETERIZATION Left 03/09/2016   Procedure: Left Heart Cath and Coronary Angiography;  Surgeon: Corey Skains, MD;  Location: [place] [facility];  Service: Cardiovascular;  Laterality: Left;  . CARDIAC CATHETERIZATION N/A 03/09/2016   Procedure: Coronary Stent Intervention;  Surgeon: Isaias Cowman, MD;  Location: [place] [facility];  Service: Cardiovascular;  Laterality: N/A;  . CHOLECYSTECTOMY N/A 01/31/2019   Procedure: LAPAROSCOPIC CHOLECYSTECTOMY - DIABETIC;  Surgeon: Vickie Epley, MD;  Location: [facility];  Service: General;  Laterality: N/A;  . COLONOSCOPY WITH PROPOFOL N/A 03/22/2019    Procedure: COLONOSCOPY WITH PROPOFOL;  Surgeon: Lucilla Lame, MD;  Location: [place];  Service: Endoscopy;  Laterality: N/A;  Diabetic - insulin and oral meds  . CORONARY ANGIOPLASTY WITH STENT PLACEMENT  10/25/2003   x 2  . ESOPHAGOGASTRODUODENOSCOPY (EGD) WITH PROPOFOL N/A 10/24/2017   Procedure: ESOPHAGOGASTRODUODENOSCOPY (EGD) WITH PROPOFOL;  Surgeon: Lucilla Lame, MD;  Location: [place];  Service: Endoscopy;  Laterality: N/A;  Diabetic - oral meds  . ESOPHAGOGASTRODUODENOSCOPY (EGD) WITH PROPOFOL N/A 05/01/2020   Procedure: ESOPHAGOGASTRODUODENOSCOPY (EGD) WITH PROPOFOL;  Surgeon: Virgel Manifold, MD;  Location: [facility];  Service: Endoscopy;  Laterality: N/A;  . POLYPECTOMY  03/22/2019   Procedure: POLYPECTOMY;  Surgeon: Lucilla Lame, MD;  Location: [place];  Service: Endoscopy;;    Prior to Admission medications   Medication Sig Start Date End Date Taking? Authorizing Provider  albuterol (PROVENTIL HFA;VENTOLIN HFA) 108 (90 Base) MCG/ACT inhaler Inhale 2 puffs into the lungs every 4 (four) hours as needed for wheezing or shortness of breath. 12/21/18  Yes Poulose, Bethel Born, NP  ascorbic acid (VITAMIN C) 1000 MG tablet Take 1 tablet by mouth 1 day or 1 dose.   Yes [provider]  aspirin EC 81 MG tablet Take 81 mg by mouth daily.   Yes [provider]  atorvastatin (LIPITOR) 80 MG tablet Take 1 tablet (80 mg total) by mouth  at bedtime. 01/23/20  Yes Delsa Grana, PA-C  folic acid-pyridoxine-cyancobalamin (FOLTX) 2.5-25-2 MG TABS tablet Take 1 tablet by mouth daily.    Yes [provider]  glipiZIDE (GLUCOTROL XL) 5 MG 24 hr tablet Take 1 tablet (5 mg total) by mouth daily with breakfast. Hold if morning sugar <100 or if not eating during the day 07/04/19  Yes Tapia, Leisa, PA-C  Lactobacillus Rhamnosus, GG, (RA PROBIOTIC DIGESTIVE CARE) CAPS Take 1 capsule by mouth daily.    Yes [provider]  losartan (COZAAR)  50 MG tablet Take 1 tablet (50 mg total) by mouth every evening. 03/05/19  Yes Poulose, Bethel Born, NP  metFORMIN (GLUCOPHAGE) 500 MG tablet TAKE 2 TABLETS BY MOUTH TWICE DAILY WITHA MEAL 11/13/19  Yes Hubbard Hartshorn, FNP  metoprolol succinate (TOPROL-XL) 25 MG 24 hr tablet Take 1 tablet (25 mg total) by mouth every morning. 03/05/19  Yes Poulose, Bethel Born, NP  pantoprazole (PROTONIX) 40 MG tablet Take 1 tablet (40 mg total) by mouth 2 (two) times daily for 14 days. 07/29/20 08/12/20 Yes Verneda Hollopeter, Lennette Bihari, MD  umeclidinium-vilanterol (ANORO ELLIPTA) 62.5-25 MCG/INH AEPB Inhale 1 puff into the lungs daily.  02/27/20  Yes [provider]  amoxicillin (AMOXIL) 500 MG capsule Take 1,000 mg by mouth 2 (two) times daily. Patient not taking: Reported on 10/22/2020 10/05/20   [provider]  baclofen (LIORESAL) 10 MG tablet Take 10 mg by mouth daily. Patient not taking: Reported on 10/22/2020 10/04/20   [provider]  Insulin Glargine (LANTUS SOLOSTAR) 100 UNIT/ML Solostar Pen Inject 12 Units into the skin at bedtime. Patient taking differently: Inject 12-14 Units into the skin at bedtime. 07/25/18   Poulose, Bethel Born, NP  Insulin Pen Needle 32G X 4 MM MISC Use as directed with insulin daily 06/06/19   Delsa Grana, PA-C  nitroGLYCERIN (NITROSTAT) 0.4 MG SL tablet Place 1 tablet (0.4 mg total) under the tongue every 5 (five) minutes as needed for chest pain. Max of 3 total; call 911 09/22/17   Arnetha Courser, MD    Allergies as of 10/14/2020 - Review Complete 10/14/2020  Allergen Reaction Noted  . Canagliflozin Other (See Comments) 01/25/2017  . Semaglutide Diarrhea and Nausea And Vomiting 10/19/2017  . Tape  01/25/2019    Family History  Problem Relation Age of Onset  . Diabetes Mother   . Hypertension Mother   . Arthritis Mother   . Hyperlipidemia Mother   . Heart attack Father   . Epilepsy Father   . Diabetes Father   . Seizures Father   . Heart disease Father   .  Hyperlipidemia Father   . Hypertension Father   . Stroke Father   . Diabetes Brother   . Hyperlipidemia Brother   . Hypertension Brother   . Cancer Maternal Grandmother        unknown  . Diabetes Paternal Grandmother   . Heart disease Paternal Grandmother   . Hyperlipidemia Paternal Grandmother   . Hypertension Paternal Grandmother   . Heart attack Paternal Grandfather     Social History   Socioeconomic History  . Marital status: Married    Spouse name: Not on file  . Number of children: Not on file  . Years of education: Not on file  . Highest education level: Not on file  Occupational History  . Not on file  Tobacco Use  . Smoking status: Current Every Day Smoker    Packs/day: 1.00    Years: 45.00  Pack years: 45.00    Types: Cigarettes  . Smokeless tobacco: Never Used  . Tobacco comment: since age 50  Vaping Use  . Vaping Use: Never used  Substance and Sexual Activity  . Alcohol use: No    Alcohol/week: 0.0 standard drinks  . Drug use: No  . Sexual activity: Yes  Other Topics Concern  . Not on file  Social History Narrative   Active and independent at baseline   Social Determinants of Health   Financial Resource Strain: Not on file  Food Insecurity: Not on file  Transportation Needs: Not on file  Physical Activity: Not on file  Stress: Not on file  Social Connections: Not on file  Intimate Partner Violence: Not on file    Review of Systems: See HPI, otherwise negative ROS  Physical Exam: BP (!) 153/91   Pulse 81   Temp 97.9 F (36.6 C) (Temporal)   Ht 5\' 8"  (1.727 m)   Wt 79.4 kg   SpO2 98%   BMI 26.61 kg/m  General:   Alert,  pleasant and cooperative in NAD Head:  Normocephalic and atraumatic. Neck:  Supple; no masses or thyromegaly. Lungs:  Clear throughout to auscultation, normal respiratory effort.    Heart:  +S1, +S2, Regular rate and rhythm, No edema. Abdomen:  Soft, nontender and nondistended. Normal bowel sounds, without guarding,  and without rebound.   Neurologic:  Alert and  oriented x4;  grossly normal neurologically.  Impression/Plan: Scott Rosario is here for a colonoscopy to be performed for diarrhea and EGD for Abdominal pain, history of h pylori  Risks, benefits, limitations, and alternatives regarding the procedures have been reviewed with the patient.  Questions have been answered.  All parties agreeable.   Virgel Manifold, MD  11/04/2020, 7:53 AM

## 2020-11-04 NOTE — Anesthesia Procedure Notes (Signed)
Procedure Name: MAC Date/Time: 11/04/2020 8:11 AM Performed by: Vanetta Shawl, CRNA Pre-anesthesia Checklist: Patient identified, Emergency Drugs available, Suction available, Timeout performed and Patient being monitored Patient Re-evaluated:Patient Re-evaluated prior to induction Oxygen Delivery Method: Nasal cannula Placement Confirmation: positive ETCO2

## 2020-11-04 NOTE — Op Note (Signed)
[facility] Gastroenterology Patient Name: Scott Rosario Procedure Date: 11/04/2020 7:55 AM MRN: [id number] Account #: [id number] Date of Birth: 05-30-1961 Admit Type: Outpatient Age: 60 Room: [facility] OR ROOM 01 Gender: Male Note Status: Finalized Procedure:             Colonoscopy Indications:           Abdominal pain, Clinically significant diarrhea of                         unexplained origin Providers:             Aurilla Coulibaly B. Bonna Gains MD, MD Referring MD:          [name] (Referring MD) Medicines:             Monitored Anesthesia Care Complications:         No immediate complications. Procedure:             Pre-Anesthesia Assessment:                        - ASA Grade Assessment: II - A patient with mild                         systemic disease.                        - Prior to the procedure, a History and Physical was                         performed, and patient medications, allergies and                         sensitivities were reviewed. The patient's tolerance                         of previous anesthesia was reviewed.                        - The risks and benefits of the procedure and the                         sedation options and risks were discussed with the                         patient. All questions were answered and informed                         consent was obtained.                        - Patient identification and proposed procedure were                         verified prior to the procedure by the physician, the                         nurse, the anesthesiologist, the anesthetist and the                         technician. The procedure was verified in  the                         procedure room.                        After obtaining informed consent, the colonoscope was                         passed under direct vision. Throughout the procedure,                         the patient's blood pressure, pulse, and oxygen                          saturations were monitored continuously. The was                         introduced through the anus and advanced to the the                         cecum, identified by appendiceal orifice and ileocecal                         valve. The colonoscopy was performed with ease. The                         patient tolerated the procedure well. The quality of                         the bowel preparation was fair. Findings:      The perianal and digital rectal examinations were normal.      A 10 mm polyp was found in the transverse colon. The polyp was flat. The       polyp was removed with a piecemeal technique using a cold snare.       Resection and retrieval were complete.      A 7 mm polyp was found in the sigmoid colon. The polyp was flat. The       polyp was removed with a cold snare. Resection and retrieval were       complete.      The exam was otherwise without abnormality.      There is no endoscopic evidence of erythema or inflammation in the       entire colon. Biopsies for histology were taken with a cold forceps from       the entire colon for evaluation of microscopic colitis.      The retroflexed view of the distal rectum and anal verge was normal and       showed no anal or rectal abnormalities. Impression:            - Preparation of the colon was fair.                        - One 10 mm polyp in the transverse colon, removed                         piecemeal using a cold snare. Resected and retrieved.                        -  One 7 mm polyp in the sigmoid colon, removed with a                         cold snare. Resected and retrieved.                        - The examination was otherwise normal.                        - The distal rectum and anal verge are normal on                         retroflexion view. Recommendation:        - Await pathology results.                        - Discharge patient to home (with escort).                        -  Advance diet as tolerated.                        - Continue present medications.                        - Repeat colonoscopy date to be determined after                         pending pathology results are reviewed.                        - The findings and recommendations were discussed with                         the patient.                        - The findings and recommendations were discussed with                         the patient's family.                        - Return to primary care physician as previously                         scheduled. Procedure Code(s):     --- Professional ---                        [phone], Colonoscopy, flexible; with removal of                         tumor(s), polyp(s), or other lesion(s) by snare                         technique                        80321, 59, Colonoscopy, flexible; with biopsy, single  or multiple Diagnosis Code(s):     --- Professional ---                        K63.5, Polyp of colon                        R10.9, Unspecified abdominal pain                        R19.7, Diarrhea, unspecified CPT copyright 2019 American Medical Association. All rights reserved. The codes documented in this report are preliminary and upon coder review may  be revised to meet current compliance requirements.  [name], MD Margretta Sidle B. Bonna Gains MD, MD 11/04/2020 9:02:58 AM This report has been signed electronically. Number of Addenda: 0 Note Initiated On: 11/04/2020 7:55 AM Scope Withdrawal Time: 0 hours 25 minutes 39 seconds  Total Procedure Duration: 0 hours 31 minutes 28 seconds  Estimated Blood Loss:  Estimated blood loss: none.      [facility]

## 2020-11-04 NOTE — OR Nursing (Signed)
Confirmation#[id number] for STAT Culture pick for H pylori  Culture and sensitivity

## 2020-11-04 NOTE — Anesthesia Postprocedure Evaluation (Signed)
Anesthesia Post Note  Patient: Scott Rosario  Procedure(s) Performed: COLONOSCOPY WITH PROPOFOL (N/A ) ESOPHAGOGASTRODUODENOSCOPY (EGD) WITH PROPOFOL with biopsy (N/A ) POLYPECTOMY     Patient location during evaluation: PACU Anesthesia Type: General Level of consciousness: awake and alert Pain management: pain level controlled Vital Signs Assessment: post-procedure vital signs reviewed and stable Respiratory status: spontaneous breathing Cardiovascular status: stable Anesthetic complications: no   No complications documented.  Gillian Scarce

## 2020-11-05 ENCOUNTER — Encounter: Payer: Self-pay | Admitting: Gastroenterology

## 2020-11-06 ENCOUNTER — Other Ambulatory Visit: Payer: Self-pay | Admitting: Gastroenterology

## 2020-11-06 ENCOUNTER — Encounter: Payer: Self-pay | Admitting: Gastroenterology

## 2020-11-06 LAB — SURGICAL PATHOLOGY

## 2020-11-08 LAB — AEROBIC CULTURE W GRAM STAIN (SUPERFICIAL SPECIMEN): Culture: NO GROWTH

## 2020-11-09 LAB — ANAEROBIC CULTURE

## 2020-11-18 ENCOUNTER — Telehealth: Payer: Self-pay

## 2020-11-18 NOTE — Telephone Encounter (Signed)
Called and left a message for call back  

## 2020-11-18 NOTE — Telephone Encounter (Signed)
-----   Message from Virgel Manifold, MD sent at 11/06/2020 11:29 AM EST ----- Herb Grays please see letters drafted to the patient.  If he is continuing to have diarrhea, please obtain fecal pancreatic elastase, to evaluate if pancreatic insufficiency is causing his symptoms.  If he continues to have abdominal pain, please prescribe, omeprazole 40 mg twice daily for 4 weeks, no refills.  Please take the amoxicillin, Protonix off his medication list as he is not on it anymore

## 2020-11-26 ENCOUNTER — Telehealth: Payer: Self-pay | Admitting: Gastroenterology

## 2020-11-26 NOTE — Telephone Encounter (Signed)
Patient's wife, Mariann Laster called and asked for call back to go over some results with her.

## 2020-11-27 NOTE — Telephone Encounter (Signed)
Patient's wife, Mariann Laster, calling for call back to discuss her husbands results, please call to advise.

## 2020-11-28 NOTE — Telephone Encounter (Signed)
Called Scott Rosario back and had to leave her a detailed message letting her know to please call me back to answer her question.

## 2020-12-02 NOTE — Telephone Encounter (Signed)
Patient's wife called, asks for call back- [phone]

## 2020-12-04 ENCOUNTER — Telehealth: Payer: Self-pay | Admitting: Gastroenterology

## 2020-12-04 NOTE — Telephone Encounter (Signed)
Patient called asking for call back regarding results.  Patient was upset and frustrated. Please call to advise

## 2020-12-09 NOTE — Telephone Encounter (Signed)
Called Mrs. Collard back and let her know that Mr. Lacasse will need a repeat colonoscopy in 3 years. Mrs. Kozma had no further questions.

## 2020-12-09 NOTE — Telephone Encounter (Signed)
Called patient and patient's wife and they wanted pathology results. They both understood and had no further questions.

## 2020-12-31 ENCOUNTER — Telehealth: Payer: Self-pay | Admitting: Gastroenterology

## 2020-12-31 NOTE — Telephone Encounter (Signed)
Patient's wife called LVM to call her or patient, Scott Rosario,  to advise on next step in treatment.

## 2021-01-05 NOTE — Telephone Encounter (Signed)
Mariann Laster (spouse) called again and lvm. Patient still having a lot of problems. What can the do? Please advise.

## 2021-01-06 NOTE — Telephone Encounter (Signed)
Called Mariann Laster again at [phone] and left her another voicemail to call us back.

## 2021-01-06 NOTE — Telephone Encounter (Signed)
Called patient's wife- Mariann Laster back and had to leave her a voicemail letting her know that she could call me back.

## 2021-01-08 ENCOUNTER — Telehealth: Payer: Self-pay

## 2021-01-08 NOTE — Telephone Encounter (Signed)
Patient's wife called LVM to call her back please. She and her husband want to know what is next step in treatment.

## 2021-01-08 NOTE — Telephone Encounter (Signed)
-----   Message from Virgel Manifold, MD sent at 01/06/2021  1:24 PM EDT ----- Please tell the pt to stop any PPI he is on and obtain H Pylori breath test in 2 weeks. If he has not been on any PPI for atleast 2 weeks lets get H Pylori breath test now. F/u in clinic in 2-4 weeks

## 2021-01-16 ENCOUNTER — Other Ambulatory Visit: Payer: Self-pay

## 2021-01-16 DIAGNOSIS — Z8619 Personal history of other infectious and parasitic diseases: Secondary | ICD-10-CM

## 2021-01-16 NOTE — Telephone Encounter (Signed)
Patient still experiencing gas, nausea, diarrhea, unable to keep food down. Nurse made room for the patient to be seen on April 25th, 2022 @1 :45pm

## 2021-01-16 NOTE — Telephone Encounter (Signed)
Patient's wife was contacted again left her a voicemail to call back.

## 2021-01-19 ENCOUNTER — Ambulatory Visit: Payer: BC Managed Care – PPO | Admitting: Gastroenterology

## 2021-01-19 ENCOUNTER — Other Ambulatory Visit: Payer: Self-pay

## 2021-01-19 ENCOUNTER — Encounter: Payer: Self-pay | Admitting: Gastroenterology

## 2021-01-19 VITALS — BP 171/77 | HR 67 | Temp 98.6°F | Ht 68.0 in | Wt 178.4 lb

## 2021-01-19 DIAGNOSIS — R197 Diarrhea, unspecified: Secondary | ICD-10-CM | POA: Diagnosis not present

## 2021-01-19 DIAGNOSIS — R748 Abnormal levels of other serum enzymes: Secondary | ICD-10-CM | POA: Diagnosis not present

## 2021-01-19 MED ORDER — METAMUCIL 28 % PO PACK: 1.0000 | PACK | Freq: Every day | ORAL | 0 refills | Status: AC

## 2021-01-19 NOTE — Patient Instructions (Signed)
Please take Metamucil daily as discussed by Dr. Bonna Gains

## 2021-01-20 NOTE — Progress Notes (Signed)
Scott Antigua, MD [address]  [place]  [place], [place] [address]  Main: [phone]  Fax: [phone]   Primary Care Physician: Pcp, No   Chief complaint: Diarrhea  HPI: Scott Rosario is a 60 y.o. male with previous history of H. pylori, treated multiple times, with most recent EGD with biopsies not showing any evidence of H. pylori here for follow-up.  Patient's abdominal pain related to the H. pylori that he had previously has resolved.  His current complaints are loose stools. patient states he may not have a bowel movement for 2 to 3 days, but when he does, they are watery or loose and he will have 3 to 4 that day.  No blood in stool..  Biopsies for microscopic colitis were negative.  Fecal Pancreatic elastase was recommended previously, but has not been done yet.  Current Outpatient Medications  Medication Sig Dispense Refill  . albuterol (PROVENTIL HFA;VENTOLIN HFA) 108 (90 Base) MCG/ACT inhaler Inhale 2 puffs into the lungs every 4 (four) hours as needed for wheezing or shortness of breath. 1 Inhaler 1  . amoxicillin (AMOXIL) 500 MG capsule Take 1,000 mg by mouth 2 (two) times daily.    Marland Kitchen ascorbic acid (VITAMIN C) 1000 MG tablet Take 1 tablet by mouth 1 day or 1 dose.    Marland Kitchen aspirin EC 81 MG tablet Take 81 mg by mouth daily.    Marland Kitchen atorvastatin (LIPITOR) 80 MG tablet Take 1 tablet (80 mg total) by mouth at bedtime. 90 tablet 1  . baclofen (LIORESAL) 10 MG tablet Take 10 mg by mouth daily.    . folic acid-pyridoxine-cyancobalamin (FOLTX) 2.5-25-2 MG TABS tablet Take 1 tablet by mouth daily.     Marland Kitchen glipiZIDE (GLUCOTROL XL) 5 MG 24 hr tablet Take 1 tablet (5 mg total) by mouth daily with breakfast. Hold if morning sugar <100 or if not eating during the day 90 tablet 0  . Insulin Glargine (LANTUS SOLOSTAR) 100 UNIT/ML Solostar Pen Inject 12 Units into the skin at bedtime. (Patient taking differently: Inject 12-14 Units into the skin at bedtime.) 15 mL 2  . Insulin Pen  Needle 32G X 4 MM MISC Use as directed with insulin daily 100 each 1  . Lactobacillus Rhamnosus, GG, (RA PROBIOTIC DIGESTIVE CARE) CAPS Take 1 capsule by mouth daily.     Marland Kitchen losartan (COZAAR) 50 MG tablet Take 1 tablet (50 mg total) by mouth every evening. 90 tablet 1  . metFORMIN (GLUCOPHAGE) 500 MG tablet TAKE 2 TABLETS BY MOUTH TWICE DAILY WITHA MEAL 180 tablet 2  . metoprolol succinate (TOPROL-XL) 25 MG 24 hr tablet Take 1 tablet (25 mg total) by mouth every morning. 90 tablet 1  . nitroGLYCERIN (NITROSTAT) 0.4 MG SL tablet Place 1 tablet (0.4 mg total) under the tongue every 5 (five) minutes as needed for chest pain. Max of 3 total; call 911 25 tablet 5  . pantoprazole (PROTONIX) 40 MG tablet Take 1 tablet (40 mg total) by mouth 2 (two) times daily for 14 days. 28 tablet 0  . psyllium (METAMUCIL) 28 % packet Take 1 packet by mouth daily. 30 packet 0  . umeclidinium-vilanterol (ANORO ELLIPTA) 62.5-25 MCG/INH AEPB Inhale 1 puff into the lungs daily.      No current facility-administered medications for this visit.    Allergies as of 01/19/2021 - Review Complete 01/19/2021  Allergen Reaction Noted  . Canagliflozin Other (See Comments) 01/25/2017  . Semaglutide Diarrhea and Nausea And Vomiting 10/19/2017  .  Tape  01/25/2019    ROS:  General: Negative for anorexia, weight loss, fever, chills, fatigue, weakness. ENT: Negative for hoarseness, difficulty swallowing , nasal congestion. CV: Negative for chest pain, angina, palpitations, dyspnea on exertion, peripheral edema.  Respiratory: Negative for dyspnea at rest, dyspnea on exertion, cough, sputum, wheezing.  GI: See history of present illness. GU:  Negative for dysuria, hematuria, urinary incontinence, urinary frequency, nocturnal urination.  Endo: Negative for unusual weight change.    Physical Examination:   BP (!) 171/77   Pulse 67   Temp 98.6 F (37 C) (Oral)   Ht _0  (1.727 m)   Wt 178 lb 6.4 oz (80.9 kg)   BMI 27.13  kg/m   General: Well-nourished, well-developed in no acute distress.  Eyes: No icterus. Conjunctivae pink. Mouth: Oropharyngeal mucosa moist and pink , no lesions erythema or exudate. Neck: Supple, Trachea midline Abdomen: Bowel sounds are normal, nontender, nondistended, no hepatosplenomegaly or masses, no abdominal bruits or hernia , no rebound or guarding.   Extremities: No lower extremity edema. No clubbing or deformities. Neuro: Alert and oriented x 3.  Grossly intact. Skin: Warm and dry, no jaundice.   Psych: Alert and cooperative, normal mood and affect.   Labs: CMP     Component Value Date/Time   NA 138 06/06/2019 0000   NA 138 02/17/2016 0940   K 4.3 06/06/2019 0000   CL 103 06/06/2019 0000   CO2 28 06/06/2019 0000   GLUCOSE 158 (H) 06/06/2019 0000   BUN 7 06/06/2019 0000   BUN 14 02/17/2016 0940   CREATININE 0.75 06/06/2019 0000   CALCIUM 9.3 06/06/2019 0000   PROT 6.0 (L) 06/06/2019 0000   PROT 6.2 02/28/2019 1013   ALBUMIN 4.3 02/28/2019 1013   AST 11 06/06/2019 0000   ALT 15 06/06/2019 0000   ALKPHOS 148 (H) 02/28/2019 1013   BILITOT 1.1 06/06/2019 0000   BILITOT 0.9 02/28/2019 1013   GFRNONAA 100 06/06/2019 0000   GFRAA 116 06/06/2019 0000   Lab Results  Component Value Date   WBC 11.8 (H) 03/10/2020   HGB 14.1 03/10/2020   HCT 40.9 03/10/2020   MCV 85.9 03/10/2020   PLT 215 03/10/2020    Imaging Studies: No results found.  Assessment and Plan:   Scott Rosario is a 60 y.o. y/o male with previous history of H. pylori, previously treated, with recent biopsies not showing any evidence of H. pylori, here for follow-up and reports loose stools  Patient is having 2 to 3 days of no bowel movements, followed by loose stools subsequently  Will start Metamucil to help provide fiber to allow him to have 1-2 soft bowel movements a day, and help bulk stool on the days that he is having loose stools  Check fecal pancreatic elastase  Previous records  also show that patient has had persistently elevated alk phos and was seen by [facility] for this, and liver biopsies were done, with early features of [place] as documented by Dr. Allen Norris in his previous notes.  He was seen by Dr. Manson Allan at [facility].  They wanted to follow-up with him in 6 months after his February 2021 visit.  I discussed this with him and he is willing to follow-up with them.  Referral will be placed.  Dr Scott Rosario

## 2021-01-30 IMAGING — CR CHEST - 2 VIEW
1 series · 2 of 2 positions shown · non-contrast
Comparison: Chest x-ray dated 09/22/2017 and CT scans of the chest
dated 02/24/2017, 03/05/2016 and 02/01/2015

CLINICAL DATA: Chest pain.

EXAM:
CHEST - 2 VIEW

[Series 1: dg chest 2 view · 0.14mm/px · 2 of 2 slices shown]
[im 1/2]
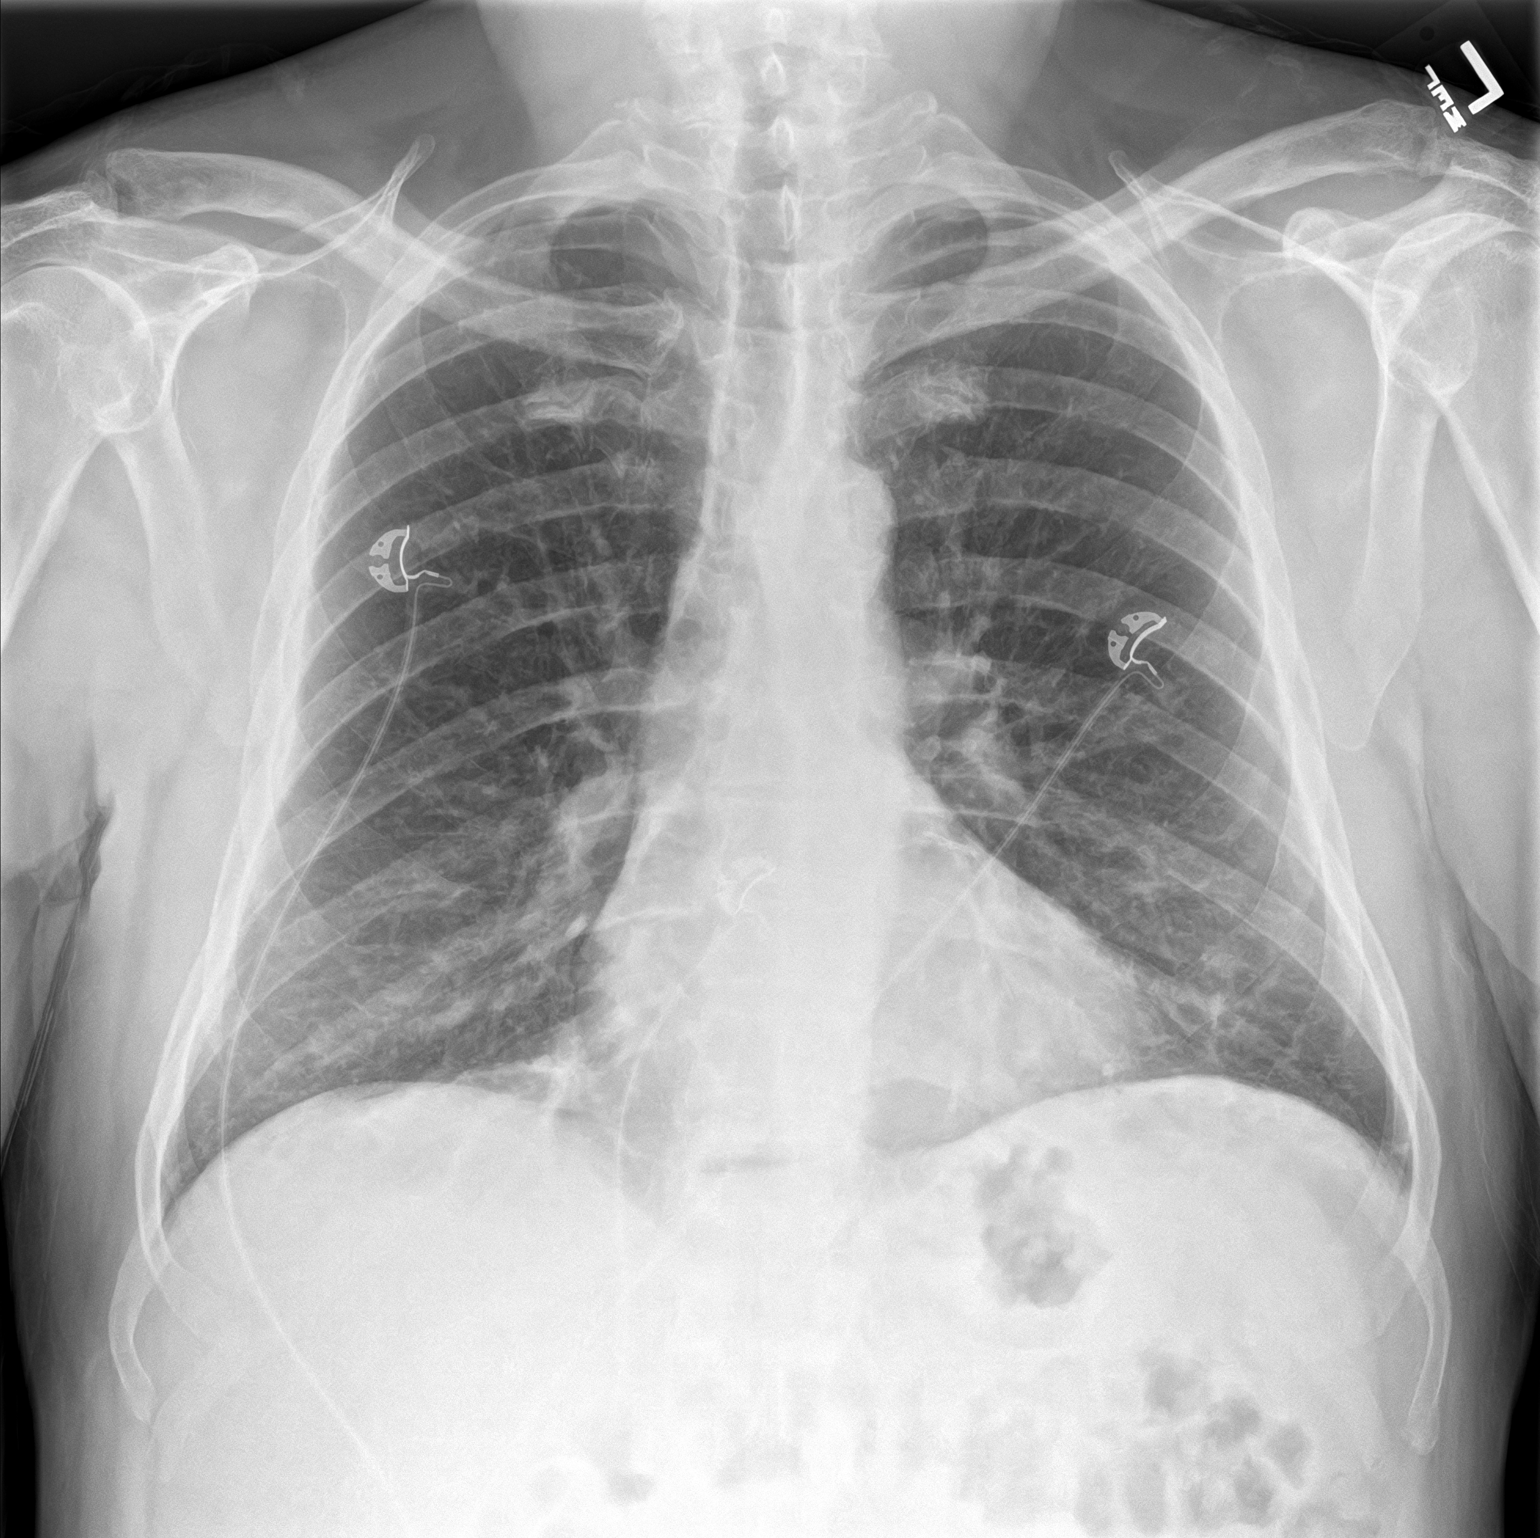
[im 2/2]
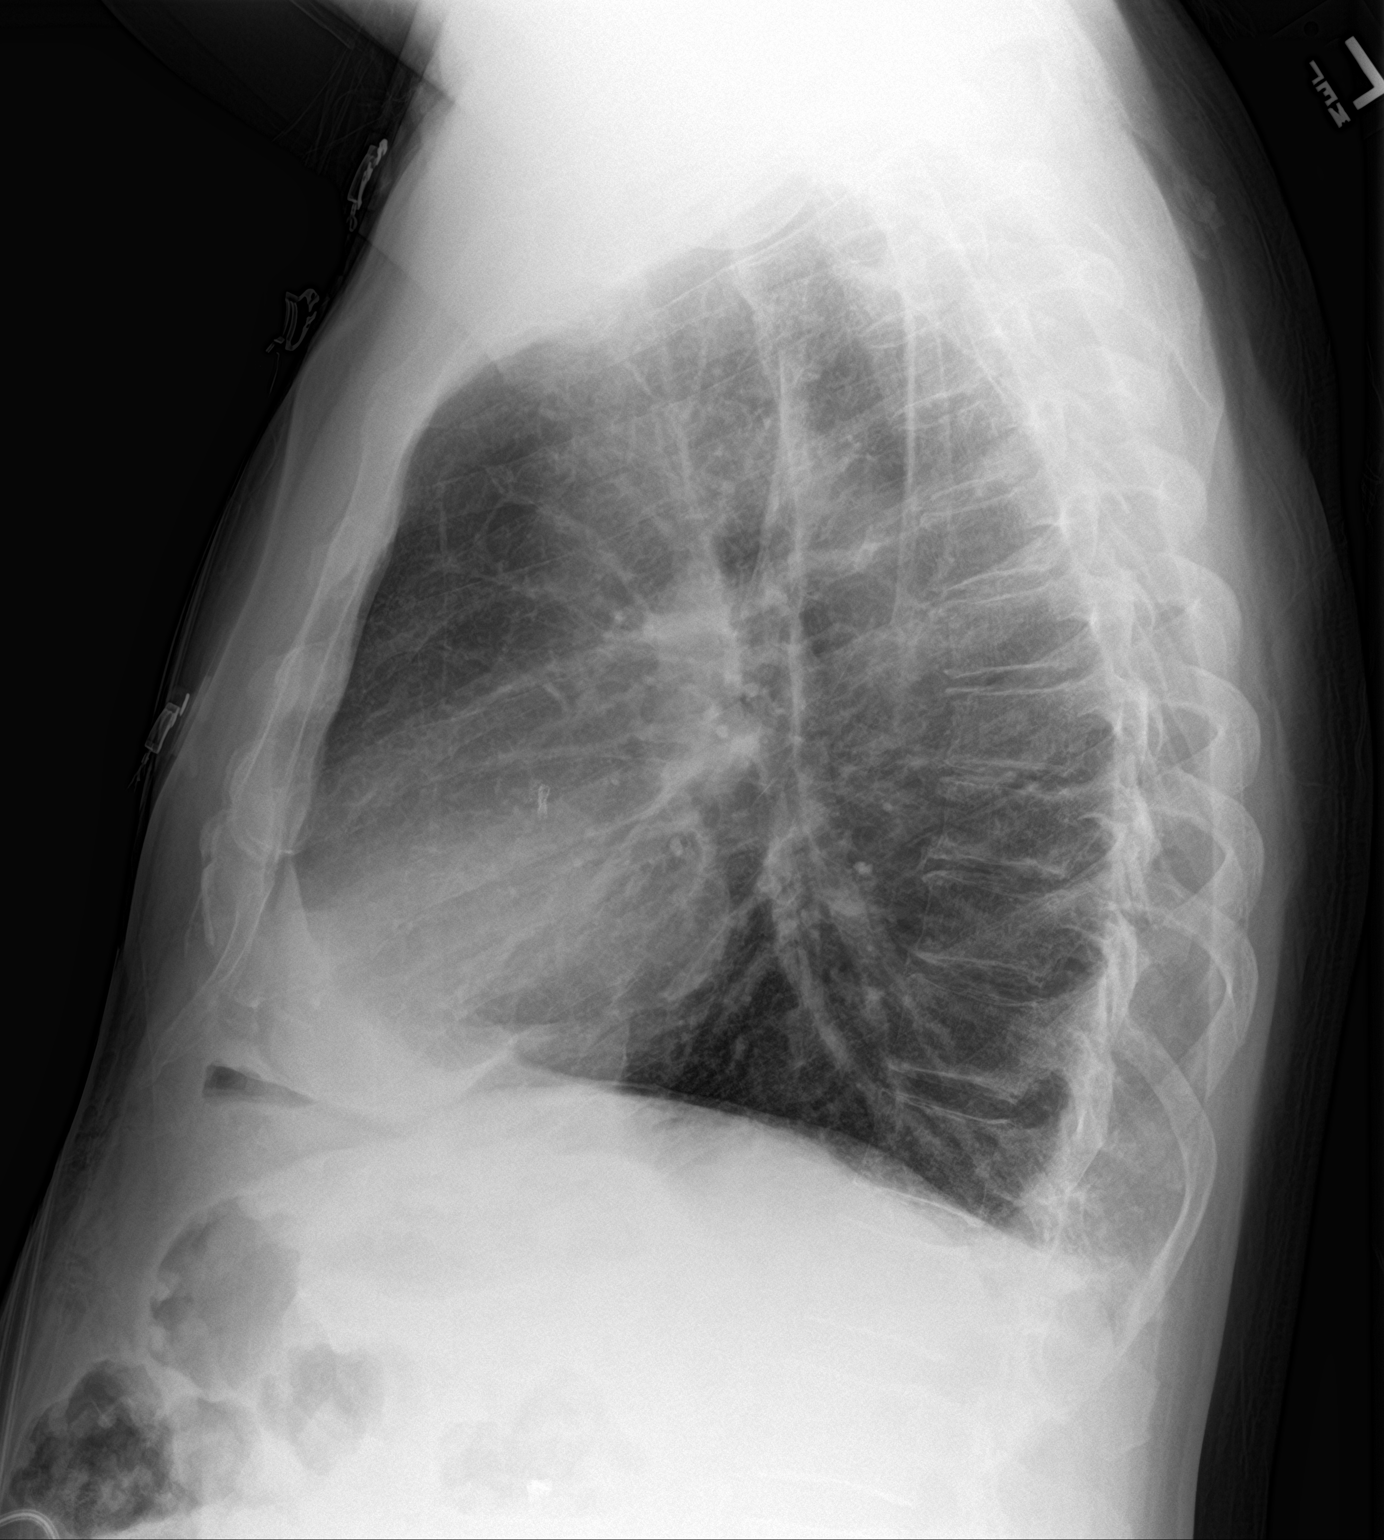

[2 of 2 positions shown; findings below may reference images not displayed]

FINDINGS: The heart size and pulmonary vascularity are normal. Stable small
nodule at the left lung base. No infiltrates or effusions. The lungs
are hyperinflated with flattening of the diaphragm consistent with
emphysema demonstrated on the prior chest CTs. No acute bone
abnormality.
IMPRESSION: No acute abnormality.  Stable small nodule at the left lung base.

Emphysema.

## 2021-04-23 ENCOUNTER — Other Ambulatory Visit: Payer: Self-pay | Admitting: Specialist

## 2021-04-23 DIAGNOSIS — R918 Other nonspecific abnormal finding of lung field: Secondary | ICD-10-CM

## 2021-11-24 ENCOUNTER — Ambulatory Visit: Payer: BC Managed Care – PPO

## 2021-11-24 DIAGNOSIS — J441 Chronic obstructive pulmonary disease with (acute) exacerbation: Secondary | ICD-10-CM | POA: Diagnosis not present

## 2021-12-08 DIAGNOSIS — R0609 Other forms of dyspnea: Secondary | ICD-10-CM | POA: Diagnosis not present

## 2021-12-08 DIAGNOSIS — J441 Chronic obstructive pulmonary disease with (acute) exacerbation: Secondary | ICD-10-CM | POA: Diagnosis not present

## 2021-12-08 DIAGNOSIS — J310 Chronic rhinitis: Secondary | ICD-10-CM | POA: Diagnosis not present

## 2021-12-08 DIAGNOSIS — R052 Subacute cough: Secondary | ICD-10-CM | POA: Diagnosis not present

## 2021-12-15 DIAGNOSIS — E1165 Type 2 diabetes mellitus with hyperglycemia: Secondary | ICD-10-CM | POA: Diagnosis not present

## 2021-12-15 DIAGNOSIS — J441 Chronic obstructive pulmonary disease with (acute) exacerbation: Secondary | ICD-10-CM | POA: Diagnosis not present

## 2021-12-15 DIAGNOSIS — E663 Overweight: Secondary | ICD-10-CM | POA: Diagnosis not present

## 2021-12-15 DIAGNOSIS — I10 Essential (primary) hypertension: Secondary | ICD-10-CM | POA: Diagnosis not present

## 2021-12-25 DIAGNOSIS — J449 Chronic obstructive pulmonary disease, unspecified: Secondary | ICD-10-CM | POA: Diagnosis not present

## 2021-12-25 DIAGNOSIS — R051 Acute cough: Secondary | ICD-10-CM | POA: Diagnosis not present

## 2021-12-25 DIAGNOSIS — J310 Chronic rhinitis: Secondary | ICD-10-CM | POA: Diagnosis not present

## 2021-12-30 DIAGNOSIS — R918 Other nonspecific abnormal finding of lung field: Secondary | ICD-10-CM | POA: Diagnosis not present

## 2022-01-26 DIAGNOSIS — J310 Chronic rhinitis: Secondary | ICD-10-CM | POA: Diagnosis not present

## 2022-01-26 DIAGNOSIS — R0609 Other forms of dyspnea: Secondary | ICD-10-CM | POA: Diagnosis not present

## 2022-01-26 DIAGNOSIS — R918 Other nonspecific abnormal finding of lung field: Secondary | ICD-10-CM | POA: Diagnosis not present

## 2022-01-26 DIAGNOSIS — J449 Chronic obstructive pulmonary disease, unspecified: Secondary | ICD-10-CM | POA: Diagnosis not present

## 2022-03-05 DIAGNOSIS — I10 Essential (primary) hypertension: Secondary | ICD-10-CM | POA: Diagnosis not present

## 2022-03-05 DIAGNOSIS — J449 Chronic obstructive pulmonary disease, unspecified: Secondary | ICD-10-CM | POA: Diagnosis not present

## 2022-03-05 DIAGNOSIS — Z794 Long term (current) use of insulin: Secondary | ICD-10-CM | POA: Diagnosis not present

## 2022-03-05 DIAGNOSIS — E663 Overweight: Secondary | ICD-10-CM | POA: Diagnosis not present

## 2022-03-05 DIAGNOSIS — E1165 Type 2 diabetes mellitus with hyperglycemia: Secondary | ICD-10-CM | POA: Diagnosis not present

## 2022-03-05 DIAGNOSIS — Z125 Encounter for screening for malignant neoplasm of prostate: Secondary | ICD-10-CM | POA: Diagnosis not present

## 2022-03-19 DIAGNOSIS — E1159 Type 2 diabetes mellitus with other circulatory complications: Secondary | ICD-10-CM | POA: Diagnosis not present

## 2022-03-19 DIAGNOSIS — E1129 Type 2 diabetes mellitus with other diabetic kidney complication: Secondary | ICD-10-CM | POA: Diagnosis not present

## 2022-03-19 DIAGNOSIS — R809 Proteinuria, unspecified: Secondary | ICD-10-CM | POA: Diagnosis not present

## 2022-03-19 DIAGNOSIS — E1169 Type 2 diabetes mellitus with other specified complication: Secondary | ICD-10-CM | POA: Diagnosis not present

## 2022-03-31 DIAGNOSIS — M5432 Sciatica, left side: Secondary | ICD-10-CM | POA: Diagnosis not present

## 2022-03-31 DIAGNOSIS — M25552 Pain in left hip: Secondary | ICD-10-CM | POA: Diagnosis not present

## 2022-06-08 DIAGNOSIS — E1165 Type 2 diabetes mellitus with hyperglycemia: Secondary | ICD-10-CM | POA: Diagnosis not present

## 2022-06-08 DIAGNOSIS — J449 Chronic obstructive pulmonary disease, unspecified: Secondary | ICD-10-CM | POA: Diagnosis not present

## 2022-06-08 DIAGNOSIS — Z794 Long term (current) use of insulin: Secondary | ICD-10-CM | POA: Diagnosis not present

## 2022-06-08 DIAGNOSIS — E781 Pure hyperglyceridemia: Secondary | ICD-10-CM | POA: Diagnosis not present

## 2022-06-08 DIAGNOSIS — I10 Essential (primary) hypertension: Secondary | ICD-10-CM | POA: Diagnosis not present

## 2022-06-18 DIAGNOSIS — E1169 Type 2 diabetes mellitus with other specified complication: Secondary | ICD-10-CM | POA: Diagnosis not present

## 2022-06-18 DIAGNOSIS — E1165 Type 2 diabetes mellitus with hyperglycemia: Secondary | ICD-10-CM | POA: Diagnosis not present

## 2022-06-18 DIAGNOSIS — E1129 Type 2 diabetes mellitus with other diabetic kidney complication: Secondary | ICD-10-CM | POA: Diagnosis not present

## 2022-06-18 DIAGNOSIS — E1159 Type 2 diabetes mellitus with other circulatory complications: Secondary | ICD-10-CM | POA: Diagnosis not present

## 2022-06-23 ENCOUNTER — Other Ambulatory Visit: Payer: Self-pay | Admitting: Specialist

## 2022-06-23 DIAGNOSIS — Z794 Long term (current) use of insulin: Secondary | ICD-10-CM | POA: Diagnosis not present

## 2022-06-23 DIAGNOSIS — E1129 Type 2 diabetes mellitus with other diabetic kidney complication: Secondary | ICD-10-CM | POA: Diagnosis not present

## 2022-06-23 DIAGNOSIS — R809 Proteinuria, unspecified: Secondary | ICD-10-CM | POA: Diagnosis not present

## 2022-06-23 DIAGNOSIS — R911 Solitary pulmonary nodule: Secondary | ICD-10-CM

## 2022-07-07 ENCOUNTER — Ambulatory Visit: Admission: RE | Admit: 2022-07-07 | Payer: BC Managed Care – PPO | Source: Ambulatory Visit

## 2022-07-13 ENCOUNTER — Ambulatory Visit
Admission: RE | Admit: 2022-07-13 | Discharge: 2022-07-13 | Disposition: A | Discharge status: Home / Self Care | Payer: BC Managed Care – PPO | Source: Ambulatory Visit | Attending: Specialist | Admitting: Specialist

## 2022-07-13 DIAGNOSIS — R918 Other nonspecific abnormal finding of lung field: Secondary | ICD-10-CM | POA: Diagnosis not present

## 2022-07-13 DIAGNOSIS — I700 Atherosclerosis of aorta: Secondary | ICD-10-CM | POA: Diagnosis not present

## 2022-07-13 DIAGNOSIS — R911 Solitary pulmonary nodule: Secondary | ICD-10-CM

## 2022-07-13 DIAGNOSIS — J439 Emphysema, unspecified: Secondary | ICD-10-CM | POA: Diagnosis not present

## 2022-07-28 DIAGNOSIS — J449 Chronic obstructive pulmonary disease, unspecified: Secondary | ICD-10-CM | POA: Diagnosis not present

## 2022-07-28 DIAGNOSIS — R918 Other nonspecific abnormal finding of lung field: Secondary | ICD-10-CM | POA: Diagnosis not present

## 2022-07-28 DIAGNOSIS — F17200 Nicotine dependence, unspecified, uncomplicated: Secondary | ICD-10-CM | POA: Diagnosis not present

## 2022-07-28 DIAGNOSIS — J301 Allergic rhinitis due to pollen: Secondary | ICD-10-CM | POA: Diagnosis not present

## 2022-09-16 ENCOUNTER — Ambulatory Visit: Payer: BLUE CROSS/BLUE SHIELD | Admitting: Family Medicine

## 2022-09-21 ENCOUNTER — Ambulatory Visit: Payer: BLUE CROSS/BLUE SHIELD | Admitting: Family Medicine

## 2022-11-16 DIAGNOSIS — J449 Chronic obstructive pulmonary disease, unspecified: Secondary | ICD-10-CM | POA: Diagnosis not present

## 2022-11-16 DIAGNOSIS — J208 Acute bronchitis due to other specified organisms: Secondary | ICD-10-CM | POA: Diagnosis not present

## 2022-11-16 DIAGNOSIS — J301 Allergic rhinitis due to pollen: Secondary | ICD-10-CM | POA: Diagnosis not present

## 2022-11-16 DIAGNOSIS — R911 Solitary pulmonary nodule: Secondary | ICD-10-CM | POA: Diagnosis not present

## 2022-11-24 ENCOUNTER — Other Ambulatory Visit: Payer: Self-pay

## 2022-11-24 ENCOUNTER — Emergency Department
Admission: EM | Admit: 2022-11-24 | Discharge: 2022-11-24 | Disposition: A | Discharge status: Home / Self Care | Payer: BC Managed Care – PPO | Attending: Emergency Medicine | Admitting: Emergency Medicine

## 2022-11-24 ENCOUNTER — Emergency Department: Payer: BC Managed Care – PPO

## 2022-11-24 DIAGNOSIS — R030 Elevated blood-pressure reading, without diagnosis of hypertension: Secondary | ICD-10-CM | POA: Diagnosis not present

## 2022-11-24 DIAGNOSIS — Z720 Tobacco use: Secondary | ICD-10-CM | POA: Insufficient documentation

## 2022-11-24 DIAGNOSIS — E119 Type 2 diabetes mellitus without complications: Secondary | ICD-10-CM | POA: Insufficient documentation

## 2022-11-24 DIAGNOSIS — J449 Chronic obstructive pulmonary disease, unspecified: Secondary | ICD-10-CM | POA: Insufficient documentation

## 2022-11-24 DIAGNOSIS — I2510 Atherosclerotic heart disease of native coronary artery without angina pectoris: Secondary | ICD-10-CM | POA: Diagnosis not present

## 2022-11-24 DIAGNOSIS — Z20822 Contact with and (suspected) exposure to covid-19: Secondary | ICD-10-CM | POA: Diagnosis not present

## 2022-11-24 DIAGNOSIS — I10 Essential (primary) hypertension: Secondary | ICD-10-CM | POA: Insufficient documentation

## 2022-11-24 LAB — CBC WITH DIFFERENTIAL/PLATELET
Abs Immature Granulocytes: 0.08 10*3/uL — ABNORMAL HIGH (ref 0.00–0.07)
Basophils Absolute: 0.1 10*3/uL (ref 0.0–0.1)
Basophils Relative: 1 %
Eosinophils Absolute: 0.2 10*3/uL (ref 0.0–0.5)
Eosinophils Relative: 2 %
HCT: 42 % (ref 39.0–52.0)
Hemoglobin: 14 g/dL (ref 13.0–17.0)
Immature Granulocytes: 1 %
Lymphocytes Relative: 14 %
Lymphs Abs: 1.8 10*3/uL (ref 0.7–4.0)
MCH: 28.7 pg (ref 26.0–34.0)
MCHC: 33.3 g/dL (ref 30.0–36.0)
MCV: 86.2 fL (ref 80.0–100.0)
Monocytes Absolute: 1 10*3/uL (ref 0.1–1.0)
Monocytes Relative: 8 %
Neutro Abs: 9.8 10*3/uL — ABNORMAL HIGH (ref 1.7–7.7)
Neutrophils Relative %: 74 %
Platelets: 275 10*3/uL (ref 150–400)
RBC: 4.87 MIL/uL (ref 4.22–5.81)
RDW: 13.3 % (ref 11.5–15.5)
WBC: 13 10*3/uL — ABNORMAL HIGH (ref 4.0–10.5)
nRBC: 0 % (ref 0.0–0.2)

## 2022-11-24 LAB — BASIC METABOLIC PANEL
Anion gap: 7 (ref 5–15)
BUN: 12 mg/dL (ref 8–23)
CO2: 25 mmol/L (ref 22–32)
Calcium: 8.6 mg/dL — ABNORMAL LOW (ref 8.9–10.3)
Chloride: 103 mmol/L (ref 98–111)
Creatinine, Ser: 0.57 mg/dL — ABNORMAL LOW (ref 0.61–1.24)
GFR, Estimated: >60 mL/min (ref >60)
Glucose, Bld: 221 mg/dL — ABNORMAL HIGH (ref 70–99)
Potassium: 3.9 mmol/L (ref 3.5–5.1)
Sodium: 135 mmol/L (ref 135–145)

## 2022-11-24 LAB — RESP PANEL BY RT-PCR (RSV, FLU A&B, COVID)  RVPGX2
Influenza A by PCR: NEGATIVE
Influenza B by PCR: NEGATIVE
Resp Syncytial Virus by PCR: NEGATIVE
SARS Coronavirus 2 by RT PCR: NEGATIVE

## 2022-11-24 LAB — TROPONIN I (HIGH SENSITIVITY): Troponin I (High Sensitivity): 6 ng/L (ref <18)

## 2022-11-24 NOTE — Discharge Instructions (Signed)
Your blood work and CAT scan of your head were normal.  Please follow-up with your outpatient provider for further medication management.  Please try to stay away from over-the-counter cough and cold medicines as many of these will increase your blood pressure.  Please return for any new, worsening, or change in symptoms or other concerns.  Was a pleasure caring for you today.

## 2022-11-24 NOTE — ED Provider Notes (Signed)
[facility] Provider Note    Event Date/Time   First MD Initiated Contact with Patient 11/24/22 1039     (approximate)   History   Hypertension   HPI  Scott Rosario is a 62 y.o. male last medical history of hypertension, hyperlipidemia, coronary artery disease, COPD who presents today for evaluation of elevated blood pressure reading.  Patient reports that he had a cough and cold-like symptoms over the past several days and was taking a lot of the over-the-counter medications which she later found out causes elevated blood pressure readings.  He reports that he has been compliant with his blood pressure medications.  He reports that yesterday his blood pressure was over [id number] systolic, and he had a headache and blurry vision.  He reports that he now feels significantly improved, does not have a headache or chest pain currently.  He reports that he feels "quite relaxed, just sitting here watching TV."  He reports that he has an upcoming appointment with a primary care provider.  Patient Active Problem List   Diagnosis Date Noted   Dyspepsia    Nausea without vomiting    Acute gastric ulcer without hemorrhage or perforation    Liver disease 11/21/2019   Polyp of sigmoid colon    Aortic aneurysm ([place]) 12/10/2018   Overweight (BMI 25.0-29.9) 12/20/2017   Acquired trigger finger 02/15/2017   COPD (chronic obstructive pulmonary disease) ([place])    Diabetes type 2, uncontrolled (HCC)    GERD (gastroesophageal reflux disease)    Hyperlipidemia    Hypertension    Tobacco use    CAD (coronary artery disease)    Aortic atherosclerosis (HCC)    Multiple pulmonary nodules determined by computed tomography of lung 12/31/2014          Physical Exam   Triage Vital Signs: ED Triage Vitals  Enc Vitals Group     BP 11/24/22 0955 (!) 158/94     Pulse Rate 11/24/22 0955 73     Resp 11/24/22 0955 18     Temp 11/24/22 0955 98.3 F (36.8 C)     Temp src --       SpO2 11/24/22 0955 95 %     Weight 11/24/22 0956 178 lb 5.6 oz (80.9 kg)     Height 11/24/22 0956 '5\' 8"'$  (1.727 m)     Head Circumference --      Peak Flow --      Pain Score 11/24/22 0956 0     Pain Loc --      Pain Edu? --      Excl. in [place]? --     Most recent vital signs: Vitals:   11/24/22 0955 11/24/22 1213  BP: (!) 158/94 (!) 151/80  Pulse: 73 74  Resp: 18 18  Temp: 98.3 F (36.8 C)   SpO2: 95% 94%    Physical Exam Vitals and nursing note reviewed.  Constitutional:      General: Awake and alert. No acute distress.    Appearance: Normal appearance. The patient is normal weight.  HENT:     Head: Normocephalic and atraumatic.     Mouth: Mucous membranes are moist.  Eyes:     General: PERRL. Normal EOMs        Right eye: No discharge.        Left eye: No discharge.     Conjunctiva/sclera: Conjunctivae normal.  Cardiovascular:     Rate and Rhythm: Normal rate and regular rhythm.  Pulses: Normal pulses.  Pulmonary:     Effort: Pulmonary effort is normal. No respiratory distress.     Breath sounds: Normal breath sounds.  Abdominal:     Abdomen is soft. There is no abdominal tenderness. No rebound or guarding. No distention. Musculoskeletal:        General: No swelling. Normal range of motion.     Cervical back: Normal range of motion and neck supple.  Skin:    General: Skin is warm and dry.     Capillary Refill: Capillary refill takes less than 2 seconds.     Findings: No rash.  Neurological:     Mental Status: The patient is awake and alert.   Neurological: GCS 15 alert and oriented x3 Normal speech, no expressive or receptive aphasia or dysarthria Cranial nerves II through XII intact Normal visual fields 5 out of 5 strength in all 4 extremities with intact sensation throughout No extremity drift Normal finger-to-nose testing, no limb or truncal ataxia   ED Results / Procedures / Treatments   Labs (all labs ordered are listed, but only abnormal  results are displayed) Labs Reviewed  CBC WITH DIFFERENTIAL/PLATELET - Abnormal; Notable for the following components:      Result Value   WBC 13.0 (*)    Neutro Abs 9.8 (*)    Abs Immature Granulocytes 0.08 (*)    All other components within normal limits  BASIC METABOLIC PANEL - Abnormal; Notable for the following components:   Glucose, Bld 221 (*)    Creatinine, Ser 0.57 (*)    Calcium 8.6 (*)    All other components within normal limits  RESP PANEL BY RT-PCR (RSV, FLU A&B, COVID)  [id number]  TROPONIN I (HIGH SENSITIVITY)  TROPONIN I (HIGH SENSITIVITY)     EKG     RADIOLOGY I independently reviewed and interpreted imaging and agree with radiologists findings.     PROCEDURES:  Critical Care performed:   Procedures   MEDICATIONS ORDERED IN ED: Medications - No data to display   IMPRESSION / MDM / [place] / ED COURSE  I reviewed the triage vital signs and the nursing notes.   Differential diagnosis includes, but is not limited to, elevated blood pressure reading, hypertensive urgency, hypertensive emergency, medication interaction.  Patient is awake and alert, hemodynamically stable and afebrile.  He has a blood pressure of 158/94 which patient reports is his baseline.  EKG demonstrates no acute changes from his previous.  He currently does not have any chest pain.  Labs obtained are overall reassuring and demonstrate no evidence of endorgan failure.  CT head obtained for evaluation of intracranial hemorrhage given his eye hypertension, and this was also negative for any acute findings.  I recommended that he stop taking over-the-counter cough and cold medications as that many of these can increase his blood pressure.  He reports that he feels much better from a cough and cold standpoint.  His COVID/flu/RSV was negative.  I recommended close outpatient follow-up for medication management.  No indication to start new blood pressure medications at this time given  that his blood pressure is controlled, he is currently asymptomatic, and that he was taking medications that increase his blood pressure.  We discussed the risk of dropping his blood pressure too abruptly if we start new medications and he understands and agrees.  We did discuss strict return precautions in the meantime.  Patient understands and agrees to plan.  He was discharged in stable condition.  Patient's presentation is most consistent with acute complicated illness / injury requiring diagnostic workup.     FINAL CLINICAL IMPRESSION(S) / ED DIAGNOSES   Final diagnoses:  Hypertension, unspecified type     Rx / DC Orders   ED Discharge Orders     None        Note:  This document was prepared using Dragon voice recognition software and may include unintentional dictation errors.   Emeline Gins 11/24/22 1737    Vanessa Covington, MD 11/25/22 [phone]

## 2022-11-24 NOTE — ED Triage Notes (Signed)
Pt here with htn, states his 234/167 at the walk in. Pt was told to come to the ED for evaluation. Pt states he takes bp meds and only has head pain at the moment.

## 2023-01-03 DIAGNOSIS — I10 Essential (primary) hypertension: Secondary | ICD-10-CM | POA: Diagnosis not present

## 2023-01-03 DIAGNOSIS — E782 Mixed hyperlipidemia: Secondary | ICD-10-CM | POA: Diagnosis not present

## 2023-01-03 DIAGNOSIS — I700 Atherosclerosis of aorta: Secondary | ICD-10-CM | POA: Diagnosis not present

## 2023-01-03 DIAGNOSIS — I719 Aortic aneurysm of unspecified site, without rupture: Secondary | ICD-10-CM | POA: Diagnosis not present

## 2023-01-10 DIAGNOSIS — E1165 Type 2 diabetes mellitus with hyperglycemia: Secondary | ICD-10-CM | POA: Diagnosis not present

## 2023-01-10 DIAGNOSIS — R1011 Right upper quadrant pain: Secondary | ICD-10-CM | POA: Diagnosis not present

## 2023-01-10 DIAGNOSIS — I10 Essential (primary) hypertension: Secondary | ICD-10-CM | POA: Diagnosis not present

## 2023-01-10 DIAGNOSIS — J449 Chronic obstructive pulmonary disease, unspecified: Secondary | ICD-10-CM | POA: Diagnosis not present

## 2023-01-10 DIAGNOSIS — R197 Diarrhea, unspecified: Secondary | ICD-10-CM | POA: Diagnosis not present

## 2023-01-10 DIAGNOSIS — Z794 Long term (current) use of insulin: Secondary | ICD-10-CM | POA: Diagnosis not present

## 2023-01-10 DIAGNOSIS — E781 Pure hyperglyceridemia: Secondary | ICD-10-CM | POA: Diagnosis not present

## 2023-01-13 DIAGNOSIS — I2510 Atherosclerotic heart disease of native coronary artery without angina pectoris: Secondary | ICD-10-CM

## 2023-01-17 ENCOUNTER — Other Ambulatory Visit: Payer: Self-pay | Admitting: Family

## 2023-01-17 DIAGNOSIS — R748 Abnormal levels of other serum enzymes: Secondary | ICD-10-CM

## 2023-01-17 DIAGNOSIS — R1909 Other intra-abdominal and pelvic swelling, mass and lump: Secondary | ICD-10-CM

## 2023-01-17 DIAGNOSIS — R110 Nausea: Secondary | ICD-10-CM

## 2023-01-17 DIAGNOSIS — R1011 Right upper quadrant pain: Secondary | ICD-10-CM

## 2023-01-19 ENCOUNTER — Ambulatory Visit
Admission: RE | Admit: 2023-01-19 | Discharge: 2023-01-19 | Disposition: A | Discharge status: Home / Self Care | Payer: BC Managed Care – PPO | Source: Ambulatory Visit | Attending: Family | Admitting: Family

## 2023-01-19 DIAGNOSIS — R1031 Right lower quadrant pain: Secondary | ICD-10-CM | POA: Diagnosis not present

## 2023-01-19 DIAGNOSIS — R110 Nausea: Secondary | ICD-10-CM

## 2023-01-19 DIAGNOSIS — R1011 Right upper quadrant pain: Secondary | ICD-10-CM

## 2023-01-19 DIAGNOSIS — R748 Abnormal levels of other serum enzymes: Secondary | ICD-10-CM | POA: Diagnosis not present

## 2023-01-19 DIAGNOSIS — K760 Fatty (change of) liver, not elsewhere classified: Secondary | ICD-10-CM | POA: Diagnosis not present

## 2023-01-19 DIAGNOSIS — R1909 Other intra-abdominal and pelvic swelling, mass and lump: Secondary | ICD-10-CM

## 2023-01-19 MED ORDER — IOPAMIDOL (ISOVUE-300) INJECTION 61%
100.0000 mL | Freq: Once | INTRAVENOUS | Status: AC | PRN
  Administered 2023-01-19: 100 mL via INTRAVENOUS

## 2023-01-20 ENCOUNTER — Encounter: Admission: RE | Payer: Self-pay | Source: Home / Self Care

## 2023-01-20 ENCOUNTER — Ambulatory Visit: Admission: RE | Admit: 2023-01-20 | Payer: BC Managed Care – PPO | Source: Home / Self Care | Admitting: Cardiology

## 2023-01-20 DIAGNOSIS — I2510 Atherosclerotic heart disease of native coronary artery without angina pectoris: Secondary | ICD-10-CM

## 2023-01-20 SURGERY — LEFT HEART CATH AND CORONARY ANGIOGRAPHY
Anesthesia: Moderate Sedation | Laterality: Left

## 2023-01-21 ENCOUNTER — Other Ambulatory Visit: Payer: Self-pay | Admitting: Specialist

## 2023-01-21 DIAGNOSIS — R918 Other nonspecific abnormal finding of lung field: Secondary | ICD-10-CM

## 2023-01-21 DIAGNOSIS — B029 Zoster without complications: Secondary | ICD-10-CM | POA: Diagnosis not present

## 2023-02-01 DIAGNOSIS — I2510 Atherosclerotic heart disease of native coronary artery without angina pectoris: Secondary | ICD-10-CM

## 2023-02-02 ENCOUNTER — Inpatient Hospital Stay: Admission: RE | Admit: 2023-02-02 | Payer: BC Managed Care – PPO | Source: Ambulatory Visit

## 2023-04-21 DIAGNOSIS — R918 Other nonspecific abnormal finding of lung field: Secondary | ICD-10-CM | POA: Diagnosis not present

## 2023-04-21 DIAGNOSIS — J301 Allergic rhinitis due to pollen: Secondary | ICD-10-CM | POA: Diagnosis not present

## 2023-04-21 DIAGNOSIS — J449 Chronic obstructive pulmonary disease, unspecified: Secondary | ICD-10-CM | POA: Diagnosis not present

## 2023-04-22 ENCOUNTER — Other Ambulatory Visit: Payer: Self-pay | Admitting: Specialist

## 2023-04-22 DIAGNOSIS — R918 Other nonspecific abnormal finding of lung field: Secondary | ICD-10-CM

## 2023-05-03 ENCOUNTER — Ambulatory Visit
Admission: RE | Admit: 2023-05-03 | Discharge: 2023-05-03 | Disposition: A | Discharge status: Home / Self Care | Payer: BC Managed Care – PPO | Source: Ambulatory Visit | Attending: Specialist | Admitting: Specialist

## 2023-05-03 DIAGNOSIS — I700 Atherosclerosis of aorta: Secondary | ICD-10-CM | POA: Diagnosis not present

## 2023-05-03 DIAGNOSIS — R918 Other nonspecific abnormal finding of lung field: Secondary | ICD-10-CM

## 2023-06-06 ENCOUNTER — Ambulatory Visit (INDEPENDENT_AMBULATORY_CARE_PROVIDER_SITE_OTHER): Payer: BC Managed Care – PPO | Admitting: Family Medicine

## 2023-06-06 ENCOUNTER — Encounter: Payer: Self-pay | Admitting: Family Medicine

## 2023-06-06 VITALS — BP 160/80 | HR 76 | Temp 96.6°F | Ht 68.0 in | Wt 179.0 lb

## 2023-06-06 DIAGNOSIS — Z794 Long term (current) use of insulin: Secondary | ICD-10-CM

## 2023-06-06 DIAGNOSIS — I2583 Coronary atherosclerosis due to lipid rich plaque: Secondary | ICD-10-CM

## 2023-06-06 DIAGNOSIS — E1169 Type 2 diabetes mellitus with other specified complication: Secondary | ICD-10-CM | POA: Diagnosis not present

## 2023-06-06 DIAGNOSIS — Z7689 Persons encountering health services in other specified circumstances: Secondary | ICD-10-CM

## 2023-06-06 DIAGNOSIS — I2510 Atherosclerotic heart disease of native coronary artery without angina pectoris: Secondary | ICD-10-CM | POA: Diagnosis not present

## 2023-06-06 MED ORDER — GABAPENTIN 300 MG PO CAPS: 300.0000 mg | ORAL_CAPSULE | Freq: Every day | ORAL | 1 refills | Status: DC

## 2023-06-06 MED ORDER — GLIPIZIDE ER 5 MG PO TB24: 5.0000 mg | ORAL_TABLET | Freq: Every day | ORAL | 0 refills | Status: DC

## 2023-06-06 MED ORDER — GVOKE HYPOPEN 2-PACK 1 MG/0.2ML ~~LOC~~ SOAJ: 1.0000 mg | SUBCUTANEOUS | 0 refills | Status: DC | PRN

## 2023-06-06 MED ORDER — METFORMIN HCL ER 750 MG PO TB24: 1500.0000 mg | ORAL_TABLET | Freq: Every day | ORAL | 0 refills | Status: DC

## 2023-06-06 MED ORDER — NITROGLYCERIN 0.4 MG SL SUBL: 0.4000 mg | SUBLINGUAL_TABLET | SUBLINGUAL | 5 refills | Status: AC | PRN

## 2023-06-06 NOTE — Patient Instructions (Addendum)
Thank you for coming to the office today.  Switch the two Diabetes medications today  Switch to XL / XR versions  Glipizide 5mg  XL - once daily with breakfast 30 day trial  Metformin XR 750mg  recommend try 1 pill daily with meal in AM, and then after 1 week, go to 2 pills = x 2 750 = 1500 mg XL daily with meal. The XL should be better tolerated than the regular  For both of these, discontinue or hold the regular versions for now.  --------------------  Labs reviewed 12/2022  We will check full blood panel in Spring 2025.  Next one is only A1c sugar finger stick.    Please schedule a Follow-up Appointment to: Return in about 3 months (around 09/05/2023) for 3 month DM A1c follow-up, DM foot, Urine microalbumin.  If you have any other questions or concerns, please feel free to call the office or send a message through MyChart. You may also schedule an earlier appointment if necessary.  Additionally, you may be receiving a survey about your experience at our office within a few days to 1 week by e-mail or mail. We value your feedback.  Saralyn Pilar, DO [facility], [place]

## 2023-06-06 NOTE — Progress Notes (Signed)
Subjective:    Patient ID: Scott Rosario, male    DOB: 1961-01-09, 62 y.o.   MRN: [id number]  Scott Rosario is a 62 y.o. male presenting on 06/06/2023 for No chief complaint on file.  Previous PCP Dr Juanetta Gosling, Dr Arlana Pouch. 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  HPI  Here to establish care.  Type 2 Diabetes PAD compcation MI Age 71, had MI and diagnosed DM He has been diabetic for 14 yrs  Last result 12/2022, A1c 8.2 CBGs: 150s Meds: Metformin 500mg  x2 = 1000mg  TWICE A DAY, and previously Lantus but was not using it regularly, was up to 12 units but highest range was 20+ Reports  good compliance. Tolerating well w/o side-effects Currently on ARB Denies hypoglycemia, polyuria, visual changes, numbness or tingling.  Currently on Statin therapy. History of higher cholesterol reportedly impacted his liver.   Centrilobular Emphysema Followed by Pulmonology Dr Meredeth Ide Last LDCT 04/2023, resolved LLL nodule.  Shingles flare up, R side He took Valtrex + Gabapentin  Shoulders impact him, heavy lifting.      07/04/2019    8:12 AM 06/06/2019    8:34 AM 03/05/2019    8:47 AM  Depression screen PHQ 2/9  Decreased Interest 0 1 0  Down, Depressed, Hopeless 0 1 0  PHQ - 2 Score 0 2 0  Altered sleeping 0 3 0  Tired, decreased energy 0 2 0  Change in appetite 0 3 0  Feeling bad or failure about yourself  0 1 0  Trouble concentrating 0 2 0  Moving slowly or fidgety/restless 0 0 0  Suicidal thoughts 0 0 0  PHQ-9 Score 0 13 0  Difficult doing work/chores Not difficult at all Somewhat difficult Not difficult at all    Past Medical History:  Diagnosis Date   Acute calculous cholecystitis    Alkaline phosphatase elevation    Aortic aneurysm (HCC) 12/10/2018   3.4 cm March 2020   Aortic atherosclerosis (HCC)    on CT 02/25/14   Asthma    CAD (coronary artery disease)    Cirrhosis (HCC)     Cirrhosis (HCC)    COPD (chronic obstructive pulmonary disease) (HCC)    Diabetes mellitus without complication (HCC)    Emphysema of lung (HCC)    Gallstone 12/10/2018   Gastritis without bleeding    GERD (gastroesophageal reflux disease)    Heart attack (HCC)    2005, 2017 (stent after each)   History of hiatal hernia    small   History of kidney stones    h/o   Hyperlipidemia    Hypertension    Ingrown toenail of both feet 11/29/2018   Pneumonia 2018   Pulmonary nodules    x 3, (5mm, 5mm, 4mm) chest CT February 25, 2014   Renal cyst    on CT 02/25/14   Tobacco use    Past Surgical History:  Procedure Laterality Date   APPENDECTOMY     CARDIAC CATHETERIZATION Left 03/09/2016   Procedure: Left Heart Cath and Coronary Angiography;  Surgeon: Lamar Blinks, MD;  Location: [facility] INVASIVE CV LAB;  Service: Cardiovascular;  Laterality: Left;   CARDIAC CATHETERIZATION N/A 03/09/2016   Procedure: Coronary Stent Intervention;  Surgeon: Marcina Millard, MD;  Location: [facility] INVASIVE CV LAB;  Service: Cardiovascular;  Laterality: N/A;   CHOLECYSTECTOMY N/A 01/31/2019   Procedure: LAPAROSCOPIC CHOLECYSTECTOMY - DIABETIC;  Surgeon: Ancil Linsey, MD;  Location: [facility] ORS;  Service: General;  Laterality: N/A;   COLONOSCOPY WITH PROPOFOL N/A 03/22/2019   Procedure: COLONOSCOPY WITH PROPOFOL;  Surgeon: Midge Minium, MD;  Location: [facility];  Service: Endoscopy;  Laterality: N/A;  Diabetic - insulin and oral meds   COLONOSCOPY WITH PROPOFOL N/A 11/04/2020   Procedure: COLONOSCOPY WITH PROPOFOL;  Surgeon: Pasty Spillers, MD;  Location: [facility];  Service: Endoscopy;  Laterality: N/A;  COVID + 10-15-20   CORONARY ANGIOPLASTY WITH STENT PLACEMENT  10/25/2003   x 2   ESOPHAGOGASTRODUODENOSCOPY (EGD) WITH PROPOFOL N/A 10/24/2017   Procedure: ESOPHAGOGASTRODUODENOSCOPY (EGD) WITH PROPOFOL;  Surgeon: Midge Minium, MD;  Location: [facility];  Service: Endoscopy;  Laterality:  N/A;  Diabetic - oral meds   ESOPHAGOGASTRODUODENOSCOPY (EGD) WITH PROPOFOL N/A 05/01/2020   Procedure: ESOPHAGOGASTRODUODENOSCOPY (EGD) WITH PROPOFOL;  Surgeon: Pasty Spillers, MD;  Location: [facility] ENDOSCOPY;  Service: Endoscopy;  Laterality: N/A;   ESOPHAGOGASTRODUODENOSCOPY (EGD) WITH PROPOFOL N/A 11/04/2020   Procedure: ESOPHAGOGASTRODUODENOSCOPY (EGD) WITH PROPOFOL with biopsy;  Surgeon: Pasty Spillers, MD;  Location: [facility];  Service: Endoscopy;  Laterality: N/A;   POLYPECTOMY  03/22/2019   Procedure: POLYPECTOMY;  Surgeon: Midge Minium, MD;  Location: [facility];  Service: Endoscopy;;   POLYPECTOMY  11/04/2020   Procedure: POLYPECTOMY;  Surgeon: Pasty Spillers, MD;  Location: [facility];  Service: Endoscopy;;   Social History   Socioeconomic History   Marital status: Married    Spouse name: Not on file   Number of children: Not on file   Years of education: Not on file   Highest education level: Not on file  Occupational History   Not on file  Tobacco Use   Smoking status: Every Day    Current packs/day: 1.00    Average packs/day: 1 pack/day for 45.0 years (45.0 ttl pk-yrs)    Types: Cigarettes   Smokeless tobacco: Never   Tobacco comments:    since age 32  Vaping Use   Vaping status: Never Used  Substance and Sexual Activity   Alcohol use: No    Alcohol/week: 0.0 standard drinks of alcohol   Drug use: No   Sexual activity: Yes  Other Topics Concern   Not on file  Social History Narrative   Active and independent at baseline   Social Determinants of Health   Financial Resource Strain: Low Risk  (01/10/2023)   Received from [facility], [facility]   Overall Financial Resource Strain (CARDIA)    Difficulty of Paying Living Expenses: Not hard at all  Food Insecurity: No Food Insecurity (01/10/2023)   Received from [facility], [facility]   Hunger Vital Sign    Worried About Running Out of Food in the Last  Year: Never true    Ran Out of Food in the Last Year: Never true  Transportation Needs: No Transportation Needs (01/10/2023)   Received from [facility], [facility]   [facility] - Transportation    Lack of Transportation (Medical): No    Lack of Transportation (Non-Medical): No  Physical Activity: Not on file  Stress: Not on file  Social Connections: Not on file  Intimate Partner Violence: Not on file   Family History  Problem Relation Age of Onset   Diabetes Mother    Hypertension Mother    Arthritis Mother    Hyperlipidemia Mother    Heart attack Father 55   Epilepsy  Father    Diabetes Father    Seizures Father    Heart disease Father    Hyperlipidemia Father    Hypertension Father    Stroke Father    Healthy Sister    Diabetes Brother    Hyperlipidemia Brother    Hypertension Brother    Heart attack Brother 53   Coronary artery disease Brother    Cancer Maternal Grandmother        unknown   Diabetes Paternal Grandmother    Heart disease Paternal Grandmother    Hyperlipidemia Paternal Grandmother    Hypertension Paternal Grandmother    Heart attack Paternal Grandfather    Current Outpatient Medications on File Prior to Visit  Medication Sig   albuterol (PROVENTIL HFA;VENTOLIN HFA) 108 (90 Base) MCG/ACT inhaler Inhale 2 puffs into the lungs every 4 (four) hours as needed for wheezing or shortness of breath.   ascorbic acid (VITAMIN C) 1000 MG tablet Take 1 tablet by mouth 1 day or 1 dose.   aspirin EC 81 MG tablet Take 81 mg by mouth daily.   atorvastatin (LIPITOR) 80 MG tablet Take 1 tablet (80 mg total) by mouth at bedtime.   baclofen (LIORESAL) 10 MG tablet Take 10 mg by mouth daily.   losartan (COZAAR) 50 MG tablet Take 1 tablet (50 mg total) by mouth every evening.   metoprolol succinate (TOPROL-XL) 25 MG 24 hr tablet Take 1 tablet (25 mg total) by mouth every morning.   umeclidinium-vilanterol (ANORO ELLIPTA) 62.5-25 MCG/INH AEPB Inhale 1 puff into the  lungs daily.    folic acid-pyridoxine-cyancobalamin (FOLTX) 2.5-25-2 MG TABS tablet Take 1 tablet by mouth daily.  (Patient not taking: Reported on 06/06/2023)   Insulin Pen Needle 32G X 4 MM MISC Use as directed with insulin daily (Patient not taking: Reported on 06/06/2023)   pantoprazole (PROTONIX) 40 MG tablet Take 1 tablet (40 mg total) by mouth 2 (two) times daily for 14 days.   [DISCONTINUED] glipiZIDE (GLUCOTROL XL) 5 MG 24 hr tablet Take 1 tablet (5 mg total) by mouth daily with breakfast. Hold if morning sugar <100 or if not eating during the day   No current facility-administered medications on file prior to visit.    Review of Systems Per HPI unless specifically indicated above      Objective:    BP (!) 160/80 (BP Location: Left Arm, Patient Position: Sitting, Cuff Size: Normal)   Pulse 76   Temp (!) 96.6 F (35.9 C) (Temporal)   Ht 5\' 8"  (1.727 m)   Wt 179 lb (81.2 kg)   SpO2 96%   BMI 27.22 kg/m   Wt Readings from Last 3 Encounters:  06/06/23 179 lb (81.2 kg)  11/24/22 178 lb 5.6 oz (80.9 kg)  01/19/21 178 lb 6.4 oz (80.9 kg)    Physical Exam Vitals and nursing note reviewed.  Constitutional:      General: He is not in acute distress.    Appearance: Normal appearance. He is well-developed. He is not diaphoretic.     Comments: Well-appearing, comfortable, cooperative  HENT:     Head: Normocephalic and atraumatic.  Eyes:     General:        Right eye: No discharge.        Left eye: No discharge.     Conjunctiva/sclera: Conjunctivae normal.  Cardiovascular:     Rate and Rhythm: Normal rate.  Pulmonary:     Effort: Pulmonary effort is normal.  Skin:    General: Skin  is warm and dry.     Findings: No erythema or rash.  Neurological:     Mental Status: He is alert and oriented to person, place, and time.  Psychiatric:        Mood and Affect: Mood normal.        Behavior: Behavior normal.        Thought Content: Thought content normal.     Comments: Well  groomed, good eye contact, normal speech and thoughts     I have personally reviewed the radiology report from 05/03/23 on CT Chest.  CT CHEST WO CONTRAST [[id number]] Resulted: 05/08/23 0711  Order Status: Completed Updated: 05/08/23 0713  Narrative:    CLINICAL DATA:  Pulmonary nodule follow-up.  EXAM: CT CHEST WITHOUT CONTRAST  TECHNIQUE: Multidetector CT imaging of the chest was performed following the standard protocol without IV contrast.  RADIATION DOSE REDUCTION: This exam was performed according to the departmental dose-optimization program which includes automated exposure control, adjustment of the mA and/or kV according to patient size and/or use of iterative reconstruction technique.  COMPARISON:  07/13/2022  FINDINGS: Cardiovascular: The heart size is normal. No substantial pericardial effusion. Coronary artery calcification is evident. Mild atherosclerotic calcification is noted in the wall of the thoracic aorta.  Mediastinum/Nodes: No mediastinal lymphadenopathy. No evidence for gross hilar lymphadenopathy although assessment is limited by the lack of intravenous contrast on the current study. The esophagus has normal imaging features. There is no axillary lymphadenopathy.  Lungs/Pleura: Centrilobular and paraseptal emphysema evident. Bullous changes in the apices, right greater than left, similar to prior.  The 4 mm left lower lobe pulmonary identified as new on the previous study has resolved completely in the interval. Additional scattered tiny bilateral pulmonary nodules are stable in the interval (see right middle lobe image 78/3 with 5 mm left lower lobe pulmonary nodule on 139/3. Right middle lobe perifissural nodule on 99/3 is stable.  No new suspicious pulmonary nodule or mass. No focal airspace consolidation. No pleural effusion.  Upper Abdomen: Small cyst again noted upper pole left kidney, stable since 02/24/2017. No followup imaging is  recommended.  Musculoskeletal: No worrisome lytic or sclerotic osseous abnormality.  IMPRESSION: 1. Interval resolution of the 4 mm left lower lobe pulmonary nodule identified as new on the previous study. 2. Additional scattered tiny bilateral pulmonary nodules are stable in the interval. No new suspicious pulmonary nodule or mass. 3. Aortic Atherosclerosis (ICD10-I70.0) and Emphysema (ICD10-J43.9).   Electronically Signed   By: Kennith Center M.D.   On: 05/08/2023 07:11     Results for orders placed or performed during the hospital encounter of 11/24/22  Resp panel by RT-PCR (RSV, Flu A&B, Covid) Anterior Nasal Swab   Specimen: Anterior Nasal Swab  Result Value Ref Range   SARS Coronavirus 2 by RT PCR NEGATIVE NEGATIVE   Influenza A by PCR NEGATIVE NEGATIVE   Influenza B by PCR NEGATIVE NEGATIVE   Resp Syncytial Virus by PCR NEGATIVE NEGATIVE  CBC with Differential  Result Value Ref Range   WBC 13.0 (H) 4.0 - 10.5 K/uL   RBC 4.87 4.22 - 5.81 MIL/uL   Hemoglobin 14.0 13.0 - 17.0 g/dL   HCT 40.9 81.1 - 91.4 %   MCV 86.2 80.0 - 100.0 fL   MCH 28.7 26.0 - 34.0 pg   MCHC 33.3 30.0 - 36.0 g/dL   RDW 78.2 95.6 - 21.3 %   Platelets 275 150 - 400 K/uL   nRBC 0.0 0.0 - 0.2 %  Neutrophils Relative % 74 %   Neutro Abs 9.8 (H) 1.7 - 7.7 K/uL   Lymphocytes Relative 14 %   Lymphs Abs 1.8 0.7 - 4.0 K/uL   Monocytes Relative 8 %   Monocytes Absolute 1.0 0.1 - 1.0 K/uL   Eosinophils Relative 2 %   Eosinophils Absolute 0.2 0.0 - 0.5 K/uL   Basophils Relative 1 %   Basophils Absolute 0.1 0.0 - 0.1 K/uL   Immature Granulocytes 1 %   Abs Immature Granulocytes 0.08 (H) 0.00 - 0.07 K/uL  Basic metabolic panel  Result Value Ref Range   Sodium 135 135 - 145 mmol/L   Potassium 3.9 3.5 - 5.1 mmol/L   Chloride 103 98 - 111 mmol/L   CO2 25 22 - 32 mmol/L   Glucose, Bld 221 (H) 70 - 99 mg/dL   BUN 12 8 - 23 mg/dL   Creatinine, Ser 1.61 (L) 0.61 - 1.24 mg/dL   Calcium 8.6 (L) 8.9 - 10.3  mg/dL   GFR, Estimated >09 >60 mL/min   Anion gap 7 5 - 15  Troponin I (High Sensitivity)  Result Value Ref Range   Troponin I (High Sensitivity) 6 <18 ng/L      Assessment & Plan:   Problem List Items Addressed This Visit     CAD (coronary artery disease) (Chronic)   Relevant Medications   nitroGLYCERIN (NITROSTAT) 0.4 MG SL tablet   Type 2 diabetes mellitus with other specified complication (HCC) - Primary   Relevant Medications   gabapentin (NEURONTIN) 300 MG capsule   GVOKE HYPOPEN 2-PACK 1 MG/0.2ML SOAJ   glipiZIDE (GLUCOTROL XL) 5 MG 24 hr tablet   metFORMIN (GLUCOPHAGE-XR) 750 MG 24 hr tablet   Other Visit Diagnoses     Encounter to establish care with new doctor           Establish care with new PCP  Switch the two Diabetes medications today  Switch to XL / XR versions  Glipizide 5mg  XL - once daily with breakfast 30 day trial  Metformin XR 750mg  recommend try 1 pill daily with meal in AM, and then after 1 week, go to 2 pills = x 2 750 = 1500 mg XL daily with meal. The XL should be better tolerated than the regular  For both of these, discontinue or hold the regular versions for now.  Add GVOKE for emergency hypoglycemia  Future will consider collaboration with clinical pharmacy for management / coverage vs waiting until Medicare  Meds ordered this encounter  Medications   nitroGLYCERIN (NITROSTAT) 0.4 MG SL tablet    Sig: Place 1 tablet (0.4 mg total) under the tongue every 5 (five) minutes as needed for chest pain. Max of 3 total; call 911    Dispense:  25 tablet    Refill:  5   gabapentin (NEURONTIN) 300 MG capsule    Sig: Take 1 capsule (300 mg total) by mouth at bedtime.    Dispense:  90 capsule    Refill:  1   GVOKE HYPOPEN 2-PACK 1 MG/0.2ML SOAJ    Sig: Inject 1 mg into the skin as needed (hypoglycemia).    Dispense:  1 mL    Refill:  0   glipiZIDE (GLUCOTROL XL) 5 MG 24 hr tablet    Sig: Take 1 tablet (5 mg total) by mouth daily with  breakfast.    Dispense:  30 tablet    Refill:  0   metFORMIN (GLUCOPHAGE-XR) 750 MG 24 hr tablet  Sig: Take 2 tablets (1,500 mg total) by mouth daily with breakfast.    Dispense:  60 tablet    Refill:  0     Follow up plan: Return in about 3 months (around 09/05/2023) for 3 month DM A1c follow-up.  Saralyn Pilar, DO [facility] 06/06/2023, 10:29 AM

## 2023-07-12 ENCOUNTER — Other Ambulatory Visit: Payer: Self-pay | Admitting: Family Medicine

## 2023-07-12 DIAGNOSIS — E1169 Type 2 diabetes mellitus with other specified complication: Secondary | ICD-10-CM

## 2023-07-13 NOTE — Telephone Encounter (Signed)
Requested Prescriptions  Pending Prescriptions Disp Refills   metFORMIN (GLUCOPHAGE-XR) 750 MG 24 hr tablet [Pharmacy Med Name: METFORMIN HCL ER 750 MG TAB] 60 tablet 0    Sig: TAKE 2 TABLETS BY MOUTH ONCE DAILY WITH BREAKFAST     Endocrinology:  Diabetes - Biguanides Failed - 07/12/2023  4:48 PM      Failed - Cr in normal range and within 360 days    Creat  Date Value Ref Range Status  06/06/2019 0.75 0.70 - 1.33 mg/dL Final    Comment:    For patients >45 years of age, the reference limit for Creatinine is approximately 13% higher for people identified as African-American. .    Creatinine, Ser  Date Value Ref Range Status  11/24/2022 0.57 (L) 0.61 - 1.24 mg/dL Final   Creatinine, Urine  Date Value Ref Range Status  11/29/2018 237 20 - 320 mg/dL Final         Failed - HBA1C is between 0 and 7.9 and within 180 days    Hgb A1c MFr Bld  Date Value Ref Range Status  06/06/2019 6.5 (H) <5.7 % of total Hgb Final    Comment:    For someone without known diabetes, a hemoglobin A1c value of 6.5% or greater indicates that they may have  diabetes and this should be confirmed with a follow-up  test. . For someone with known diabetes, a value <7% indicates  that their diabetes is well controlled and a value  greater than or equal to 7% indicates suboptimal  control. A1c targets should be individualized based on  duration of diabetes, age, comorbid conditions, and  other considerations. . Currently, no consensus exists regarding use of hemoglobin A1c for diagnosis of diabetes for children. .          Failed - B12 Level in normal range and within 720 days    No results found for: "VITAMINB12"       Passed - eGFR in normal range and within 360 days    GFR, Est African American  Date Value Ref Range Status  06/06/2019 116 > OR = 60 mL/min/1.71m2 Final   GFR, Est Non African American  Date Value Ref Range Status  06/06/2019 100 > OR = 60 mL/min/1.80m2 Final   GFR,  Estimated  Date Value Ref Range Status  11/24/2022 >60 >60 mL/min Final    Comment:    (NOTE) Calculated using the CKD-EPI Creatinine Equation (2021)          Passed - Valid encounter within last 6 months    Recent Outpatient Visits           1 month ago Type 2 diabetes mellitus with other specified complication, with long-term current use of insulin [facility])   [facility] [place], Scott Neat, DO   4 years ago Erectile dysfunction, unspecified erectile dysfunction type   [facility] Scott Berry, PA-C   4 years ago Mixed hyperlipidemia   [facility] Scott Berry, PA-C   4 years ago Essential hypertension   [place] Scott Horsfall, NP   4 years ago Diarrhea, unspecified type   [facility] Scott, [name], MD       Future Appointments             In 2 months [name], Scott Neat, DO [facility], [facility]  Passed - CBC within normal limits and completed in the last 12 months    WBC  Date Value Ref Range Status  11/24/2022 13.0 (H) 4.0 - 10.5 K/uL Final   RBC  Date Value Ref Range Status  11/24/2022 4.87 4.22 - 5.81 MIL/uL Final   Hemoglobin  Date Value Ref Range Status  11/24/2022 14.0 13.0 - 17.0 g/dL Final  09/81/1914 78.2 12.6 - 17.7 g/dL Final   HCT  Date Value Ref Range Status  11/24/2022 42.0 39.0 - 52.0 % Final   Hematocrit  Date Value Ref Range Status  02/17/2016 47.2 37.5 - 51.0 % Final   MCHC  Date Value Ref Range Status  11/24/2022 33.3 30.0 - 36.0 g/dL Final   [facility]  Date Value Ref Range Status  11/24/2022 28.7 26.0 - 34.0 pg Final   MCV  Date Value Ref Range Status  11/24/2022 86.2 80.0 - 100.0 fL Final  02/17/2016 88 79 - 97 fL Final   No results found for: "PLTCOUNTKUC", "LABPLAT", "POCPLA" RDW  Date Value Ref Range Status  11/24/2022 13.3 11.5 - 15.5 %  Final  02/17/2016 13.9 12.3 - 15.4 % Final

## 2023-07-27 ENCOUNTER — Other Ambulatory Visit: Payer: Self-pay | Admitting: Family Medicine

## 2023-07-27 DIAGNOSIS — E1169 Type 2 diabetes mellitus with other specified complication: Secondary | ICD-10-CM

## 2023-07-27 NOTE — Telephone Encounter (Signed)
Requested Prescriptions  Pending Prescriptions Disp Refills   glipiZIDE (GLUCOTROL XL) 5 MG 24 hr tablet [Pharmacy Med Name: GLIPIZIDE ER 5 MG TAB] 30 tablet 2    Sig: TAKE 1 TABLET BY MOUTH ONCE DAILY WITH BREAKFAST     Endocrinology:  Diabetes - Sulfonylureas Failed - 07/27/2023 10:25 AM      Failed - HBA1C is between 0 and 7.9 and within 180 days    Hgb A1c MFr Bld  Date Value Ref Range Status  06/06/2019 6.5 (H) <5.7 % of total Hgb Final    Comment:    For someone without known diabetes, a hemoglobin A1c value of 6.5% or greater indicates that they may have  diabetes and this should be confirmed with a follow-up  test. . For someone with known diabetes, a value <7% indicates  that their diabetes is well controlled and a value  greater than or equal to 7% indicates suboptimal  control. A1c targets should be individualized based on  duration of diabetes, age, comorbid conditions, and  other considerations. . Currently, no consensus exists regarding use of hemoglobin A1c for diagnosis of diabetes for children. .          Failed - Cr in normal range and within 360 days    Creat  Date Value Ref Range Status  06/06/2019 0.75 0.70 - 1.33 mg/dL Final    Comment:    For patients >59 years of age, the reference limit for Creatinine is approximately 13% higher for people identified as African-American. .    Creatinine, Ser  Date Value Ref Range Status  11/24/2022 0.57 (L) 0.61 - 1.24 mg/dL Final   Creatinine, Urine  Date Value Ref Range Status  11/29/2018 237 20 - 320 mg/dL Final         Passed - Valid encounter within last 6 months    Recent Outpatient Visits           1 month ago Type 2 diabetes mellitus with other specified complication, with long-term current use of insulin [facility])   [place] [facility] [place], [name], DO   4 years ago Erectile dysfunction, unspecified erectile dysfunction type   [facility]  [name], PA-C   4 years ago Mixed hyperlipidemia   [facility] [name], PA-C   4 years ago Essential hypertension   [facility] [name], NP   4 years ago Diarrhea, unspecified type   [facility] [name], [name], MD       Future Appointments             In 1 month [name], [name], DO [facility], [facility]

## 2023-08-09 DIAGNOSIS — M7062 Trochanteric bursitis, left hip: Secondary | ICD-10-CM | POA: Diagnosis not present

## 2023-08-09 DIAGNOSIS — M25552 Pain in left hip: Secondary | ICD-10-CM | POA: Diagnosis not present

## 2023-08-09 DIAGNOSIS — I10 Essential (primary) hypertension: Secondary | ICD-10-CM | POA: Diagnosis not present

## 2023-08-09 DIAGNOSIS — G8929 Other chronic pain: Secondary | ICD-10-CM | POA: Diagnosis not present

## 2023-08-22 ENCOUNTER — Other Ambulatory Visit: Payer: Self-pay | Admitting: Family Medicine

## 2023-08-22 DIAGNOSIS — E1169 Type 2 diabetes mellitus with other specified complication: Secondary | ICD-10-CM

## 2023-08-23 NOTE — Telephone Encounter (Signed)
Requested medication (s) are due for refill today:   Yes  Requested medication (s) are on the active medication list:   Yes  Future visit scheduled:   Yes  12/20   Last ordered: 07/13/2023 #60, 0 refills  Unable to refill because A1C due per protocol.      Requested Prescriptions  Pending Prescriptions Disp Refills   metFORMIN (GLUCOPHAGE-XR) 750 MG 24 hr tablet [Pharmacy Med Name: METFORMIN HCL ER 750 MG TAB] 60 tablet 0    Sig: TAKE 2 TABLETS BY MOUTH ONCE DAILY WITH BREAKFAST     Endocrinology:  Diabetes - Biguanides Failed - 08/22/2023 11:27 AM      Failed - Cr in normal range and within 360 days    Creat  Date Value Ref Range Status  06/06/2019 0.75 0.70 - 1.33 mg/dL Final    Comment:    For patients >79 years of age, the reference limit for Creatinine is approximately 13% higher for people identified as African-American. .    Creatinine, Ser  Date Value Ref Range Status  11/24/2022 0.57 (L) 0.61 - 1.24 mg/dL Final   Creatinine, Urine  Date Value Ref Range Status  11/29/2018 237 20 - 320 mg/dL Final         Failed - HBA1C is between 0 and 7.9 and within 180 days    Hgb A1c MFr Bld  Date Value Ref Range Status  06/06/2019 6.5 (H) <5.7 % of total Hgb Final    Comment:    For someone without known diabetes, a hemoglobin A1c value of 6.5% or greater indicates that they may have  diabetes and this should be confirmed with a follow-up  test. . For someone with known diabetes, a value <7% indicates  that their diabetes is well controlled and a value  greater than or equal to 7% indicates suboptimal  control. A1c targets should be individualized based on  duration of diabetes, age, comorbid conditions, and  other considerations. . Currently, no consensus exists regarding use of hemoglobin A1c for diagnosis of diabetes for children. .          Failed - B12 Level in normal range and within 720 days    No results found for: "VITAMINB12"       Passed - eGFR in  normal range and within 360 days    GFR, Est African American  Date Value Ref Range Status  06/06/2019 116 > OR = 60 mL/min/1.62m2 Final   GFR, Est Non African American  Date Value Ref Range Status  06/06/2019 100 > OR = 60 mL/min/1.69m2 Final   GFR, Estimated  Date Value Ref Range Status  11/24/2022 >60 >60 mL/min Final    Comment:    (NOTE) Calculated using the CKD-EPI Creatinine Equation (2021)          Passed - Valid encounter within last 6 months    Recent Outpatient Visits           2 months ago Type 2 diabetes mellitus with other specified complication, with long-term current use of insulin [facility])   [facility] [place], [name], DO   4 years ago Erectile dysfunction, unspecified erectile dysfunction type   [facility] [name], PA-C   4 years ago Mixed hyperlipidemia   [facility] [name], PA-C   4 years ago Essential hypertension   [facility] Sharyon Cable E, NP   4 years ago Diarrhea,  unspecified type   [facility] [name], [name], MD       Future Appointments             In 3 weeks [name], [name], DO [facility]            Passed - CBC within normal limits and completed in the last 12 months    WBC  Date Value Ref Range Status  11/24/2022 13.0 (H) 4.0 - 10.5 K/uL Final   RBC  Date Value Ref Range Status  11/24/2022 4.87 4.22 - 5.81 MIL/uL Final   Hemoglobin  Date Value Ref Range Status  11/24/2022 14.0 13.0 - 17.0 g/dL Final  16/06/9603 54.0 12.6 - 17.7 g/dL Final   HCT  Date Value Ref Range Status  11/24/2022 42.0 39.0 - 52.0 % Final   Hematocrit  Date Value Ref Range Status  02/17/2016 47.2 37.5 - 51.0 % Final   MCHC  Date Value Ref Range Status  11/24/2022 33.3 30.0 - 36.0 g/dL Final   [facility]  Date Value Ref Range Status  11/24/2022 28.7  26.0 - 34.0 pg Final   MCV  Date Value Ref Range Status  11/24/2022 86.2 80.0 - 100.0 fL Final  02/17/2016 88 79 - 97 fL Final   No results found for: "PLTCOUNTKUC", "LABPLAT", "POCPLA" RDW  Date Value Ref Range Status  11/24/2022 13.3 11.5 - 15.5 % Final  02/17/2016 13.9 12.3 - 15.4 % Final

## 2023-09-15 ENCOUNTER — Ambulatory Visit (INDEPENDENT_AMBULATORY_CARE_PROVIDER_SITE_OTHER): Payer: BC Managed Care – PPO | Admitting: Family Medicine

## 2023-09-15 ENCOUNTER — Encounter: Payer: Self-pay | Admitting: Family Medicine

## 2023-09-15 VITALS — BP 150/84 | HR 86 | Ht 68.0 in | Wt 182.0 lb

## 2023-09-15 DIAGNOSIS — J439 Emphysema, unspecified: Secondary | ICD-10-CM

## 2023-09-15 DIAGNOSIS — Z794 Long term (current) use of insulin: Secondary | ICD-10-CM

## 2023-09-15 DIAGNOSIS — E1169 Type 2 diabetes mellitus with other specified complication: Secondary | ICD-10-CM | POA: Diagnosis not present

## 2023-09-15 LAB — POCT GLYCOSYLATED HEMOGLOBIN (HGB A1C): Hemoglobin A1C: 9 % — AB (ref 4.0–5.6)

## 2023-09-15 MED ORDER — GLIPIZIDE ER 5 MG PO TB24: 10.0000 mg | ORAL_TABLET | Freq: Every day | ORAL | 2 refills | Status: DC

## 2023-09-15 MED ORDER — RYBELSUS 3 MG PO TABS: 3.0000 mg | ORAL_TABLET | Freq: Every day | ORAL | Status: DC

## 2023-09-15 MED ORDER — GUAIFENESIN-CODEINE 100-10 MG/5ML PO SOLN: 5.0000 mL | Freq: Three times a day (TID) | ORAL | 0 refills | Status: DC | PRN

## 2023-09-15 NOTE — Progress Notes (Signed)
Subjective:    Patient ID: Scott Rosario, male    DOB: Jan 14, 1961, 62 y.o.   MRN: [id number]  Scott Rosario is a 62 y.o. male presenting on 09/15/2023 for Diabetes   HPI  Discussed the use of AI scribe software for clinical note transcription with the patient, who gave verbal consent to proceed.   Type 2 Diabetes PAD compcation MI A1c 9.0 Metformin XR 750mg  x 2 = 1500mg  daily He has had side effect with GI digestion He stopped the medication and GI diarrhea symptoms resolved. Mornings still had high sugar, afternoon somewhat better No hypoglycemia Reports  good compliance. Tolerating well w/o side-effects Currently on ARB Denies hypoglycemia, polyuria, visual changes, numbness or tingling.   Centrilobular Emphysema Followed by Pulmonology Dr Meredeth Ide Last LDCT 04/2023, resolved LLL nodule.  The patient had been consulting with another health practitioner through [facility] office locally, referred to as Dr. Mayford Knife, who had recommended a supplement purported to help with A1c levels. The patient had been taking this supplement for a month but had not noticed a significant change in symptoms. The patient also reported doubling up on glipizide, as recommended by Dr. Mayford Knife, without experiencing any adverse effects such as hypoglycemia.  In addition to diabetes, the patient reported a hip problem, which was not believed to be related to the hip joint itself. The patient had consulted with an orthopedic specialist, who suggested that the issue might be related to a muscle or nerve. The patient was taking gabapentin for this issue and reported experiencing severe cramps and instances where the hip would "completely go out," causing the patient to have to sit down suddenly.  The patient also mentioned taking a codeine-based cough medicine occasionally, which he found to be effective. The patient expressed a desire to avoid medications that could potentially harm the  liver.  The patient was concerned about the cost of medications, particularly as he was approaching eligibility for Medicare in the next year. He expressed a willingness to try new treatments, including a daily pill or weekly injections, but was concerned about the potential cost of these treatments.            09/15/2023    3:08 PM 07/04/2019    8:12 AM 06/06/2019    8:34 AM  Depression screen PHQ 2/9  Decreased Interest 0 0 1  Down, Depressed, Hopeless 0 0 1  PHQ - 2 Score 0 0 2  Altered sleeping 3 0 3  Tired, decreased energy 2 0 2  Change in appetite 0 0 3  Feeling bad or failure about yourself  0 0 1  Trouble concentrating 0 0 2  Moving slowly or fidgety/restless 0 0 0  Suicidal thoughts 0 0 0  PHQ-9 Score 5 0 13  Difficult doing work/chores  Not difficult at all Somewhat difficult       09/15/2023    3:09 PM  GAD 7 : Generalized Anxiety Score  Nervous, Anxious, on Edge 2  Control/stop worrying 2  Worry too much - different things 2  Trouble relaxing 2  Restless 2  Easily annoyed or irritable 2  Afraid - awful might happen 0  Total GAD 7 Score 12    Social History   Tobacco Use   Smoking status: Every Day    Current packs/day: 1.00    Average packs/day: 1 pack/day for 45.0 years (45.0 ttl pk-yrs)    Types: Cigarettes   Smokeless tobacco: Never   Tobacco comments:  since age 44  Vaping Use   Vaping status: Never Used  Substance Use Topics   Alcohol use: No    Alcohol/week: 0.0 standard drinks of alcohol   Drug use: No    Review of Systems Per HPI unless specifically indicated above     Objective:    BP (!) 162/84   Pulse 86   Ht 5\' 8"  (1.727 m)   Wt 182 lb (82.6 kg)   SpO2 99%   BMI 27.67 kg/m   Wt Readings from Last 3 Encounters:  09/15/23 182 lb (82.6 kg)  06/06/23 179 lb (81.2 kg)  11/24/22 178 lb 5.6 oz (80.9 kg)    Physical Exam Vitals and nursing note reviewed.  Constitutional:      General: He is not in acute distress.     Appearance: Normal appearance. He is well-developed. He is not diaphoretic.     Comments: Well-appearing, comfortable, cooperative  HENT:     Head: Normocephalic and atraumatic.  Eyes:     General:        Right eye: No discharge.        Left eye: No discharge.     Conjunctiva/sclera: Conjunctivae normal.  Cardiovascular:     Rate and Rhythm: Normal rate.  Pulmonary:     Effort: Pulmonary effort is normal.  Skin:    General: Skin is warm and dry.     Findings: No erythema or rash.  Neurological:     Mental Status: He is alert and oriented to person, place, and time.  Psychiatric:        Mood and Affect: Mood normal.        Behavior: Behavior normal.        Thought Content: Thought content normal.     Comments: Well groomed, good eye contact, normal speech and thoughts    Diabetic Foot Exam - Simple   Simple Foot Form Diabetic Foot exam was performed with the following findings: Yes 09/15/2023  2:51 PM  Visual Inspection No deformities, no ulcerations, no other skin breakdown bilaterally: Yes Sensation Testing See comments: Yes Pulse Check Posterior Tibialis and Dorsalis pulse intact bilaterally: Yes Comments Bilateral callus formation, reduced monofilament.      Results for orders placed or performed in visit on 09/15/23  POCT glycosylated hemoglobin (Hb A1C)   Collection Time: 09/15/23  2:25 PM  Result Value Ref Range   Hemoglobin A1C 9.0 (A) 4.0 - 5.6 %   HbA1c POC (<> result, manual entry)     HbA1c, POC (prediabetic range)     HbA1c, POC (controlled diabetic range)        Assessment & Plan:   Problem List Items Addressed This Visit     COPD (chronic obstructive pulmonary disease) (HCC) (Chronic)   Relevant Medications   guaiFENesin-codeine (VIRTUSSIN A/C) 100-10 MG/5ML syrup   Type 2 diabetes mellitus with other specified complication (HCC) - Primary   Relevant Medications   glipiZIDE (GLUCOTROL XL) 5 MG 24 hr tablet   RYBELSUS 3 MG TABS   Other  Relevant Orders   POCT glycosylated hemoglobin (Hb A1C) (Completed)   Urine Microalbumin w/creat. ratio      Type 2 Diabetes Mellitus Elevated A1c (9.0) despite use of Glipizide. Metformin intolerance reported with gastrointestinal side effects. Patient has been taking herbal supplements and has doubled Glipizide dose on the advice of another provider. -Discontinue Metformin due to intolerance GI -Continue Glipizide at double dose. 5 mg XL x 2 = 10mg  XL daily Caution  hypoglycemia -Initiate trial of Rybelsus 3mg  daily on an empty stomach, with a plan to increase dose after one month if tolerated. -Check A1c in 3 months. -Work with clinical pharmacist Gentry Fitz, to ensure appropriate insurance coverage for new medication.  Chronic Cough Patient reports occasional use of codeine-containing cough syrup. -Refill codeine-containing cough syrup for occasional use.  Hip Pain Patient reports hip pain, evaluated by orthopedic specialist who attributed it to muscle or nerve issue. Patient is taking Gabapentin for this issue. -Continue current management with Gabapentin.   -Plan for follow-up visit in March 2025.         Orders Placed This Encounter  Procedures   Urine Microalbumin w/creat. ratio   POCT glycosylated hemoglobin (Hb A1C)    Meds ordered this encounter  Medications   glipiZIDE (GLUCOTROL XL) 5 MG 24 hr tablet    Sig: Take 2 tablets (10 mg total) by mouth daily with breakfast.    Dispense:  60 tablet    Refill:  2   guaiFENesin-codeine (VIRTUSSIN A/C) 100-10 MG/5ML syrup    Sig: Take 5 mLs by mouth 3 (three) times daily as needed for cough.    Dispense:  120 mL    Refill:  0   RYBELSUS 3 MG TABS    Sig: Take 1 tablet (3 mg total) by mouth daily.    Lot Number?:   [id number]    Expiration Date?:   06/26/2024    Quantity:   30    Follow up plan: Return in about 3 months (around 12/14/2023) for 3 month DM  A1c.   Saralyn Pilar, DO [facility] 09/15/2023, 2:25 PM

## 2023-09-15 NOTE — Patient Instructions (Addendum)
Thank you for coming to the office today.  Keep on the Glipizide XL 5mg  x 2 = 10mg  daily, with meal.  Starting dose is 3mg  once daily, first thing in morning on empty stomach, sip of water, no other medicines. Wait 30 min before first meal of day.  After 30 days we need to increase dose up to 7 mg - call us to request new rx.  Go to "Rybelsus" website to go to patient savings. www.[email or web address].com  [email or web address].com/savings-[email or web address]-support.[email or web address]  -----------  Refilled Cough medicine.   Please schedule a Follow-up Appointment to: Return in about 3 months (around 12/14/2023) for 3 month DM  A1c.  If you have any other questions or concerns, please feel free to call the office or send a message through MyChart. You may also schedule an earlier appointment if necessary.  Additionally, you may be receiving a survey about your experience at our office within a few days to 1 week by e-mail or mail. We value your feedback.  Saralyn Pilar, DO [facility], [place]

## 2023-09-16 ENCOUNTER — Ambulatory Visit: Payer: BLUE CROSS/BLUE SHIELD | Admitting: Family Medicine

## 2023-09-16 ENCOUNTER — Telehealth: Payer: Self-pay

## 2023-09-16 NOTE — Progress Notes (Signed)
Care Guide Pharmacy Note  09/16/2023 Name: Scott Rosario MRN: [id number] DOB: Apr 12, 1961  Referred By: Smitty Cords, DO Reason for referral: Care Coordination (Outreach to schedule with Pharm d )   Scott Rosario is a 62 y.o. year old male who is a primary care patient of Smitty Cords, DO.  Scott Rosario was referred to the pharmacist for assistance related to: DMII  Successful contact was made with the patient to discuss pharmacy services including being ready for the pharmacist to call at least 5 minutes before the scheduled appointment time and to have medication bottles and any blood pressure readings ready for review. The patient agreed to meet with the pharmacist via telephone visit on (date/time).09/23/2023  Penne Lash , RMA     [place]  [facility] Guide  Direct Dial: [phone]  Website: [email or web address]

## 2023-09-23 ENCOUNTER — Telehealth: Payer: Self-pay | Admitting: Pharmacist

## 2023-09-23 ENCOUNTER — Other Ambulatory Visit: Payer: BC Managed Care – PPO

## 2023-09-23 NOTE — Telephone Encounter (Signed)
   Outreach Note  09/23/2023 Name: Scott Rosario MRN: [id number] DOB: Oct 17, 1960  Referred by: Smitty Cords, DO  Was unable to reach patient via telephone today and have left HIPAA compliant voicemail asking patient to return my call.    Follow Up Plan: Will collaborate with Care Guide to outreach to reschedule initial call with me  Estelle Grumbles, PharmD, [place]y [place], CPP Clinical Pharmacist [facility] [phone]

## 2023-09-27 ENCOUNTER — Ambulatory Visit: Payer: Self-pay

## 2023-09-27 NOTE — Telephone Encounter (Signed)
  Chief Complaint: Sinus congestion, nose bleeds, HA, and dizziness - pt thinks this is a sinus infection Symptoms: above Frequency: a few days - nose bleeds last 2 nights Pertinent Negatives: Patient denies fever Disposition: [] ED /[] Urgent Care (no appt availability in office) / [x] Appointment(In office/virtual)/ []  [facility]/ [] Home Care/ [] Refused Recommended Disposition /[] [facility]/ []  Follow-up with PCP Additional Notes: Returned pt's call. Pt is currently in [place] . Pt has had nose bleeds the past few nights from left nostril and is very congested with green mucous in right nostril. Pt has cough, some dizziness and HA. He has taken mucinex  with little relief. Pt had a few left over ABX (6-7) from a previous infection that he has taken, but does not think they were helpful.  Appt scheduled for Friday with provider, however, pt would like ABX called in to pharmacy in [place]. Pt will be heading back to here tomorrow. [facility] [address], [place], [place]  [phone]. Appt for Friday needs to be changed to in person. Sent teams message to [employer]. Please advise.   Summary: Dizziness & requesting antibiotic   Pt requesting an antibiotic, pt has had sinus congestion, cough and nose bleeds; also has headaches and dizziness. Pt has had the nose bleeds since last week. Wife states pt has been having nosebleeds every night.  Pt seeking clinical advice- pt out of town right now.  Please call pt to follow up, [phone]     Reason for Disposition  [1] Nasal discharge AND [2] present > 10 days  Answer Assessment - Initial Assessment Questions 1. LOCATION: Where does it hurt?      HA, dizziness Sinus congestion, and nose bleeds 2. ONSET: When did the sinus pain start?  (e.g., hours, days)      Friday 4. RECURRENT SYMPTOM: Have you ever had sinus problems before? If Yes, ask: When was the last time? and What happened that time?      yes 5. NASAL  CONGESTION: Is the nose blocked? If Yes, ask: Can you open it or must you breathe through your mouth?     Yes nose is blocked from bleeds 6. NASAL DISCHARGE: Do you have discharge from your nose? If so ask, What color?     Blood on left, mucous on right 7. FEVER: Do you have a fever? If Yes, ask: What is it, how was it measured, and when did it start?      no 8. OTHER SYMPTOMS: Do you have any other symptoms? (e.g., sore throat, cough, earache, difficulty breathing)     Cough, HA, Dizziness  Protocols used: Sinus Pain or Congestion-A-AH

## 2023-09-30 ENCOUNTER — Encounter: Payer: Self-pay | Admitting: Family Medicine

## 2023-09-30 ENCOUNTER — Telehealth: Payer: BC Managed Care – PPO | Admitting: Family Medicine

## 2023-09-30 ENCOUNTER — Telehealth: Payer: Self-pay

## 2023-09-30 ENCOUNTER — Ambulatory Visit (INDEPENDENT_AMBULATORY_CARE_PROVIDER_SITE_OTHER): Payer: BC Managed Care – PPO | Admitting: Family Medicine

## 2023-09-30 VITALS — BP 170/80 | HR 73 | Ht 68.0 in | Wt 182.0 lb

## 2023-09-30 DIAGNOSIS — J0110 Acute frontal sinusitis, unspecified: Secondary | ICD-10-CM

## 2023-09-30 DIAGNOSIS — I10 Essential (primary) hypertension: Secondary | ICD-10-CM

## 2023-09-30 DIAGNOSIS — J432 Centrilobular emphysema: Secondary | ICD-10-CM

## 2023-09-30 DIAGNOSIS — J209 Acute bronchitis, unspecified: Secondary | ICD-10-CM

## 2023-09-30 DIAGNOSIS — J440 Chronic obstructive pulmonary disease with acute lower respiratory infection: Secondary | ICD-10-CM

## 2023-09-30 MED ORDER — AMLODIPINE BESYLATE 10 MG PO TABS: 10.0000 mg | ORAL_TABLET | Freq: Every day | ORAL | 1 refills | Status: DC

## 2023-09-30 MED ORDER — IPRATROPIUM BROMIDE 0.06 % NA SOLN: 2.0000 | Freq: Four times a day (QID) | NASAL | 0 refills | Status: DC

## 2023-09-30 MED ORDER — AMOXICILLIN-POT CLAVULANATE 875-125 MG PO TABS: 1.0000 | ORAL_TABLET | Freq: Two times a day (BID) | ORAL | 0 refills | Status: DC

## 2023-09-30 MED ORDER — PREDNISONE 10 MG PO TABS: ORAL_TABLET | ORAL | 0 refills | Status: DC

## 2023-09-30 NOTE — Progress Notes (Signed)
 Subjective:    Patient ID: Scott Rosario, male    DOB: 19-Mar-1961, 63 y.o.   MRN: [id number]  Scott Rosario is a 63 y.o. male presenting on 09/30/2023 for Cough (Nose bleeds, congestion)   HPI  Patient presents for a same day appointment.  Discussed the use of AI scribe software for clinical note transcription with the patient, who gave verbal consent to proceed.  History of Present Illness    The patient, with a history of hypertension and COPD, presents with worsening sinus symptoms, including severe headaches, nasal congestion, and dizziness. He also reports spontaneous nocturnal nosebleeds, which have been occurring for several weeks. The patient denies any associated fever. The patient's symptoms began abruptly on Christmas day and have progressively worsened since then.  The patient also reports a worsening cough, which has become more difficult to clear and is causing rib pain due to the frequency and force of the cough. He has been self-managing with over-the-counter Mucinex  and a prescribed cough syrup, which he reports as effective but overly sedating.  The patient's hypertension has been poorly controlled, with multiple high readings over the past two years despite adherence to prescribed losartan . He reports a previous ER visit due to high blood pressure, which was attributed to over-the-counter medication use.  The patient has been using various nasal sprays and ointments to manage his nasal symptoms, including a Vicks salve and a nasal spray containing Afrin. He reports that these treatments have provided some relief but have also contributed to nasal dryness and potentially to the nosebleeds.  The patient is a smoker and acknowledges that this may be contributing to his symptoms and conditions. He expresses a willingness to consider additional or alternative treatments to manage his symptoms and improve his health.   He uses Anoro inhaler. Follows [facility] Dr Theotis for  Pulmonology, interested in new provider.           09/30/2023   10:09 AM 09/15/2023    3:08 PM 07/04/2019    8:12 AM  Depression screen PHQ 2/9  Decreased Interest 0 0 0  Down, Depressed, Hopeless 0 0 0  PHQ - 2 Score 0 0 0  Altered sleeping 3 3 0  Tired, decreased energy 3 2 0  Change in appetite 0 0 0  Feeling bad or failure about yourself  0 0 0  Trouble concentrating 0 0 0  Moving slowly or fidgety/restless 1 0 0  Suicidal thoughts 0 0 0  PHQ-9 Score 7 5 0  Difficult doing work/chores   Not difficult at all       09/30/2023   10:09 AM 09/15/2023    3:09 PM  GAD 7 : Generalized Anxiety Score  Nervous, Anxious, on Edge 0 2  Control/stop worrying 0 2  Worry too much - different things 0 2  Trouble relaxing 0 2  Restless 0 2  Easily annoyed or irritable 0 2  Afraid - awful might happen 0 0  Total GAD 7 Score 0 12    Social History   Tobacco Use   Smoking status: Every Day    Current packs/day: 1.00    Average packs/day: 1 pack/day for 45.0 years (45.0 ttl pk-yrs)    Types: Cigarettes   Smokeless tobacco: Never   Tobacco comments:    since age 36  Vaping Use   Vaping status: Never Used  Substance Use Topics   Alcohol use: No    Alcohol/week: 0.0 standard drinks of alcohol  Drug use: No    Review of Systems Per HPI unless specifically indicated above     Objective:    BP (!) 170/80 (BP Location: Left Arm, Cuff Size: Normal)   Pulse 73   Ht 5' 8 (1.727 m)   Wt 182 lb (82.6 kg)   SpO2 99%   BMI 27.67 kg/m   Wt Readings from Last 3 Encounters:  09/30/23 182 lb (82.6 kg)  09/15/23 182 lb (82.6 kg)  06/06/23 179 lb (81.2 kg)    Physical Exam Vitals and nursing note reviewed.  Constitutional:      General: He is not in acute distress.    Appearance: Normal appearance. He is well-developed. He is not diaphoretic.     Comments: Well-appearing, comfortable, cooperative  HENT:     Head: Normocephalic and atraumatic.     Nose: Congestion present.   Eyes:     General:        Right eye: No discharge.        Left eye: No discharge.     Conjunctiva/sclera: Conjunctivae normal.  Neck:     Thyroid: No thyromegaly.  Cardiovascular:     Rate and Rhythm: Normal rate and regular rhythm.     Pulses: Normal pulses.     Heart sounds: Normal heart sounds. No murmur heard. Pulmonary:     Effort: Pulmonary effort is normal. No respiratory distress.     Breath sounds: No wheezing or rales.     Comments: cough Musculoskeletal:        General: Normal range of motion.     Cervical back: Normal range of motion and neck supple.  Lymphadenopathy:     Cervical: No cervical adenopathy.  Skin:    General: Skin is warm and dry.     Findings: No erythema or rash.  Neurological:     Mental Status: He is alert and oriented to person, place, and time. Mental status is at baseline.  Psychiatric:        Mood and Affect: Mood normal.        Behavior: Behavior normal.        Thought Content: Thought content normal.     Comments: Well groomed, good eye contact, normal speech and thoughts     Results for orders placed or performed in visit on 09/15/23  POCT glycosylated hemoglobin (Hb A1C)   Collection Time: 09/15/23  2:25 PM  Result Value Ref Range   Hemoglobin A1C 9.0 (A) 4.0 - 5.6 %   HbA1c POC (<> result, manual entry)     HbA1c, POC (prediabetic range)     HbA1c, POC (controlled diabetic range)        Assessment & Plan:   Problem List Items Addressed This Visit     COPD (chronic obstructive pulmonary disease) (HCC) (Chronic)   Relevant Medications   predniSONE  (DELTASONE ) 10 MG tablet   ipratropium (ATROVENT ) 0.06 % nasal spray   Other Relevant Orders   Ambulatory referral to Pulmonology   Other Visit Diagnoses       Acute bronchitis with COPD (HCC)    -  Primary   Relevant Medications   amoxicillin -clavulanate (AUGMENTIN ) 875-125 MG tablet   predniSONE  (DELTASONE ) 10 MG tablet   ipratropium (ATROVENT ) 0.06 % nasal spray      Essential hypertension       Relevant Medications   amLODipine  (NORVASC ) 10 MG tablet     Acute non-recurrent frontal sinusitis       Relevant Medications  amoxicillin -clavulanate (AUGMENTIN ) 875-125 MG tablet   predniSONE  (DELTASONE ) 10 MG tablet   ipratropium (ATROVENT ) 0.06 % nasal spray         Sinusitis  Presents with headaches, nasal congestion, and spontaneous nocturnal epistaxis. No fever reported. Nasal tissue appears dry. Likely exacerbated by overuse of Afrin-containing nasal spray.  -Start Augmentin  for sinusitis. -Add Prednisone  if coughing persists. -Use Atrovent  nasal spray for congestion and swelling, with caution due to potential drying effect. -Apply Vaseline or Neosporin ointment inside the nose with a Q-tip or use saline nasal spray without medicine to prevent dryness.  Epistaxis likely from aspirin  and current sinus symptoms. Caution with nasal sprays that dry out sinuses  COPD He has history of emphysema and active smoker. He is followed by [facility] Dr Theotis, on maintenance therapy limited results reported, on Anoro. Interested in other options and new provider. Some of his current symptoms exacerbated by his COPD but no distinct wheezing right now.  Hypertension Persistent high blood pressure readings over the past several months since he has been established here, despite current medication (Losartan  50mg  twice daily). -Add Amlodipine  10mg  daily to current regimen. -Continue Losartan  as prescribed.  Follow-up Patient to keep the office updated on progress and to report any issues with the new treatment plan.       Orders Placed This Encounter  Procedures   Ambulatory referral to Pulmonology    Referral Priority:   Routine    Referral Type:   Consultation    Referral Reason:   Specialty Services Required    Requested Specialty:   Pulmonary Disease    Number of Visits Requested:   1    Meds ordered this encounter  Medications    amoxicillin -clavulanate (AUGMENTIN ) 875-125 MG tablet    Sig: Take 1 tablet by mouth 2 (two) times daily.    Dispense:  20 tablet    Refill:  0   predniSONE  (DELTASONE ) 10 MG tablet    Sig: Take 6 tabs with breakfast Day 1, 5 tabs Day 2, 4 tabs Day 3, 3 tabs Day 4, 2 tabs Day 5, 1 tab Day 6.    Dispense:  21 tablet    Refill:  0   amLODipine  (NORVASC ) 10 MG tablet    Sig: Take 1 tablet (10 mg total) by mouth daily.    Dispense:  90 tablet    Refill:  1   ipratropium (ATROVENT ) 0.06 % nasal spray    Sig: Place 2 sprays into both nostrils 4 (four) times daily. For up to 5-7 days then stop.    Dispense:  15 mL    Refill:  0    Follow up plan: Return if symptoms worsen or fail to improve.   Marsa Officer, DO [facility] 09/30/2023, 9:50 AM

## 2023-09-30 NOTE — Telephone Encounter (Signed)
 Copied from CRM [id number]. Topic: Referral - Request for Referral >> Sep 27, 2023  8:47 AM Tobias CROME wrote: Did the patient discuss referral with their provider in the last year? Yes  Appointment offered? No  Type of order/referral and detailed reason for visit: Pulmonologist for COPD and nose bleeds.   Preference of office, provider, location: Anywhere but [facility] clinic  If referral order, have you been seen by this specialty before? Yes (If Yes, this issue or another issue? When? Where? Pt was seeing Dr. Kinnie Servant but is requesting to see a different pulmonologist.   Can we respond through MyChart? Yes

## 2023-09-30 NOTE — Patient Instructions (Addendum)
 Thank you for coming to the office today.  Elevated BP New order Amlodipine  10mg  daily Keep on current BP med  Sinus / COPD flare Start with Augmentin  antibiotic Add Prednisone  if difficulty with coughing still for lungs.  If nasal congestion swelling Start Atrovent  nasal spray decongestant 2 sprays in each nostril up to 4 times daily for 7 days Caution it will dry the nose out  So use vaseline ointment or neosporin inside the nose with Q-tip or nasal saline with no medicine.   Please schedule a Follow-up Appointment to: Return if symptoms worsen or fail to improve.  If you have any other questions or concerns, please feel free to call the office or send a message through MyChart. You may also schedule an earlier appointment if necessary.  Additionally, you may be receiving a survey about your experience at our office within a few days to 1 week by e-mail or mail. We value your feedback.  Marsa Officer, DO [facility], [place]

## 2023-09-30 NOTE — Telephone Encounter (Signed)
 Patient seen today 09/30/23

## 2023-09-30 NOTE — Telephone Encounter (Signed)
Patient was seen in office today and referral was placed

## 2023-10-05 ENCOUNTER — Telehealth: Payer: Self-pay

## 2023-10-05 NOTE — Progress Notes (Signed)
 Complex Care Management Care Guide Note  10/05/2023 Name: Scott Rosario MRN: [id number] DOB: 1961/02/04  Scott Rosario is a 63 y.o. year old male who is a primary care patient of Edman Marsa PARAS, DO and is actively engaged with the care management team. I reached out to [name] by phone today to assist with re-scheduling  with the Pharmacist.  Follow up plan: Unsuccessful telephone outreach attempt made. A HIPAA compliant phone message was left for the patient providing contact information and requesting a return call.  [name] , RMA     [facility]  [facility], [facility] Guide  Direct Dial: [phone]  Website: [email or web address]

## 2023-10-12 NOTE — Progress Notes (Signed)
 Complex Care Management Care Guide Note  10/12/2023 Name: Scott Rosario MRN: [id number] DOB: 05-20-1961  Scott Rosario is a 63 y.o. year old male who is a primary care patient of Raina Bunting, DO and is actively engaged with the care management team. I reached out to Scott Rosario by phone today to assist with re-scheduling  with the Pharmacist.  Follow up plan: Telephone appointment with complex care management team member scheduled for:  10/31/2023  Scott Rosario , RMA     [place]  [facility], [facility] Guide  Direct Dial: [phone]  Website: [email or web address]

## 2023-10-20 DIAGNOSIS — Z720 Tobacco use: Secondary | ICD-10-CM | POA: Diagnosis not present

## 2023-10-20 DIAGNOSIS — J449 Chronic obstructive pulmonary disease, unspecified: Secondary | ICD-10-CM | POA: Diagnosis not present

## 2023-10-20 DIAGNOSIS — R918 Other nonspecific abnormal finding of lung field: Secondary | ICD-10-CM | POA: Diagnosis not present

## 2023-10-20 DIAGNOSIS — J301 Allergic rhinitis due to pollen: Secondary | ICD-10-CM | POA: Diagnosis not present

## 2023-10-31 ENCOUNTER — Other Ambulatory Visit (HOSPITAL_COMMUNITY): Payer: Self-pay

## 2023-10-31 ENCOUNTER — Encounter: Payer: Self-pay | Admitting: Pharmacist

## 2023-10-31 ENCOUNTER — Telehealth: Payer: Self-pay

## 2023-10-31 ENCOUNTER — Other Ambulatory Visit: Payer: Self-pay

## 2023-10-31 ENCOUNTER — Other Ambulatory Visit: Payer: BC Managed Care – PPO | Admitting: Pharmacist

## 2023-10-31 DIAGNOSIS — Z794 Long term (current) use of insulin: Secondary | ICD-10-CM

## 2023-10-31 DIAGNOSIS — E1169 Type 2 diabetes mellitus with other specified complication: Secondary | ICD-10-CM

## 2023-10-31 DIAGNOSIS — J432 Centrilobular emphysema: Secondary | ICD-10-CM

## 2023-10-31 MED ORDER — TRULICITY 0.75 MG/0.5ML ~~LOC~~ SOAJ
0.7500 mg | SUBCUTANEOUS | 0 refills | Status: DC
  Filled 2023-10-31: qty 2, 28d supply, fill #0

## 2023-10-31 MED ORDER — TRULICITY 0.75 MG/0.5ML ~~LOC~~ SOAJ: 0.7500 mg | SUBCUTANEOUS | 0 refills | Status: DC

## 2023-10-31 NOTE — Telephone Encounter (Signed)
Pharmacy Patient Advocate Encounter  Insurance verification completed.    The patient is insured through [facility]. Patient has [employer], may use a copay card, and/or apply for patient assistance if available.    Ran test claim for [facility] and the current 30 day co-pay is $25.00 with manufacturer copay card.  Ran test claim for Trulicity and the current 30 day co-pay is $25.00 with manufacturer copay card.  Ran test claim for Stiolto and the current 30 day co-pay is $35.00 with manufacturer copay card.  Ran test claim for [facility] and the current 30 day co-pay is $35.00 with manufacturer copay card.  This test claim was processed through [facility]- copay amounts may vary at other pharmacies due to pharmacy/plan contracts, or as the patient moves through the different stages of their insurance plan.

## 2023-10-31 NOTE — Progress Notes (Unsigned)
10/31/2023 Name: Scott Rosario MRN: [id number] DOB: 27-Oct-1960  Chief Complaint  Patient presents with   Medication Assistance   Medication Management    Scott Rosario is a 63 y.o. year old male who presented for a telephone visit.   They were referred to the pharmacist by their PCP for assistance in managing diabetes and medication access.    Subjective:  Care Team: Primary Care Provider: Smitty Cords, DO ; Next Scheduled Visit: 12/16/2023 Pulmonologist: Mertie Moores, MD    Medication Access/Adherence  Current Pharmacy:  [facility], [place] - [address] [place] [address] Phone: [phone] Fax: [phone]   Patient reports affordability concerns with their medications: Yes  Patient reports access/transportation concerns to their pharmacy: No  Patient reports adherence concerns with their medications:  No     Diabetes:  Current medications:  - metformin ER 750 mg twice daily - glipizide ER 5 mg - 2 tablets daily with breakfast  Medications tried in the past: Reports stopped Rybelsus as did not tolerate well (nausea); insulin glargine; Invokana  Current glucose readings: 122-160 - Reports using glucose checks as feedback on dietary choices  Current dietary habits: - Breakfast: fried egg and sometimes bacon or egg sandwich with cheese - Lunch: Salad or meat and veggies (green beans or cabbage) - Supper: grilled chicken with broccoli or green beans - Dessert: typically skips - Snacks: typically none  - Drinks: Diet [employer] and unsweetened tea; little water   Reports working on weight loss  Patient denies hypoglycemic s/sx including dizziness, shakiness, sweating.   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 Mayo Ao recently prescribed Anoro inhaler, but reports cost was unaffordable. Reports currently using what he has left of his Trelegy inhaler    Tobacco Abuse:  Smokes ~1 pack/day  Reports helps with stress control  Previous therapies tried: nicotine patch (did not find to be effective)  Reports would like to quit in the future   Hypertension:  Current medications:  - amlodipine 10 mg daily - losartan 50 mg twice daily  Patient has an automated, upper arm home BP cuff Current blood pressure readings readings: recalls last checked last week and reading ~130/74   Patient denies hypotensive s/sx including dizziness, lightheadedness.   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     Objective:  Lab Results  Component Value Date   HGBA1C 9.0 (A) 09/15/2023    Lab Results  Component Value Date   CREATININE 0.57 (L) 11/24/2022   BUN 12 11/24/2022   NA 135 11/24/2022   K 3.9 11/24/2022   CL 103 11/24/2022   CO2 25 11/24/2022    Lab Results  Component Value Date   CHOL 86 06/06/2019   HDL 29 (L) 06/06/2019   LDLCALC 39 06/06/2019   TRIG 96 06/06/2019   CHOLHDL 3.0 06/06/2019   BP Readings from Last 3 Encounters:  09/30/23 (!) 170/80  09/15/23 (!) 150/84  06/06/23 (!) 160/80   Pulse Readings from Last 3 Encounters:  09/30/23 73  09/15/23 86  06/06/23 76     Current Outpatient Medications on File Prior to Visit  Medication Sig Dispense Refill   albuterol (PROVENTIL HFA;VENTOLIN HFA) 108 (90  Base) MCG/ACT inhaler Inhale 2 puffs into the lungs every 4 (four) hours as needed for wheezing or shortness of breath. 1 Inhaler 1   glipiZIDE (GLUCOTROL XL) 5 MG 24 hr tablet Take 2 tablets (10 mg total) by mouth daily with breakfast. 60 tablet 2   metFORMIN (GLUCOPHAGE-XR) 750 MG 24 hr tablet TAKE 2 TABLETS BY MOUTH ONCE DAILY WITH BREAKFAST (Patient taking differently: Take 750 mg by mouth 2 (two) times daily.)  180 tablet 1   pantoprazole (PROTONIX) 40 MG tablet Take 1 tablet (40 mg total) by mouth 2 (two) times daily for 14 days. 28 tablet 0   amLODipine (NORVASC) 10 MG tablet Take 1 tablet (10 mg total) by mouth daily. 90 tablet 1   ascorbic acid (VITAMIN C) 1000 MG tablet Take 1 tablet by mouth 1 day or 1 dose.     aspirin EC 81 MG tablet Take 81 mg by mouth daily.     atorvastatin (LIPITOR) 80 MG tablet Take 1 tablet (80 mg total) by mouth at bedtime. 90 tablet 1   baclofen (LIORESAL) 10 MG tablet Take 10 mg by mouth daily.     folic acid-pyridoxine-cyancobalamin (FOLTX) 2.5-25-2 MG TABS tablet Take 1 tablet by mouth daily.     gabapentin (NEURONTIN) 300 MG capsule Take 1 capsule (300 mg total) by mouth at bedtime. 90 capsule 1   GVOKE HYPOPEN 2-PACK 1 MG/0.2ML SOAJ Inject 1 mg into the skin as needed (hypoglycemia). 1 mL 0   Insulin Pen Needle 32G X 4 MM MISC Use as directed with insulin daily 100 each 1   ipratropium (ATROVENT) 0.06 % nasal spray Place 2 sprays into both nostrils 4 (four) times daily. For up to 5-7 days then stop. 15 mL 0   losartan (COZAAR) 50 MG tablet Take 1 tablet (50 mg total) by mouth every evening. (Patient taking differently: Take 50 mg by mouth 2 (two) times daily.) 90 tablet 1   metoprolol succinate (TOPROL-XL) 25 MG 24 hr tablet Take 1 tablet (25 mg total) by mouth every morning. 90 tablet 1   nitroGLYCERIN (NITROSTAT) 0.4 MG SL tablet Place 1 tablet (0.4 mg total) under the tongue every 5 (five) minutes as needed for chest pain. Max of 3 total; call 911 25 tablet 5   umeclidinium-vilanterol (ANORO ELLIPTA) 62.5-25 MCG/INH AEPB Inhale 1 puff into the lungs daily.  (Patient not taking: Reported on 10/31/2023)     [DISCONTINUED] glipiZIDE (GLUCOTROL XL) 5 MG 24 hr tablet Take 1 tablet (5 mg total) by mouth daily with breakfast. Hold if morning sugar <100 or if not eating during the day 90 tablet 0   No current facility-administered medications on file prior to visit.         Assessment/Plan:   Recommend patient to start taking pantoprazole morning dose ~30 minutes before breakfast instead of after  Unable to complete comprehensive medication review today as patient is not currently home   Diabetes: - Currently uncontrolled - Reviewed long term cardiovascular and renal outcomes of uncontrolled blood sugar - Reviewed goal A1c, goal fasting, and goal 2 hour post prandial glucose - Reviewed dietary modifications including importance of having regular well-balanced meals and snacks throughout the day, while controlling carbohydrate portion sizes - Discuss diabetes management options. Patient interested in trying an alternative GLP-1 Receptor Agonist option if affordable  Collaborate with CPhT Monica Becton for supports with evaluating cost of medication options.  Advises that based on test claim, cost of Trulicity for patient (with manufacturer  copay savings card) would be $25 for 1 month supply Follow up with patient to provide update. Discuss patient would need to monitor for GI intolerance given history of nausea with semaglutide. Patient verbalizes understanding and is interested in starting Trulicity  Discuss strategies to aid with tolerability - Counseled on GLP1 agonists, including mechanism of action, side effects, and benefits. No personal or family history of medullary thyroid cancer, personal history of pancreatitis or gallbladder disease. Counseled on potential side effects of nausea, stomach upset, queasiness, constipation, and that these generally improve over time. Advised to contact our office with more severe symptoms, including nausea, diarrhea, stomach pain. Patient verbalized understanding. - CPP sends prescription for Trulicity 0.75 mg weekly to pharmacy for patient  As requested, send patient MyChart message with link to "how to use" video for this device - Recommend to check glucose, keep log of results and have this record to review  at upcoming medical appointments. Patient to contact provider office sooner if needed for readings outside of established parameters or symptoms   COPD:  Current medications: Medications tried in the past:  Collaborate with CPhT Monica Becton for supports with evaluating cost of medication options.  Advises that based on test claim, cost of Stiolto (therapeutic alternative to Anoro) for patient (with manufacturer copay savings card) would be $25 for 1 month supply    Tobacco Abuse - Provided motivational interviewing to assess tobacco use and strategies for reduction - Provide counseling on ***  Hypertension: - Reviewed long term cardiovascular and renal outcomes of uncontrolled blood pressure - Reviewed appropriate blood pressure monitoring technique and reviewed goal blood pressure. - Recommended to check home blood pressure and heart rate, keep log of results and have this record to review at upcoming medical appointments. Patient to contact provider office sooner if needed for readings outside of established parameters or symptoms   Follow Up Plan: Clinical Pharmacist will follow up with patient by telephone on 11/18/2023 at 10:30 AM   Estelle Grumbles, PharmD, [facility] [phone]

## 2023-11-02 ENCOUNTER — Other Ambulatory Visit: Payer: Self-pay

## 2023-11-08 ENCOUNTER — Other Ambulatory Visit: Payer: Self-pay | Admitting: Family Medicine

## 2023-11-08 DIAGNOSIS — Z794 Long term (current) use of insulin: Secondary | ICD-10-CM

## 2023-11-09 ENCOUNTER — Other Ambulatory Visit: Payer: Self-pay | Admitting: Pharmacist

## 2023-11-09 ENCOUNTER — Encounter: Payer: Self-pay | Admitting: Pharmacist

## 2023-11-09 DIAGNOSIS — J432 Centrilobular emphysema: Secondary | ICD-10-CM

## 2023-11-09 NOTE — Patient Instructions (Signed)
I received a message back from Dr. Reita Cliche office. He sent a prescription for the Stiolto Respimat inhaler to [facility] for you.   Please start Stiolto in place of Trelegy or Anoro.    I have provided [facility] with a savings card for Stiolto from the manufacturer and your cost should now be $35 per month.   The following is the "how to use" video from the manufacturer for the Stiolto:   [email or web address]://patient.[employer].[email or web address]/products/stiolto/how-to-use-stiolto-respimat   Please copy and paste this website into your internet browser to review the video.   Thank you!   Estelle Grumbles, PharmD, [place] [place], CPP Clinical Pharmacist [facility] [phone]

## 2023-11-09 NOTE — Progress Notes (Signed)
   11/09/2023  Patient ID: Scott Rosario, male   DOB: [birth year], 63 y.o.   MRN: [id number]  Receive a call back from [facility] with [facility] advising that Dr. Meredeth Ide was okay to switch patient from [facility] to [facility] inhaler for cost savings to patient and new prescription was sent to [facility].  Follow up with Angelica Chessman at [facility] today. Provide Stiolto copay savings card from manufacturer. Mandy processes savings card and confirms patient's cost is now $35 for 1 month supply  Was unable to reach patient via telephone today to provide update. Left HIPAA compliant voicemail asking patient to return my call. Will also send patient a MyChart message.  Follow Up Plan: Clinical Pharmacist will follow up with patient by telephone on 11/18/2023 at 10:30 AM    Estelle Grumbles, PharmD, [facility] [phone]

## 2023-11-09 NOTE — Telephone Encounter (Signed)
Requested Prescriptions  Pending Prescriptions Disp Refills   gabapentin (NEURONTIN) 300 MG capsule [Pharmacy Med Name: GABAPENTIN 300 MG CAP] 90 capsule 0    Sig: TAKE 1 CAPSULE BY MOUTH AT BEDTIME     Neurology: Anticonvulsants - gabapentin Failed - 11/09/2023 10:08 AM      Failed - Cr in normal range and within 360 days    Creat  Date Value Ref Range Status  06/06/2019 0.75 0.70 - 1.33 mg/dL Final    Comment:    For patients >65 years of age, the reference limit for Creatinine is approximately 13% higher for people identified as African-American. .    Creatinine, Ser  Date Value Ref Range Status  11/24/2022 0.57 (L) 0.61 - 1.24 mg/dL Final   Creatinine, Urine  Date Value Ref Range Status  11/29/2018 237 20 - 320 mg/dL Final         Passed - Completed PHQ-2 or PHQ-9 in the last 360 days      Passed - Valid encounter within last 12 months    Recent Outpatient Visits           1 month ago Acute bronchitis with COPD [facility])   [place] [facility] [place], [name], DO   1 month ago Type 2 diabetes mellitus with other specified complication, with long-term current use of insulin [facility])   [place] [facility] [place], [name], DO   5 months ago Type 2 diabetes mellitus with other specified complication, with long-term current use of insulin [facility])   [place] [facility] [place], [name], DO   4 years ago Erectile dysfunction, unspecified erectile dysfunction type   [facility] [name], PA-C   4 years ago Mixed hyperlipidemia   [facility] [name], PA-C       Future Appointments             In 1 month [name], [name], DO [place] [facility], [facility]

## 2023-11-18 ENCOUNTER — Other Ambulatory Visit: Payer: Self-pay

## 2023-11-18 ENCOUNTER — Other Ambulatory Visit: Payer: BC Managed Care – PPO | Admitting: Pharmacist

## 2023-11-18 DIAGNOSIS — E1169 Type 2 diabetes mellitus with other specified complication: Secondary | ICD-10-CM

## 2023-11-18 DIAGNOSIS — Z7985 Type 2 diabetes mellitus without complications: Secondary | ICD-10-CM

## 2023-11-18 DIAGNOSIS — Z794 Long term (current) use of insulin: Secondary | ICD-10-CM

## 2023-11-18 MED ORDER — GLIPIZIDE ER 5 MG PO TB24: 5.0000 mg | ORAL_TABLET | Freq: Every day | ORAL | 0 refills | Status: DC

## 2023-11-18 MED ORDER — TRULICITY 0.75 MG/0.5ML ~~LOC~~ SOAJ
0.7500 mg | SUBCUTANEOUS | 0 refills | Status: DC
Start: 2023-11-18 — End: 2023-11-18

## 2023-11-18 MED ORDER — TRULICITY 0.75 MG/0.5ML ~~LOC~~ SOAJ
0.7500 mg | SUBCUTANEOUS | 0 refills | Status: DC
  Filled 2023-11-18: qty 2, 28d supply, fill #0

## 2023-11-18 NOTE — Patient Instructions (Addendum)
Goals Addressed             This Visit's Progress    Pharmacy Goals       Please continue to monitor your home blood sugar and give our office a call if you have any new symptoms or readings outside of the ranges that we discussed, particularly for any signs of low blood sugar.    Please feel free to give me a call sooner for any medication-related questions   Thank you!   Estelle Grumbles, PharmD, [place], CPP Clinical Pharmacist [facility] [phone]

## 2023-11-18 NOTE — Progress Notes (Signed)
11/18/2023 Name: Scott Rosario MRN: [id number] DOB: 18-Jan-1961  Chief Complaint  Patient presents with   Medication Assistance   Medication Management    Scott Rosario is a 63 y.o. year old male who presented for a telephone visit.   They were referred to the pharmacist by their PCP for assistance in managing diabetes and medication access.      Subjective:   Care Team: Primary Care Provider: Smitty Cords, DO ; Next Scheduled Visit: 12/16/2023 Pulmonologist: Mertie Moores, MD    Medication Access/Adherence  Current Pharmacy:  [facility], [place] - [address] [place] [address] Phone: [phone] Fax: [phone]  [facility] [address] [place] [address] Phone: [phone] Fax: [phone]   Diabetes:   Current medications:  - metformin ER 750 mg twice daily - glipizide ER 10 mg - 1 tablet daily with breakfast - Trulicity 0.75 mg weekly on Wednesdays (started on 11/09/2023)  Reports had some upset stomach after first dose, but now tolerating well   Medications tried in the past: Reports stopped Rybelsus as did not tolerate well (nausea); insulin glargine; Invokana   Current glucose readings: morning 105-160; before supper: 77-150 - Reports using glucose checks as feedback on dietary choices    Reports working on weight loss   Patient denies hypoglycemic s/sx including dizziness, shakiness, sweating.  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        COPD:   Current medications: - Stiolto Respimat - 2 puff daily - albuterol HFA - 2 puffs every 4 hours as needed for wheezing or shortness of breath   Medications tried in the past: Anoro (cost), Trelegy   Reports breathing doing well with Stiolto        Tobacco Abuse: Smokes ~1 pack/day   Reports helps with stress control   Previous therapies tried: nicotine patch (did not find to be effective)   Reports working on cutting back, but not ready to start now - Strategies: using straw to keep in mouth and sugar free gum - Occasionally using "zero nicotine" vape     Hypertension:   Current medications:  - amlodipine 10 mg daily - losartan 50 mg twice daily   Patient has an automated, upper arm home BP cuff Recalls home blood pressure readings ranging: 120-130/75-85    Patient denies hypotensive s/sx including dizziness, lightheadedness.    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   Objective:  Lab Results  Component Value Date   HGBA1C 9.0 (A) 09/15/2023    Lab Results  Component Value Date   CREATININE 0.57 (L) 11/24/2022   BUN 12 11/24/2022   NA 135 11/24/2022   K 3.9 11/24/2022   CL 103 11/24/2022   CO2 25 11/24/2022    Lab Results  Component Value Date   CHOL 86 06/06/2019   HDL 29 (L) 06/06/2019   LDLCALC 39 06/06/2019   TRIG 96 06/06/2019   CHOLHDL 3.0 06/06/2019   BP Readings from Last 3 Encounters:  09/30/23 (!) 170/80  09/15/23 (!) 150/84  06/06/23 (!) 160/80   Pulse Readings from Last 3 Encounters:  09/30/23 73  09/15/23 86  06/06/23 76     Medications Reviewed Today     Reviewed by Ronney Asters,  Jackelyn Poling, RPH-CPP (Pharmacist) on 11/18/23 at 1429  Med List Status: <None>   Medication Order Taking? Sig Documenting Provider Last Dose Status Informant  albuterol (PROVENTIL HFA;VENTOLIN HFA) 108 (90 Base) MCG/ACT inhaler [id number]  Inhale 2 puffs into the lungs every 4 (four) hours as needed for wheezing or shortness of breath. [name], NP  Active Self  amLODipine (NORVASC) 10 MG tablet [id number]  Take 1 tablet (10 mg total) by mouth daily. Smitty Cords, DO  Active   ascorbic acid (VITAMIN C) 1000 MG tablet [id number]  Take 1 tablet by mouth  1 day or 1 dose. [provider]  Active   aspirin EC 81 MG tablet [id number]  Take 81 mg by mouth daily. [provider]  Active Self  atorvastatin (LIPITOR) 80 MG tablet [id number]  Take 1 tablet (80 mg total) by mouth at bedtime. [name], PA-C  Active   baclofen (LIORESAL) 10 MG tablet [id number]  Take 10 mg by mouth daily. [provider]  Active   Dulaglutide (TRULICITY) 0.75 MG/0.5ML SOAJ [id number]  Inject 0.75 mg into the skin once a week for 4 doses. Smitty Cords, DO  Active   folic acid-pyridoxine-cyancobalamin (FOLTX) 2.5-25-2 MG TABS tablet [id number]  Take 1 tablet by mouth daily. [provider]  Active   gabapentin (NEURONTIN) 300 MG capsule [id number]  TAKE 1 CAPSULE BY MOUTH AT BEDTIME Smitty Cords, DO  Active     Discontinued 06/06/23 1011 (Change in therapy)   glipiZIDE (GLUCOTROL XL) 5 MG 24 hr tablet [id number] Yes Take 1 tablet (5 mg total) by mouth daily with breakfast. Smitty Cords, DO  Active   GVOKE HYPOPEN 2-PACK 1 MG/0.2ML Ivory Broad [id number]  Inject 1 mg into the skin as needed (hypoglycemia). Smitty Cords, DO  Active   Insulin Pen Needle 32G X 4 MM MISC [id number]  Use as directed with insulin daily [name], PA-C  Active   ipratropium (ATROVENT) 0.06 % nasal spray [id number]  Place 2 sprays into both nostrils 4 (four) times daily. For up to 5-7 days then stop. Karamalegos, [name], DO  Active   losartan (COZAAR) 50 MG tablet [id number]  Take 1 tablet (50 mg total) by mouth every evening.  Patient taking differently: Take 50 mg by mouth 2 (two) times daily.   [name], NP  Active   metFORMIN (GLUCOPHAGE-XR) 750 MG 24 hr tablet [id number] Yes TAKE 2 TABLETS BY MOUTH ONCE DAILY WITH BREAKFAST  Patient taking differently: Take 750 mg by mouth 2 (two) times daily.   Smitty Cords, DO Taking Active   metoprolol succinate (TOPROL-XL) 25 MG 24 hr tablet [id number]  Take 1  tablet (25 mg total) by mouth every morning. [name], NP  Active   nitroGLYCERIN (NITROSTAT) 0.4 MG SL tablet [id number]  Place 1 tablet (0.4 mg total) under the tongue every 5 (five) minutes as needed for chest pain. Max of 3 total; call 911 Smitty Cords, DO  Active   pantoprazole (PROTONIX) 40 MG tablet [id number]  Take 1 tablet (40 mg total) by mouth 2 (two) times daily for 14 days. Pasty Spillers, MD  Active   Tiotropium Bromide-Olodaterol (STIOLTO RESPIMAT) 2.5-2.5 MCG/ACT AERS [id number] Yes Inhale 2 inhalations into the lungs once daily [provider] Taking Active               Assessment/Plan:   Diabetes: - Currently uncontrolled - Reviewed long term cardiovascular and renal outcomes  of uncontrolled blood sugar - Reviewed goal A1c, goal fasting, and goal 2 hour post prandial glucose - Reviewed dietary modifications including importance of having regular well-balanced meals and snacks throughout the day, while controlling carbohydrate portion sizes - Counsel on signs of and how to manage hypoglycemia - CPP sends renewal for Trulicity 0.75 mg weekly to [facility] for patient - Recommend to check glucose, keep log of results and have this record to review at upcoming medical appointments. Patient to contact provider office sooner if needed for readings outside of established parameters or symptoms - Advise patient that he may decrease his glipizide ER dose to 5 mg daily. Send prescription for this dose to [facility]     COPD:   Encourage patient to use maintenance inhaler consistently        Tobacco Abuse - Provided motivational interviewing to assess tobacco use and strategies for reduction - Provide counseling on benefits of smoking cessation   Hypertension: - Reviewed long term cardiovascular and renal outcomes of uncontrolled blood pressure - Reviewed appropriate blood pressure monitoring technique and reviewed goal  blood pressure. - Recommended to check home blood pressure and heart rate, keep log of results and have this record to review at upcoming medical appointments. Patient to contact provider office sooner if needed for readings outside of established parameters or symptoms     Follow Up Plan: Clinical Pharmacist will follow up with patient by telephone on 01/13/2024 at 10:30 AM     Estelle Grumbles, PharmD, [facility] [phone]

## 2023-11-21 ENCOUNTER — Other Ambulatory Visit: Payer: Self-pay

## 2023-12-16 ENCOUNTER — Other Ambulatory Visit: Payer: Self-pay

## 2023-12-16 ENCOUNTER — Other Ambulatory Visit: Payer: Self-pay | Admitting: Family Medicine

## 2023-12-16 ENCOUNTER — Ambulatory Visit: Payer: BC Managed Care – PPO | Admitting: Family Medicine

## 2023-12-16 ENCOUNTER — Encounter: Payer: Self-pay | Admitting: Family Medicine

## 2023-12-16 VITALS — BP 132/78 | HR 75 | Ht 68.0 in | Wt 181.0 lb

## 2023-12-16 DIAGNOSIS — J432 Centrilobular emphysema: Secondary | ICD-10-CM

## 2023-12-16 DIAGNOSIS — R351 Nocturia: Secondary | ICD-10-CM

## 2023-12-16 DIAGNOSIS — Z125 Encounter for screening for malignant neoplasm of prostate: Secondary | ICD-10-CM

## 2023-12-16 DIAGNOSIS — I10 Essential (primary) hypertension: Secondary | ICD-10-CM

## 2023-12-16 DIAGNOSIS — R252 Cramp and spasm: Secondary | ICD-10-CM

## 2023-12-16 DIAGNOSIS — Z794 Long term (current) use of insulin: Secondary | ICD-10-CM

## 2023-12-16 DIAGNOSIS — I2510 Atherosclerotic heart disease of native coronary artery without angina pectoris: Secondary | ICD-10-CM

## 2023-12-16 DIAGNOSIS — E119 Type 2 diabetes mellitus without complications: Secondary | ICD-10-CM

## 2023-12-16 DIAGNOSIS — E1169 Type 2 diabetes mellitus with other specified complication: Secondary | ICD-10-CM

## 2023-12-16 DIAGNOSIS — E786 Lipoprotein deficiency: Secondary | ICD-10-CM

## 2023-12-16 DIAGNOSIS — Z0000 Encounter for general adult medical examination without abnormal findings: Secondary | ICD-10-CM

## 2023-12-16 DIAGNOSIS — Z7985 Long-term (current) use of injectable non-insulin antidiabetic drugs: Secondary | ICD-10-CM

## 2023-12-16 DIAGNOSIS — K219 Gastro-esophageal reflux disease without esophagitis: Secondary | ICD-10-CM

## 2023-12-16 LAB — POCT GLYCOSYLATED HEMOGLOBIN (HGB A1C): Hemoglobin A1C: 7.1 % — AB (ref 4.0–5.6)

## 2023-12-16 MED ORDER — BACLOFEN 10 MG PO TABS: 5.0000 mg | ORAL_TABLET | Freq: Three times a day (TID) | ORAL | 0 refills | Status: DC | PRN

## 2023-12-16 MED ORDER — PANTOPRAZOLE SODIUM 40 MG PO TBEC: 40.0000 mg | DELAYED_RELEASE_TABLET | Freq: Two times a day (BID) | ORAL | 1 refills | Status: DC

## 2023-12-16 MED ORDER — TRULICITY 1.5 MG/0.5ML ~~LOC~~ SOAJ
1.5000 mg | SUBCUTANEOUS | 2 refills | Status: DC
  Filled 2023-12-16 – 2024-01-13 (×3): qty 2, 28d supply, fill #0

## 2023-12-16 NOTE — Patient Instructions (Addendum)
 Thank you for coming to the office today.  Recent Labs    09/15/23 1425 12/16/23 1329  HGBA1C 9.0* 7.1*    Dose increase Trulicity from 0.75 up to 1.5mg  weekly  When you increase, go ahead and STOP the glipizide  Keep the Metformin  Added baclofen for muscle cramps and spasms, okay to mix and match try this.  Okay to split dose the Gabapentin take one in the AM and one in the PM or adjust the time you take both.  Leg cramps - Try spoonful of yellow mustard to relieve leg cramps or try daily to prevent the problem  - OTC natural option is Hyland's Leg Cramps (Dissolving tablet) take as needed for muscle cramps    DUE for FASTING BLOOD WORK (no food or drink after midnight before the lab appointment, only water or coffee without cream/sugar on the morning of)  SCHEDULE "Lab Only" visit in the morning at the clinic for lab draw in 3 MONTHS   - Make sure Lab Only appointment is at about 1 week before your next appointment, so that results will be available  For Lab Results, once available within 2-3 days of blood draw, you can can log in to MyChart online to view your results and a brief explanation. Also, we can discuss results at next follow-up visit.   Please schedule a Follow-up Appointment to: Return in about 3 months (around 03/17/2024) for 3 month fasting lab > 1 week later Annual Physical.  If you have any other questions or concerns, please feel free to call the office or send a message through MyChart. You may also schedule an earlier appointment if necessary.  Additionally, you may be receiving a survey about your experience at our office within a few days to 1 week by e-mail or mail. We value your feedback.  Saralyn Pilar, DO [facility], [place]

## 2023-12-16 NOTE — Progress Notes (Signed)
 Subjective:    Patient ID: Scott Rosario, male    DOB: 04-24-1961, 63 y.o.   MRN: [id number]  Scott Rosario is a 63 y.o. male presenting on 12/16/2023 for Diabetes   HPI  Discussed the use of AI scribe software for clinical note transcription with the patient, who gave verbal consent to proceed.  History of Present Illness   Scott Rosario is a 63 year old male with diabetes who presents for follow-up on blood sugar management and leg cramps.  Type 2 Diabetes ASCVD/PAD compcation MI Followed by Clinical Pharmacist He has experienced a significant improvement in blood sugar levels, with his A1c decreasing from 9.0% to 7.1% over the past three months. This improvement is attributed to regular monitoring, pricking his finger three times a day, and adherence to his medication regimen, which includes Trulicity and a reduced dose of glipizide from 10 mg to 5 mg. He notes occasional low blood sugar episodes, with the lowest being 73 mg/dL, causing symptoms of feeling drained and drowsy, but without the nervousness and shakiness he previously experienced. Meds Trulicity 0.75mg  weekly inj Metformin XR 750mg  x 2 = 1500mg  daily Reports  good compliance. Tolerating well w/o side-effects Currently on ARB Admits rare hypoglycemia lowest 64, some symptoms 70-80 range. Denies polyuria, visual changes, numbness or tingling.  Leg Cramps He experiences significant leg cramps, primarily in his left leg, occurring at night and affecting his ankle, foot, and calf. These cramps have been severe enough to cause soreness and have been exacerbated by recent physical activity and falls. He has been using gabapentin 300 mg, but taking two pills at night makes him too drowsy in the morning. He has not used baclofen recently. He mentions falling twice in the last 90 days, with one fall resulting in a significant bruise, though it has improved.  GERD He manages gastroesophageal reflux disease with  pantoprazole, which he takes twice daily. Certain foods like pizza and spaghetti exacerbate his symptoms, occasionally requiring an additional dose.  On Pantoprazole 40 TWICE A DAY    Centrilobular Emphysema Followed by Pulmonology Dr Meredeth Ide Last LDCT 04/2023, resolved LLL nodule.      Health Maintenance: Updated     09/30/2023   10:09 AM 09/15/2023    3:08 PM 07/04/2019    8:12 AM  Depression screen PHQ 2/9  Decreased Interest 0 0 0  Down, Depressed, Hopeless 0 0 0  PHQ - 2 Score 0 0 0  Altered sleeping 3 3 0  Tired, decreased energy 3 2 0  Change in appetite 0 0 0  Feeling bad or failure about yourself  0 0 0  Trouble concentrating 0 0 0  Moving slowly or fidgety/restless 1 0 0  Suicidal thoughts 0 0 0  PHQ-9 Score 7 5 0  Difficult doing work/chores   Not difficult at all       09/30/2023   10:09 AM 09/15/2023    3:09 PM  GAD 7 : Generalized Anxiety Score  Nervous, Anxious, on Edge 0 2  Control/stop worrying 0 2  Worry too much - different things 0 2  Trouble relaxing 0 2  Restless 0 2  Easily annoyed or irritable 0 2  Afraid - awful might happen 0 0  Total GAD 7 Score 0 12    Social History   Tobacco Use   Smoking status: Every Day    Current packs/day: 1.00    Average packs/day: 1 pack/day for 45.0 years (45.0 ttl pk-yrs)  Types: Cigarettes   Smokeless tobacco: Never   Tobacco comments:    since age 28  Vaping Use   Vaping status: Never Used  Substance Use Topics   Alcohol use: No    Alcohol/week: 0.0 standard drinks of alcohol   Drug use: No    Review of Systems Per HPI unless specifically indicated above     Objective:    BP 132/78 (BP Location: Left Arm, Patient Position: Sitting)   Pulse 75   Ht 5\' 8"  (1.727 m)   Wt 181 lb (82.1 kg)   SpO2 98%   BMI 27.52 kg/m   Wt Readings from Last 3 Encounters:  12/16/23 181 lb (82.1 kg)  09/30/23 182 lb (82.6 kg)  09/15/23 182 lb (82.6 kg)    Physical Exam Vitals and nursing note reviewed.   Constitutional:      General: He is not in acute distress.    Appearance: He is well-developed. He is not diaphoretic.     Comments: Well-appearing, comfortable, cooperative  HENT:     Head: Normocephalic and atraumatic.  Eyes:     General:        Right eye: No discharge.        Left eye: No discharge.     Conjunctiva/sclera: Conjunctivae normal.  Neck:     Thyroid: No thyromegaly.  Cardiovascular:     Rate and Rhythm: Normal rate and regular rhythm.     Pulses: Normal pulses.     Heart sounds: Normal heart sounds. No murmur heard. Pulmonary:     Effort: Pulmonary effort is normal. No respiratory distress.     Breath sounds: Normal breath sounds. No wheezing or rales.  Musculoskeletal:        General: Normal range of motion.     Cervical back: Normal range of motion and neck supple.  Lymphadenopathy:     Cervical: No cervical adenopathy.  Skin:    General: Skin is warm and dry.     Findings: No erythema or rash.  Neurological:     Mental Status: He is alert and oriented to person, place, and time. Mental status is at baseline.  Psychiatric:        Behavior: Behavior normal.     Comments: Well groomed, good eye contact, normal speech and thoughts     Recent Labs    09/15/23 1425 12/16/23 1329  HGBA1C 9.0* 7.1*     Results for orders placed or performed in visit on 12/16/23  POCT HgB A1C   Collection Time: 12/16/23  1:29 PM  Result Value Ref Range   Hemoglobin A1C 7.1 (A) 4.0 - 5.6 %   HbA1c POC (<> result, manual entry)     HbA1c, POC (prediabetic range)     HbA1c, POC (controlled diabetic range)        Assessment & Plan:   Problem List Items Addressed This Visit     COPD (chronic obstructive pulmonary disease) (HCC) (Chronic)   GERD (gastroesophageal reflux disease)   Relevant Medications   pantoprazole (PROTONIX) 40 MG tablet   Type 2 diabetes mellitus with other specified complication (HCC) - Primary   Relevant Medications   TRULICITY 1.5 MG/0.5ML  SOAJ   Other Relevant Orders   POCT HgB A1C (Completed)   Other Visit Diagnoses       Diabetes mellitus treated with injections of non-insulin medication (HCC)       Relevant Medications   TRULICITY 1.5 MG/0.5ML SOAJ     Essential hypertension  Muscle cramps       Relevant Medications   baclofen (LIORESAL) 10 MG tablet        Type 2 Diabetes Mellitus Complications with ASCVD / CAD Significantly improved control A1c 9.0 down to 7.1% Followed by Clinical Pharmacist with success Tolerating treatment on GLP1  Plan to discontinue glipizide due to hypoglycemia. Increase Trulicity for better glycemic control and potential weight loss. - Increase Trulicity dose from 0.75 mg to 1.5 mg weekly. New rx sent. - Rare hypoglycemia - will now discontinue glipizide upon increasing Trulicity. - Continue metformin XR 750mg  TWICE A DAY  - Recheck HbA1c in 3 months.  Muscle Cramps Severe cramps in left leg, worsened by activity, disrupt sleep.  Gabapentin helpful but causes drowsiness. Baclofen prescribed for cramps. - Prescribe baclofen 10 mg for muscle cramps PRN - Advise on splitting gabapentin dose 300mg  TWICE A DAY instead of 600mg  at bedtime or adjusting timing to reduce drowsiness in AM - Consider over-the-counter magnesium or potassium supplements for cramp prevention.  Gastroesophageal Reflux Disease (GERD) Pantoprazole used for GERD, exacerbated by certain foods. - Renew pantoprazole prescription for twice daily use.  General Health Maintenance Due for routine screenings. Previous polyps may affect non-invasive screening options. - Schedule blood and urine tests in 3 months. - Discuss colonoscopy options at next visit.      Forward chart to Estelle Grumbles [facility] CPP for continued clinical pharmacy review.   Orders Placed This Encounter  Procedures   POCT HgB A1C    Meds ordered this encounter  Medications   baclofen (LIORESAL) 10 MG tablet    Sig: Take 0.5-1 tablets  (5-10 mg total) by mouth 3 (three) times daily as needed for muscle spasms.    Dispense:  30 each    Refill:  0   pantoprazole (PROTONIX) 40 MG tablet    Sig: Take 1 tablet (40 mg total) by mouth 2 (two) times daily.    Dispense:  180 tablet    Refill:  1   TRULICITY 1.5 MG/0.5ML SOAJ    Sig: Inject 1.5 mg into the skin once a week.    Dispense:  2 mL    Refill:  2    Follow up plan: Return in about 3 months (around 03/17/2024) for 3 month fasting lab > 1 week later Annual Physical.  Future labs ordered for 03/22/24 Blood + Urine microalbumin  Saralyn Pilar, DO [facility] 12/16/2023, 1:37 PM

## 2023-12-23 ENCOUNTER — Ambulatory Visit
Admission: EM | Admit: 2023-12-23 | Discharge: 2023-12-23 | Disposition: A | Discharge status: Home / Self Care | Attending: Emergency Medicine | Admitting: Emergency Medicine

## 2023-12-23 ENCOUNTER — Ambulatory Visit: Payer: Self-pay

## 2023-12-23 ENCOUNTER — Encounter: Payer: Self-pay | Admitting: Emergency Medicine

## 2023-12-23 DIAGNOSIS — R300 Dysuria: Secondary | ICD-10-CM

## 2023-12-23 DIAGNOSIS — I10 Essential (primary) hypertension: Secondary | ICD-10-CM | POA: Insufficient documentation

## 2023-12-23 DIAGNOSIS — N390 Urinary tract infection, site not specified: Secondary | ICD-10-CM | POA: Diagnosis not present

## 2023-12-23 LAB — URINALYSIS, W/ REFLEX TO CULTURE (INFECTION SUSPECTED): WBC, UA: >50 WBC/hpf (ref 0–5)

## 2023-12-23 MED ORDER — CEFUROXIME AXETIL 500 MG PO TABS: 500.0000 mg | ORAL_TABLET | Freq: Two times a day (BID) | ORAL | 0 refills | Status: AC

## 2023-12-23 MED ORDER — CEFTRIAXONE SODIUM 1 G IJ SOLR
1.0000 g | Freq: Once | INTRAMUSCULAR | Status: AC
  Administered 2023-12-23: 1 g via INTRAMUSCULAR

## 2023-12-23 NOTE — Telephone Encounter (Signed)
 Chief Complaint: dysuria Symptoms: dysuria, frequency Frequency: since yesterday Pertinent Negatives: Patient denies fever, hematuria Disposition: [] ED /[x] Urgent Care (no appt availability in office) / [] Appointment(In office/virtual)/ []  [place] [facility]/ [] Home Care/ [] Refused Recommended Disposition /[] [place] [facility]/ []  Follow-up with PCP Additional Notes: Pt reports dysuria and frequency since yesterday. Pt reports burning with urination and then states there is a very sharp pain at the end of urinating. Pt endorsed an episode of nausea yesterday and had the chills briefly. None now. Pt states his urine looks dark yellow and smells different to him. RN advised pt he should be seen within 24 hours. Being that it is the end of the week RN advised pt he should go to an UC tonight or tomorrow AM. RN provided pt with two UC in the area near him. RN advised pt it is important he go to UC as soon as he can in the case that he has a possible infection and could worsen. Pt verbalized understanding   Copied from CRM [id number]. Topic: Clinical - Red Word Triage >> Dec 23, 2023  3:54 PM Fredrich Romans wrote: Red Word that prompted transfer to Nurse Triage: Pain at the end of urination Reason for Disposition  Urinating more frequently than usual (i.e., frequency)  Answer Assessment - Initial Assessment Questions 1. SYMPTOM: "What's the main symptom you're concerned about?" (e.g., frequency, incontinence)     Pain towards the end of urinating, frequency, if I try to "hold it it gets even worse when I let it go", "burning sensation and at the end it is a strong sharp pain" 2. ONSET: "When did the pain and frequency start?"     Yesterday 3. PAIN: "Is there any pain?" If Yes, ask: "How bad is it?" (Scale: 1-10; mild, moderate, severe)     Burning with urination and a sharp pain at the end of urinating  4. CAUSE: "What do you think is causing the symptoms?"     Poss UTI 5. OTHER SYMPTOMS: "Do you  have any other symptoms?" (e.g., blood in urine, fever, flank pain, pain with urination)     "Last night it got better and then I got up in the middle of the night and it didn't do it", "later on this morning it started back up", "the pain has gotten worse and worse." "Right there at the hip line I noticed pain but that has eased off." No hematuria. Hx of kidney stones. States urine is dark yellow/orange color. Not sure if it is cloudy. States it smells "somewhat" different. "I had a little chill last night but not today", no fever. "I got very nauseated yesterday, almost sick, but I didn't throw up. I was very nauseated and it lasted a couple hours and then went away."  Protocols used: Urinary Symptoms-A-AH

## 2023-12-23 NOTE — Discharge Instructions (Addendum)
 You received a shot of Rocephin in office today Take ceftin as prescribed for UTI Drink plenty of water Avoid caffeine If you develop nausea,vomiting, fever, unable to keep meds down or worsening issues go immediately to ER for further evaluation

## 2023-12-23 NOTE — ED Triage Notes (Signed)
Patient c/o dysuria and urinary urgency that started last night.  

## 2023-12-23 NOTE — ED Provider Notes (Signed)
 [facility]    CSN: [id number] Arrival date & time: 12/23/23  1908      History   Chief Complaint Chief Complaint  Patient presents with   Dysuria    HPI Scott Rosario is a 63 y.o. male.   63 year old male pt, Scott Rosario, presents to urgent care for evaluation of dysuria and urinary urgency that started last night.  Patient has been taking Azo last dose was 430 this afternoon.  States this does not feel like when he last had kidney stone"years ago"  Past medical history: Diabetes, COPD smoker hypertension CAD history of kidney stones  The history is provided by the patient. No language interpreter was used.    Past Medical History:  Diagnosis Date   Acute calculous cholecystitis    Alkaline phosphatase elevation    Aortic aneurysm (HCC) 12/10/2018   3.4 cm March 2020   Aortic atherosclerosis (HCC)    on CT 02/25/14   Asthma    CAD (coronary artery disease)    Cirrhosis (HCC)    Cirrhosis (HCC)    COPD (chronic obstructive pulmonary disease) (HCC)    Diabetes mellitus without complication (HCC)    Emphysema of lung (HCC)    Gallstone 12/10/2018   Gastritis without bleeding    GERD (gastroesophageal reflux disease)    Heart attack (HCC)    2005, 2017 (stent after each)   History of hiatal hernia    small   History of kidney stones    h/o   Hyperlipidemia    Hypertension    Ingrown toenail of both feet 11/29/2018   Pneumonia 2018   Pulmonary nodules    x 3, (5mm, 5mm, 4mm) chest CT February 25, 2014   Renal cyst    on CT 02/25/14   Tobacco use     Patient Active Problem List   Diagnosis Date Noted   Acute UTI 12/23/2023   Dysuria 12/23/2023   Dyspepsia    Nausea without vomiting    Liver disease 11/21/2019   Polyp of sigmoid colon    Aortic aneurysm (HCC) 12/10/2018   Overweight (BMI 25.0-29.9) 12/20/2017   Acquired trigger finger 02/15/2017   COPD (chronic obstructive pulmonary disease) (HCC)    Type 2 diabetes mellitus with other specified  complication (HCC)    GERD (gastroesophageal reflux disease)    Hyperlipidemia    Elevated blood pressure reading in office with diagnosis of hypertension    Tobacco use    CAD (coronary artery disease)    Aortic atherosclerosis (HCC)    Multiple pulmonary nodules determined by computed tomography of lung 12/31/2014    Past Surgical History:  Procedure Laterality Date   APPENDECTOMY     CARDIAC CATHETERIZATION Left 03/09/2016   Procedure: Left Heart Cath and Coronary Angiography;  Surgeon: Lamar Blinks, MD;  Location: [facility];  Service: Cardiovascular;  Laterality: Left;   CARDIAC CATHETERIZATION N/A 03/09/2016   Procedure: Coronary Stent Intervention;  Surgeon: Marcina Millard, MD;  Location: [facility];  Service: Cardiovascular;  Laterality: N/A;   CHOLECYSTECTOMY N/A 01/31/2019   Procedure: LAPAROSCOPIC CHOLECYSTECTOMY - DIABETIC;  Surgeon: Ancil Linsey, MD;  Location: [facility];  Service: General;  Laterality: N/A;   COLONOSCOPY WITH PROPOFOL N/A 03/22/2019   Procedure: COLONOSCOPY WITH PROPOFOL;  Surgeon: Midge Minium, MD;  Location: [facility];  Service: Endoscopy;  Laterality: N/A;  Diabetic - insulin and oral meds   COLONOSCOPY WITH PROPOFOL N/A 11/04/2020  Procedure: COLONOSCOPY WITH PROPOFOL;  Surgeon: Pasty Spillers, MD;  Location: [facility];  Service: Endoscopy;  Laterality: N/A;  COVID + 10-15-20   CORONARY ANGIOPLASTY WITH STENT PLACEMENT  10/25/2003   x 2   ESOPHAGOGASTRODUODENOSCOPY (EGD) WITH PROPOFOL N/A 10/24/2017   Procedure: ESOPHAGOGASTRODUODENOSCOPY (EGD) WITH PROPOFOL;  Surgeon: Midge Minium, MD;  Location: [facility];  Service: Endoscopy;  Laterality: N/A;  Diabetic - oral meds   ESOPHAGOGASTRODUODENOSCOPY (EGD) WITH PROPOFOL N/A 05/01/2020   Procedure: ESOPHAGOGASTRODUODENOSCOPY (EGD) WITH PROPOFOL;  Surgeon: Pasty Spillers, MD;  Location: [facility];  Service: Endoscopy;  Laterality: N/A;    ESOPHAGOGASTRODUODENOSCOPY (EGD) WITH PROPOFOL N/A 11/04/2020   Procedure: ESOPHAGOGASTRODUODENOSCOPY (EGD) WITH PROPOFOL with biopsy;  Surgeon: Pasty Spillers, MD;  Location: [facility];  Service: Endoscopy;  Laterality: N/A;   POLYPECTOMY  03/22/2019   Procedure: POLYPECTOMY;  Surgeon: Midge Minium, MD;  Location: [facility];  Service: Endoscopy;;   POLYPECTOMY  11/04/2020   Procedure: POLYPECTOMY;  Surgeon: Pasty Spillers, MD;  Location: [facility];  Service: Endoscopy;;       Home Medications    Prior to Admission medications   Medication Sig Start Date End Date Taking? Authorizing Provider  cefUROXime (CEFTIN) 500 MG tablet Take 1 tablet (500 mg total) by mouth 2 (two) times daily with a meal for 7 days. 12/23/23 12/30/23 Yes Merrit Waugh, Para March, NP  albuterol (PROVENTIL HFA;VENTOLIN HFA) 108 (90 Base) MCG/ACT inhaler Inhale 2 puffs into the lungs every 4 (four) hours as needed for wheezing or shortness of breath. 12/21/18   Poulose, Percell Belt, NP  amLODipine (NORVASC) 10 MG tablet Take 1 tablet (10 mg total) by mouth daily. 09/30/23   Karamalegos, [name], DO  ascorbic acid (VITAMIN C) 1000 MG tablet Take 1 tablet by mouth 1 day or 1 dose.    [provider]  aspirin EC 81 MG tablet Take 81 mg by mouth daily.    [provider]  atorvastatin (LIPITOR) 80 MG tablet Take 1 tablet (80 mg total) by mouth at bedtime. 01/23/20   [name], PA-C  baclofen (LIORESAL) 10 MG tablet Take 0.5-1 tablets (5-10 mg total) by mouth 3 (three) times daily as needed for muscle spasms. 12/16/23   Karamalegos, [name], DO  folic acid-pyridoxine-cyancobalamin (FOLTX) 2.5-25-2 MG TABS tablet Take 1 tablet by mouth daily.    [provider]  gabapentin (NEURONTIN) 300 MG capsule TAKE 1 CAPSULE BY MOUTH AT BEDTIME 11/09/23   Karamalegos, Alexander J, DO  GVOKE HYPOPEN 2-PACK 1 MG/0.2ML SOAJ Inject 1 mg into the skin as needed (hypoglycemia). 06/06/23    Karamalegos, [name], DO  Insulin Pen Needle 32G X 4 MM MISC Use as directed with insulin daily 06/06/19   [name], PA-C  losartan (COZAAR) 50 MG tablet Take 1 tablet (50 mg total) by mouth every evening. Patient taking differently: Take 50 mg by mouth 2 (two) times daily. 03/05/19   Poulose, Percell Belt, NP  metFORMIN (GLUCOPHAGE-XR) 750 MG 24 hr tablet TAKE 2 TABLETS BY MOUTH ONCE DAILY WITH BREAKFAST Patient taking differently: Take 750 mg by mouth 2 (two) times daily. 08/23/23   Karamalegos, [name], DO  metoprolol succinate (TOPROL-XL) 25 MG 24 hr tablet Take 1 tablet (25 mg total) by mouth every morning. 03/05/19   Poulose, Percell Belt, NP  nitroGLYCERIN (NITROSTAT) 0.4 MG SL tablet Place 1 tablet (0.4 mg total) under the tongue every 5 (five) minutes as needed for chest pain. Max  of 3 total; call 911 06/06/23   [name], [name], DO  pantoprazole (PROTONIX) 40 MG tablet Take 1 tablet (40 mg total) by mouth 2 (two) times daily. 12/16/23   Karamalegos, [name], DO  Tiotropium Bromide-Olodaterol (STIOLTO RESPIMAT) 2.5-2.5 MCG/ACT AERS Inhale 2 inhalations into the lungs once daily 11/08/23   [provider]  TRULICITY 1.5 MG/0.5ML SOAJ Inject 1.5 mg into the skin once a week. 12/16/23   Karamalegos, [name], DO  glipiZIDE (GLUCOTROL XL) 5 MG 24 hr tablet Take 1 tablet (5 mg total) by mouth daily with breakfast. Hold if morning sugar <100 or if not eating during the day 07/04/19   [name], PA-C    Family History Family History  Problem Relation Age of Onset   Diabetes Mother    Hypertension Mother    Arthritis Mother    Hyperlipidemia Mother    Heart attack Father 7   Epilepsy Father    Diabetes Father    Seizures Father    Heart disease Father    Hyperlipidemia Father    Hypertension Father    Stroke Father    Healthy Sister    Diabetes Brother    Hyperlipidemia Brother    Hypertension Brother    Heart attack Brother 22   Coronary artery disease  Brother    Cancer Maternal Grandmother        unknown   Diabetes Paternal Grandmother    Heart disease Paternal Grandmother    Hyperlipidemia Paternal Grandmother    Hypertension Paternal Grandmother    Heart attack Paternal Grandfather     Social History Social History   Tobacco Use   Smoking status: Every Day    Current packs/day: 1.00    Average packs/day: 1 pack/day for 45.0 years (45.0 ttl pk-yrs)    Types: Cigarettes   Smokeless tobacco: Never   Tobacco comments:    since age 42  Vaping Use   Vaping status: Never Used  Substance Use Topics   Alcohol use: No    Alcohol/week: 0.0 standard drinks of alcohol   Drug use: No     Allergies   Canagliflozin, Semaglutide, and Tape   Review of Systems Review of Systems  Constitutional:  Negative for fever.  Gastrointestinal:  Negative for abdominal pain, nausea and vomiting.  Genitourinary:  Positive for dysuria, frequency and urgency.  Skin: Negative.   All other systems reviewed and are negative.    Physical Exam Triage Vital Signs ED Triage Vitals  Encounter Vitals Group     BP 12/23/23 1913 (!) 162/78     Systolic BP Percentile --      Diastolic BP Percentile --      Pulse Rate 12/23/23 1913 95     Resp 12/23/23 1913 15     Temp 12/23/23 1913 98.6 F (37 C)     Temp Source 12/23/23 1913 Oral     SpO2 12/23/23 1913 95 %     Weight 12/23/23 1912 181 lb (82.1 kg)     Height 12/23/23 1912 5\' 8"  (1.727 m)     Head Circumference --      Peak Flow --      Pain Score 12/23/23 1912 8     Pain Loc --      Pain Education --      Exclude from Growth Chart --    No data found.  Updated Vital Signs BP (!) 162/78 (BP Location: Left Arm)   Pulse 95   Temp  98.6 F (37 C) (Oral)   Resp 15   Ht 5\' 8"  (1.727 m)   Wt 181 lb (82.1 kg)   SpO2 95%   BMI 27.52 kg/m   Visual Acuity Right Eye Distance:   Left Eye Distance:   Bilateral Distance:    Right Eye Near:   Left Eye Near:    Bilateral Near:      Physical Exam Vitals and nursing note reviewed.  Constitutional:      General: He is not in acute distress.    Appearance: He is well-developed and well-groomed.  HENT:     Head: Normocephalic and atraumatic.  Eyes:     Conjunctiva/sclera: Conjunctivae normal.  Cardiovascular:     Rate and Rhythm: Normal rate.     Heart sounds: No murmur heard. Pulmonary:     Effort: Pulmonary effort is normal. No respiratory distress.  Abdominal:     Palpations: Abdomen is soft.     Tenderness: There is no abdominal tenderness.  Musculoskeletal:        General: No swelling.     Cervical back: Neck supple.  Skin:    General: Skin is warm and dry.     Capillary Refill: Capillary refill takes less than 2 seconds.  Neurological:     General: No focal deficit present.     Mental Status: He is alert and oriented to person, place, and time.     GCS: GCS eye subscore is 4. GCS verbal subscore is 5. GCS motor subscore is 6.     Cranial Nerves: No cranial nerve deficit.     Sensory: No sensory deficit.  Psychiatric:        Mood and Affect: Mood normal.        Behavior: Behavior is cooperative.      UC Treatments / Results  Labs (all labs ordered are listed, but only abnormal results are displayed) Labs Reviewed  URINALYSIS, W/ REFLEX TO CULTURE (INFECTION SUSPECTED) - Abnormal; Notable for the following components:      Result Value   Color, Urine ORANGE (*)    APPearance HAZY (*)    Glucose, UA   (*)    Value: TEST NOT REPORTED DUE TO COLOR INTERFERENCE OF URINE PIGMENT   Hgb urine dipstick   (*)    Value: TEST NOT REPORTED DUE TO COLOR INTERFERENCE OF URINE PIGMENT   Bilirubin Urine   (*)    Value: TEST NOT REPORTED DUE TO COLOR INTERFERENCE OF URINE PIGMENT   Ketones, ur   (*)    Value: TEST NOT REPORTED DUE TO COLOR INTERFERENCE OF URINE PIGMENT   Protein, ur   (*)    Value: TEST NOT REPORTED DUE TO COLOR INTERFERENCE OF URINE PIGMENT   Nitrite   (*)    Value: TEST NOT REPORTED  DUE TO COLOR INTERFERENCE OF URINE PIGMENT   Leukocytes,Ua   (*)    Value: TEST NOT REPORTED DUE TO COLOR INTERFERENCE OF URINE PIGMENT   Bacteria, UA MANY (*)    All other components within normal limits  URINE CULTURE    EKG   Radiology No results found.  Procedures Procedures (including critical care time)  Medications Ordered in UC Medications  cefTRIAXone (ROCEPHIN) injection 1 g (1 g Intramuscular Given 12/23/23 1946)    Initial Impression / Assessment and Plan / UC Course  I have reviewed the triage vital signs and the nursing notes.  Pertinent labs & imaging results that were available during my care  of the patient were reviewed by me and considered in my medical decision making (see chart for details).    Patient received a shot of Rocephin in the office, will treat with Ceftin for 7 days, urine culture was sent, strict go to ER precautions given, push fluids, patient verbalized understanding to this provider  Ddx: UTI, kidney stone, renal colic, prostatitis Final Clinical Impressions(s) / UC Diagnoses   Final diagnoses:  Acute UTI  Elevated blood pressure reading in office with diagnosis of hypertension  Dysuria     Discharge Instructions      You received a shot of Rocephin in office today Take ceftin as prescribed for UTI Drink plenty of water Avoid caffeine If you develop nausea,vomiting, fever, unable to keep meds down or worsening issues go immediately to ER for further evaluation      ED Prescriptions     Medication Sig Dispense Auth. Provider   cefUROXime (CEFTIN) 500 MG tablet Take 1 tablet (500 mg total) by mouth 2 (two) times daily with a meal for 7 days. 14 tablet Kaire Stary, Para March, NP      PDMP not reviewed this encounter.   Clancy Gourd, NP 12/23/23 2007

## 2023-12-25 LAB — URINE CULTURE: Culture: >=100000 — AB

## 2023-12-26 ENCOUNTER — Other Ambulatory Visit: Payer: Self-pay | Admitting: Family Medicine

## 2023-12-26 DIAGNOSIS — E1169 Type 2 diabetes mellitus with other specified complication: Secondary | ICD-10-CM

## 2023-12-26 DIAGNOSIS — I10 Essential (primary) hypertension: Secondary | ICD-10-CM

## 2023-12-28 NOTE — Telephone Encounter (Signed)
 Requested Prescriptions  Pending Prescriptions Disp Refills   amLODipine (NORVASC) 10 MG tablet [Pharmacy Med Name: AMLODIPINE BESYLATE 10 MG TAB] 90 tablet 1    Sig: TAKE 1 TABLET BY MOUTH ONCE DAILY     Cardiovascular: Calcium Channel Blockers 2 Failed - 12/28/2023 11:37 AM      Failed - Last BP in normal range    BP Readings from Last 1 Encounters:  12/23/23 (!) 162/78         Passed - Last Heart Rate in normal range    Pulse Readings from Last 1 Encounters:  12/23/23 95         Passed - Valid encounter within last 6 months    Recent Outpatient Visits           1 week ago Type 2 diabetes mellitus with other specified complication, with long-term current use of insulin (HCC)   [facility] [place], [name], DO               metFORMIN (GLUCOPHAGE-XR) 750 MG 24 hr tablet [Pharmacy Med Name: METFORMIN HCL ER 750 MG TAB] 180 tablet 1    Sig: TAKE 2 TABLETS BY MOUTH ONCE DAILY WITH BREAKFAST     Endocrinology:  Diabetes - Biguanides Failed - 12/28/2023 11:37 AM      Failed - Cr in normal range and within 360 days    Creat  Date Value Ref Range Status  06/06/2019 0.75 0.70 - 1.33 mg/dL Final    Comment:    For patients >39 years of age, the reference limit for Creatinine is approximately 13% higher for people identified as African-American. .    Creatinine, Ser  Date Value Ref Range Status  11/24/2022 0.57 (L) 0.61 - 1.24 mg/dL Final   Creatinine, Urine  Date Value Ref Range Status  11/29/2018 237 20 - 320 mg/dL Final         Failed - eGFR in normal range and within 360 days    GFR, Est African American  Date Value Ref Range Status  06/06/2019 116 > OR = 60 mL/min/1.20m2 Final   GFR, Est Non African American  Date Value Ref Range Status  06/06/2019 100 > OR = 60 mL/min/1.72m2 Final   GFR, Estimated  Date Value Ref Range Status  11/24/2022 >60 >60 mL/min Final    Comment:    (NOTE) Calculated using the CKD-EPI Creatinine  Equation (2021)          Failed - B12 Level in normal range and within 720 days    No results found for: "VITAMINB12"       Failed - CBC within normal limits and completed in the last 12 months    WBC  Date Value Ref Range Status  11/24/2022 13.0 (H) 4.0 - 10.5 K/uL Final   RBC  Date Value Ref Range Status  11/24/2022 4.87 4.22 - 5.81 MIL/uL Final   Hemoglobin  Date Value Ref Range Status  11/24/2022 14.0 13.0 - 17.0 g/dL Final  16/06/9603 54.0 12.6 - 17.7 g/dL Final   HCT  Date Value Ref Range Status  11/24/2022 42.0 39.0 - 52.0 % Final   Hematocrit  Date Value Ref Range Status  02/17/2016 47.2 37.5 - 51.0 % Final   MCHC  Date Value Ref Range Status  11/24/2022 33.3 30.0 - 36.0 g/dL Final   [facility]  Date Value Ref Range Status  11/24/2022 28.7 26.0 - 34.0 pg Final   MCV  Date Value  Ref Range Status  11/24/2022 86.2 80.0 - 100.0 fL Final  02/17/2016 88 79 - 97 fL Final   No results found for: "PLTCOUNTKUC", "LABPLAT", "POCPLA" RDW  Date Value Ref Range Status  11/24/2022 13.3 11.5 - 15.5 % Final  02/17/2016 13.9 12.3 - 15.4 % Final         Passed - HBA1C is between 0 and 7.9 and within 180 days    Hemoglobin A1C  Date Value Ref Range Status  12/16/2023 7.1 (A) 4.0 - 5.6 % Final   Hgb A1c MFr Bld  Date Value Ref Range Status  06/06/2019 6.5 (H) <5.7 % of total Hgb Final    Comment:    For someone without known diabetes, a hemoglobin A1c value of 6.5% or greater indicates that they may have  diabetes and this should be confirmed with a follow-up  test. . For someone with known diabetes, a value <7% indicates  that their diabetes is well controlled and a value  greater than or equal to 7% indicates suboptimal  control. A1c targets should be individualized based on  duration of diabetes, age, comorbid conditions, and  other considerations. . Currently, no consensus exists regarding use of hemoglobin A1c for diagnosis of diabetes for children. Verna Czech - Valid encounter within last 6 months    Recent Outpatient Visits           1 week ago Type 2 diabetes mellitus with other specified complication, with long-term current use of insulin [facility])    [facility], [name], DO               glipiZIDE (GLUCOTROL XL) 5 MG 24 hr tablet [Pharmacy Med Name: GLIPIZIDE ER 5 MG TAB] 30 tablet 0    Sig: TAKE 1 TABLET BY MOUTH ONCE DAILY WITH BREAKFAST *DOSE DECREASE     Endocrinology:  Diabetes - Sulfonylureas Failed - 12/28/2023 11:37 AM      Failed - Cr in normal range and within 360 days    Creat  Date Value Ref Range Status  06/06/2019 0.75 0.70 - 1.33 mg/dL Final    Comment:    For patients >84 years of age, the reference limit for Creatinine is approximately 13% higher for people identified as African-American. .    Creatinine, Ser  Date Value Ref Range Status  11/24/2022 0.57 (L) 0.61 - 1.24 mg/dL Final   Creatinine, Urine  Date Value Ref Range Status  11/29/2018 237 20 - 320 mg/dL Final         Passed - HBA1C is between 0 and 7.9 and within 180 days    Hemoglobin A1C  Date Value Ref Range Status  12/16/2023 7.1 (A) 4.0 - 5.6 % Final   Hgb A1c MFr Bld  Date Value Ref Range Status  06/06/2019 6.5 (H) <5.7 % of total Hgb Final    Comment:    For someone without known diabetes, a hemoglobin A1c value of 6.5% or greater indicates that they may have  diabetes and this should be confirmed with a follow-up  test. . For someone with known diabetes, a value <7% indicates  that their diabetes is well controlled and a value  greater than or equal to 7% indicates suboptimal  control. A1c targets should be individualized based on  duration of diabetes, age, comorbid conditions, and  other considerations. . Currently, no consensus exists regarding use of hemoglobin A1c for  diagnosis of diabetes for children. Verna Czech - Valid encounter within last 6 months     Recent Outpatient Visits           1 week ago Type 2 diabetes mellitus with other specified complication, with long-term current use of insulin [facility])   [place] [facility] [place], [name], [place]

## 2024-01-02 ENCOUNTER — Other Ambulatory Visit: Payer: Self-pay | Admitting: Family Medicine

## 2024-01-02 DIAGNOSIS — E1169 Type 2 diabetes mellitus with other specified complication: Secondary | ICD-10-CM

## 2024-01-03 NOTE — Telephone Encounter (Signed)
 Requested Prescriptions  Refused Prescriptions Disp Refills   glipiZIDE (GLUCOTROL XL) 5 MG 24 hr tablet [Pharmacy Med Name: GLIPIZIDE ER 5 MG TAB] 30 tablet 0    Sig: TAKE 1 TABLET BY MOUTH ONCE DAILY WITH BREAKFAST *DOSE DECREASE     Endocrinology:  Diabetes - Sulfonylureas Failed - 01/03/2024 11:34 AM      Failed - Cr in normal range and within 360 days    Creat  Date Value Ref Range Status  06/06/2019 0.75 0.70 - 1.33 mg/dL Final    Comment:    For patients >20 years of age, the reference limit for Creatinine is approximately 13% higher for people identified as African-American. .    Creatinine, Ser  Date Value Ref Range Status  11/24/2022 0.57 (L) 0.61 - 1.24 mg/dL Final   Creatinine, Urine  Date Value Ref Range Status  11/29/2018 237 20 - 320 mg/dL Final         Passed - HBA1C is between 0 and 7.9 and within 180 days    Hemoglobin A1C  Date Value Ref Range Status  12/16/2023 7.1 (A) 4.0 - 5.6 % Final   Hgb A1c MFr Bld  Date Value Ref Range Status  06/06/2019 6.5 (H) <5.7 % of total Hgb Final    Comment:    For someone without known diabetes, a hemoglobin A1c value of 6.5% or greater indicates that they may have  diabetes and this should be confirmed with a follow-up  test. . For someone with known diabetes, a value <7% indicates  that their diabetes is well controlled and a value  greater than or equal to 7% indicates suboptimal  control. A1c targets should be individualized based on  duration of diabetes, age, comorbid conditions, and  other considerations. . Currently, no consensus exists regarding use of hemoglobin A1c for diagnosis of diabetes for children. Verna Czech - Valid encounter within last 6 months    Recent Outpatient Visits           2 weeks ago Type 2 diabetes mellitus with other specified complication, with long-term current use of insulin [facility])   [place] [facility] [place], [name], [place]

## 2024-01-05 ENCOUNTER — Other Ambulatory Visit: Payer: Self-pay

## 2024-01-13 ENCOUNTER — Telehealth: Payer: Self-pay

## 2024-01-13 ENCOUNTER — Other Ambulatory Visit (HOSPITAL_COMMUNITY): Payer: Self-pay

## 2024-01-13 ENCOUNTER — Other Ambulatory Visit (INDEPENDENT_AMBULATORY_CARE_PROVIDER_SITE_OTHER): Payer: BC Managed Care – PPO | Admitting: Pharmacist

## 2024-01-13 ENCOUNTER — Other Ambulatory Visit: Payer: Self-pay

## 2024-01-13 DIAGNOSIS — E119 Type 2 diabetes mellitus without complications: Secondary | ICD-10-CM

## 2024-01-13 DIAGNOSIS — E1169 Type 2 diabetes mellitus with other specified complication: Secondary | ICD-10-CM

## 2024-01-13 DIAGNOSIS — Z794 Long term (current) use of insulin: Secondary | ICD-10-CM

## 2024-01-13 MED ORDER — TIRZEPATIDE 2.5 MG/0.5ML ~~LOC~~ SOAJ
2.5000 mg | SUBCUTANEOUS | 1 refills | Status: DC
Start: 2024-01-13 — End: 2024-04-02
  Filled 2024-01-13: qty 2, 28d supply, fill #0
  Filled 2024-03-12 (×2): qty 2, 28d supply, fill #1

## 2024-01-13 NOTE — Progress Notes (Signed)
 01/13/2024 Name: Scott Rosario MRN: [id number] DOB: [birth year]  Chief Complaint  Patient presents with   Medication Management   Medication Assistance    Scott Rosario is a 63 y.o. year old male who presented for a telephone visit.   They were referred to the pharmacist by their PCP for assistance in managing diabetes and medication access.      Subjective:   Care Team: Primary Care Provider: Raina Bunting, DO ; Next Scheduled Visit: 04/02/2024 Pulmonologist: Adelaida Holts, MD   Medication Access/Adherence  Current Pharmacy:  [facility], [place] - [address] [place] [address] Phone: [phone] Fax: [phone]  [facility] [address] [place] [address] Phone: [phone] Fax: [phone]  [id number] - [place], [place] - [address] [place] [address] Phone: [phone] Fax: [phone]   Patient reports affordability concerns with their medications: Yes  Patient reports access/transportation concerns to their pharmacy: No  Patient reports adherence concerns with their medications:  No    Diabetes:   Current medications:  - metformin  ER 750 mg twice daily - Reports ran out of Trulicity  0.75 mg just over a week ago   Note on 12/16/2023, PCP had sent order to pharmacy to increase patient's Trulicity  dose to 1.5 mg weekly. However, patient shares that Trulicity  cost was unaffordable when went to pick up this refill   Medications tried in the past: Reports stopped Rybelsus  as did not tolerate well (nausea); insulin  glargine; Invokana ; Ozempic  (acid reflux)   Current glucose readings: morning 150-160 since off of Trulicity  (reports previously running 110-120 on Trulicity ) - Reports using glucose checks as feedback on dietary choices    Reports working on weight loss   Patient denies hypoglycemic s/sx including dizziness, shakiness, sweating.   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 Sig Documenting Provider Last Dose Status Informant  albuterol  (PROVENTIL  HFA;VENTOLIN  HFA) 108 (90 Base) MCG/ACT inhaler [id number] No Inhale 2 puffs into the lungs every 4 (four) hours as needed for wheezing or shortness of breath. Alisa Irish, NP Taking Active Self  amLODipine  (NORVASC ) 10 MG tablet [id number] No Take 1 tablet (10 mg total) by mouth daily. Raina Bunting, DO Taking Active   ascorbic acid (VITAMIN C) 1000 MG tablet [id number] No Take 1 tablet by mouth 1 day or 1 dose. [provider] Taking Active   aspirin  EC 81 MG tablet [id number] No Take 81 mg by mouth daily. [provider] Taking Active Self  atorvastatin  (LIPITOR ) 80 MG tablet [id number]  No Take 1 tablet (80 mg total) by mouth at bedtime. Tapia, Leisa, PA-C Taking Active   baclofen  (LIORESAL ) 10 MG tablet [id number]  Take 0.5-1 tablets (5-10 mg total) by mouth 3 (three) times daily as needed for muscle spasms. Raina Bunting, DO  Active   folic acid-pyridoxine-cyancobalamin (FOLTX) 2.5-25-2 MG TABS tablet [id number] No Take 1 tablet by mouth daily. [provider] Taking Active   gabapentin  (NEURONTIN ) 300 MG  capsule [id number] No TAKE 1 CAPSULE BY MOUTH AT BEDTIME Raina Bunting, DO Taking Active   Discontinued 06/06/23 1011 (Change in therapy)   GVOKE HYPOPEN  2-PACK 1 MG/0.2ML SOAJ [id number] No Inject 1 mg into the skin as needed (hypoglycemia). Raina Bunting, DO Taking Active   Insulin  Pen Needle 32G X 4 MM MISC [id number] No Use as directed with insulin  daily Tapia, Leisa, PA-C Taking Active   losartan  (COZAAR ) 50 MG tablet [id number] No Take 1 tablet (50 mg total) by mouth every evening.  Patient taking differently: Take 50 mg by mouth 2 (two) times daily.   Alisa Irish, NP Taking Active   metFORMIN  (GLUCOPHAGE -XR) 750 MG 24 hr tablet [id number] No TAKE 2 TABLETS BY MOUTH ONCE DAILY WITH BREAKFAST  Patient taking differently: Take 750 mg by mouth 2 (two) times daily.   Raina Bunting, DO Taking Active   metoprolol  succinate (TOPROL -XL) 25 MG 24 hr tablet [id number] No Take 1 tablet (25 mg total) by mouth every morning. Alisa Irish, NP Taking Active   nitroGLYCERIN  (NITROSTAT ) 0.4 MG SL tablet [id number] No Place 1 tablet (0.4 mg total) under the tongue every 5 (five) minutes as needed for chest pain. Max of 3 total; call 911 Raina Bunting, DO Taking Active   pantoprazole  (PROTONIX ) 40 MG tablet [id number]  Take 1 tablet (40 mg total) by mouth 2 (two) times daily. Karamalegos, Kayleen Party, DO  Active   Tiotropium Bromide -Olodaterol (STIOLTO RESPIMAT) 2.5-2.5 MCG/ACT AERS [id number] No Inhale 2 inhalations into the lungs once daily [provider] Taking Active   tirzepatide  (MOUNJARO ) 2.5 MG/0.5ML Pen [id number] Yes Inject 2.5 mg into the skin once a week. Raina Bunting, DO  Active     Discontinued 01/13/24 1132 (Change in therapy)               Assessment/Plan:   Diabetes: - Currently uncontrolled - Reviewed long term cardiovascular and renal outcomes of uncontrolled blood sugar - Reviewed goal A1c, goal fasting, and  goal 2 hour post prandial glucose - Reviewed dietary modifications including importance of having regular well-balanced meals and snacks throughout the day, while controlling carbohydrate portion sizes - Collaborate with RPh Parthenia Bolt at [facility] regarding cost of Trulicity  prescription for patient. Find that patient has used up maximum savings limit for Trulicity  manufacturer e-voucher, making cost now unaffordable - Collaborate with CPhT Ayla McCormick to run test claims for therapeutic alternatives Appears that patient has ~$3,375/$7,000 deductible remaining Find that with manufacturer e-voucher, therapeutic alternative Mounjaro  cost is $25 for patient - Follow up with patient to provide update. He is interested in making switch from Trulicity  to Mounjaro  for affordability CPP sends prescription for Mounjaro  2.5 mg once weekly injection to [facility] for patient Advised patient to remain off of glipizide  - Counseled on Mounjaro , including mechanism of action, side effects, and benefits. No personal or family history of medullary thyroid cancer, personal history of pancreatitis or gallbladder disease. Counseled on potential side effects of nausea, stomach upset, queasiness, constipation,  and that these generally improve over time. Advised to contact our office with more severe symptoms, including nausea, diarrhea, stomach pain. Patient verbalized understanding. - Recommend to check glucose, keep log of results and have this record to review at upcoming medical appointments. Patient to contact provider office sooner if needed for readings outside of established parameters or symptoms   Follow Up Plan: Clinical Pharmacist will follow up with patient by telephone on 02/06/2024 at 10:30 AM      Arthur Lash, PharmD, [facility] [phone]

## 2024-01-13 NOTE — Patient Instructions (Signed)
 Goals Addressed             This Visit's Progress    Pharmacy Goals       Please continue to monitor your home blood sugar and give our office a call if you have any new symptoms or readings outside of the ranges that we discussed, particularly for any signs of low blood sugar.    Please feel free to give me a call sooner for any medication-related questions   Thank you!   Estelle Grumbles, PharmD, [place] [place], CPP Clinical Pharmacist [facility] [phone]

## 2024-01-13 NOTE — Telephone Encounter (Signed)
 Pharmacy Patient Advocate Encounter  Insurance verification completed.   The patient is insured through [facility]   Ran test claim for Mounjaro . Currently a quantity of 2 ml is a 28 day supply and the co-pay is $25.00 with copay card .  Ran test claim for Ozempic . Currently a quantity of 2 ml is a 28 day supply and the co-pay is $977.83, due to deductible .  This test claim was processed through [facility]- copay amounts may vary at other pharmacies due to pharmacy/plan contracts, or as the patient moves through the different stages of their insurance plan.

## 2024-01-16 ENCOUNTER — Ambulatory Visit
Admission: EM | Admit: 2024-01-16 | Discharge: 2024-01-16 | Disposition: A | Discharge status: Home / Self Care | Attending: Emergency Medicine | Admitting: Emergency Medicine

## 2024-01-16 ENCOUNTER — Ambulatory Visit: Payer: Self-pay

## 2024-01-16 DIAGNOSIS — M7521 Bicipital tendinitis, right shoulder: Secondary | ICD-10-CM

## 2024-01-16 DIAGNOSIS — M7541 Impingement syndrome of right shoulder: Secondary | ICD-10-CM

## 2024-01-16 DIAGNOSIS — L03114 Cellulitis of left upper limb: Secondary | ICD-10-CM

## 2024-01-16 MED ORDER — AMOXICILLIN 500 MG PO TABS: 500.0000 mg | ORAL_TABLET | Freq: Three times a day (TID) | ORAL | 0 refills | Status: DC

## 2024-01-16 NOTE — ED Provider Notes (Signed)
 HPI  SUBJECTIVE:  Scott Rosario is a 62 y.o. male who presents with 2 issues:  First, he reports daily sharp anterior shoulder pain for the past 3 weeks that became worse yesterday.  He has been doing a lot of upper body physical activity such as shoveling and driving heavy machinery.  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  He states the pain started shortly after increasing his physical activity.  No repetitive overhead motion.  No trauma, bruising, deformity, swelling, erythema, fevers, distal numbness, grip weakness.  He reports occasional tingling in his hand.  He tried 800 mg of ibuprofen  x 1 and Tylenol  with improvement in his symptoms.  Symptoms are worse with lifting and lying on his right side.  Second, he reports a tender, erythematous knot on his forearm starting this morning after scraping his left arm on a door 2 days ago.  He has been applying Band-Aid to the laceration.  He has a past medical history of aortic aneurysm, coronary artery disease, cirrhosis, COPD, diabetes, gastritis without bleeding, GERD, MI x 2 with stents, hyperlipidemia, and is on aspirin .  [facility]: [facility]   Past Medical History:  Diagnosis Date   Acute calculous cholecystitis    Alkaline phosphatase elevation    Aortic aneurysm (HCC) 12/10/2018   3.4 cm March 2020   Aortic atherosclerosis (HCC)    on CT 02/25/14   Asthma    CAD (coronary artery disease)    Cirrhosis (HCC)    Cirrhosis (HCC)    COPD (chronic obstructive pulmonary disease) (HCC)    Diabetes mellitus without complication (HCC)    Emphysema of lung (HCC)    Gallstone 12/10/2018   Gastritis without bleeding    GERD (gastroesophageal reflux disease)    Heart attack (HCC)    2005, 2017 (stent after each)   History of hiatal hernia    small   History of kidney stones    h/o   Hyperlipidemia    Hypertension    Ingrown toenail of both feet 11/29/2018   Pneumonia 2018   Pulmonary nodules    x 3, (5mm,  5mm, 4mm) chest CT February 25, 2014   Renal cyst    on CT 02/25/14   Tobacco use     Past Surgical History:  Procedure Laterality Date   APPENDECTOMY     CARDIAC CATHETERIZATION Left 03/09/2016   Procedure: Left Heart Cath and Coronary Angiography;  Surgeon: Michelle Aid, MD;  Location: [facility];  Service: Cardiovascular;  Laterality: Left;   CARDIAC CATHETERIZATION N/A 03/09/2016   Procedure: Coronary Stent Intervention;  Surgeon: Percival Brace, MD;  Location: [facility] INV[facility] CV [facility];  Service: Cardiovascular;  Laterality: N/A;   CHOLECYSTECTOMY N/A 01/31/2019   Procedure: LAPAROSCOPIC CHOLECYSTECTOMY - DIABETIC;  Surgeon: Franki Isles, MD;  Location: [facility] ORS;  Service: General;  Laterality: N/A;   COLONOSCOPY WITH PROPOFOL  N/A 03/22/2019   Procedure: COLONOSCOPY WITH PROPOFOL ;  Surgeon: Marnee Sink, MD;  Location: [facility];  Service: Endoscopy;  Laterality: N/A;  Diabetic - insulin  and oral meds   COLONOSCOPY WITH PROPOFOL  N/A 11/04/2020   Procedure: COLONOSCOPY WITH PROPOFOL ;  Surgeon: Irby Mannan, MD;  Location: [facility];  Service: Endoscopy;  Laterality: N/A;  COVID + 10-15-20   CORONARY ANGIOPLASTY WITH STENT PLACEMENT  10/25/2003   x 2   ESOPHAGOGASTRODUODENOSCOPY (EGD) WITH PROPOFOL  N/A 10/24/2017   Procedure: ESOPHAGOGASTRODUODENOSCOPY (EGD) WITH PROPOFOL ;  Surgeon: Marnee Sink,  MD;  Location: [facility];  Service: Endoscopy;  Laterality: N/A;  Diabetic - oral meds   ESOPHAGOGASTRODUODENOSCOPY (EGD) WITH PROPOFOL  N/A 05/01/2020   Procedure: ESOPHAGOGASTRODUODENOSCOPY (EGD) WITH PROPOFOL ;  Surgeon: Irby Mannan, MD;  Location: [facility];  Service: Endoscopy;  Laterality: N/A;   ESOPHAGOGASTRODUODENOSCOPY (EGD) WITH PROPOFOL  N/A 11/04/2020   Procedure: ESOPHAGOGASTRODUODENOSCOPY (EGD) WITH PROPOFOL  with biopsy;  Surgeon: Irby Mannan, MD;  Location: [facility];  Service: Endoscopy;  Laterality: N/A;    POLYPECTOMY  03/22/2019   Procedure: POLYPECTOMY;  Surgeon: Marnee Sink, MD;  Location: [facility];  Service: Endoscopy;;   POLYPECTOMY  11/04/2020   Procedure: POLYPECTOMY;  Surgeon: Irby Mannan, MD;  Location: [facility];  Service: Endoscopy;;    Family History  Problem Relation Age of Onset   Diabetes Mother    Hypertension Mother    Arthritis Mother    Hyperlipidemia Mother    Heart attack Father 55   Epilepsy Father    Diabetes Father    Seizures Father    Heart disease Father    Hyperlipidemia Father    Hypertension Father    Stroke Father    Healthy Sister    Diabetes Brother    Hyperlipidemia Brother    Hypertension Brother    Heart attack Brother 83   Coronary artery disease Brother    Cancer Maternal Grandmother        unknown   Diabetes Paternal Grandmother    Heart disease Paternal Grandmother    Hyperlipidemia Paternal Grandmother    Hypertension Paternal Grandmother    Heart attack Paternal Grandfather     Social History   Tobacco Use   Smoking status: Every Day    Current packs/day: 1.00    Average packs/day: 1 pack/day for 45.0 years (45.0 ttl pk-yrs)    Types: Cigarettes   Smokeless tobacco: Never   Tobacco comments:    since age 9  Vaping Use   Vaping status: Never Used  Substance Use Topics   Alcohol use: No    Alcohol/week: 0.0 standard drinks of alcohol   Drug use: No    No current facility-administered medications for this encounter.  Current Outpatient Medications:    amLODipine  (NORVASC ) 10 MG tablet, Take 1 tablet (10 mg total) by mouth daily., Disp: 90 tablet, Rfl: 1   amoxicillin  (AMOXIL ) 500 MG tablet, Take 1 tablet (500 mg total) by mouth 3 (three) times daily for 5 days., Disp: 15 tablet, Rfl: 0   ascorbic acid (VITAMIN C) 1000 MG tablet, Take 1 tablet by mouth 1 day or 1 dose., Disp: , Rfl:    aspirin  EC 81 MG tablet, Take 81 mg by mouth daily., Disp: , Rfl:    atorvastatin  (LIPITOR ) 80 MG tablet,  Take 1 tablet (80 mg total) by mouth at bedtime., Disp: 90 tablet, Rfl: 1   folic acid-pyridoxine-cyancobalamin (FOLTX) 2.5-25-2 MG TABS tablet, Take 1 tablet by mouth daily., Disp: , Rfl:    gabapentin  (NEURONTIN ) 300 MG capsule, TAKE 1 CAPSULE BY MOUTH AT BEDTIME, Disp: 90 capsule, Rfl: 0   insulin  glargine-yfgn (SEMGLEE ) 100 UNIT/ML Pen, Inject 10 Units into the skin., Disp: , Rfl:    metoprolol  succinate (TOPROL -XL) 25 MG 24 hr tablet, Take 1 tablet (25 mg total) by mouth every morning., Disp: 90 tablet, Rfl: 1   pantoprazole  (PROTONIX ) 40 MG tablet, Take 1 tablet (40 mg total) by mouth 2 (two) times daily., Disp: 180 tablet, Rfl: 1   tirzepatide  (MOUNJARO )  2.5 MG/0.5ML Pen, Inject 2.5 mg into the skin once a week., Disp: 2 mL, Rfl: 1   albuterol  (PROVENTIL  HFA;VENTOLIN  HFA) 108 (90 Base) MCG/ACT inhaler, Inhale 2 puffs into the lungs every 4 (four) hours as needed for wheezing or shortness of breath., Disp: 1 Inhaler, Rfl: 1   baclofen  (LIORESAL ) 10 MG tablet, Take 0.5-1 tablets (5-10 mg total) by mouth 3 (three) times daily as needed for muscle spasms., Disp: 30 each, Rfl: 0   GVOKE HYPOPEN  2-PACK 1 MG/0.2ML SOAJ, Inject 1 mg into the skin as needed (hypoglycemia)., Disp: 1 mL, Rfl: 0   Insulin  Pen Needle 32G X 4 MM MISC, Use as directed with insulin  daily, Disp: 100 each, Rfl: 1   losartan  (COZAAR ) 50 MG tablet, Take 1 tablet (50 mg total) by mouth every evening. (Patient taking differently: Take 50 mg by mouth 2 (two) times daily.), Disp: 90 tablet, Rfl: 1   metFORMIN  (GLUCOPHAGE -XR) 750 MG 24 hr tablet, TAKE 2 TABLETS BY MOUTH ONCE DAILY WITH BREAKFAST (Patient taking differently: Take 750 mg by mouth 2 (two) times daily.), Disp: 180 tablet, Rfl: 1   nitroGLYCERIN  (NITROSTAT ) 0.4 MG SL tablet, Place 1 tablet (0.4 mg total) under the tongue every 5 (five) minutes as needed for chest pain. Max of 3 total; call 911, Disp: 25 tablet, Rfl: 5   Tiotropium Bromide -Olodaterol (STIOLTO RESPIMAT)  2.5-2.5 MCG/ACT AERS, Inhale 2 inhalations into the lungs once daily, Disp: , Rfl:   Allergies  Allergen Reactions   Canagliflozin  Other (See Comments)    adverse reactions: Fatigue and Zombie feeling    Semaglutide  Diarrhea and Nausea And Vomiting    Ozempic   Acid Reflux   Tape     SURGICAL TAPE-RIPPED SKIN OFF     ROS  As noted in HPI.   Physical Exam  BP (!) 165/78 (BP Location: Left Arm)   Pulse 82   Temp 99.5 F (37.5 C) (Oral)   Resp 15   SpO2 97%   Constitutional: Well developed, well nourished, no acute distress Eyes:  EOMI, conjunctiva normal bilaterally HENT: Normocephalic, atraumatic,mucus membranes moist Respiratory: Normal inspiratory effort Cardiovascular: Normal rate GI: nondistended skin: Small laceration to the left forearm with no expressible purulent drainage but with surrounding blanchable erythema, edema, increased temperature.  Musculoskeletal: R  shoulder with ROM somewhat limited Drop test painful but negative,  clavicle NT, A/C joint NT, scapula NT, proximal humerus NT, trapezius   tender,  Motor strength normal , Sensation intact LT over deltoid region, distal NVI with hand having intact sensation and strength in the median, radial, and ulnar nerve distribution.   no pain with internal rotation, no pain with external rotation, Positive  tenderness in bicipital groove, positive  empty can test,  positive liftoff test,  RP 2+  Neurologic: Alert & oriented x 3, no focal neuro deficits Psychiatric: Speech and behavior appropriate   ED Course   Medications - No data to display  No orders of the defined types were placed in this encounter.   No results found for this or any previous visit (from the past 24 hours). No results found.  ED Clinical Impression  1. Rotator cuff impingement syndrome of right shoulder   2. Biceps tendinitis of right shoulder   3. Cellulitis of left forearm      ED Assessment/Plan      1.  Right shoulder  pain.  Appears to be rotator cuff overuse syndrome and a biceps tendinitis.  I doubt fracture or dislocation,  so deferring imaging today after doing shared medical decision making with the patient.  Discussed with him that he needs to discontinue the ibuprofen /Advil .  May take 1000 mg of Tylenol  maximum 3 times a day, ice or heat, whichever feels better.  Salonpas patches.Giving patient sample here.  Considered steroids, but given patient's history of diabetes, deferred this today.    Follow-up with [facility] in 5 to 7 days.  2.  Swelling left forearm.  Concern for cellulitis with the small laceration.  He has no history of MRSA, and there is no expressible pus.  Home with amoxicillin  1 g 3 times daily for 5 days.  Follow-up with PCP if not improving.  ER return precautions given.  Discussed  MDM, treatment plan, and plan for follow-up with patient. Discussed sn/sx that should prompt return to the ED. patient agrees with plan.   Meds ordered this encounter  Medications   amoxicillin  (AMOXIL ) 500 MG tablet    Sig: Take 1 tablet (500 mg total) by mouth 3 (three) times daily for 5 days.    Dispense:  15 tablet    Refill:  0      *This clinic note was created using Scientist, clinical (histocompatibility and immunogenetics). Therefore, there may be occasional mistakes despite careful proofreading.  ?    Ethlyn Herd, MD 01/17/24 [id number]

## 2024-01-16 NOTE — ED Triage Notes (Signed)
 Right shoulder pain x 3 weeks. Insure of injury. Pain got worse last night Left arm pain with knot.

## 2024-01-16 NOTE — Discharge Instructions (Signed)
 discontinue the ibuprofen /Advil .  May take 1000 mg of Tylenol  maximum 3 times a day, ice or heat, whichever feels better.  Salonpas patches.  I have given you a sample here today.  Follow-up with [facility] in 5 to 7 days.  You also appear to have a cellulitis/skin infection.  Finish the amoxicillin , even if you feel better.  Please follow-up with your primary care provider if it does not get better, go to the ER if it gets worse.

## 2024-01-16 NOTE — Telephone Encounter (Signed)
 Copied from CRM [id number]. Topic: Clinical - Red Word Triage >> Jan 16, 2024  9:15 AM Zipporah Him wrote: Red Word that prompted transfer to Nurse Triage: Left arm, woke up swollen states big around as a softball, painful and sore. Tender to the touch.  Chief Complaint: arm swelling Symptoms: left arm swelling located between elbow & wrist on FR side - swelling size of softball, warm to touch, 5/10 pain Frequency: today Pertinent Negatives: Patient denies no drainage Disposition: [] ED /[] Urgent Care (no appt availability in office) / [x] Appointment(In office/virtual)/ []  [place] [facility]/ [] Home Care/ [] Refused Recommended Disposition /[] [place] [facility]/ []  Follow-up with PCP Additional Notes: pt stated he woke up and noticed are swollen and painful Reason for Disposition  MODERATE arm swelling (e.g., puffiness or swollen feeling of entire arm)  Answer Assessment - Initial Assessment Questions 1. ONSET: "When did the swelling start?" (e.g., minutes, hours, days)     01/16/2024 2. LOCATION: "What part of the arm is swollen?"  "Are both arms swollen or just one arm?"     Left arm FR between elbow and wrist - size of softball 3. SEVERITY: "How bad is the swelling?" (e.g., localized; mild, moderate, severe)   - LOCALIZED: Small area of puffiness or swelling on just one arm   - JOINT SWELLING: Swelling of one joint   - MILD: Puffiness or swelling of hand   - MODERATE: Puffiness or swollen feeling of entire arm    - SEVERE: All of arm looks swollen; pitting edema     Mild - moderate 4. REDNESS: "Does the swelling look red or infected?"     unknown 5. PAIN: "Is the swelling painful to touch?" If Yes, ask: "How painful is it?"   (Scale 1-10; mild, moderate or severe)     4/10 6. FEVER: "Do you have a fever?" If Yes, ask: "What is it, how was it measured, and when did it start?"      Arm feels warm to touch 7. CAUSE: "What do you think is causing the arm swelling?"     Possible insect  bite 8. MEDICAL HISTORY: "Do you have a history of heart failure, kidney disease, liver failure, or cancer?"     no 9. RECURRENT SYMPTOM: "Have you had arm swelling before?" If Yes, ask: "When was the last time?" "What happened that time?"     No 10. OTHER SYMPTOMS: "Do you have any other symptoms?" (e.g., chest pain, difficulty breathing)       N/a 11. PREGNANCY: "Is there any chance you are pregnant?" "When was your last menstrual period?"       N/a  Protocols used: Arm Swelling and Edema-A-AH

## 2024-01-17 ENCOUNTER — Encounter: Payer: Self-pay | Admitting: Family Medicine

## 2024-01-17 ENCOUNTER — Ambulatory Visit (INDEPENDENT_AMBULATORY_CARE_PROVIDER_SITE_OTHER): Admitting: Family Medicine

## 2024-01-17 VITALS — BP 148/62 | HR 78 | Resp 19 | Ht 68.0 in | Wt 180.0 lb

## 2024-01-17 DIAGNOSIS — L03114 Cellulitis of left upper limb: Secondary | ICD-10-CM

## 2024-01-17 DIAGNOSIS — M7551 Bursitis of right shoulder: Secondary | ICD-10-CM | POA: Diagnosis not present

## 2024-01-17 MED ORDER — SULFAMETHOXAZOLE-TRIMETHOPRIM 800-160 MG PO TABS: 1.0000 | ORAL_TABLET | Freq: Two times a day (BID) | ORAL | 0 refills | Status: AC

## 2024-01-17 NOTE — Patient Instructions (Addendum)
 Thank you for coming to the office today.  Most likely you have bursitis of your shoulder. This is inflammation of the shoulder joint caused most often by arthritis or wear and tear. Often it can flare up to cause bursitis due to repetitive activities or other triggers. It may take time to heal, possibly 2 to 6 weeks, and it is important to avoid over use of shoulder especially above head motions that can re-aggravate the problem.  START anti inflammatory topical - OTC Voltaren (generic Diclofenac) topical 2-4 times a day as needed for pain swelling of affected joint for 1-2 weeks or longer.  Recommend to start taking Tylenol  Extra Strength 500mg  tabs - take 1 to 2 tabs per dose (max 1000mg ) every 6-8 hours for pain (take regularly, don't skip a dose for next 7 days), max 24 hour daily dose is 6 tablets or 3000mg . In the future you can repeat the same everyday Tylenol  course for 1-2 weeks at a time.   If not improving we can do an X-ray or injection in the future Consider referral to Physical Therapy or Orthopedic in future if need  ---------------------  For cellulitis Stop Amoxicillin  Switch to Bactrim  twice a day for 10 days  If not improving you may need to return for re-evaluation. But if more severe worsening such as spreading redness or streaking redness, significantly larger size, persistent drainage of pus, increased pain, fevers/chills, nausea vomiting and cannot take antibiotic. If significantly worse symptoms or most of these symptoms, would recommend going straight to Hospital Emergency Dept as you may require IV antibiotics instead.  -------------  Leg cramps - Try electrolyte rehydration.  - Try spoonful of yellow mustard to relieve leg cramps or try daily to prevent the problem  - OTC natural option is Hyland's Leg Cramps (Dissolving tablet) take as needed for muscle cramps   Please schedule a Follow-up Appointment to: No follow-ups on file.  If you have any other  questions or concerns, please feel free to call the office or send a message through MyChart. You may also schedule an earlier appointment if necessary.  Additionally, you may be receiving a survey about your experience at our office within a few days to 1 week by e-mail or mail. We value your feedback.  Domingo Friend, DO [facility], [facility]  Range of Motion Shoulder Exercises  Pendulum Circles - Lean with your good arm against a counter or table for support [place] [place] forward with a wide stance (make sure your body is comfortable) - Your painful shoulder should hang down and feel "heavy" - Gently move your painful arm in small circles "clockwise" for several turns - Switch to "counterclockwise" for several turns - Early on keep circles narrow and move slowly - Later in rehab, move in larger circles and faster movement   Wall Crawl - Stand close (about 1-2 ft away) to a wall, facing it directly - Reach out with your arm of painful shoulder and place fingers (not palm) on wall - You should make contact with wall at your waist level - Slowly walk your fingers up the wall. Stay in contact with wall entire time, do not remove fingers - Keep walking fingers up wall until you reach shoulder level - You may feel tightening or mild discomfort, once you reach a height that causes pain or if you are already above your shoulder height then stop. Repeat from starting position. - Early on stand closer to wall, move fingers slowly, and  stay at or below shoulder level - Later in rehab, stand farther away from wall (fingertips), move fingers quicker, go above shoulder level

## 2024-01-17 NOTE — Progress Notes (Addendum)
 Subjective:    Patient ID: Scott Rosario, male    DOB: Feb 19, 1961, 63 y.o.   MRN: [id number]  Scott Rosario is a 63 y.o. male presenting on 01/17/2024 for Arm Pain (Left arm pain, swelling and redness for 2 days)   HPI   Discussed the use of AI scribe software for clinical note transcription with the patient, who gave verbal consent to proceed.  History of Present Illness   Scott Rosario is a 63 year old male who presents with left forearm swelling and right shoulder pain.  UC FOLLOW-UP VISIT  Hospital/Location: [facility] Date of UC Visit: 01/16/24  Reason for Presenting to UC R Shoulder Pain L forearm swelling pain  FOLLOW-UP  - ED provider note and record have been reviewed - Patient presents today about 1 day after recent UC visit.  He has experienced swelling in his left forearm since yesterday morning, initially, which has now increased from its original size. The area is erythematous, very sore, hot, and bumpy. He started an antibiotic regimen yesterday, taking two doses yesterday and two today, but is concerned about its efficacy as the swelling continues to grow, particularly towards the wrist and hand. The swelling is painful when pressure is applied. No fever, chills, nausea, or vomiting.  He has a history of right shoulder pain that began three weeks ago, with significant worsening on Sunday. Tylenol  has been used for pain management but with limited effectiveness. He was advised to avoid Advil  due to liver health concerns, although he took it on Saturday. The pain is exacerbated by overhead lifting motions and is alleviated by keeping his arm down by his side. He received a lidocaine  patch at urgent care. No imaging has been performed.  He has a history of acid reflux, which complicates the use of certain medications like ibuprofen  due to the risk of stomach ulcers. He also experiences muscle cramps in his ankles, feet, and calves, attributed to his outdoor  work. Gabapentin  helps with the cramps but causes grogginess if he takes two pills. He works outdoors frequently, exposed to sun for extended periods.   - New medications on discharge: Amoxicillin  - Changes to current meds on discharge: none   I have reviewed the discharge medication list, and have reconciled the current and discharge medications today.       01/17/2024    2:31 PM 09/30/2023   10:09 AM 09/15/2023    3:08 PM  Depression screen PHQ 2/9  Decreased Interest 0 0 0  Down, Depressed, Hopeless 0 0 0  PHQ - 2 Score 0 0 0  Altered sleeping 0 3 3  Tired, decreased energy 0 3 2  Change in appetite 0 0 0  Feeling bad or failure about yourself  0 0 0  Trouble concentrating 0 0 0  Moving slowly or fidgety/restless 0 1 0  Suicidal thoughts 0 0 0  PHQ-9 Score 0 7 5  Difficult doing work/chores Not difficult at all         01/17/2024    2:31 PM 09/30/2023   10:09 AM 09/15/2023    3:09 PM  GAD 7 : Generalized Anxiety Score  Nervous, Anxious, on Edge 0 0 2  Control/stop worrying 0 0 2  Worry too much - different things 0 0 2  Trouble relaxing 0 0 2  Restless 0 0 2  Easily annoyed or irritable 0 0 2  Afraid - awful might happen 0 0 0  Total GAD 7  Score 0 0 12  Anxiety Difficulty Not difficult at all      Social History   Tobacco Use   Smoking status: Every Day    Current packs/day: 1.00    Average packs/day: 1 pack/day for 45.0 years (45.0 ttl pk-yrs)    Types: Cigarettes   Smokeless tobacco: Never   Tobacco comments:    since age 47  Vaping Use   Vaping status: Never Used  Substance Use Topics   Alcohol use: No    Alcohol/week: 0.0 standard drinks of alcohol   Drug use: No    Review of Systems Per HPI unless specifically indicated above     Objective:    BP (!) 148/62 (BP Location: Right Arm, Cuff Size: Normal)   Pulse 78   Resp 19   Ht 5\' 8"  (1.727 m)   Wt 180 lb (81.6 kg)   SpO2 95%   BMI 27.37 kg/m   Wt Readings from Last 3 Encounters:   01/17/24 180 lb (81.6 kg)  12/23/23 181 lb (82.1 kg)  12/16/23 181 lb (82.1 kg)    Physical Exam Vitals and nursing note reviewed.  Constitutional:      General: He is not in acute distress.    Appearance: He is well-developed. He is not diaphoretic.     Comments: Well-appearing, comfortable, cooperative  HENT:     Head: Normocephalic and atraumatic.  Eyes:     General:        Right eye: No discharge.        Left eye: No discharge.     Conjunctiva/sclera: Conjunctivae normal.  Neck:     Thyroid: No thyromegaly.  Cardiovascular:     Rate and Rhythm: Normal rate and regular rhythm.     Pulses: Normal pulses.     Heart sounds: Normal heart sounds. No murmur heard. Pulmonary:     Effort: Pulmonary effort is normal. No respiratory distress.     Breath sounds: Normal breath sounds. No wheezing or rales.  Musculoskeletal:     Cervical back: Normal range of motion and neck supple.     Comments: Reduced forward flexion on R shoulder with pain. Positive impingement testing.  Lymphadenopathy:     Cervical: No cervical adenopathy.  Skin:    General: Skin is warm and dry.     Findings: Erythema (left forearm with notable erythema edema and warmth) present. No rash.  Neurological:     Mental Status: He is alert and oriented to person, place, and time. Mental status is at baseline.  Psychiatric:        Behavior: Behavior normal.     Comments: Well groomed, good eye contact, normal speech and thoughts     Results for orders placed or performed during the hospital encounter of 12/23/23  Urine Culture   Collection Time: 12/23/23  7:14 PM   Specimen: Urine, Random  Result Value Ref Range   Specimen Description      URINE, RANDOM Performed at [facility], [address], [place], [place] [address]    Special Requests      NONE Reflexed from [id number] Performed at [facility], [address], [place], [place] [address]    Culture >=100,000 COLONIES/mL  ESCHERICHIA COLI (A)    Report Status 12/25/2023 FINAL    Organism ID, Bacteria ESCHERICHIA COLI (A)       Susceptibility   Escherichia coli - MIC*    AMPICILLIN <=2 SENSITIVE Sensitive  CEFAZOLIN  <=4 SENSITIVE Sensitive     CEFEPIME <=0.12 SENSITIVE Sensitive     CEFTRIAXONE  <=0.25 SENSITIVE Sensitive     CIPROFLOXACIN <=0.25 SENSITIVE Sensitive     GENTAMICIN <=1 SENSITIVE Sensitive     IMIPENEM <=0.25 SENSITIVE Sensitive     NITROFURANTOIN <=16 SENSITIVE Sensitive     TRIMETH /SULFA  <=20 SENSITIVE Sensitive     AMPICILLIN/SULBACTAM <=2 SENSITIVE Sensitive     PIP/TAZO <=4 SENSITIVE Sensitive ug/mL    * >=100,000 COLONIES/mL ESCHERICHIA COLI  Urinalysis, w/ Reflex to Culture (Infection Suspected) -Urine, Clean Catch   Collection Time: 12/23/23  7:14 PM  Result Value Ref Range   Specimen Source URINE, CLEAN CATCH    Color, Urine ORANGE (A) YELLOW   APPearance HAZY (A) CLEAR   Specific Gravity, Urine  1.005 - 1.030    TEST NOT REPORTED DUE TO COLOR INTERFERENCE OF URINE PIGMENT   pH  5.0 - 8.0    TEST NOT REPORTED DUE TO COLOR INTERFERENCE OF URINE PIGMENT   Glucose, UA (A) NEGATIVE mg/dL    TEST NOT REPORTED DUE TO COLOR INTERFERENCE OF URINE PIGMENT   Hgb urine dipstick (A) NEGATIVE    TEST NOT REPORTED DUE TO COLOR INTERFERENCE OF URINE PIGMENT   Bilirubin Urine (A) NEGATIVE    TEST NOT REPORTED DUE TO COLOR INTERFERENCE OF URINE PIGMENT   Ketones, ur (A) NEGATIVE mg/dL    TEST NOT REPORTED DUE TO COLOR INTERFERENCE OF URINE PIGMENT   Protein, ur (A) NEGATIVE mg/dL    TEST NOT REPORTED DUE TO COLOR INTERFERENCE OF URINE PIGMENT   Nitrite (A) NEGATIVE    TEST NOT REPORTED DUE TO COLOR INTERFERENCE OF URINE PIGMENT   Leukocytes,Ua (A) NEGATIVE    TEST NOT REPORTED DUE TO COLOR INTERFERENCE OF URINE PIGMENT   Squamous Epithelial / HPF 0-5 0 - 5 /HPF   WBC, UA >50 0 - 5 WBC/hpf   RBC / HPF 21-50 0 - 5 RBC/hpf   Bacteria, UA MANY (A) NONE SEEN   WBC Clumps PRESENT     Mucus PRESENT       Assessment & Plan:   Problem List Items Addressed This Visit   None Visit Diagnoses       Cellulitis of left forearm    -  Primary   Relevant Medications   sulfamethoxazole -trimethoprim  (BACTRIM  DS) 800-160 MG tablet     Bursitis of right shoulder            Cellulitis of left forearm Acute cellulitis with significant swelling, redness, and tenderness. Initial amoxicillin  treatment was insufficient recently prescribed now with worsening. He has localized break in skin that has healed. No ulceration. Primary concern is cellulitis due to severity and progression. Risk of systemic infection requires close monitoring.  Considered possible superficial thrombophlebitis but given distribution and appearance and history, seems more likely cellulitis - Discontinue amoxicillin . - Initiate Bactrim  twice daily for ten days. - Monitor for systemic infection signs: fever, chills, nausea, vomiting, increased swelling/redness. - Advise hospital admission for IV antibiotics if symptoms worsen or no improvement in 48 hours. - Recommend ice application to reduce swelling and discomfort.  Right Rotator cuff tendinitis Chronic tendinitis exacerbated by overuse. Previous Tylenol  treatment ineffective. Topical anti-inflammatory preferred due to potential GI side effects of oral NSAIDs and acid reflux. - Recommend topical Voltaren (diclofenac) up to four times daily. - Advise alternating ibuprofen  and Tylenol  for pain management, considering GI side effects. - Provide shoulder rehabilitation exercises. - Consider x-ray or orthopedic  referral if no improvement. - Consider joint injection in future, caution risk of hyperglycemia  Muscle cramps Recurrent cramps likely related to physical activity and possible electrolyte imbalance. Gabapentin  provides relief but causes drowsiness. Non-pharmacological remedies preferred. - Advise on hydration, electrolyte supplementation, and mustard  for cramps. - Discuss potential use of muscle relaxants for shoulder pain, noting drowsiness risk.        No orders of the defined types were placed in this encounter.   Meds ordered this encounter  Medications   sulfamethoxazole -trimethoprim  (BACTRIM  DS) 800-160 MG tablet    Sig: Take 1 tablet by mouth 2 (two) times daily for 10 days.    Dispense:  20 tablet    Refill:  0    Follow up plan: Return if symptoms worsen or fail to improve.   Domingo Friend, DO [facility] 01/17/2024, 2:57 PM

## 2024-01-18 ENCOUNTER — Emergency Department

## 2024-01-18 ENCOUNTER — Other Ambulatory Visit: Payer: Self-pay

## 2024-01-18 ENCOUNTER — Ambulatory Visit: Payer: Self-pay

## 2024-01-18 ENCOUNTER — Emergency Department
Admission: EM | Admit: 2024-01-18 | Discharge: 2024-01-18 | Disposition: A | Discharge status: Home / Self Care | Attending: Emergency Medicine | Admitting: Emergency Medicine

## 2024-01-18 DIAGNOSIS — E871 Hypo-osmolality and hyponatremia: Secondary | ICD-10-CM | POA: Insufficient documentation

## 2024-01-18 DIAGNOSIS — L0390 Cellulitis, unspecified: Secondary | ICD-10-CM

## 2024-01-18 DIAGNOSIS — L03114 Cellulitis of left upper limb: Secondary | ICD-10-CM | POA: Insufficient documentation

## 2024-01-18 DIAGNOSIS — S51812A Laceration without foreign body of left forearm, initial encounter: Secondary | ICD-10-CM | POA: Diagnosis not present

## 2024-01-18 DIAGNOSIS — D72829 Elevated white blood cell count, unspecified: Secondary | ICD-10-CM | POA: Insufficient documentation

## 2024-01-18 DIAGNOSIS — R2232 Localized swelling, mass and lump, left upper limb: Secondary | ICD-10-CM | POA: Diagnosis not present

## 2024-01-18 LAB — BASIC METABOLIC PANEL WITH GFR
Anion gap: 8 (ref 5–15)
BUN: 14 mg/dL (ref 8–23)
CO2: 21 mmol/L — ABNORMAL LOW (ref 22–32)
Calcium: 9.1 mg/dL (ref 8.9–10.3)
Chloride: 105 mmol/L (ref 98–111)
Creatinine, Ser: 0.81 mg/dL (ref 0.61–1.24)
GFR, Estimated: >60 mL/min (ref >60)
Glucose, Bld: 162 mg/dL — ABNORMAL HIGH (ref 70–99)
Potassium: 4 mmol/L (ref 3.5–5.1)
Sodium: 134 mmol/L — ABNORMAL LOW (ref 135–145)

## 2024-01-18 LAB — CBC WITH DIFFERENTIAL/PLATELET
Abs Immature Granulocytes: 0.06 10*3/uL (ref 0.00–0.07)
Basophils Absolute: 0.1 10*3/uL (ref 0.0–0.1)
Basophils Relative: 1 %
Eosinophils Absolute: 0.2 10*3/uL (ref 0.0–0.5)
Eosinophils Relative: 2 %
HCT: 38.1 % — ABNORMAL LOW (ref 39.0–52.0)
Hemoglobin: 13.2 g/dL (ref 13.0–17.0)
Immature Granulocytes: 1 %
Lymphocytes Relative: 14 %
Lymphs Abs: 1.8 10*3/uL (ref 0.7–4.0)
MCH: 30.1 pg (ref 26.0–34.0)
MCHC: 34.6 g/dL (ref 30.0–36.0)
MCV: 86.8 fL (ref 80.0–100.0)
Monocytes Absolute: 1.2 10*3/uL — ABNORMAL HIGH (ref 0.1–1.0)
Monocytes Relative: 10 %
Neutro Abs: 9.2 10*3/uL — ABNORMAL HIGH (ref 1.7–7.7)
Neutrophils Relative %: 72 %
Platelets: 252 10*3/uL (ref 150–400)
RBC: 4.39 MIL/uL (ref 4.22–5.81)
RDW: 13 % (ref 11.5–15.5)
WBC: 12.5 10*3/uL — ABNORMAL HIGH (ref 4.0–10.5)
nRBC: 0 % (ref 0.0–0.2)

## 2024-01-18 MED ORDER — VANCOMYCIN HCL IN DEXTROSE 1-5 GM/200ML-% IV SOLN
1000.0000 mg | Freq: Once | INTRAVENOUS | Status: AC
  Administered 2024-01-18: 1000 mg via INTRAVENOUS
  Filled 2024-01-18: qty 200

## 2024-01-18 NOTE — ED Triage Notes (Signed)
 Pt presents to the ED POV from home with significant other. Pt reports cutting his left FA while outside working on Saturday and a knot appearing on Sunday. Arm has progressively become red and swollen since. Wound has since began to heal. Strong radial pulse.  Lactic and blue top sent to lab

## 2024-01-18 NOTE — ED Notes (Signed)
 Patient returned to [facility] from [facility]  and he ambulated to the bathroom.

## 2024-01-18 NOTE — ED Notes (Signed)
 Patients left forearm is warm to the touch and red in appearance. Patient is able to move extremity freely. Patient states he does not know when his last Tetanus vaccine was administered. Unsure if it was within the last 10 years. Wife sitting at bedside.

## 2024-01-18 NOTE — ED Notes (Signed)
Transferred to [facility]

## 2024-01-18 NOTE — ED Provider Notes (Signed)
 [facility] Provider Note    Event Date/Time   First MD Initiated Contact with Patient 01/18/24 1101     (approximate)   History   Arm redness   HPI  Scott Rosario is a 63 y.o. male who presents to the emergency department today because of concerns for worsening redness and swelling to his left upper arm.  The patient had cut his arm 4 days ago.  Since then he has noticed increasing swelling and redness to his left arm.  Saw his doctor yesterday who diagnosed him with cellulitis and ordered him Bactrim .  He took 1 dose yesterday.  This morning when he woke up he noticed the redness and swelling had gotten worse.  He called back to his doctor's office who recommended presentation for the ER given concern for possible worsening cellulitis or blood clot.     Physical Exam   Triage Vital Signs: ED Triage Vitals  Encounter Vitals Group     BP 01/18/24 1037 (!) 143/66     Systolic BP Percentile --      Diastolic BP Percentile --      Pulse Rate 01/18/24 1037 79     Resp 01/18/24 1037 18     Temp 01/18/24 1037 98.2 F (36.8 C)     Temp Source 01/18/24 1037 Oral     SpO2 01/18/24 1037 98 %     Weight 01/18/24 1041 178 lb (80.7 kg)     Height 01/18/24 1041 5\' 8"  (1.727 m)     Head Circumference --      Peak Flow --      Pain Score 01/18/24 1040 6     Pain Loc --      Pain Education --      Exclude from Growth Chart --     Most recent vital signs: Vitals:   01/18/24 1037  BP: (!) 143/66  Pulse: 79  Resp: 18  Temp: 98.2 F (36.8 C)  SpO2: 98%   General: Awake, alert, oriented. CV:  Good peripheral perfusion.  Resp:  Normal effort.  Abd:  No distention.  Other:  Left arm with erythema and swelling primarily in the forearm with slight erythema extending proximal to the elbow. RP 2+   ED Results / Procedures / Treatments   Labs (all labs ordered are listed, but only abnormal results are displayed) Labs Reviewed  CBC WITH  DIFFERENTIAL/PLATELET - Abnormal; Notable for the following components:      Result Value   WBC 12.5 (*)    HCT 38.1 (*)    Neutro Abs 9.2 (*)    Monocytes Absolute 1.2 (*)    All other components within normal limits  BASIC METABOLIC PANEL WITH GFR - Abnormal; Notable for the following components:   Sodium 134 (*)    CO2 21 (*)    Glucose, Bld 162 (*)    All other components within normal limits     EKG  None   RADIOLOGY I independently interpreted and visualized the US  venous upper extremity. My interpretation: No blood clot Radiology interpretation:  IMPRESSION:  No evidence of deep venous thrombosis within the left upper  extremity.     PROCEDURES:  Critical Care performed: No  MEDICATIONS ORDERED IN ED: Medications - No data to display   IMPRESSION / MDM / ASSESSMENT AND PLAN / ED COURSE  I reviewed the triage vital signs and the nursing notes.  Differential diagnosis includes, but is not limited to, cellulitis, DVT  Patient's presentation is most consistent with acute presentation with potential threat to life or bodily function.  Patient presented to the emergency department today because of concerns for worsening erythema and swelling to his left forearm.  On exam does have erythema and warmth.  Ultrasound was obtained which did not show any DVT.  This time I do think cellulitis likely.  Patient was given dose of IV vancomycin  here.  I think it is appropriate for patient be discharged to continue oral antibiotics prescribed by primary care.     FINAL CLINICAL IMPRESSION(S) / ED DIAGNOSES   Final diagnoses:  Cellulitis, unspecified cellulitis site     Note:  This document was prepared using Dragon voice recognition software and may include unintentional dictation errors.    Marylynn Soho, MD 01/18/24 [phone]

## 2024-01-18 NOTE — Telephone Encounter (Signed)
 Chief Complaint: arm swelling and redness Symptoms: see above Frequency: symptoms since Monday, worsening this AM Pertinent Negatives: Patient denies CP, SOB, dizziness, calf pain, leg swelling, N/V Disposition: [x] ED /[] Urgent Care (no appt availability in office) / [] Appointment(In office/virtual)/ []  [facility]/ [] Home Care/ [] Refused Recommended Disposition /[] [facility]/ []  Follow-up with PCP Additional Notes: Pt reports worsening cellulitis. Pt awoke this AM with worsening redness and swelling to the L forearm. Pt states the redness is moving up his arm towards his armpit. Pt's symptoms started Monday. Pt was seen yesterday in the office and prescribed Bactrim  which he has taken but symptoms have worsened. Pt reports 6/10 pain with the arm at rest and 12/10 pain when he touches his arm. Pt states last night he had chills and was underneath a blanket when the temperature in the house was 78 degrees. Pt states he did not check his temperature. Pt denies CP, SOB, heart palpitations, diaphoresis, N/V, dizziness, weakness, calf pain, leg swelling. EN advised pt he needs to go to the ED. RN also called the [facility] and spoke with pt's provider who agreed with the disposition. Pt is agreeable to go to the ED and states he will head there now. RN advised pt if he develops SOB, CP, heart palpitations, dizziness, diaphoresis, N/V, or any worsening to call 911. Pt verbalized understanding.      Copied from CRM [id number]. Topic: Clinical - Red Word Triage >> Jan 18, 2024  9:11 AM Lizabeth Riggs wrote: Red Word that prompted transfer to Nurse Triage:  Scott Rosario has Cellulitis of left forearm; he came to doctor yesterday. This morning the cellulitis is worse. It his going up to his arm pit and this happen over night. He wants to know what to do Reason for Disposition  [1] Red area or streak AND [2] fever  Additional Information  Negative: [1] Difficulty breathing with exertion (e.g., walking) AND  [2] new-onset or worsening    Pt reported chills, presumed fever last night.  Answer Assessment - Initial Assessment Questions 1. ONSET: "When did the swelling start?" (e.g., minutes, hours, days)     Redness and swelling worsened overnight. Pt was seen yesterday and prescribed Bactrim . Symptoms since Monday AM 2. LOCATION: "What part of the arm is swollen?"  "Are both arms swollen or just one arm?"     Left forearm swelling which has worsened. Redness is spreading and is up to his armpit. Wrist past elbow involved 3. SEVERITY: "How bad is the swelling?" (e.g., localized; mild, moderate, severe)   - LOCALIZED: Small area of puffiness or swelling on just one arm   - JOINT SWELLING: Swelling of one joint   - MILD: Puffiness or swelling of hand   - MODERATE: Puffiness or swollen feeling of entire arm    - SEVERE: All of arm looks swollen; pitting edema     Left forearm swelling - moderate  4. REDNESS: "Does the swelling look red or infected?"     Redness spreading since yesterday. Today when he woke up it was worse. No drainage. Last night pt reports he had cold chills and it was 78 degrees. Pt did not check his temperature. Pt states he was wrapped in a blanket. Skin is hot to the touch 5. PAIN: "Is the swelling painful to touch?" If Yes, ask: "How painful is it?"   (Scale 1-10; mild, moderate or severe)     "Still very sore" - 6-7/10. "If I touch it it's a 12" 6.  FEVER: "Do you have a fever?" If Yes, ask: "What is it, how was it measured, and when did it start?"      Endorses chills - did not check his temperature  7. CAUSE: "What do you think is causing the arm swelling?"     Dx with cellulitis  8. MEDICAL HISTORY: "Do you have a history of heart failure, kidney disease, liver failure, or cancer?"     HTN, CAD, has had 2 heart attacks 9. RECURRENT SYMPTOM: "Have you had arm swelling before?" If Yes, ask: "When was the last time?" "What happened that time?"     This is the first time 10.  OTHER SYMPTOMS: "Do you have any other symptoms?" (e.g., chest pain, difficulty breathing)       Denies CP and SOB. Denies heart palpitations. "Everything seems to be functioning normal." Denies dizziness and weakness. Denies leg swelling. "I had a real bad pain in my L leg Monday and Tuesday but this AM it's a lot better, I can actually walk now." Pain was at the top of the leg at the hip joint. States this has resolved. Denies N/V  Protocols used: Arm Swelling and Edema-A-AH

## 2024-02-06 ENCOUNTER — Other Ambulatory Visit (INDEPENDENT_AMBULATORY_CARE_PROVIDER_SITE_OTHER): Admitting: Pharmacist

## 2024-02-06 ENCOUNTER — Other Ambulatory Visit: Payer: Self-pay | Admitting: Family Medicine

## 2024-02-06 DIAGNOSIS — I10 Essential (primary) hypertension: Secondary | ICD-10-CM

## 2024-02-06 DIAGNOSIS — I714 Abdominal aortic aneurysm, without rupture, unspecified: Secondary | ICD-10-CM

## 2024-02-06 DIAGNOSIS — E782 Mixed hyperlipidemia: Secondary | ICD-10-CM

## 2024-02-06 DIAGNOSIS — Z794 Long term (current) use of insulin: Secondary | ICD-10-CM

## 2024-02-06 DIAGNOSIS — Z7985 Long-term (current) use of injectable non-insulin antidiabetic drugs: Secondary | ICD-10-CM

## 2024-02-06 DIAGNOSIS — I700 Atherosclerosis of aorta: Secondary | ICD-10-CM

## 2024-02-06 DIAGNOSIS — I2510 Atherosclerotic heart disease of native coronary artery without angina pectoris: Secondary | ICD-10-CM

## 2024-02-06 DIAGNOSIS — E1169 Type 2 diabetes mellitus with other specified complication: Secondary | ICD-10-CM

## 2024-02-06 NOTE — Progress Notes (Unsigned)
 02/06/2024 Name: Scott Rosario MRN: [id number] DOB: [birth year]  Chief Complaint  Patient presents with   Medication Management   Medication Assistance    Scott Rosario is a 63 y.o. year old male who presented for a telephone visit.   They were referred to the pharmacist by their PCP for assistance in managing diabetes and medication access.      Subjective:   Care Team: Primary Care Provider: Raina Bunting, DO ; Next Scheduled Visit: 04/02/2024 Pulmonologist: Adelaida Holts, MD   Medication Access/Adherence  Current Pharmacy:  [facility],  - [address] [place] [address] Phone: [phone] Fax: [phone]  [facility] - [facility] [address] [place] [address] Phone: [phone] Fax: [phone]  [id number] - [place], [place] - [address] [place] [address] Phone: [phone] Fax: [phone]   Patient reports affordability concerns with their medications: Yes  Patient reports access/transportation concerns to their pharmacy: No  Patient reports adherence concerns with their medications:  No     From review of chart, note patient seen for Office Visit with PCP on 01/17/2024 and in ED at [facility] on 01/18/2024 related to cellulitis  Today reports arm swelling and redness if left arm resolved with completing course of Bactrim  DS as prescribed by PCP  Diabetes:   Current medications:  - metformin  ER 750 mg twice daily - Mounjaro  2.5 mg weekly   Reports tolerating well              Medications tried in the past: Reports stopped Rybelsus  as did not tolerate well (nausea); insulin  glargine; Invokana ; Ozempic  (acid reflux)   Current glucose readings: morning 120-150 - Reports using glucose checks as feedback on dietary choices    Reports working on weight loss; reports has noticed impact on appetite with Mounjaro  - Current weight ~171  lbs   Patient denies hypoglycemic s/sx including dizziness, shakiness, sweating.  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 - Unclear why/when patient stopped taking, but per review of dispensing history, appears prescription last filled 03/15/2022 for 90 day supply  Denies monitoring home blood pressure recently  Patient denies hypotensive s/sx including dizziness, lightheadedness.   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     Objective:  Lab Results  Component Value Date   HGBA1C 7.1 (A) 12/16/2023    Lab Results  Component Value Date   CREATININE 0.81 01/18/2024   BUN 14 01/18/2024   NA 134 (L) 01/18/2024   K 4.0 01/18/2024   CL 105 01/18/2024   CO2 21 (L) 01/18/2024    Lab Results  Component Value Date   CHOL 86 06/06/2019   HDL 29 (L) 06/06/2019   LDLCALC 39 06/06/2019   TRIG 96 06/06/2019   CHOLHDL 3.0 06/06/2019   BP Readings from Last 3 Encounters:  01/18/24 (!) 143/66  01/17/24 (!) 148/62  01/16/24 (!) 165/78   Pulse Readings from Last 3 Encounters:  01/18/24 79  01/17/24 78  01/16/24 82     Medications Reviewed Today     Reviewed by Ardis Becton, RPH-CPP (Pharmacist) on 02/06/24 at 1049  Med List Status: <None>   Medication Order Taking? Sig Documenting Provider Last Dose Status Informant  albuterol  (PROVENTIL  HFA;VENTOLIN  HFA) 108 (90 Base) MCG/ACT inhaler [id number]  Inhale 2 puffs into the lungs every 4 (four) hours as needed for wheezing or shortness of breath. Alisa Irish, NP  Active Self  amLODipine   (NORVASC ) 10 MG tablet [id number] Yes Take 1 tablet (10 mg total) by mouth daily. Raina Bunting, DO Taking Active   aspirin  EC 81 MG tablet [id number] Yes Take 81 mg by mouth daily. [provider] Taking Active Self  atorvastatin  (LIPITOR ) 80 MG tablet [id number]  Take 1 tablet (80 mg total) by mouth at bedtime. Tapia, Leisa, PA-C  Active   gabapentin  (NEURONTIN ) 300 MG capsule [id number] Yes TAKE 1 CAPSULE BY MOUTH AT BEDTIME Raina Bunting, DO Taking Active     Discontinued 06/06/23 1011 (Change in therapy)   Insulin  Pen Needle 32G X 4 MM MISC [id number]  Use as directed with insulin  daily Tapia, Leisa, PA-C  Active   losartan  (COZAAR ) 50 MG tablet [id number] Yes Take 1 tablet (50 mg total) by mouth every evening.  Patient taking differently: Take 50 mg by mouth 2 (two) times daily.   Alisa Irish, NP Taking Active   metFORMIN  (GLUCOPHAGE -XR) 750 MG 24 hr tablet [id number] Yes TAKE 2 TABLETS BY MOUTH ONCE DAILY WITH BREAKFAST  Patient taking differently: Take 750 mg by mouth 2 (two) times daily.   Raina Bunting, DO Taking Active   metoprolol  succinate (TOPROL -XL) 25 MG 24 hr tablet [id number] No Take 1 tablet (25 mg total) by mouth every morning.  Patient not taking: Reported on 02/06/2024   Alisa Irish, NP Not Taking Active   nitroGLYCERIN  (NITROSTAT ) 0.4 MG SL tablet [id number]  Place 1 tablet (0.4 mg total) under the tongue every 5 (five) minutes as needed for chest pain. Max of 3 total; call 911 Raina Bunting, DO  Active   pantoprazole  (PROTONIX ) 40 MG tablet [id number]  Take 1 tablet (40 mg total) by mouth 2 (two) times daily. Karamalegos, Kayleen Party, DO  Active   Tiotropium Bromide -Olodaterol (STIOLTO RESPIMAT) 2.5-2.5 MCG/ACT AERS [id number]  Inhale 2 inhalations into the lungs once daily [provider]  Active   tirzepatide  (MOUNJARO ) 2.5 MG/0.5ML Pen [id number] Yes Inject 2.5 mg into the skin once a week. Raina Bunting, DO Taking Active               Assessment/Plan:   Renewal of atorvastatin  prescription sent to pharmacy for patient as requested  Diabetes: - Currently uncontrolled - Reviewed long term cardiovascular and renal outcomes of uncontrolled blood sugar - Reviewed goal A1c, goal fasting, and goal 2 hour post prandial glucose - Reviewed dietary modifications including importance of having regular well-balanced meals and snacks throughout the day, while controlling carbohydrate portion sizes - Recommend to check glucose, keep log of results and have this record to review at upcoming medical appointments. Patient to contact provider office sooner if needed for readings outside of established parameters or symptoms    Hypertension: - Reviewed long term cardiovascular and renal outcomes of uncontrolled blood pressure - Identify patient has not been not taking metoprolol  ER 25 mg daily. Will collaborate with PCP regarding HTN/CAD medication management for patient.  - Recommend to restart monitoring home blood pressure, keep log  of results and have this record to review at upcoming medical appointments. Patient to contact provider office sooner if needed for readings outside of established parameters or symptoms     Follow Up Plan: Clinical Pharmacist will follow up with patient by telephone on 05/21/2024 at 10:30 AM       Arthur Lash, PharmD, [facility] [phone]

## 2024-02-07 ENCOUNTER — Other Ambulatory Visit: Payer: Self-pay | Admitting: Family Medicine

## 2024-02-07 DIAGNOSIS — I2510 Atherosclerotic heart disease of native coronary artery without angina pectoris: Secondary | ICD-10-CM

## 2024-02-07 MED ORDER — METOPROLOL SUCCINATE ER 25 MG PO TB24
25.0000 mg | ORAL_TABLET | Freq: Every day | ORAL | 3 refills | Status: AC
Start: 2024-02-07 — End: ?

## 2024-02-07 MED ORDER — ATORVASTATIN CALCIUM 80 MG PO TABS: 80.0000 mg | ORAL_TABLET | Freq: Every day | ORAL | 0 refills | Status: DC

## 2024-02-07 NOTE — Patient Instructions (Signed)
 Goals Addressed             This Visit's Progress    Pharmacy Goals       The goal A1c is less than 7%. This is the best way to reduce the risk of the long term complications of diabetes, including heart disease, kidney disease, eye disease, strokes, and nerve damage. An A1c of less than 7% corresponds with fasting sugars less than 130 and 2 hour after meal sugars less than 180.    Check your blood pressure once daily, and any time you have concerning symptoms like headache, chest pain, dizziness, shortness of breath, or vision changes.   Our goal is less than 130/80.  To appropriately check your blood pressure, make sure you do the following:  1) Avoid caffeine, exercise, or tobacco products for 30 minutes before checking. Empty your bladder. 2) Sit with your back supported in a flat-backed chair. Rest your arm on something flat (arm of the chair, table, etc). 3) Sit still with your feet flat on the floor, resting, for at least 5 minutes.  4) Check your blood pressure. Take 1-2 readings.  5) Write down these readings and bring with you to any provider appointments.  Bring your home blood pressure machine with you to a provider's office for accuracy comparison at least once a year.   Make sure you take your blood pressure medications before you come to any office visit, even if you were asked to fast for labs.    Thank you!   Arthur Lash, PharmD, Becky Bowels, CPP Clinical Pharmacist [facility] [phone]

## 2024-02-08 ENCOUNTER — Other Ambulatory Visit (INDEPENDENT_AMBULATORY_CARE_PROVIDER_SITE_OTHER): Payer: Self-pay | Admitting: Pharmacist

## 2024-02-08 ENCOUNTER — Encounter: Payer: Self-pay | Admitting: Pharmacist

## 2024-02-08 DIAGNOSIS — I10 Essential (primary) hypertension: Secondary | ICD-10-CM

## 2024-02-08 NOTE — Patient Instructions (Signed)
 Please check your blood pressure once daily, and any time you have concerning symptoms like headache, chest pain, dizziness, shortness of breath, or vision changes.   Our goal is less than 130/80.  To appropriately check your blood pressure, make sure you do the following:  1) Avoid caffeine, exercise, or tobacco products for 30 minutes before checking. Empty your bladder. 2) Sit with your back supported in a flat-backed chair. Rest your arm on something flat (arm of the chair, table, etc). 3) Sit still with your feet flat on the floor, resting, for at least 5 minutes.  4) Check your blood pressure. Take 1-2 readings.  5) Write down these readings and bring with you to any provider appointments.  Bring your home blood pressure machine with you to a provider's office for accuracy comparison at least once a year.   Make sure you take your blood pressure medications before you come to any office visit, even if you were asked to fast for labs.  I look forward to speaking with you again by telephone on 02/27/2024 at 2:30 PM   Arthur Lash, PharmD, Becky Bowels, CPP Clinical Pharmacist [facility] [phone]

## 2024-02-08 NOTE — Telephone Encounter (Signed)
 Requested medication (s) are due for refill today:   Not sure  Requested medication (s) are on the active medication list:   Yes as discontinued on 12/16/2023 due to a change in therapy.  Future visit scheduled:   Yes 7/7 with Dr. Romeo Co    LOV 01/17/2024   Last ordered: Discontinued 12/16/2023.   Returned because there is a note that this is needed ASAP.   Do not see in notes where this was reordered.   Dr. Romeo Co to review.    Requested Prescriptions  Pending Prescriptions Disp Refills   glipiZIDE  (GLUCOTROL  XL) 5 MG 24 hr tablet [Pharmacy Med Name: GLIPIZIDE  ER 5 MG TAB] 30 tablet 0    Sig: TAKE 1 TABLET BY MOUTH ONCE DAILY WITH BREAKFAST *DOSE DECREASE     Endocrinology:  Diabetes - Sulfonylureas Passed - 02/08/2024  9:20 AM      Passed - HBA1C is between 0 and 7.9 and within 180 days    Hemoglobin A1C  Date Value Ref Range Status  12/16/2023 7.1 (A) 4.0 - 5.6 % Final   Hgb A1c MFr Bld  Date Value Ref Range Status  06/06/2019 6.5 (H) <5.7 % of total Hgb Final    Comment:    For someone without known diabetes, a hemoglobin A1c value of 6.5% or greater indicates that they may have  diabetes and this should be confirmed with a follow-up  test. . For someone with known diabetes, a value <7% indicates  that their diabetes is well controlled and a value  greater than or equal to 7% indicates suboptimal  control. A1c targets should be individualized based on  duration of diabetes, age, comorbid conditions, and  other considerations. . Currently, no consensus exists regarding use of hemoglobin A1c for diagnosis of diabetes for children. .          Passed - Cr in normal range and within 360 days    Creat  Date Value Ref Range Status  06/06/2019 0.75 0.70 - 1.33 mg/dL Final    Comment:    For patients >54 years of age, the reference limit for Creatinine is approximately 13% higher for people identified as African-American. .    Creatinine, Ser  Date Value Ref  Range Status  01/18/2024 0.81 0.61 - 1.24 mg/dL Final   Creatinine, Urine  Date Value Ref Range Status  11/29/2018 237 20 - 320 mg/dL Final         Passed - Valid encounter within last 6 months    Recent Outpatient Visits           3 weeks ago Cellulitis of left forearm   [place] [facility] [place], Kayleen Party, DO   1 month ago Type 2 diabetes mellitus with other specified complication, with long-term current use of insulin  [facility])   [facility] [place], Kayleen Party, [place]

## 2024-02-08 NOTE — Progress Notes (Signed)
   02/08/2024  Patient ID: Scott Rosario, male   DOB: 05/11/1961, 63 y.o.   MRN: [id number]  Collaborated with PCP regarding hypertension medication management for patient. When spoke with patient on 5/12, identified he was not taking metoprolol  ER 25 mg daily. Per dispensing history in chart, metoprolol  ER prescription last filled 03/15/2022 and patient denied taking. Note patient with CAD: h/o of MI and PCI.  Provider agrees with plan for patient to restart metoprolol  ER 25 mg daily. New prescription sent to pharmacy on 02/07/2024  Follow up with patient by telephone today to provide update. Patient verbalizes understanding plan to restart metoprolol  ER 25 mg daily, while continuing amlodipine  10 mg daily and losartan  50 mg twice daily.  Advise patient to monitor for dizziness/lightheadedness with restart of metoprolol  ER and to take positional changes slowly  Counsel on home blood pressure monitoring technique  - Recommend to restart monitoring home blood pressure, keep log of results and have this record to review at upcoming medical appointments. Patient to contact provider office sooner if needed for readings outside of established parameters or symptoms  Follow Up Plan: Clinical Pharmacist to follow up with patient by telephone on 02/27/2024 at 2:30 PM to review home blood pressure readings.  Arthur Lash, PharmD, Becky Bowels, CPP Clinical Pharmacist [facility] [phone]

## 2024-02-12 ENCOUNTER — Emergency Department

## 2024-02-12 ENCOUNTER — Encounter: Payer: Self-pay | Admitting: *Deleted

## 2024-02-12 ENCOUNTER — Other Ambulatory Visit: Payer: Self-pay

## 2024-02-12 ENCOUNTER — Emergency Department
Admission: EM | Admit: 2024-02-12 | Discharge: 2024-02-13 | Disposition: A | Discharge status: Home / Self Care | Attending: Emergency Medicine | Admitting: Emergency Medicine

## 2024-02-12 DIAGNOSIS — D72829 Elevated white blood cell count, unspecified: Secondary | ICD-10-CM | POA: Diagnosis not present

## 2024-02-12 DIAGNOSIS — R0789 Other chest pain: Secondary | ICD-10-CM | POA: Diagnosis not present

## 2024-02-12 DIAGNOSIS — I2510 Atherosclerotic heart disease of native coronary artery without angina pectoris: Secondary | ICD-10-CM | POA: Diagnosis not present

## 2024-02-12 DIAGNOSIS — R079 Chest pain, unspecified: Secondary | ICD-10-CM | POA: Insufficient documentation

## 2024-02-12 LAB — CBC
HCT: 38.5 % — ABNORMAL LOW (ref 39.0–52.0)
Hemoglobin: 12.9 g/dL — ABNORMAL LOW (ref 13.0–17.0)
MCH: 28.7 pg (ref 26.0–34.0)
MCHC: 33.5 g/dL (ref 30.0–36.0)
MCV: 85.7 fL (ref 80.0–100.0)
Platelets: 256 10*3/uL (ref 150–400)
RBC: 4.49 MIL/uL (ref 4.22–5.81)
RDW: 13 % (ref 11.5–15.5)
WBC: 11.6 10*3/uL — ABNORMAL HIGH (ref 4.0–10.5)
nRBC: 0 % (ref 0.0–0.2)

## 2024-02-12 LAB — BASIC METABOLIC PANEL WITH GFR
Anion gap: 9 (ref 5–15)
BUN: 20 mg/dL (ref 8–23)
CO2: 25 mmol/L (ref 22–32)
Calcium: 9.4 mg/dL (ref 8.9–10.3)
Chloride: 104 mmol/L (ref 98–111)
Creatinine, Ser: 0.91 mg/dL (ref 0.61–1.24)
GFR, Estimated: >60 mL/min (ref >60)
Glucose, Bld: 118 mg/dL — ABNORMAL HIGH (ref 70–99)
Potassium: 4.1 mmol/L (ref 3.5–5.1)
Sodium: 138 mmol/L (ref 135–145)

## 2024-02-12 LAB — TROPONIN I (HIGH SENSITIVITY): Troponin I (High Sensitivity): 6 ng/L (ref <18)

## 2024-02-12 MED ORDER — FENTANYL CITRATE PF 50 MCG/ML IJ SOSY: 50.0000 ug | PREFILLED_SYRINGE | Freq: Once | INTRAMUSCULAR | Status: DC

## 2024-02-12 NOTE — ED Provider Notes (Signed)
 [facility] Provider Note    Event Date/Time   First MD Initiated Contact with Patient 02/12/24 2147     (approximate)   History   Chest Pain   HPI  Scott Rosario is a 63 y.o. male with a history of cirrhosis, coronary disease and aortic aneurysm   This morning chest pain, rating towards his left arm pressure-like sensation in the chest associate with nausea.  No shortness of breath no leg swelling.  Reports similar symptoms sort of like a heart attack but not as intense or severe.  No abdominal pain no back pain.  Feels perfectly fine now.  Symptoms all went away after he got aspirin  and nitroglycerin  with EMS.  Completely symptom-free now.  Cardiologist used to be Dr. Bary Likes  Physical Exam   Triage Vital Signs: ED Triage Vitals  Encounter Vitals Group     BP 02/12/24 2003 119/71     Systolic BP Percentile --      Diastolic BP Percentile --      Pulse Rate 02/12/24 2003 73     Resp 02/12/24 2003 18     Temp 02/12/24 2003 99 F (37.2 C)     Temp Source 02/12/24 2003 Oral     SpO2 02/12/24 2003 95 %     Weight 02/12/24 1949 210 lb (95.3 kg)     Height 02/12/24 1949 6\' 1"  (1.854 m)     Head Circumference --      Peak Flow --      Pain Score 02/12/24 2000 5     Pain Loc --      Pain Education --      Exclude from Growth Chart --     Most recent vital signs: Vitals:   02/12/24 2003 02/12/24 2331  BP: 119/71 135/71  Pulse: 73 72  Resp: 18 18  Temp: 99 F (37.2 C)   SpO2: 95% 96%     General: Awake, no distress.  CV:  Good peripheral perfusion.  Normal tones and rate Resp:  Normal effort.  Clear bilateral Abd:  No distention.  Soft nontender Other:  No lower extremity edema venous cords or congestion   ED Results / Procedures / Treatments   Labs (all labs ordered are listed, but only abnormal results are displayed) Labs Reviewed  BASIC METABOLIC PANEL WITH GFR - Abnormal; Notable for the following components:      Result  Value   Glucose, Bld 118 (*)    All other components within normal limits  CBC - Abnormal; Notable for the following components:   WBC 11.6 (*)    Hemoglobin 12.9 (*)    HCT 38.5 (*)    All other components within normal limits  TROPONIN I (HIGH SENSITIVITY)  TROPONIN I (HIGH SENSITIVITY)     EKG  A interpreted by me at 2005 heart rate 75 QRS 90 QTc 420 Normal sinus rhythm no evidence of acute ischemia.  Incomplete right bundle   RADIOLOGY  DG Chest 2 View Result Date: 02/12/2024 CLINICAL DATA:  Chest pain EXAM: CHEST - 2 VIEW COMPARISON:  02/02/2019 FINDINGS: Heart and mediastinal contours are within normal limits. No focal opacities or effusions. No acute bony abnormality. IMPRESSION: No active cardiopulmonary disease. Electronically Signed   By: Janeece Mechanic M.D.   On: 02/12/2024 20:19        PROCEDURES:  Critical Care performed: No  Procedures   MEDICATIONS ORDERED IN ED: Medications - No data to display  IMPRESSION / MDM / ASSESSMENT AND PLAN / ED COURSE  I reviewed the triage vital signs and the nursing notes.                              Differential diagnosis includes, but is not limited to, ACS, aortic dissection, pulmonary embolism, cardiac tamponade, pneumothorax, pneumonia, pericarditis, myocarditis, GI-related causes including esophagitis/gastritis, and musculoskeletal chest wall pain.    No hypoxia no tachycardia no signs or symptoms would be highly suggestive of thromboembolism or dissection.  Reassuring that symptoms have all abated, concern for unstable angina quite high.  Thus far blood testing shows no elevation of cardiac biomarkers.    Patient's presentation is most consistent with acute complicated illness / injury requiring diagnostic workup.      Clinical Course as of 02/12/24 2353  [name] Feb 12, 2024  2312 Discussed the patient's presentation with Dr. Braxton Calico.  I encouraged an recommended to the patient to be stay in the hospital as  I am concerned that his chest pain could spell that he is at high risk for having a "heart attack" [MQ]  2312 The patient, with his wife present, advises that he is unwilling to stay for hospitalization at this time.  I did strongly recommended that he be admitted to the hospital for further evaluation evaluation with cardiologist" stress test" or similar.  The patient declines this.  He is willing to stay for repeat troponin.  If this is negative, and he remains pain-free which she is now, he wishes to be discharged. [MQ]  2313 I had our cardiology team will be calling him later this morning to set him up for an appointment on May 19. [MQ]  2313 I discussed very careful return precautions with the patient who is in agreement.  Currently awaiting second troponin [MQ]    Clinical Course User Index [MQ] Iver Marker, MD   Ongoing care assigned to Dr. Vicenta Graft.  Plan for follow-up on second troponin.  If patient remains pain free and troponin normal anticipate discharge to follow-up with cardiology clinic today.  Return precautions and treatment recommendations and follow-up discussed with the patient who is agreeable with the plan.   FINAL CLINICAL IMPRESSION(S) / ED DIAGNOSES   Final diagnoses:  Chest pain, moderate coronary artery risk     Rx / DC Orders   ED Discharge Orders          Ordered    Ambulatory referral to Cardiology        02/12/24 2312             Note:  This document was prepared using Dragon voice recognition software and may include unintentional dictation errors.   Iver Marker, MD 02/12/24 [phone]

## 2024-02-12 NOTE — ED Triage Notes (Signed)
 Pt brought in via ems from home with chest pain.  Pt denies Sob.  Cig smoker.  Pain began yesterday, worse today.  Pt has nausea and dizziness.  Pt alert  speech clear.  Iv in place.  Pt was given ntg spray by ems and asa.

## 2024-02-12 NOTE — ED Triage Notes (Addendum)
 Scott Rosario

## 2024-02-12 NOTE — ED Notes (Signed)
 BIB [facility]. Walked up for CP. 7/10 center of chest radiation to right arm with tingling sensation and nausea  325 ASA 1 SL NTG 20G  127 BGL  152/80 120/64 post NTG  HX of NSTEMI and 3 stents.

## 2024-02-12 NOTE — Discharge Instructions (Signed)
 You have been seen in the Emergency Department (ED) today for chest pain.  As we have discussed today's evaluation is concerning that you could have a developing heart blockage, and my recommendation is for you to stay in the hospital overnight.  However, you must follow-up today with cardiology at the clinic.  Please call the cardiology clinic to set up a clinic visit for today.  Please follow up with the recommended doctor as instructed above in these documents regarding today's emergent visit and your recent symptoms to discuss further management.  Continue to take your regular medications. If you are not doing so already, please also take a daily baby aspirin  (81 mg), at least until you follow up with your doctor.  Return to the Emergency Department (ED) if you experience any further chest pain/pressure/tightness, difficulty breathing, or sudden sweating, or other symptoms that concern you.

## 2024-02-13 DIAGNOSIS — Z955 Presence of coronary angioplasty implant and graft: Secondary | ICD-10-CM | POA: Diagnosis not present

## 2024-02-13 DIAGNOSIS — I10 Essential (primary) hypertension: Secondary | ICD-10-CM | POA: Diagnosis not present

## 2024-02-13 DIAGNOSIS — I214 Non-ST elevation (NSTEMI) myocardial infarction: Secondary | ICD-10-CM | POA: Diagnosis not present

## 2024-02-13 DIAGNOSIS — I252 Old myocardial infarction: Secondary | ICD-10-CM | POA: Diagnosis not present

## 2024-02-13 DIAGNOSIS — I2510 Atherosclerotic heart disease of native coronary artery without angina pectoris: Secondary | ICD-10-CM | POA: Diagnosis not present

## 2024-02-13 DIAGNOSIS — Z720 Tobacco use: Secondary | ICD-10-CM | POA: Diagnosis not present

## 2024-02-13 LAB — TROPONIN I (HIGH SENSITIVITY): Troponin I (High Sensitivity): 5 ng/L (ref <18)

## 2024-02-22 ENCOUNTER — Encounter
Admission: RE | Disposition: A | Discharge status: Home / Self Care | Payer: Self-pay | Source: Home / Self Care | Attending: Internal Medicine

## 2024-02-22 ENCOUNTER — Other Ambulatory Visit: Payer: Self-pay

## 2024-02-22 ENCOUNTER — Ambulatory Visit
Admission: RE | Admit: 2024-02-22 | Discharge: 2024-02-22 | Disposition: A | Discharge status: Home / Self Care | Attending: Internal Medicine | Admitting: Internal Medicine

## 2024-02-22 ENCOUNTER — Encounter: Payer: Self-pay | Admitting: Internal Medicine

## 2024-02-22 DIAGNOSIS — Z955 Presence of coronary angioplasty implant and graft: Secondary | ICD-10-CM | POA: Insufficient documentation

## 2024-02-22 DIAGNOSIS — I200 Unstable angina: Secondary | ICD-10-CM

## 2024-02-22 DIAGNOSIS — Z7982 Long term (current) use of aspirin: Secondary | ICD-10-CM | POA: Insufficient documentation

## 2024-02-22 DIAGNOSIS — I25110 Atherosclerotic heart disease of native coronary artery with unstable angina pectoris: Secondary | ICD-10-CM | POA: Diagnosis not present

## 2024-02-22 HISTORY — PX: LEFT HEART CATH AND CORONARY ANGIOGRAPHY: CATH118249

## 2024-02-22 LAB — GLUCOSE, CAPILLARY: Glucose-Capillary: 158 mg/dL — ABNORMAL HIGH (ref 70–99)

## 2024-02-22 SURGERY — LEFT HEART CATH AND CORONARY ANGIOGRAPHY
Anesthesia: Moderate Sedation | Laterality: Left

## 2024-02-22 MED ORDER — HEPARIN (PORCINE) IN NACL 1000-0.9 UT/500ML-% IV SOLN
INTRAVENOUS | Status: AC
  Filled 2024-02-22: qty 1000

## 2024-02-22 MED ORDER — SODIUM CHLORIDE 0.9% FLUSH
3.0000 mL | INTRAVENOUS | Status: DC | PRN
Start: 2024-02-22 — End: 2024-02-22

## 2024-02-22 MED ORDER — SODIUM CHLORIDE 0.9% FLUSH: 3.0000 mL | Freq: Two times a day (BID) | INTRAVENOUS | Status: DC

## 2024-02-22 MED ORDER — SODIUM CHLORIDE 0.9% FLUSH: 3.0000 mL | INTRAVENOUS | Status: DC | PRN

## 2024-02-22 MED ORDER — SODIUM CHLORIDE 0.9 % WEIGHT BASED INFUSION
1.0000 mL/kg/h | INTRAVENOUS | Status: DC
  Administered 2024-02-22: 1 mL/kg/h via INTRAVENOUS

## 2024-02-22 MED ORDER — IOHEXOL 300 MG/ML  SOLN
INTRAMUSCULAR | Status: DC | PRN
  Administered 2024-02-22: 68 mL

## 2024-02-22 MED ORDER — FENTANYL CITRATE (PF) 100 MCG/2ML IJ SOLN
INTRAMUSCULAR | Status: DC | PRN
  Administered 2024-02-22: 25 ug via INTRAVENOUS

## 2024-02-22 MED ORDER — VERAPAMIL HCL 2.5 MG/ML IV SOLN
INTRAVENOUS | Status: DC | PRN
  Administered 2024-02-22: 2.5 mg via INTRA_ARTERIAL

## 2024-02-22 MED ORDER — SODIUM CHLORIDE 0.9% FLUSH
3.0000 mL | Freq: Two times a day (BID) | INTRAVENOUS | Status: DC
Start: 2024-02-23 — End: 2024-02-22

## 2024-02-22 MED ORDER — HYDRALAZINE HCL 20 MG/ML IJ SOLN: 10.0000 mg | INTRAMUSCULAR | Status: DC | PRN

## 2024-02-22 MED ORDER — MIDAZOLAM HCL 2 MG/2ML IJ SOLN
INTRAMUSCULAR | Status: AC
  Filled 2024-02-22: qty 2

## 2024-02-22 MED ORDER — HEPARIN SODIUM (PORCINE) 1000 UNIT/ML IJ SOLN
INTRAMUSCULAR | Status: DC | PRN
  Administered 2024-02-22: 3000 [IU] via INTRAVENOUS

## 2024-02-22 MED ORDER — SODIUM CHLORIDE 0.9 % WEIGHT BASED INFUSION: 1.0000 mL/kg/h | INTRAVENOUS | Status: DC

## 2024-02-22 MED ORDER — MIDAZOLAM HCL 2 MG/2ML IJ SOLN
INTRAMUSCULAR | Status: DC | PRN
  Administered 2024-02-22: 1 mg via INTRAVENOUS

## 2024-02-22 MED ORDER — LIDOCAINE HCL (PF) 1 % IJ SOLN
INTRAMUSCULAR | Status: DC | PRN
  Administered 2024-02-22: 2 mL

## 2024-02-22 MED ORDER — SODIUM CHLORIDE 0.9 % WEIGHT BASED INFUSION
3.0000 mL/kg/h | INTRAVENOUS | Status: AC
  Administered 2024-02-22: 3 mL/kg/h via INTRAVENOUS

## 2024-02-22 MED ORDER — VERAPAMIL HCL 2.5 MG/ML IV SOLN
INTRAVENOUS | Status: AC
  Filled 2024-02-22: qty 2

## 2024-02-22 MED ORDER — SODIUM CHLORIDE 0.9 % IV SOLN
250.0000 mL | INTRAVENOUS | Status: DC | PRN
Start: 2024-02-23 — End: 2024-02-22

## 2024-02-22 MED ORDER — ASPIRIN 81 MG PO CHEW
81.0000 mg | CHEWABLE_TABLET | ORAL | Status: AC
  Administered 2024-02-22: 81 mg via ORAL

## 2024-02-22 MED ORDER — ASPIRIN 81 MG PO CHEW
CHEWABLE_TABLET | ORAL | Status: AC
  Filled 2024-02-22: qty 1

## 2024-02-22 MED ORDER — HEPARIN SODIUM (PORCINE) 1000 UNIT/ML IJ SOLN
INTRAMUSCULAR | Status: AC
  Filled 2024-02-22: qty 10

## 2024-02-22 MED ORDER — ACETAMINOPHEN 325 MG PO TABS: 650.0000 mg | ORAL_TABLET | ORAL | Status: DC | PRN

## 2024-02-22 MED ORDER — FENTANYL CITRATE (PF) 100 MCG/2ML IJ SOLN
INTRAMUSCULAR | Status: AC
Start: 2024-02-22 — End: ?
  Filled 2024-02-22: qty 2

## 2024-02-22 MED ORDER — ONDANSETRON HCL 4 MG/2ML IJ SOLN: 4.0000 mg | Freq: Four times a day (QID) | INTRAMUSCULAR | Status: DC | PRN

## 2024-02-22 MED ORDER — HEPARIN (PORCINE) IN NACL 1000-0.9 UT/500ML-% IV SOLN
INTRAVENOUS | Status: DC | PRN
  Administered 2024-02-22: 1000 mL

## 2024-02-22 MED ORDER — LIDOCAINE HCL 1 % IJ SOLN
INTRAMUSCULAR | Status: AC
  Filled 2024-02-22: qty 20

## 2024-02-22 MED ORDER — SODIUM CHLORIDE 0.9 % IV SOLN: 250.0000 mL | INTRAVENOUS | Status: DC | PRN

## 2024-02-22 SURGICAL SUPPLY — 8 items
CATH 5FR JL3.5 JR4 ANG PIG MP (CATHETERS) IMPLANT
DEVICE RAD TR BAND REGULAR (VASCULAR PRODUCTS) IMPLANT
DRAPE BRACHIAL (DRAPES) IMPLANT
GLIDESHEATH SLEND SS 6F .021 (SHEATH) IMPLANT
GUIDEWIRE INQWIRE 1.5J.035X260 (WIRE) IMPLANT
PACK CARDIAC CATH (CUSTOM PROCEDURE TRAY) ×1 IMPLANT
SET ATX-X65L (MISCELLANEOUS) IMPLANT
STATION PROTECTION PRESSURIZED (MISCELLANEOUS) IMPLANT

## 2024-02-22 NOTE — Discharge Instructions (Signed)
 Radial Site Care Refer to this sheet in the next few weeks. These instructions provide you with information about caring for yourself after your procedure. Your health care provider may also give you more specific instructions. Your treatment has been planned according to current medical practices, but problems sometimes occur. Call your health care provider if you have any problems or questions after your procedure. What can I expect after the procedure? After your procedure, it is typical to have the following: Bruising at the radial site that usually fades within 1-2 weeks. Blood collecting in the tissue (hematoma) that may be painful to the touch. It should usually decrease in size and tenderness within 1-2 weeks.  Follow these instructions at home: Take medicines only as directed by your health care provider. If you are on a medication called Metformin please do not take for 48 hours after your procedure. Over the next 48hrs please increase your fluid intake of water and non caffeine beverages to flush the contrast dye out of your system.  You may shower 24 hours after the procedure  Leave your bandage on and gently wash the site with plain soap and water. Pat the area dry with a clean towel. Do not rub the site, because this may cause bleeding.  Remove your dressing 48hrs after your procedure and leave open to air.  Do not submerge your site in water for 7 days. This includes swimming and washing dishes.  Check your insertion site every day for redness, swelling, or drainage. Do not apply powder or lotion to the site. Do not flex or bend the affected arm for 24 hours or as directed by your health care provider. Do not push or pull heavy objects with the affected arm for 24 hours or as directed by your health care provider. Do not lift over 10 lb (4.5 kg) for 5 days after your procedure or as directed by your health care provider. Ask your health care provider when it is okay to: Return to  work or school. Resume usual physical activities or sports. Resume sexual activity. Do not drive home if you are discharged the same day as the procedure. Have someone else drive you. You may drive 48 hours after the procedure Do not operate machinery or power tools for 24 hours after the procedure. If your procedure was done as an outpatient procedure, which means that you went home the same day as your procedure, a responsible adult should be with you for the first 24 hours after you arrive home. Keep all follow-up visits as directed by your health care provider. This is important. Contact a health care provider if: You have a fever. You have chills. You have increased bleeding from the radial site. Hold pressure on the site. Get help right away if: You have unusual pain at the radial site. You have redness, warmth, or swelling at the radial site. You have drainage (other than a small amount of blood on the dressing) from the radial site. The radial site is bleeding, and the bleeding does not stop after 15 minutes of holding steady pressure on the site. Your arm or hand becomes pale, cool, tingly, or numb. This information is not intended to replace advice given to you by your health care provider. Make sure you discuss any questions you have with your health care provider. Document Released: 10/16/2010 Document Revised: 02/19/2016 Document Reviewed: 04/01/2014 Elsevier Interactive Patient Education  2018 [employer].

## 2024-02-27 ENCOUNTER — Encounter: Payer: Self-pay | Admitting: Internal Medicine

## 2024-02-27 ENCOUNTER — Other Ambulatory Visit

## 2024-02-27 ENCOUNTER — Telehealth: Payer: Self-pay | Admitting: Pharmacist

## 2024-02-27 LAB — CARDIAC CATHETERIZATION: Cath EF Quantitative: 50 %

## 2024-02-27 NOTE — Progress Notes (Signed)
   Outreach Note  02/27/2024 Name: YAZAN GATLING MRN: [id number] DOB: 05-13-1961  Referred by: Raina Bunting, DO  Was unable to reach patient via telephone today and have left HIPAA compliant voicemail asking patient to return my call.   Follow Up Plan: Will collaborate with Care Guide to outreach to schedule follow up with me  Arthur Lash, PharmD, Becky Bowels, CPP Clinical Pharmacist [facility] [phone]

## 2024-03-06 DIAGNOSIS — I2510 Atherosclerotic heart disease of native coronary artery without angina pectoris: Secondary | ICD-10-CM | POA: Diagnosis not present

## 2024-03-06 DIAGNOSIS — I2583 Coronary atherosclerosis due to lipid rich plaque: Secondary | ICD-10-CM | POA: Diagnosis not present

## 2024-03-12 ENCOUNTER — Other Ambulatory Visit: Payer: Self-pay

## 2024-03-12 DIAGNOSIS — I10 Essential (primary) hypertension: Secondary | ICD-10-CM | POA: Diagnosis not present

## 2024-03-12 DIAGNOSIS — Z955 Presence of coronary angioplasty implant and graft: Secondary | ICD-10-CM | POA: Diagnosis not present

## 2024-03-12 DIAGNOSIS — I252 Old myocardial infarction: Secondary | ICD-10-CM | POA: Diagnosis not present

## 2024-03-12 DIAGNOSIS — I2510 Atherosclerotic heart disease of native coronary artery without angina pectoris: Secondary | ICD-10-CM | POA: Diagnosis not present

## 2024-03-14 ENCOUNTER — Other Ambulatory Visit (HOSPITAL_COMMUNITY): Payer: Self-pay

## 2024-03-14 ENCOUNTER — Telehealth: Payer: Self-pay

## 2024-03-14 NOTE — Telephone Encounter (Signed)
 Pharmacy Patient Advocate Encounter   Received notification from CoverMyMeds that prior authorization for Mounjaro  2.5MG /0.5ML auto-injectors is required/requested.   Insurance verification completed.   The patient is insured through [facility] .   Per test claim: PA required; PA submitted to above mentioned insurance via CoverMyMeds Key/confirmation #/EOC [id number] Status is pending

## 2024-03-14 NOTE — Telephone Encounter (Signed)
 Pharmacy Patient Advocate Encounter  Received notification from [facility] that Prior Authorization for Mounjaro  2.5MG /0.5ML auto-injectors has been APPROVED from 03/14/2024 to 03/14/2025  Please see below note, as pt is expected to increase dose monthly

## 2024-03-22 ENCOUNTER — Other Ambulatory Visit

## 2024-03-22 DIAGNOSIS — E786 Lipoprotein deficiency: Secondary | ICD-10-CM

## 2024-03-22 DIAGNOSIS — E1169 Type 2 diabetes mellitus with other specified complication: Secondary | ICD-10-CM | POA: Diagnosis not present

## 2024-03-22 DIAGNOSIS — Z0000 Encounter for general adult medical examination without abnormal findings: Secondary | ICD-10-CM

## 2024-03-22 DIAGNOSIS — R351 Nocturia: Secondary | ICD-10-CM | POA: Diagnosis not present

## 2024-03-22 DIAGNOSIS — I2510 Atherosclerotic heart disease of native coronary artery without angina pectoris: Secondary | ICD-10-CM

## 2024-03-22 DIAGNOSIS — I10 Essential (primary) hypertension: Secondary | ICD-10-CM

## 2024-03-22 DIAGNOSIS — Z125 Encounter for screening for malignant neoplasm of prostate: Secondary | ICD-10-CM

## 2024-03-22 DIAGNOSIS — J432 Centrilobular emphysema: Secondary | ICD-10-CM

## 2024-03-22 DIAGNOSIS — Z794 Long term (current) use of insulin: Secondary | ICD-10-CM | POA: Diagnosis not present

## 2024-03-23 LAB — CBC WITH DIFFERENTIAL/PLATELET
Absolute Lymphocytes: 1637 {cells}/uL (ref 850–3900)
Absolute Monocytes: 877 {cells}/uL (ref 200–950)
Basophils Absolute: 128 {cells}/uL (ref 0–200)
Basophils Relative: 1.2 %
Eosinophils Absolute: 246 {cells}/uL (ref 15–500)
Eosinophils Relative: 2.3 %
HCT: 44.7 % (ref 38.5–50.0)
Hemoglobin: 14.3 g/dL (ref 13.2–17.1)
MCH: 28.7 pg (ref 27.0–33.0)
MCHC: 32 g/dL (ref 32.0–36.0)
MCV: 89.6 fL (ref 80.0–100.0)
MPV: 10.3 fL (ref 7.5–12.5)
Monocytes Relative: 8.2 %
Neutro Abs: 7811 {cells}/uL — ABNORMAL HIGH (ref 1500–7800)
Neutrophils Relative %: 73 %
Platelets: 242 10*3/uL (ref 140–400)
RBC: 4.99 10*6/uL (ref 4.20–5.80)
RDW: 13.1 % (ref 11.0–15.0)
Total Lymphocyte: 15.3 %
WBC: 10.7 10*3/uL (ref 3.8–10.8)

## 2024-03-23 LAB — HEMOGLOBIN A1C
Hgb A1c MFr Bld: 8.5 % — ABNORMAL HIGH (ref <5.7)
Mean Plasma Glucose: 197 mg/dL
eAG (mmol/L): 10.9 mmol/L

## 2024-03-23 LAB — COMPLETE METABOLIC PANEL WITHOUT GFR
AG Ratio: 1.9 (calc) (ref 1.0–2.5)
ALT: 18 U/L (ref 9–46)
AST: 13 U/L (ref 10–35)
Albumin: 4.1 g/dL (ref 3.6–5.1)
Alkaline phosphatase (APISO): 155 U/L — ABNORMAL HIGH (ref 35–144)
BUN: 15 mg/dL (ref 7–25)
CO2: 27 mmol/L (ref 20–32)
Calcium: 9.1 mg/dL (ref 8.6–10.3)
Chloride: 102 mmol/L (ref 98–110)
Creat: 0.77 mg/dL (ref 0.70–1.35)
Globulin: 2.2 g/dL (ref 1.9–3.7)
Glucose, Bld: 257 mg/dL — ABNORMAL HIGH (ref 65–99)
Potassium: 4.5 mmol/L (ref 3.5–5.3)
Sodium: 136 mmol/L (ref 135–146)
Total Bilirubin: 1.2 mg/dL (ref 0.2–1.2)
Total Protein: 6.3 g/dL (ref 6.1–8.1)

## 2024-03-23 LAB — LIPID PANEL
Cholesterol: 95 mg/dL (ref <200)
HDL: 33 mg/dL — ABNORMAL LOW (ref >=40)
LDL Cholesterol (Calc): 45 mg/dL
Non-HDL Cholesterol (Calc): 62 mg/dL (ref <130)
Total CHOL/HDL Ratio: 2.9 (calc) (ref <5.0)
Triglycerides: 89 mg/dL (ref <150)

## 2024-03-23 LAB — MICROALBUMIN / CREATININE URINE RATIO
Creatinine, Urine: 116 mg/dL (ref 20–320)
Microalb Creat Ratio: 238 mg/g{creat} — ABNORMAL HIGH (ref <30)
Microalb, Ur: 27.6 mg/dL

## 2024-03-23 LAB — PSA: PSA: 0.39 ng/mL (ref <=4.00)

## 2024-03-23 LAB — TSH: TSH: 2.06 m[IU]/L (ref 0.40–4.50)

## 2024-04-02 ENCOUNTER — Other Ambulatory Visit (INDEPENDENT_AMBULATORY_CARE_PROVIDER_SITE_OTHER): Payer: Self-pay | Admitting: Pharmacist

## 2024-04-02 ENCOUNTER — Ambulatory Visit (INDEPENDENT_AMBULATORY_CARE_PROVIDER_SITE_OTHER): Admitting: Family Medicine

## 2024-04-02 ENCOUNTER — Encounter: Payer: Self-pay | Admitting: Family Medicine

## 2024-04-02 ENCOUNTER — Other Ambulatory Visit: Payer: Self-pay

## 2024-04-02 VITALS — BP 122/70 | HR 79 | Ht 68.0 in | Wt 173.2 lb

## 2024-04-02 DIAGNOSIS — I2583 Coronary atherosclerosis due to lipid rich plaque: Secondary | ICD-10-CM

## 2024-04-02 DIAGNOSIS — I700 Atherosclerosis of aorta: Secondary | ICD-10-CM

## 2024-04-02 DIAGNOSIS — E1169 Type 2 diabetes mellitus with other specified complication: Secondary | ICD-10-CM

## 2024-04-02 DIAGNOSIS — I10 Essential (primary) hypertension: Secondary | ICD-10-CM | POA: Diagnosis not present

## 2024-04-02 DIAGNOSIS — M79645 Pain in left finger(s): Secondary | ICD-10-CM

## 2024-04-02 DIAGNOSIS — M7551 Bursitis of right shoulder: Secondary | ICD-10-CM

## 2024-04-02 DIAGNOSIS — I2510 Atherosclerotic heart disease of native coronary artery without angina pectoris: Secondary | ICD-10-CM

## 2024-04-02 DIAGNOSIS — E782 Mixed hyperlipidemia: Secondary | ICD-10-CM

## 2024-04-02 DIAGNOSIS — Z0000 Encounter for general adult medical examination without abnormal findings: Secondary | ICD-10-CM

## 2024-04-02 DIAGNOSIS — I714 Abdominal aortic aneurysm, without rupture, unspecified: Secondary | ICD-10-CM

## 2024-04-02 DIAGNOSIS — Z794 Long term (current) use of insulin: Secondary | ICD-10-CM

## 2024-04-02 DIAGNOSIS — Z7985 Long-term (current) use of injectable non-insulin antidiabetic drugs: Secondary | ICD-10-CM

## 2024-04-02 DIAGNOSIS — G8929 Other chronic pain: Secondary | ICD-10-CM

## 2024-04-02 DIAGNOSIS — Z1211 Encounter for screening for malignant neoplasm of colon: Secondary | ICD-10-CM

## 2024-04-02 MED ORDER — ATORVASTATIN CALCIUM 80 MG PO TABS: 80.0000 mg | ORAL_TABLET | Freq: Every day | ORAL | 3 refills | Status: AC

## 2024-04-02 MED ORDER — GABAPENTIN 300 MG PO CAPS: 300.0000 mg | ORAL_CAPSULE | Freq: Every day | ORAL | 3 refills | Status: AC

## 2024-04-02 MED ORDER — AMLODIPINE BESYLATE 10 MG PO TABS
10.0000 mg | ORAL_TABLET | Freq: Every day | ORAL | 3 refills | Status: DC
Start: 2024-04-02 — End: 2024-06-11

## 2024-04-02 MED ORDER — METFORMIN HCL ER 750 MG PO TB24: 750.0000 mg | ORAL_TABLET | Freq: Two times a day (BID) | ORAL | 3 refills | Status: DC

## 2024-04-02 MED ORDER — MOUNJARO 5 MG/0.5ML ~~LOC~~ SOAJ
5.0000 mg | SUBCUTANEOUS | 0 refills | Status: DC
  Filled 2024-04-02: qty 2, 28d supply, fill #0

## 2024-04-02 MED ORDER — LOSARTAN POTASSIUM 50 MG PO TABS: 50.0000 mg | ORAL_TABLET | Freq: Every evening | ORAL | 3 refills | Status: AC

## 2024-04-02 NOTE — Progress Notes (Unsigned)
 Subjective:    Patient ID: Scott Rosario, male    DOB: 06-May-1961, 63 y.o.   MRN: [id number]  Scott Rosario is a 63 y.o. male presenting on 04/02/2024 for No chief complaint on file.   HPI  Discussed the use of AI scribe software for clinical note transcription with the patient, who gave verbal consent to proceed.  History of Present Illness     ***other issues ***referral?  ***  Type 2 Diabetes ASCVD/PAD compcation MI Followed by Clinical Pharmacist He has experienced a significant improvement in blood sugar levels, with his A1c decreasing from 9.0% to 7.1% over the past three months. This improvement is attributed to regular monitoring, pricking his finger three times a day, and adherence to his medication regimen, which includes Trulicity  and a reduced dose of glipizide  from 10 mg to 5 mg. He notes occasional low blood sugar episodes, with the lowest being 73 mg/dL, causing symptoms of feeling drained and drowsy, but without the nervousness and shakiness he previously experienced. Meds Trulicity  0.75mg  weekly inj Metformin  XR 750mg  x 2 = 1500mg  daily Reports  good compliance. Tolerating well w/o side-effects Currently on ARB Admits rare hypoglycemia lowest 64, some symptoms 70-80 range. Denies polyuria, visual changes, numbness or tingling.   Leg Cramps He experiences significant leg cramps, primarily in his left leg, occurring at night and affecting his ankle, foot, and calf. These cramps have been severe enough to cause soreness and have been exacerbated by recent physical activity and falls. He has been using gabapentin  300 mg, but taking two pills at night makes him too drowsy in the morning. He has not used baclofen  recently. He mentions falling twice in the last 90 days, with one fall resulting in a significant bruise, though it has improved.   GERD He manages gastroesophageal reflux disease with pantoprazole , which he takes twice daily. Certain foods like pizza and  spaghetti exacerbate his symptoms, occasionally requiring an additional dose.  On Pantoprazole  40 TWICE A DAY    Centrilobular Emphysema Followed by Pulmonology Dr Theotis Last LDCT 04/2023, resolved LLL nodule.   Health Maintenance:  Colonoscopy done 04/2021 repeat 3 years, 2025. Due  PSA 0.39 negative     04/02/2024    2:31 PM 01/17/2024    2:31 PM 09/30/2023   10:09 AM  Depression screen PHQ 2/9  Decreased Interest 0 0 0  Down, Depressed, Hopeless 0 0 0  PHQ - 2 Score 0 0 0  Altered sleeping 0 0 3  Tired, decreased energy 3 0 3  Change in appetite 0 0 0  Feeling bad or failure about yourself  0 0 0  Trouble concentrating 0 0 0  Moving slowly or fidgety/restless 0 0 1  Suicidal thoughts 0 0 0  PHQ-9 Score 3 0 7  Difficult doing work/chores Not difficult at all Not difficult at all        04/02/2024    2:31 PM 01/17/2024    2:31 PM 09/30/2023   10:09 AM 09/15/2023    3:09 PM  GAD 7 : Generalized Anxiety Score  Nervous, Anxious, on Edge 0 0 0 2  Control/stop worrying 0 0 0 2  Worry too much - different things 0 0 0 2  Trouble relaxing 0 0 0 2  Restless 0 0 0 2  Easily annoyed or irritable 0 0 0 2  Afraid - awful might happen 0 0 0 0  Total GAD 7 Score 0 0 0 12  Anxiety Difficulty  Not difficult at all Not difficult at all       Past Medical History:  Diagnosis Date   Acute calculous cholecystitis    Alkaline phosphatase elevation    Aortic aneurysm (HCC) 12/10/2018   3.4 cm March 2020   Aortic atherosclerosis (HCC)    on CT 02/25/14   Asthma    CAD (coronary artery disease)    Cirrhosis (HCC)    Cirrhosis (HCC)    COPD (chronic obstructive pulmonary disease) (HCC)    Diabetes mellitus without complication (HCC)    Emphysema of lung (HCC)    Gallstone 12/10/2018   Gastritis without bleeding    GERD (gastroesophageal reflux disease)    Heart attack (HCC)    2005, 2017 (stent after each)   History of hiatal hernia    small   History of kidney stones    h/o    Hyperlipidemia    Hypertension    Ingrown toenail of both feet 11/29/2018   Pneumonia 2018   Pulmonary nodules    x 3, (5mm, 5mm, 4mm) chest CT February 25, 2014   Renal cyst    on CT 02/25/14   Tobacco use    Past Surgical History:  Procedure Laterality Date   APPENDECTOMY     CARDIAC CATHETERIZATION Left 03/09/2016   Procedure: Left Heart Cath and Coronary Angiography;  Surgeon: Wolm JINNY Rhyme, MD;  Location: [facility];  Service: Cardiovascular;  Laterality: Left;   CARDIAC CATHETERIZATION N/A 03/09/2016   Procedure: Coronary Stent Intervention;  Surgeon: Marsa Dooms, MD;  Location: [facility];  Service: Cardiovascular;  Laterality: N/A;   CHOLECYSTECTOMY N/A 01/31/2019   Procedure: LAPAROSCOPIC CHOLECYSTECTOMY - DIABETIC;  Surgeon: Nicholaus Selinda Birmingham, MD;  Location: [facility] ORS;  Service: General;  Laterality: N/A;   COLONOSCOPY WITH PROPOFOL  N/A 03/22/2019   Procedure: COLONOSCOPY WITH PROPOFOL ;  Surgeon: Jinny Carmine, MD;  Location: [facility];  Service: Endoscopy;  Laterality: N/A;  Diabetic - insulin  and oral meds   COLONOSCOPY WITH PROPOFOL  N/A 11/04/2020   Procedure: COLONOSCOPY WITH PROPOFOL ;  Surgeon: Janalyn Keene NOVAK, MD;  Location: [facility];  Service: Endoscopy;  Laterality: N/A;  COVID + 10-15-20   CORONARY ANGIOPLASTY WITH STENT PLACEMENT  10/25/2003   x 2   ESOPHAGOGASTRODUODENOSCOPY (EGD) WITH PROPOFOL  N/A 10/24/2017   Procedure: ESOPHAGOGASTRODUODENOSCOPY (EGD) WITH PROPOFOL ;  Surgeon: Jinny Carmine, MD;  Location: [facility];  Service: Endoscopy;  Laterality: N/A;  Diabetic - oral meds   ESOPHAGOGASTRODUODENOSCOPY (EGD) WITH PROPOFOL  N/A 05/01/2020   Procedure: ESOPHAGOGASTRODUODENOSCOPY (EGD) WITH PROPOFOL ;  Surgeon: Janalyn Keene NOVAK, MD;  Location: [facility];  Service: Endoscopy;  Laterality: N/A;   ESOPHAGOGASTRODUODENOSCOPY (EGD) WITH PROPOFOL  N/A 11/04/2020   Procedure: ESOPHAGOGASTRODUODENOSCOPY (EGD) WITH PROPOFOL   with biopsy;  Surgeon: Janalyn Keene NOVAK, MD;  Location: [facility];  Service: Endoscopy;  Laterality: N/A;   LEFT HEART CATH AND CORONARY ANGIOGRAPHY Left 02/22/2024   Procedure: LEFT HEART CATH AND CORONARY ANGIOGRAPHY;  Surgeon: Florencio Cara BIRCH, MD;  Location: [facility];  Service: Cardiovascular;  Laterality: Left;   POLYPECTOMY  03/22/2019   Procedure: POLYPECTOMY;  Surgeon: Jinny Carmine, MD;  Location: [facility];  Service: Endoscopy;;   POLYPECTOMY  11/04/2020   Procedure: POLYPECTOMY;  Surgeon: Janalyn Keene NOVAK, MD;  Location: [facility];  Service: Endoscopy;;   Social History   Socioeconomic History   Marital status: Married    Spouse name: Not on file   Number of children:  Not on file   Years of education: Not on file   Highest education level: Not on file  Occupational History   Not on file  Tobacco Use   Smoking status: Every Day    Current packs/day: 1.00    Average packs/day: 1 pack/day for 45.0 years (45.0 ttl pk-yrs)    Types: Cigarettes   Smokeless tobacco: Never   Tobacco comments:    since age 8  Vaping Use   Vaping status: Never Used  Substance and Sexual Activity   Alcohol use: No    Alcohol/week: 0.0 standard drinks of alcohol   Drug use: No   Sexual activity: Yes  Other Topics Concern   Not on file  Social History Narrative   Active and independent at baseline. Lives with wife, Apolinar, and no indoor pets.   Social Drivers of Corporate investment banker Strain: Low Risk  (10/20/2023)   Received from [facility]   Overall Financial Resource Strain (CARDIA)    Difficulty of Paying Living Expenses: Not hard at all  Food Insecurity: No Food Insecurity (10/20/2023)   Received from [facility]   Hunger Vital Sign    Within the past 12 months, you worried that your food would run out before you got the money to buy more.: Never true    Within the past 12 months, the food you  bought just didn't last and you didn't have money to get more.: Never true  Transportation Needs: No Transportation Needs (10/20/2023)   Received from [facility] - Transportation    In the past 12 months, has lack of transportation kept you from medical appointments or from getting medications?: No    Lack of Transportation (Non-Medical): No  Physical Activity: Not on file  Stress: Not on file  Social Connections: Not on file  Intimate Partner Violence: Not on file   Family History  Problem Relation Age of Onset   Diabetes Mother    Hypertension Mother    Arthritis Mother    Hyperlipidemia Mother    Heart attack Father 37   Epilepsy Father    Diabetes Father    Seizures Father    Heart disease Father    Hyperlipidemia Father    Hypertension Father    Stroke Father    Healthy Sister    Diabetes Brother    Hyperlipidemia Brother    Hypertension Brother    Heart attack Brother 44   Coronary artery disease Brother    Cancer Maternal Grandmother        unknown   Diabetes Paternal Grandmother    Heart disease Paternal Grandmother    Hyperlipidemia Paternal Grandmother    Hypertension Paternal Grandmother    Heart attack Paternal Grandfather    Current Outpatient Medications on File Prior to Visit  Medication Sig   albuterol  (PROVENTIL  HFA;VENTOLIN  HFA) 108 (90 Base) MCG/ACT inhaler Inhale 2 puffs into the lungs every 4 (four) hours as needed for wheezing or shortness of breath.   aspirin  EC 81 MG tablet Take 81 mg by mouth daily.   metoprolol  succinate (TOPROL -XL) 25 MG 24 hr tablet Take 1 tablet (25 mg total) by mouth daily.   nitroGLYCERIN  (NITROSTAT ) 0.4 MG SL tablet Place 1 tablet (0.4 mg total) under the tongue every 5 (five) minutes as needed for chest pain. Max of 3 total; call 911   pantoprazole  (PROTONIX ) 40 MG tablet Take 1 tablet (40 mg total) by mouth  2 (two) times daily.   Tiotropium Bromide -Olodaterol (STIOLTO RESPIMAT) 2.5-2.5 MCG/ACT  AERS Inhale 2 inhalations into the lungs once daily   Insulin  Pen Needle 32G X 4 MM MISC Use as directed with insulin  daily (Patient not taking: Reported on 04/02/2024)   [DISCONTINUED] glipiZIDE  (GLUCOTROL  XL) 5 MG 24 hr tablet Take 1 tablet (5 mg total) by mouth daily with breakfast. Hold if morning sugar <100 or if not eating during the day   No current facility-administered medications on file prior to visit.    Review of Systems Per HPI unless specifically indicated above     Objective:    BP 122/70 (BP Location: Right Arm, Patient Position: Sitting, Cuff Size: Normal)   Pulse 79   Ht 5' 8 (1.727 m)   Wt 173 lb 3.2 oz (78.6 kg)   SpO2 96%   BMI 26.33 kg/m   Wt Readings from Last 3 Encounters:  04/02/24 173 lb 3.2 oz (78.6 kg)  02/22/24 168 lb 12.8 oz (76.6 kg)  02/12/24 170 lb (77.1 kg)    Physical Exam  Results for orders placed or performed in visit on 03/22/24  TSH   Collection Time: 03/22/24  8:04 AM  Result Value Ref Range   TSH 2.06 0.40 - 4.50 mIU/L  Microalbumin / creatinine urine ratio   Collection Time: 03/22/24  8:04 AM  Result Value Ref Range   Creatinine, Urine 116 20 - 320 mg/dL   Microalb, Ur 72.3 mg/dL   Microalb Creat Ratio 238 (H) <30 mg/g creat  PSA   Collection Time: 03/22/24  8:04 AM  Result Value Ref Range   PSA 0.39 < OR = 4.00 ng/mL  CBC with Differential/Platelet   Collection Time: 03/22/24  8:04 AM  Result Value Ref Range   WBC 10.7 3.8 - 10.8 Thousand/uL   RBC 4.99 4.20 - 5.80 Million/uL   Hemoglobin 14.3 13.2 - 17.1 g/dL   HCT 55.2 61.4 - 49.9 %   MCV 89.6 80.0 - 100.0 fL   MCH 28.7 27.0 - 33.0 pg   MCHC 32.0 32.0 - 36.0 g/dL   RDW 86.8 88.9 - 84.9 %   Platelets 242 140 - 400 Thousand/uL   MPV 10.3 7.5 - 12.5 fL   Neutro Abs 7,811 (H) 1,500 - 7,800 cells/uL   Absolute Lymphocytes 1,637 850 - 3,900 cells/uL   Absolute Monocytes 877 200 - 950 cells/uL   Eosinophils Absolute 246 15 - 500 cells/uL   Basophils Absolute 128 0 - 200  cells/uL   Neutrophils Relative % 73 %   Total Lymphocyte 15.3 %   Monocytes Relative 8.2 %   Eosinophils Relative 2.3 %   Basophils Relative 1.2 %  COMPLETE METABOLIC PANEL WITH GFR   Collection Time: 03/22/24  8:04 AM  Result Value Ref Range   Glucose, Bld 257 (H) 65 - 99 mg/dL   BUN 15 7 - 25 mg/dL   Creat 9.22 9.29 - 8.64 mg/dL   BUN/Creatinine Ratio SEE NOTE: 6 - 22 (calc)   Sodium 136 135 - 146 mmol/L   Potassium 4.5 3.5 - 5.3 mmol/L   Chloride 102 98 - 110 mmol/L   CO2 27 20 - 32 mmol/L   Calcium  9.1 8.6 - 10.3 mg/dL   Total Protein 6.3 6.1 - 8.1 g/dL   Albumin 4.1 3.6 - 5.1 g/dL   Globulin 2.2 1.9 - 3.7 g/dL (calc)   AG Ratio 1.9 1.0 - 2.5 (calc)   Total Bilirubin 1.2 0.2 - 1.2  mg/dL   Alkaline phosphatase (APISO) 155 (H) 35 - 144 U/L   AST 13 10 - 35 U/L   ALT 18 9 - 46 U/L  Hemoglobin A1c   Collection Time: 03/22/24  8:04 AM  Result Value Ref Range   Hgb A1c MFr Bld 8.5 (H) <5.7 %   Mean Plasma Glucose 197 mg/dL   eAG (mmol/L) 89.0 mmol/L  Lipid panel   Collection Time: 03/22/24  8:04 AM  Result Value Ref Range   Cholesterol 95 <200 mg/dL   HDL 33 (L) > OR = 40 mg/dL   Triglycerides 89 <849 mg/dL   LDL Cholesterol (Calc) 45 mg/dL (calc)   Total CHOL/HDL Ratio 2.9 <5.0 (calc)   Non-HDL Cholesterol (Calc) 62 <869 mg/dL (calc)      Assessment & Plan:   Problem List Items Addressed This Visit     Aortic atherosclerosis (HCC) (Chronic)   Relevant Medications   atorvastatin  (LIPITOR ) 80 MG tablet   amLODipine  (NORVASC ) 10 MG tablet   losartan  (COZAAR ) 50 MG tablet   CAD (coronary artery disease) (Chronic)   Relevant Medications   atorvastatin  (LIPITOR ) 80 MG tablet   amLODipine  (NORVASC ) 10 MG tablet   losartan  (COZAAR ) 50 MG tablet   Hyperlipidemia (Chronic)   Relevant Medications   atorvastatin  (LIPITOR ) 80 MG tablet   amLODipine  (NORVASC ) 10 MG tablet   losartan  (COZAAR ) 50 MG tablet   Type 2 diabetes mellitus with other specified complication  (HCC)   Relevant Medications   atorvastatin  (LIPITOR ) 80 MG tablet   gabapentin  (NEURONTIN ) 300 MG capsule   metFORMIN  (GLUCOPHAGE -XR) 750 MG 24 hr tablet   losartan  (COZAAR ) 50 MG tablet   MOUNJARO  5 MG/0.5ML Pen   Other Visit Diagnoses       Annual physical exam    -  Primary     Essential hypertension       Relevant Medications   atorvastatin  (LIPITOR ) 80 MG tablet   amLODipine  (NORVASC ) 10 MG tablet   losartan  (COZAAR ) 50 MG tablet     Abdominal aortic aneurysm (AAA) without rupture (HCC)       Relevant Medications   atorvastatin  (LIPITOR ) 80 MG tablet   amLODipine  (NORVASC ) 10 MG tablet   losartan  (COZAAR ) 50 MG tablet        Updated Health Maintenance information ***- Reviewed recent lab results with patient Encouraged improvement to lifestyle with diet and exercise -*** Goal of weight loss  Assessment and Plan Assessment & Plan   ***Mounjaro  5mg  weekly ORDER if approved check with Sharyle  ***Due for Colonoscopy needs new GI Colonoscopy, previous AGI, may refer [facility]  ***[facility] for the RIGHT SHOULDER and LEFT THUMB  No orders of the defined types were placed in this encounter.   Meds ordered this encounter  Medications   atorvastatin  (LIPITOR ) 80 MG tablet    Sig: Take 1 tablet (80 mg total) by mouth at bedtime.    Dispense:  90 tablet    Refill:  3   amLODipine  (NORVASC ) 10 MG tablet    Sig: Take 1 tablet (10 mg total) by mouth daily.    Dispense:  90 tablet    Refill:  3   gabapentin  (NEURONTIN ) 300 MG capsule    Sig: Take 1 capsule (300 mg total) by mouth at bedtime.    Dispense:  90 capsule    Refill:  3   metFORMIN  (GLUCOPHAGE -XR) 750 MG 24 hr tablet    Sig: Take 1 tablet (750 mg total) by  mouth 2 (two) times daily.    Dispense:  180 tablet    Refill:  3   losartan  (COZAAR ) 50 MG tablet    Sig: Take 1 tablet (50 mg total) by mouth every evening.    Dispense:  90 tablet    Refill:  3   MOUNJARO  5 MG/0.5ML Pen    Sig: Inject 5  mg into the skin once a week.    Dispense:  2 mL    Refill:  0     Follow up plan: Return in about 3 months (around 07/03/2024) for 3-4 month follow-up DM A1c, Ortho/Shoulder updates.  Marsa Officer, DO [facility] 04/02/2024, 3:04 PM

## 2024-04-02 NOTE — Patient Instructions (Addendum)
 Thank you for coming to the office today.   New colonoscopy referral to [facility] locally  Keep Amlodipine , likely this explains the swelling  Mounjaro  5mg  sent to [facility]  All other meds to [facility]  Sharyle will try to get cost savings for the Mounjaro  5mg , if unsuccessful, she can notify you and then we will need to switch to Metformin  + Glipzide until you can switch your Medicare plan next time.   Please schedule a Follow-up Appointment to: Return in about 3 months (around 07/03/2024) for 3-4 month follow-up DM A1c, Ortho/Shoulder updates.  If you have any other questions or concerns, please feel free to call the office or send a message through MyChart. You may also schedule an earlier appointment if necessary.  Additionally, you may be receiving a survey about your experience at our office within a few days to 1 week by e-mail or mail. We value your feedback.  Marsa Officer, DO [facility], [place]

## 2024-04-02 NOTE — Patient Instructions (Signed)
 Goals Addressed             This Visit's Progress    Pharmacy Goals       The goal A1c is less than 7%. This is the best way to reduce the risk of the long term complications of diabetes, including heart disease, kidney disease, eye disease, strokes, and nerve damage. An A1c of less than 7% corresponds with fasting sugars less than 130 and 2 hour after meal sugars less than 180.    Check your blood pressure once daily, and any time you have concerning symptoms like headache, chest pain, dizziness, shortness of breath, or vision changes.   Our goal is less than 130/80.  To appropriately check your blood pressure, make sure you do the following:  1) Avoid caffeine, exercise, or tobacco products for 30 minutes before checking. Empty your bladder. 2) Sit with your back supported in a flat-backed chair. Rest your arm on something flat (arm of the chair, table, etc). 3) Sit still with your feet flat on the floor, resting, for at least 5 minutes.  4) Check your blood pressure. Take 1-2 readings.  5) Write down these readings and bring with you to any provider appointments.  Bring your home blood pressure machine with you to a provider's office for accuracy comparison at least once a year.   Make sure you take your blood pressure medications before you come to any office visit, even if you were asked to fast for labs.    Thank you!   Arthur Lash, PharmD, Becky Bowels, CPP Clinical Pharmacist [facility] [phone]

## 2024-04-02 NOTE — Progress Notes (Signed)
   04/02/2024  Patient ID: Scott Rosario, male   DOB: 09-Jan-1961, 63 y.o.   MRN: [id number]   Receive a Secure chat message from PCP requesting support to patient with medication access as patient has been unable to pick up his Mounjaro  prescription. Provider planning to increase patient's Mounjaro  dose to 5 mg weekly if prescription cost affordable to patient.  Note patient previously had a $7,000 deductible with his commercial BCBS coverage at the beginning of the calendar year. In April Mounjaro  manufacturer e-voucher brought cost down to $25/month.  Note on 03/14/2024, CPhT completed PA for Mounjaro , which was approved from 03/14/2024 to 03/14/2025.  Request PCP send new prescription to pharmacy for patient. Follow up with [facility]. Patient's cost is now $0 through his BCBS commercial coverage - Follow up with patient to provide this update  Counsel on strategies to aid with medication tolerability with Mounjaro  dose increase. Advised to contact our office with more severe symptoms, including nausea, diarrhea, stomach pain. Patient verbalized understanding.  Recommend to check glucose, keep log of results and have this record to review at upcoming medical appointments. Patient to contact provider office sooner if needed for readings outside of established parameters or symptoms    Follow Up Plan: Clinical Pharmacist will follow up with patient by telephone on 05/21/2024 at 10:30 AM      Request patient contact clinical pharmacist sooner if needed for any medication access concerns.  Sharyle Sia, PharmD, [facility] [phone]

## 2024-04-05 DIAGNOSIS — I2510 Atherosclerotic heart disease of native coronary artery without angina pectoris: Secondary | ICD-10-CM | POA: Diagnosis not present

## 2024-04-05 DIAGNOSIS — I2583 Coronary atherosclerosis due to lipid rich plaque: Secondary | ICD-10-CM | POA: Diagnosis not present

## 2024-04-05 DIAGNOSIS — I252 Old myocardial infarction: Secondary | ICD-10-CM | POA: Diagnosis not present

## 2024-04-10 ENCOUNTER — Other Ambulatory Visit: Payer: Self-pay

## 2024-05-08 ENCOUNTER — Other Ambulatory Visit: Payer: Self-pay | Admitting: Family Medicine

## 2024-05-08 ENCOUNTER — Other Ambulatory Visit: Payer: Self-pay

## 2024-05-08 DIAGNOSIS — M19011 Primary osteoarthritis, right shoulder: Secondary | ICD-10-CM | POA: Diagnosis not present

## 2024-05-08 DIAGNOSIS — Z794 Long term (current) use of insulin: Secondary | ICD-10-CM

## 2024-05-08 DIAGNOSIS — E1169 Type 2 diabetes mellitus with other specified complication: Secondary | ICD-10-CM

## 2024-05-08 DIAGNOSIS — M65312 Trigger thumb, left thumb: Secondary | ICD-10-CM | POA: Diagnosis not present

## 2024-05-10 ENCOUNTER — Other Ambulatory Visit: Payer: Self-pay

## 2024-05-11 ENCOUNTER — Other Ambulatory Visit: Payer: Self-pay

## 2024-05-11 MED FILL — Tirzepatide Soln Auto-injector 5 MG/0.5ML: SUBCUTANEOUS | 28 days supply | Qty: 2 | Fill #0 | Status: CN

## 2024-05-11 NOTE — Telephone Encounter (Signed)
 Requested medication (s) are due for refill today: yes  Requested medication (s) are on the active medication list: yes  Last refill:  04/02/24  Future visit scheduled: yes  Notes to clinic:   Medication not assigned to a protocol, review manually.      Requested Prescriptions  Pending Prescriptions Disp Refills   MOUNJARO  5 MG/0.5ML Pen 2 mL 0    Sig: Inject 5 mg into the skin once a week.     Off-Protocol Failed - 05/11/2024  8:28 AM      Failed - Medication not assigned to a protocol, review manually.      Passed - Valid encounter within last 12 months    Recent Outpatient Visits           1 month ago Annual physical exam   [facility] [place], Marsa PARAS, DO   3 months ago Cellulitis of left forearm   [facility] [place], Marsa PARAS, DO   4 months ago Type 2 diabetes mellitus with other specified complication, with long-term current use of insulin  [facility])   [place] [facility] [place], Marsa PARAS, [place]

## 2024-05-14 ENCOUNTER — Other Ambulatory Visit: Payer: Self-pay

## 2024-05-21 ENCOUNTER — Other Ambulatory Visit

## 2024-05-22 ENCOUNTER — Other Ambulatory Visit: Payer: Self-pay

## 2024-06-04 ENCOUNTER — Other Ambulatory Visit (INDEPENDENT_AMBULATORY_CARE_PROVIDER_SITE_OTHER): Admitting: Pharmacist

## 2024-06-04 ENCOUNTER — Other Ambulatory Visit: Payer: Self-pay

## 2024-06-04 DIAGNOSIS — E1169 Type 2 diabetes mellitus with other specified complication: Secondary | ICD-10-CM

## 2024-06-04 DIAGNOSIS — Z794 Long term (current) use of insulin: Secondary | ICD-10-CM

## 2024-06-04 DIAGNOSIS — I10 Essential (primary) hypertension: Secondary | ICD-10-CM

## 2024-06-04 DIAGNOSIS — E119 Type 2 diabetes mellitus without complications: Secondary | ICD-10-CM

## 2024-06-04 MED ORDER — TRULICITY 0.75 MG/0.5ML ~~LOC~~ SOAJ
0.7500 mg | SUBCUTANEOUS | 1 refills | Status: DC
  Filled 2024-06-04: qty 2, 28d supply, fill #0

## 2024-06-04 NOTE — Progress Notes (Signed)
 06/04/2024 Name: CHRISTROPHER GINTZ MRN: [id number] DOB: 06-13-1961  Chief Complaint  Patient presents with   Medication Management   Medication Assistance   Medication Adherence    UZOMA VIVONA is a 63 y.o. year old male who presented for a telephone visit.   They were referred to the pharmacist by their PCP for assistance in managing diabetes and medication access.      Subjective:   Care Team: Primary Care Provider: Edman Marsa PARAS, DO ; Next Scheduled Visit: 07/10/2024 Pulmonologist: Theotis Lavelle BRAVO, MD  Cardiologist: Hilarie Rocher, MD; Next Scheduled Visit: 09/06/2024  Medication Access/Adherence  Current Pharmacy:  [facility], [place] - [address] [place] [address] Phone: [phone] Fax: [phone]  [facility] [address] [place] [address] Phone: [phone] Fax: [phone]  [id number] - [place], [place] - [address] [place] [address] Phone: [phone] Fax: [phone]   Patient reports affordability concerns with their medications: No Patient reports access/transportation concerns to their pharmacy: No  Patient reports adherence concerns with their medications:  No     Today reports having swelling in both legs, particularly left leg, that has been worse over ~past 3 weeks - From review of Office Visit documentation from PCP on 04/02/2024, note patient discussed leg swelling with PCP at that time and patient had attributed to current medication regimen (amlodipine ). However, patient shares that since that time, he self-discontinued his amlodipine  (~2-3 weeks ago) and denies any improvement in swelling - Denies heat, redness or other symptoms besides swelling - Reports swelling improves when elevates his legs when returns home in afternoon and then resolves overnight with continued elevation   Diabetes:   Current medications:  -  metformin  ER 750 mg twice daily              Today shares that he stopped Mounjaro  5 mg ~1 month ago. Reports was unable to tolerate the 5 mg weekly dose due to nausea and vomiting. Trialed for 4 weeks, but stopped when side effects did not improve  Medications tried in the past: Reports stopped Rybelsus  as did not tolerate well (nausea); insulin  glargine; Invokana ; Ozempic  (acid reflux), Mounjaro  5 mg (nausea/vomiting)    Current glucose readings: morning fasting 180-200; afternoon before supper: 130s-142      Patient denies hypoglycemic s/sx including dizziness, shakiness, sweating.  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     Objective:  Lab Results  Component Value Date   HGBA1C 8.5 (H) 03/22/2024    Lab Results  Component Value Date   CREATININE 0.77 03/22/2024   BUN 15 03/22/2024   NA 136 03/22/2024   K 4.5 03/22/2024   CL 102 03/22/2024   CO2 27 03/22/2024    Lab Results  Component Value Date   CHOL 95 03/22/2024   HDL 33 (  L) 03/22/2024   LDLCALC 45 03/22/2024   TRIG 89 03/22/2024   CHOLHDL 2.9 03/22/2024   BP Readings from Last 3 Encounters:  04/02/24 122/70  02/22/24 (!) 147/70  02/12/24 135/71   Pulse Readings from Last 3 Encounters:  04/02/24 79  02/22/24 73  02/12/24 72    Medications Reviewed Today     Reviewed by Alana Sharyle LABOR, RPH-CPP (Pharmacist) on 06/04/24 at 1104  Med List  Status: <None>   Medication Order Taking? Sig Documenting Provider Last Dose Status Informant  albuterol  (PROVENTIL  HFA;VENTOLIN  HFA) 108 (90 Base) MCG/ACT inhaler [id number]  Inhale 2 puffs into the lungs every 4 (four) hours as needed for wheezing or shortness of breath. Rolly Almarie BRAVO, NP  Active Self  amLODipine  (NORVASC ) 10 MG tablet [id number]  Take 1 tablet (10 mg total) by mouth daily.  Patient not taking: Reported on 06/04/2024   Edman Marsa PARAS, DO  Active   aspirin  EC 81 MG tablet [id number]  Take 81 mg by mouth daily. [provider]  Active Self  atorvastatin  (LIPITOR ) 80 MG tablet [id number]  Take 1 tablet (80 mg total) by mouth at bedtime. Edman Marsa PARAS, DO  Active   gabapentin  (NEURONTIN ) 300 MG capsule [id number]  Take 1 capsule (300 mg total) by mouth at bedtime. Edman Marsa PARAS, DO  Active     Discontinued 06/06/23 1011 (Change in therapy)   Insulin  Pen Needle 32G X 4 MM MISC [id number]  Use as directed with insulin  daily  Patient not taking: Reported on 04/02/2024   Tapia, Leisa, PA-C  Active   losartan  (COZAAR ) 50 MG tablet [id number] Yes Take 1 tablet (50 mg total) by mouth every evening. Edman Marsa PARAS, DO  Active   metFORMIN  (GLUCOPHAGE -XR) 750 MG 24 hr tablet [id number] Yes Take 1 tablet (750 mg total) by mouth 2 (two) times daily. Edman Marsa PARAS, DO  Active   metoprolol  succinate (TOPROL -XL) 25 MG 24 hr tablet [id number] Yes Take 1 tablet (25 mg total) by mouth daily. Edman Marsa PARAS, DO  Active   MOUNJARO  5 MG/0.5ML Pen [id number]  Inject 5 mg into the skin once a week.  Patient not taking: Reported on 06/04/2024   Edman Marsa PARAS, DO  Active   nitroGLYCERIN  (NITROSTAT ) 0.4 MG SL tablet [id number]  Place 1 tablet (0.4 mg total) under the tongue every 5 (five) minutes as needed for chest pain. Max of 3 total; call 911 Edman Marsa PARAS, DO  Active   pantoprazole  (PROTONIX ) 40 MG tablet [id number]   Take 1 tablet (40 mg total) by mouth 2 (two) times daily. Karamalegos, Marsa PARAS, DO  Active   Tiotropium Bromide -Olodaterol (STIOLTO RESPIMAT) 2.5-2.5 MCG/ACT AERS [id number]  Inhale 2 inhalations into the lungs once daily [provider]  Active               Assessment/Plan:   Patient to contact office today to schedule appointment with PCP regarding leg swelling - Will also send message to PCP to provide an update  Diabetes: - Currently uncontrolled - Reviewed long term cardiovascular and renal outcomes of uncontrolled blood sugar - Reviewed goal A1c, goal fasting, and goal 2 hour post prandial glucose - Reviewed dietary modifications including importance of having regular well-balanced meals and snacks throughout the day, while controlling carbohydrate portion sizes - Discuss diabetes medication management options. Patient interested in switching from Mounjaro  (has already self-discontinued) back to Trulicity  0.75 mg once weekly as tolerated this well in the past, provided that  this is affordable Collaborate with [facility] regarding cost and confirm patient currently has no copayment for Trulicity  0.75 mg weekly CPP sends prescription to pharmacy for patient - Recommend to check glucose, keep log of results and have this record to review at upcoming medical appointments. Patient to contact provider office sooner if needed for readings outside of established parameters or symptoms     Hypertension: - Reviewed long term cardiovascular and renal outcomes of uncontrolled blood pressure - Recommend to monitor home blood pressure, keep log of results and have this record to review at upcoming medical appointments. Patient to contact provider office sooner if needed for readings outside of established parameters or symptoms       Follow Up Plan: Clinical Pharmacist will follow up with patient by telephone on 08/03/2024 at 10:30 AM   Sharyle Sia, PharmD,  [facility] [phone]

## 2024-06-04 NOTE — Patient Instructions (Signed)
 Goals Addressed             This Visit's Progress    Pharmacy Goals       The goal A1c is less than 7%. This is the best way to reduce the risk of the long term complications of diabetes, including heart disease, kidney disease, eye disease, strokes, and nerve damage. An A1c of less than 7% corresponds with fasting sugars less than 130 and 2 hour after meal sugars less than 180.    Check your blood pressure once daily, and any time you have concerning symptoms like headache, chest pain, dizziness, shortness of breath, or vision changes.   Our goal is less than 130/80.  To appropriately check your blood pressure, make sure you do the following:  1) Avoid caffeine, exercise, or tobacco products for 30 minutes before checking. Empty your bladder. 2) Sit with your back supported in a flat-backed chair. Rest your arm on something flat (arm of the chair, table, etc). 3) Sit still with your feet flat on the floor, resting, for at least 5 minutes.  4) Check your blood pressure. Take 1-2 readings.  5) Write down these readings and bring with you to any provider appointments.  Bring your home blood pressure machine with you to a provider's office for accuracy comparison at least once a year.   Make sure you take your blood pressure medications before you come to any office visit, even if you were asked to fast for labs.    Thank you!   Arthur Lash, PharmD, Becky Bowels, CPP Clinical Pharmacist [facility] [phone]

## 2024-06-07 ENCOUNTER — Other Ambulatory Visit: Payer: Self-pay

## 2024-06-11 ENCOUNTER — Encounter: Payer: Self-pay | Admitting: Family Medicine

## 2024-06-11 ENCOUNTER — Ambulatory Visit (INDEPENDENT_AMBULATORY_CARE_PROVIDER_SITE_OTHER): Admitting: Family Medicine

## 2024-06-11 VITALS — BP 140/68 | HR 66 | Ht 68.0 in | Wt 168.4 lb

## 2024-06-11 DIAGNOSIS — I10 Essential (primary) hypertension: Secondary | ICD-10-CM

## 2024-06-11 DIAGNOSIS — I872 Venous insufficiency (chronic) (peripheral): Secondary | ICD-10-CM

## 2024-06-11 DIAGNOSIS — R600 Localized edema: Secondary | ICD-10-CM

## 2024-06-11 MED ORDER — HYDROCHLOROTHIAZIDE 12.5 MG PO TABS: 12.5000 mg | ORAL_TABLET | Freq: Every day | ORAL | 0 refills | Status: AC

## 2024-06-11 NOTE — Patient Instructions (Addendum)
 Thank you for coming to the office today.  Likely amlodipine  contributed so  remain off of that one.  Start hydrochlorothiazide  12.5mg  daily For blood pressure and help reduce fluid  Swelling will be worse with sedentary, warmer weather will increase it  Activity can help reduce swelling  Use RICE therapy: - R - Rest / relative rest with activity modification avoid overuse of joint - I - Ice packs (make sure you use a towel or sock / something to protect skin) - C - Compression with socks to apply pressure and reduce swelling allowing more support - E - Elevation - if significant swelling, lift leg above heart level (toes above your nose) to help reduce swelling, most helpful at night after day of being on your feet  Call Dr Theotis for the Lung Scan  You are good on abdominal scan for now, last done 2024. We can repeat in 2027 for next ultrasound for abdomen  If still not resolving, we can refer to Vascular specialist vein doctor.  Or sooner if need.  Please schedule a Follow-up Appointment to: Return if symptoms worsen or fail to improve.  If you have any other questions or concerns, please feel free to call the office or send a message through MyChart. You may also schedule an earlier appointment if necessary.  Additionally, you may be receiving a survey about your experience at our office within a few days to 1 week by e-mail or mail. We value your feedback.  Marsa Officer, DO [facility], [place]

## 2024-06-11 NOTE — Progress Notes (Signed)
 Subjective:    Patient ID: Scott Rosario, male    DOB: Feb 16, 1961, 63 y.o.   MRN: [id number]  Scott Rosario is a 63 y.o. male presenting on 06/11/2024 for Leg Swelling  Patient presents for a same day appointment.  HPI  Discussed the use of AI scribe software for clinical note transcription with the patient, who gave verbal consent to proceed.  History of Present Illness   Scott Rosario is a 63 year old male with hypertension who presents with bilateral leg and ankle swelling.  Bilateral lower extremity edema - Bilateral pitting edema of legs and ankles, worse by afternoon and improves overnight with leg elevation - Swelling affects both sides equally - Shoes fit tightly when swollen and loosely when swelling subsides - No unilateral swelling or pain  Peripheral sensory changes - Kneecaps feel cold to the touch - Cold feet after walking without socks for 30 to 40 minutes  Hypertension Antihypertensive medication effects - Discontinued amlodipine  six weeks ago due to suspicion of medication-induced edema - Amlodipine  initiated in January; swelling worsened around July - Edema persisted after stopping amlodipine  - Blood pressure remains stable on losartan   Recent lower extremity trauma - [place] off a tractor five to six weeks ago, injuring left ankle with significant swelling - Right ankle also developed swelling, making injury an unlikely sole cause  Cancer screening and family history concerns - Concern about family history of cancer - Recent back pain has resolved - CT scan of chest, abdomen, and pelvis one year ago was normal - Due for lung scan and in process of scheduling           04/02/2024    2:31 PM 01/17/2024    2:31 PM 09/30/2023   10:09 AM  Depression screen PHQ 2/9  Decreased Interest 0 0 0  Down, Depressed, Hopeless 0 0 0  PHQ - 2 Score 0 0 0  Altered sleeping 0 0 3  Tired, decreased energy 3 0 3  Change in appetite 0 0 0  Feeling bad or  failure about yourself  0 0 0  Trouble concentrating 0 0 0  Moving slowly or fidgety/restless 0 0 1  Suicidal thoughts 0 0 0  PHQ-9 Score 3 0 7  Difficult doing work/chores Not difficult at all Not difficult at all        04/02/2024    2:31 PM 01/17/2024    2:31 PM 09/30/2023   10:09 AM 09/15/2023    3:09 PM  GAD 7 : Generalized Anxiety Score  Nervous, Anxious, on Edge 0 0 0 2  Control/stop worrying 0 0 0 2  Worry too much - different things 0 0 0 2  Trouble relaxing 0 0 0 2  Restless 0 0 0 2  Easily annoyed or irritable 0 0 0 2  Afraid - awful might happen 0 0 0 0  Total GAD 7 Score 0 0 0 12  Anxiety Difficulty Not difficult at all Not difficult at all      Social History   Tobacco Use   Smoking status: Every Day    Current packs/day: 1.00    Average packs/day: 1 pack/day for 45.0 years (45.0 ttl pk-yrs)    Types: Cigarettes   Smokeless tobacco: Never   Tobacco comments:    since age 34  Vaping Use   Vaping status: Never Used  Substance Use Topics   Alcohol use: No    Alcohol/week: 0.0 standard drinks of alcohol  Drug use: No    Review of Systems  Constitutional:  Negative for activity change, appetite change, chills, diaphoresis, fatigue and fever.  HENT:  Negative for congestion and hearing loss.   Eyes:  Negative for visual disturbance.  Respiratory:  Negative for cough, chest tightness, shortness of breath and wheezing.   Cardiovascular:  Negative for chest pain, palpitations and leg swelling.  Gastrointestinal:  Negative for abdominal pain, constipation, diarrhea, nausea and vomiting.  Genitourinary:  Negative for dysuria, frequency and hematuria.  Musculoskeletal:  Negative for arthralgias and neck pain.  Skin:  Negative for rash.  Neurological:  Negative for dizziness, weakness, light-headedness, numbness and headaches.  Hematological:  Negative for adenopathy.  Psychiatric/Behavioral:  Negative for behavioral problems, dysphoric mood and sleep disturbance.     Per HPI unless specifically indicated above     Objective:    BP (!) 140/68 (BP Location: Left Arm, Cuff Size: Normal)   Pulse 66   Ht 5' 8 (1.727 m)   Wt 168 lb 6 oz (76.4 kg)   SpO2 97%   BMI 25.60 kg/m   Wt Readings from Last 3 Encounters:  06/11/24 168 lb 6 oz (76.4 kg)  04/02/24 173 lb 3.2 oz (78.6 kg)  02/22/24 168 lb 12.8 oz (76.6 kg)    Physical Exam Vitals and nursing note reviewed.  Constitutional:      General: He is not in acute distress.    Appearance: He is well-developed. He is not diaphoretic.     Comments: Well-appearing, comfortable, cooperative  HENT:     Head: Normocephalic and atraumatic.  Eyes:     General:        Right eye: No discharge.        Left eye: No discharge.     Conjunctiva/sclera: Conjunctivae normal.  Neck:     Thyroid: No thyromegaly.  Cardiovascular:     Rate and Rhythm: Normal rate and regular rhythm.     Pulses: Normal pulses.     Heart sounds: Normal heart sounds. No murmur heard. Pulmonary:     Effort: Pulmonary effort is normal. No respiratory distress.     Breath sounds: Normal breath sounds. No wheezing or rales.  Musculoskeletal:        General: Normal range of motion.     Cervical back: Normal range of motion and neck supple.     Right lower leg: Edema (bilateral +1-2 pitting edema, symmetrical, no erythema or tenderness) present.     Left lower leg: Edema present.  Lymphadenopathy:     Cervical: No cervical adenopathy.  Skin:    General: Skin is warm and dry.     Findings: No erythema or rash.  Neurological:     Mental Status: He is alert and oriented to person, place, and time. Mental status is at baseline.  Psychiatric:        Behavior: Behavior normal.     Comments: Well groomed, good eye contact, normal speech and thoughts     CLINICAL DATA:  Nausea and elevated alkaline phosphatase. Right lower quadrant pain. Previous appendectomy and cholecystectomy.   EXAM: CT ABDOMEN AND PELVIS WITH CONTRAST    TECHNIQUE: Multidetector CT imaging of the abdomen and pelvis was performed using the standard protocol following bolus administration of intravenous contrast.   RADIATION DOSE REDUCTION: This exam was performed according to the departmental dose-optimization program which includes automated exposure control, adjustment of the mA and/or kV according to patient size and/or use of iterative reconstruction technique.  CONTRAST:  100mL ISOVUE -300 IOPAMIDOL  (ISOVUE -300) INJECTION 61%   COMPARISON:  02/25/2014   FINDINGS: Lower chest: Lung bases are clear.   Hepatobiliary: Mild diffuse fatty change of the liver. Previous cholecystectomy. No ductal dilatation or unexpected finding.   Pancreas: Normal   Spleen: Normal   Adrenals/Urinary Tract: Adrenal glands are normal. No renal mass, stone or hydronephrosis. The bladder is normal.   Stomach/Bowel: Stomach and small intestine are normal. Previous appendectomy. No abnormal colon finding.   Vascular/Lymphatic: Aortic atherosclerosis. Maximal diameter 3.3 cm. IVC is normal. No adenopathy.   Reproductive: Normal   Other: No free fluid or air.   Musculoskeletal: Chronic degenerative disc disease L5-S1.   IMPRESSION: 1. Mild diffuse fatty change of the liver. Previous cholecystectomy. No ductal dilatation or other unexpected finding. 2. No acute bowel finding. Previous appendectomy. 3. Aortic atherosclerosis. Maximal diameter 3.3 cm. Recommend follow-up ultrasound every 3 years. This recommendation follows ACR consensus guidelines: White Paper of the ACR Incidental Findings Committee II on Vascular Findings. J Am Coll Radiol 2013; 10:789-794. 4. Chronic degenerative disc disease L5-S1.   Aortic Atherosclerosis (ICD10-I70.0).     Electronically Signed   By: Oneil Officer M.D.   On: 01/19/2023 13:24  Results for orders placed or performed in visit on 03/22/24  TSH   Collection Time: 03/22/24  8:04 AM  Result Value Ref  Range   TSH 2.06 0.40 - 4.50 mIU/L  Microalbumin / creatinine urine ratio   Collection Time: 03/22/24  8:04 AM  Result Value Ref Range   Creatinine, Urine 116 20 - 320 mg/dL   Microalb, Ur 72.3 mg/dL   Microalb Creat Ratio 238 (H) <30 mg/g creat  PSA   Collection Time: 03/22/24  8:04 AM  Result Value Ref Range   PSA 0.39 < OR = 4.00 ng/mL  CBC with Differential/Platelet   Collection Time: 03/22/24  8:04 AM  Result Value Ref Range   WBC 10.7 3.8 - 10.8 Thousand/uL   RBC 4.99 4.20 - 5.80 Million/uL   Hemoglobin 14.3 13.2 - 17.1 g/dL   HCT 55.2 61.4 - 49.9 %   MCV 89.6 80.0 - 100.0 fL   MCH 28.7 27.0 - 33.0 pg   MCHC 32.0 32.0 - 36.0 g/dL   RDW 86.8 88.9 - 84.9 %   Platelets 242 140 - 400 Thousand/uL   MPV 10.3 7.5 - 12.5 fL   Neutro Abs 7,811 (H) 1,500 - 7,800 cells/uL   Absolute Lymphocytes 1,637 850 - 3,900 cells/uL   Absolute Monocytes 877 200 - 950 cells/uL   Eosinophils Absolute 246 15 - 500 cells/uL   Basophils Absolute 128 0 - 200 cells/uL   Neutrophils Relative % 73 %   Total Lymphocyte 15.3 %   Monocytes Relative 8.2 %   Eosinophils Relative 2.3 %   Basophils Relative 1.2 %  COMPLETE METABOLIC PANEL WITH GFR   Collection Time: 03/22/24  8:04 AM  Result Value Ref Range   Glucose, Bld 257 (H) 65 - 99 mg/dL   BUN 15 7 - 25 mg/dL   Creat 9.22 9.29 - 8.64 mg/dL   BUN/Creatinine Ratio SEE NOTE: 6 - 22 (calc)   Sodium 136 135 - 146 mmol/L   Potassium 4.5 3.5 - 5.3 mmol/L   Chloride 102 98 - 110 mmol/L   CO2 27 20 - 32 mmol/L   Calcium  9.1 8.6 - 10.3 mg/dL   Total Protein 6.3 6.1 - 8.1 g/dL   Albumin 4.1 3.6 - 5.1 g/dL   Globulin  2.2 1.9 - 3.7 g/dL (calc)   AG Ratio 1.9 1.0 - 2.5 (calc)   Total Bilirubin 1.2 0.2 - 1.2 mg/dL   Alkaline phosphatase (APISO) 155 (H) 35 - 144 U/L   AST 13 10 - 35 U/L   ALT 18 9 - 46 U/L  Hemoglobin A1c   Collection Time: 03/22/24  8:04 AM  Result Value Ref Range   Hgb A1c MFr Bld 8.5 (H) <5.7 %   Mean Plasma Glucose 197 mg/dL    eAG (mmol/L) 89.0 mmol/L  Lipid panel   Collection Time: 03/22/24  8:04 AM  Result Value Ref Range   Cholesterol 95 <200 mg/dL   HDL 33 (L) > OR = 40 mg/dL   Triglycerides 89 <849 mg/dL   LDL Cholesterol (Calc) 45 mg/dL (calc)   Total CHOL/HDL Ratio 2.9 <5.0 (calc)   Non-HDL Cholesterol (Calc) 62 <869 mg/dL (calc)      Assessment & Plan:   Problem List Items Addressed This Visit   None Visit Diagnoses       Bilateral lower extremity edema    -  Primary   Relevant Medications   hydrochlorothiazide  (HYDRODIURIL ) 12.5 MG tablet     Venous insufficiency of both lower extremities       Relevant Medications   hydrochlorothiazide  (HYDRODIURIL ) 12.5 MG tablet     Essential hypertension       Relevant Medications   hydrochlorothiazide  (HYDRODIURIL ) 12.5 MG tablet        Chronic venous insufficiency with bilateral lower extremity edema Bilateral lower extremity edema likely due to chronic venous insufficiency, exacerbated by amlodipine  now since discontinued Symptoms improve with leg elevation. History is symmetrical and consistent with benign venous etiology Recent labs show no significant concern for CKD / Liver Disease / Cardiovascular etiology No history or exam finding to suggest DVT. Non painful swelling. - Prescribe hydrochlorothiazide  12.5mg  daily low dose for fluid retention and blood pressure management. - Will need follow-up labs at next apt for chemistry eval / creatinine - RICE therapy, Advise leg elevation, especially in the evenings. - Consider vascular specialist referral if symptoms persist or worsen.  Hypertension Blood pressure borderline controlled with losartan  and previously Amlodipine . Current reading 144 mmHg systolic. Home readings well-managed post-amlodipine . - Adjust losartan  dosage if needed. - Monitor blood pressure regularly at home.  Left ankle injury, improving Improved symptoms post-fall with boot use. Residual tenderness, no fracture or severe  ligament damage. - Consider imaging or podiatry referral if symptoms persist or worsen.  General Health Maintenance Discussed imaging and screenings. Lung CT due annually. Colonoscopy in progress. Addressed cancer screening and insurance limitations. - Contact Doctor Fleming's office for lung scan scheduling. - Continue with scheduled colonoscopy.        No orders of the defined types were placed in this encounter.   Meds ordered this encounter  Medications   hydrochlorothiazide  (HYDRODIURIL ) 12.5 MG tablet    Sig: Take 1 tablet (12.5 mg total) by mouth daily.    Dispense:  90 tablet    Refill:  0    Follow up plan: Return if symptoms worsen or fail to improve.   Marsa Officer, DO [facility] 06/11/2024, 2:57 PM

## 2024-07-02 ENCOUNTER — Ambulatory Visit: Payer: Self-pay

## 2024-07-02 NOTE — Telephone Encounter (Signed)
 FYI Only or Action Required?: FYI only for provider.  Patient was last seen in primary care on 06/11/2024 by Edman Marsa PARAS, DO.  Called Nurse Triage reporting Vomiting and Diarrhea.  Symptoms began several months ago.  Interventions attempted: Rest, hydration, or home remedies and OTC: Pepto Bismol.  Symptoms are: diarrhea, intermittent right sided abdominal pain (resolved), nausea, vomiting, poor appetite, chills unchanged.  Triage Disposition: See Physician Within 24 Hours (overriding See HCP Within 4 Hours (Or PCP Triage))  Patient/caregiver understands and will follow disposition?: Yes               Copied from CRM [id number]. Topic: Clinical - Red Word Triage >> Jul 02, 2024  8:40 AM Macario HERO wrote: Red Word that prompted transfer to Nurse Triage: Patient spouse called to advised that he is still throwing up non-stop and diarrhea and has not been eating so he's losing weight. Reason for Disposition  [1] SEVERE diarrhea (e.g., 7 or more times / day more than normal) AND [2] age > 60 years  Answer Assessment - Initial Assessment Questions Patient currently working, wife calling in for triage. Confirmed with wife, okay to call patient for triage.  1. DIARRHEA SEVERITY: How bad is the diarrhea? How many more stools have you had in the past 24 hours than normal?      Mostly in the morning, but states also the afternoon. After his first BM then it becomes watery. 8 times more episodes than his normal.  2. ONSET: When did the diarrhea begin?      Several months.  3. STOOL DESCRIPTION:  How loose or watery is the diarrhea? What is the stool color? Is there any blood or mucous in the stool?     Watery, green. No blood or mucous in the stool. He states it was black a few days ago (had taken Pepto Bismol on Saturday). Yesterday it was green.  4. VOMITING: Are you also vomiting? If Yes, ask: How many times in the past 24 hours?      Yes. Last episode of  vomiting was Saturday evening.  5. ABDOMEN PAIN: Are you having any abdomen pain? If Yes, ask: What does it feel like? (e.g., crampy, dull, intermittent, constant)      Intermittent, he states he was experiencing right sided abdominal pain yesterday but it has resolved.  6. ABDOMEN PAIN SEVERITY: If present, ask: How bad is the pain?  (e.g., Scale 1-10; mild, moderate, or severe)     0/10.  7. ORAL INTAKE: If vomiting, Have you been able to drink liquids? How much liquids have you had in the past 24 hours?     Yes. He states he thinks he has drank at least a gallon of fluids since yesterday.  8. HYDRATION: Any signs of dehydration? (e.g., dry mouth [not just dry lips], too weak to stand, dizziness, new weight loss) When did you last urinate?     No.  9. EXPOSURE: Have you traveled to a foreign country recently? Have you been exposed to anyone with diarrhea? Could you have eaten any food that was spoiled?     No.  10. ANTIBIOTIC USE: Are you taking antibiotics now or have you taken antibiotics in the past 2 months?       No.  11. OTHER SYMPTOMS: Do you have any other symptoms? (e.g., fever, blood in stool)       Nausea, weight loss (11-12 pounds since starting Trulicity ), poor appetite, chills. He states overall symptoms  have improved since yesterday (afternoon). He states his symptoms hit about 3-4 days after taking his Trulicity  injection. Denies fever.  12. PREGNANCY: Is there any chance you are pregnant? When was your last menstrual period?       N/A.  Protocols used: [facility]

## 2024-07-03 ENCOUNTER — Encounter: Payer: Self-pay | Admitting: Family Medicine

## 2024-07-03 ENCOUNTER — Ambulatory Visit: Admitting: Family Medicine

## 2024-07-03 VITALS — BP 144/82 | HR 68 | Ht 68.0 in | Wt 165.0 lb

## 2024-07-03 DIAGNOSIS — K219 Gastro-esophageal reflux disease without esophagitis: Secondary | ICD-10-CM | POA: Diagnosis not present

## 2024-07-03 DIAGNOSIS — R112 Nausea with vomiting, unspecified: Secondary | ICD-10-CM

## 2024-07-03 DIAGNOSIS — Z7985 Long-term (current) use of injectable non-insulin antidiabetic drugs: Secondary | ICD-10-CM

## 2024-07-03 DIAGNOSIS — R197 Diarrhea, unspecified: Secondary | ICD-10-CM | POA: Diagnosis not present

## 2024-07-03 DIAGNOSIS — E1169 Type 2 diabetes mellitus with other specified complication: Secondary | ICD-10-CM

## 2024-07-03 DIAGNOSIS — T887XXA Unspecified adverse effect of drug or medicament, initial encounter: Secondary | ICD-10-CM

## 2024-07-03 MED ORDER — ONDANSETRON 4 MG PO TBDP: 4.0000 mg | ORAL_TABLET | Freq: Three times a day (TID) | ORAL | 0 refills | Status: AC | PRN

## 2024-07-03 MED ORDER — PANTOPRAZOLE SODIUM 40 MG PO TBEC: 40.0000 mg | DELAYED_RELEASE_TABLET | Freq: Two times a day (BID) | ORAL | 1 refills | Status: AC

## 2024-07-03 NOTE — Progress Notes (Signed)
 Subjective:    Patient ID: Scott Rosario, male    DOB: Jan 27, 1961, 63 y.o.   MRN: [id number]  ONEAL Rosario is a 63 y.o. male presenting on 07/03/2024 for Nausea and Diarrhea  Patient presents for a same day appointment.   HPI  Discussed the use of AI scribe software for clinical note transcription with the patient, who gave verbal consent to proceed.  History of Present Illness   Scott Rosario is a 63 year old male who presents with nausea, diarrhea, and stomach issues.  Gastrointestinal symptoms - Nausea, diarrhea, and stomach issues ongoing for 4-6 months, worsened over the past 4 weeks after starting Trulicity  0.75 mg weekly (fourth dose taken last night) - No prior issues with Trulicity  when used last year - Persistent diarrhea until yesterday, with no bowel movements since last night - Abdominal painR sided mostly and chills resolved by Sunday afternoon, with no recurrence - Acid reflux occurred on Sunday and the day before, now resolved - Change in taste and appetite over the last 4 months, resulting in weight loss - Pepto Bismol used for stomach issues without relief  Medication-related adverse effects - Trulicity  0.75 mg weekly started 4 weeks ago, associated with worsening gastrointestinal symptoms - Previously used Mounjaro  5 mg, discontinued 8-9 weeks ago due to significant sickness - Amlodipine  discontinued 2 weeks ago, which resolved leg swelling - Metformin  currently taken - Pantoprazole  40mg  taken twice daily, 4 pills remaining; missing doses leads to significant acid reflux  Neurological and constitutional symptoms - Dizziness and headaches present over the last 4 months - Nausea and dizziness particularly prominent in the mornings - Recent episode at 5:30 AM with blood pressure 161/89 and blood sugar 118 - Chills present, resolved by Sunday afternoon   Past Surgical History:  Procedure Laterality Date   APPENDECTOMY     CARDIAC CATHETERIZATION  Left 03/09/2016   Procedure: Left Heart Cath and Coronary Angiography;  Surgeon: Wolm JINNY Rhyme, MD;  Location: [facility];  Service: Cardiovascular;  Laterality: Left;   CARDIAC CATHETERIZATION N/A 03/09/2016   Procedure: Coronary Stent Intervention;  Surgeon: Marsa Dooms, MD;  Location: [facility];  Service: Cardiovascular;  Laterality: N/A;   CHOLECYSTECTOMY N/A 01/31/2019   Procedure: LAPAROSCOPIC CHOLECYSTECTOMY - DIABETIC;  Surgeon: Nicholaus Selinda Birmingham, MD;  Location: [facility];  Service: General;  Laterality: N/A;   COLONOSCOPY WITH PROPOFOL  N/A 03/22/2019   Procedure: COLONOSCOPY WITH PROPOFOL ;  Surgeon: Jinny Carmine, MD;  Location: [facility];  Service: Endoscopy;  Laterality: N/A;  Diabetic - insulin  and oral meds   COLONOSCOPY WITH PROPOFOL  N/A 11/04/2020   Procedure: COLONOSCOPY WITH PROPOFOL ;  Surgeon: Janalyn Keene NOVAK, MD;  Location: [facility];  Service: Endoscopy;  Laterality: N/A;  COVID + 10-15-20   CORONARY ANGIOPLASTY WITH STENT PLACEMENT  10/25/2003   x 2   ESOPHAGOGASTRODUODENOSCOPY (EGD) WITH PROPOFOL  N/A 10/24/2017   Procedure: ESOPHAGOGASTRODUODENOSCOPY (EGD) WITH PROPOFOL ;  Surgeon: Jinny Carmine, MD;  Location: [facility];  Service: Endoscopy;  Laterality: N/A;  Diabetic - oral meds   ESOPHAGOGASTRODUODENOSCOPY (EGD) WITH PROPOFOL  N/A 05/01/2020   Procedure: ESOPHAGOGASTRODUODENOSCOPY (EGD) WITH PROPOFOL ;  Surgeon: Janalyn Keene NOVAK, MD;  Location: [facility];  Service: Endoscopy;  Laterality: N/A;   ESOPHAGOGASTRODUODENOSCOPY (EGD) WITH PROPOFOL  N/A 11/04/2020   Procedure: ESOPHAGOGASTRODUODENOSCOPY (EGD) WITH PROPOFOL  with biopsy;  Surgeon: Janalyn Keene NOVAK, MD;  Location: [facility];  Service: Endoscopy;  Laterality: N/A;   LEFT HEART CATH AND CORONARY ANGIOGRAPHY  Left 02/22/2024   Procedure: LEFT HEART CATH AND CORONARY ANGIOGRAPHY;  Surgeon: Florencio Cara BIRCH, MD;  Location: [facility];   Service: Cardiovascular;  Laterality: Left;   POLYPECTOMY  03/22/2019   Procedure: POLYPECTOMY;  Surgeon: Jinny Carmine, MD;  Location: [facility];  Service: Endoscopy;;   POLYPECTOMY  11/04/2020   Procedure: POLYPECTOMY;  Surgeon: Janalyn Keene NOVAK, MD;  Location: [facility];  Service: Endoscopy;;         07/03/2024   11:33 AM 04/02/2024    2:31 PM 01/17/2024    2:31 PM  Depression screen PHQ 2/9  Decreased Interest 0 0 0  Down, Depressed, Hopeless 0 0 0  PHQ - 2 Score 0 0 0  Altered sleeping 1 0 0  Tired, decreased energy 1 3 0  Change in appetite 1 0 0  Feeling bad or failure about yourself  0 0 0  Trouble concentrating 0 0 0  Moving slowly or fidgety/restless 0 0 0  Suicidal thoughts 0 0 0  PHQ-9 Score 3 3 0  Difficult doing work/chores Not difficult at all Not difficult at all Not difficult at all       07/03/2024   11:33 AM 04/02/2024    2:31 PM 01/17/2024    2:31 PM 09/30/2023   10:09 AM  GAD 7 : Generalized Anxiety Score  Nervous, Anxious, on Edge 0 0 0 0  Control/stop worrying 0 0 0 0  Worry too much - different things 0 0 0 0  Trouble relaxing  0 0 0  Restless 0 0 0 0  Easily annoyed or irritable 0 0 0 0  Afraid - awful might happen 0 0 0 0  Total GAD 7 Score  0 0 0  Anxiety Difficulty Not difficult at all Not difficult at all Not difficult at all     Social History   Tobacco Use   Smoking status: Every Day    Current packs/day: 1.00    Average packs/day: 1 pack/day for 45.0 years (45.0 ttl pk-yrs)    Types: Cigarettes   Smokeless tobacco: Never   Tobacco comments:    since age 5  Vaping Use   Vaping status: Never Used  Substance Use Topics   Alcohol use: No    Alcohol/week: 0.0 standard drinks of alcohol   Drug use: No    Review of Systems Per HPI unless specifically indicated above     Objective:    BP (!) 144/82 (BP Location: Left Arm, Cuff Size: Normal)   Pulse 68   Ht 5' 8 (1.727 m)   Wt 165 lb (74.8 kg)   SpO2 96%    BMI 25.09 kg/m   Wt Readings from Last 3 Encounters:  07/03/24 165 lb (74.8 kg)  06/11/24 168 lb 6 oz (76.4 kg)  04/02/24 173 lb 3.2 oz (78.6 kg)    Physical Exam Vitals and nursing note reviewed.  Constitutional:      General: He is not in acute distress.    Appearance: He is well-developed. He is not diaphoretic.     Comments: Well-appearing, comfortable, cooperative  HENT:     Head: Normocephalic and atraumatic.  Eyes:     General:        Right eye: No discharge.        Left eye: No discharge.     Conjunctiva/sclera: Conjunctivae normal.  Neck:     Thyroid: No thyromegaly.  Cardiovascular:     Rate and Rhythm: Normal  rate and regular rhythm.     Pulses: Normal pulses.     Heart sounds: Normal heart sounds. No murmur heard. Pulmonary:     Effort: Pulmonary effort is normal. No respiratory distress.     Breath sounds: Normal breath sounds. No wheezing or rales.  Abdominal:     General: There is no distension.     Palpations: There is no mass.     Tenderness: There is no abdominal tenderness. There is no guarding or rebound.     Hernia: No hernia is present.     Comments: Hyperactive bowel sounds.  Musculoskeletal:        General: Normal range of motion.     Cervical back: Normal range of motion and neck supple.  Lymphadenopathy:     Cervical: No cervical adenopathy.  Skin:    General: Skin is warm and dry.     Findings: No erythema or rash.  Neurological:     Mental Status: He is alert and oriented to person, place, and time. Mental status is at baseline.  Psychiatric:        Behavior: Behavior normal.     Comments: Well groomed, good eye contact, normal speech and thoughts     Results for orders placed or performed in visit on 03/22/24  TSH   Collection Time: 03/22/24  8:04 AM  Result Value Ref Range   TSH 2.06 0.40 - 4.50 mIU/L  Microalbumin / creatinine urine ratio   Collection Time: 03/22/24  8:04 AM  Result Value Ref Range   Creatinine, Urine 116 20 -  320 mg/dL   Microalb, Ur 72.3 mg/dL   Microalb Creat Ratio 238 (H) <30 mg/g creat  PSA   Collection Time: 03/22/24  8:04 AM  Result Value Ref Range   PSA 0.39 < OR = 4.00 ng/mL  CBC with Differential/Platelet   Collection Time: 03/22/24  8:04 AM  Result Value Ref Range   WBC 10.7 3.8 - 10.8 Thousand/uL   RBC 4.99 4.20 - 5.80 Million/uL   Hemoglobin 14.3 13.2 - 17.1 g/dL   HCT 55.2 61.4 - 49.9 %   MCV 89.6 80.0 - 100.0 fL   MCH 28.7 27.0 - 33.0 pg   MCHC 32.0 32.0 - 36.0 g/dL   RDW 86.8 88.9 - 84.9 %   Platelets 242 140 - 400 Thousand/uL   MPV 10.3 7.5 - 12.5 fL   Neutro Abs 7,811 (H) 1,500 - 7,800 cells/uL   Absolute Lymphocytes 1,637 850 - 3,900 cells/uL   Absolute Monocytes 877 200 - 950 cells/uL   Eosinophils Absolute 246 15 - 500 cells/uL   Basophils Absolute 128 0 - 200 cells/uL   Neutrophils Relative % 73 %   Total Lymphocyte 15.3 %   Monocytes Relative 8.2 %   Eosinophils Relative 2.3 %   Basophils Relative 1.2 %  COMPLETE METABOLIC PANEL WITH GFR   Collection Time: 03/22/24  8:04 AM  Result Value Ref Range   Glucose, Bld 257 (H) 65 - 99 mg/dL   BUN 15 7 - 25 mg/dL   Creat 9.22 9.29 - 8.64 mg/dL   BUN/Creatinine Ratio SEE NOTE: 6 - 22 (calc)   Sodium 136 135 - 146 mmol/L   Potassium 4.5 3.5 - 5.3 mmol/L   Chloride 102 98 - 110 mmol/L   CO2 27 20 - 32 mmol/L   Calcium  9.1 8.6 - 10.3 mg/dL   Total Protein 6.3 6.1 - 8.1 g/dL   Albumin 4.1 3.6 - 5.1 g/dL  Globulin 2.2 1.9 - 3.7 g/dL (calc)   AG Ratio 1.9 1.0 - 2.5 (calc)   Total Bilirubin 1.2 0.2 - 1.2 mg/dL   Alkaline phosphatase (APISO) 155 (H) 35 - 144 U/L   AST 13 10 - 35 U/L   ALT 18 9 - 46 U/L  Hemoglobin A1c   Collection Time: 03/22/24  8:04 AM  Result Value Ref Range   Hgb A1c MFr Bld 8.5 (H) <5.7 %   Mean Plasma Glucose 197 mg/dL   eAG (mmol/L) 89.0 mmol/L  Lipid panel   Collection Time: 03/22/24  8:04 AM  Result Value Ref Range   Cholesterol 95 <200 mg/dL   HDL 33 (L) > OR = 40 mg/dL    Triglycerides 89 <849 mg/dL   LDL Cholesterol (Calc) 45 mg/dL (calc)   Total CHOL/HDL Ratio 2.9 <5.0 (calc)   Non-HDL Cholesterol (Calc) 62 <869 mg/dL (calc)      Assessment & Plan:   Problem List Items Addressed This Visit     GERD (gastroesophageal reflux disease)   Relevant Medications   pantoprazole  (PROTONIX ) 40 MG tablet   ondansetron  (ZOFRAN -ODT) 4 MG disintegrating tablet   Type 2 diabetes mellitus with other specified complication (HCC)   Other Visit Diagnoses       Nausea and vomiting, unspecified vomiting type    -  Primary   Relevant Medications   ondansetron  (ZOFRAN -ODT) 4 MG disintegrating tablet     Side effect of drug         Diarrhea, unspecified type         Long-term current use of injectable noninsulin antidiabetic medication            GI Disturbance / Acute on Chronic nausea and diarrhea Type 2 Diabetes Chronic nausea and diarrhea likely due to Trulicity  and metformin , exacerbated post-cholecystectomy. No evidence of bowel obstruction. Benign abdomen on exam, hyperactive bowel sounds. No history to support infection or other acute etiology Previously on other GLP1 therapy including Trulicity   - Pause/ Stop Trulicity , monitor symptoms for one week. See if some improvement in nausea or digestive side effects - If nausea improves but diarrhea persists, stop metformin , monitor for one to two weeks.  If his symptoms are predominantly GI side effects intolerance then next steps include - Consider insulin  vs Soliqua therapy if symptoms improve off both medications. - Consult diabetes specialist for alternative management options if needed.  - Recommend over-the-counter peppermint oil (Ivy Guard) or Imodium for diarrhea. - Prescribe Zofran  dissolving tablets for nausea as needed.  Gastroesophageal reflux disease without esophagitis GERD managed with pantoprazole . Symptoms worsen if pantoprazole  missed. No esophagitis. - Continue pantoprazole  as prescribed at  high dose 40mg  BID - Reorder pantoprazole  prescription.        [facility]  No orders of the defined types were placed in this encounter.   Meds ordered this encounter  Medications   pantoprazole  (PROTONIX ) 40 MG tablet    Sig: Take 1 tablet (40 mg total) by mouth 2 (two) times daily.    Dispense:  180 tablet    Refill:  1   ondansetron  (ZOFRAN -ODT) 4 MG disintegrating tablet    Sig: Take 1 tablet (4 mg total) by mouth every 8 (eight) hours as needed for nausea or vomiting.    Dispense:  20 tablet    Refill:  0    Follow up plan: Return if symptoms worsen or fail to improve.   Marsa Officer, DO [facility] 07/03/2024, 11:38 AM

## 2024-07-03 NOTE — Patient Instructions (Addendum)
 Thank you for coming to the office today.  I think the nausea and upset stomach is likely from the Trulicity   The diarrhea is likely metformin  related side effect  Continue metformin  for now, remain OFF Trulicity  next week. See how you feel.  If nausea is better, then we can blame Trulicity   If diarrhea is persistent, then STOP metformin  for another 1-2 weeks and see how you feel.  I do think the diarrhea is related to history of gallbladder surgery and the medication side effect.  If you feel better off of both medicines, we may need to consider insulin  next. Or a diabetic doctor.  Refilled Pantoprazole   Add Zofran  dissolving tab if needed.  IBGard OTC Peppermint Oil (Triple Coated Capsule) 180mg  take one 3 times daily to reduce diarrhea   Please schedule a Follow-up Appointment to: Return if symptoms worsen or fail to improve.  If you have any other questions or concerns, please feel free to call the office or send a message through MyChart. You may also schedule an earlier appointment if necessary.  Additionally, you may be receiving a survey about your experience at our office within a few days to 1 week by e-mail or mail. We value your feedback.  Marsa Officer, DO [facility], [place]

## 2024-07-10 ENCOUNTER — Ambulatory Visit: Admitting: Family Medicine

## 2024-07-24 ENCOUNTER — Encounter: Payer: Self-pay | Admitting: Family Medicine

## 2024-07-24 ENCOUNTER — Ambulatory Visit (INDEPENDENT_AMBULATORY_CARE_PROVIDER_SITE_OTHER): Admitting: Family Medicine

## 2024-07-24 VITALS — BP 132/80 | HR 64 | Ht 68.0 in | Wt 164.1 lb

## 2024-07-24 DIAGNOSIS — E1169 Type 2 diabetes mellitus with other specified complication: Secondary | ICD-10-CM

## 2024-07-24 DIAGNOSIS — I10 Essential (primary) hypertension: Secondary | ICD-10-CM

## 2024-07-24 DIAGNOSIS — Z7984 Long term (current) use of oral hypoglycemic drugs: Secondary | ICD-10-CM | POA: Diagnosis not present

## 2024-07-24 LAB — POCT GLYCOSYLATED HEMOGLOBIN (HGB A1C): Hemoglobin A1C: 7.5 % — AB (ref 4.0–5.6)

## 2024-07-24 NOTE — Patient Instructions (Addendum)
 Thank you for coming to the office today.  I will contact Sharyle and discuss next medicine options  Januvia , or Jardiance  And continue Metformin  XR 750 x 2 = 1500mg  daily for now  Recent Labs    12/16/23 1329 03/22/24 0804 07/24/24 1011  HGBA1C 7.1* 8.5* 7.5*   --------------------   You call GI schedule Colonoscopy, referral is still active. If they need I can order.  [facility] ([facility])  [address] - [address] [place], [place] [address] Phone: [phone]    Please schedule a Follow-up Appointment to: Return for 4 month DM A1c.  If you have any other questions or concerns, please feel free to call the office or send a message through MyChart. You may also schedule an earlier appointment if necessary.  Additionally, you may be receiving a survey about your experience at our office within a few days to 1 week by e-mail or mail. We value your feedback.  Marsa Officer, DO [facility], [place]

## 2024-07-24 NOTE — Progress Notes (Signed)
 Subjective:    Patient ID: Scott Rosario, male    DOB: [birth year], 63 y.o.   MRN: [id number]  Scott Rosario is a 63 y.o. male presenting on 07/24/2024 for Diabetes   HPI  Discussed the use of AI scribe software for clinical note transcription with the patient, who gave verbal consent to proceed.  History of Present Illness   Scott Rosario is a 63 year old male with type 2 diabetes who presents for a follow-up on blood sugar management.  Type 2 Diabetes Glycemic control updates - Type 2 diabetes mellitus with recent HbA1c of 7.5%, improved from 8.5% - Recent course 2-3 weeks ago Trulicity  discontinued due to GI intolerance nausea vomiting diarrhea, and symptoms have resolved off of Trulicity  - Current regimen: metformin  extended-release 750 mg twice daily (total 1500 mg/day) - No longer taking glipizide  after discontinuation - History of multiple diabetes medications: Invokana , insulin  glargine, Ozempic , Mounjaro , Rybelsus   Colorectal cancer screening - Colonoscopy ordered in July not yet scheduled - Reluctance to schedule due to concerns about the preparation process  Pulmonary nodule surveillance - Needs to contact Dr. Theotis to reschedule a CT scan for lung surveillance - Last CT scan performed in August of the previous year           07/03/2024   11:33 AM 04/02/2024    2:31 PM 01/17/2024    2:31 PM  Depression screen PHQ 2/9  Decreased Interest 0 0 0  Down, Depressed, Hopeless 0 0 0  PHQ - 2 Score 0 0 0  Altered sleeping 1 0 0  Tired, decreased energy 1 3 0  Change in appetite 1 0 0  Feeling bad or failure about yourself  0 0 0  Trouble concentrating 0 0 0  Moving slowly or fidgety/restless 0 0 0  Suicidal thoughts 0 0 0  PHQ-9 Score 3 3 0  Difficult doing work/chores Not difficult at all Not difficult at all Not difficult at all       07/03/2024   11:33 AM 04/02/2024    2:31 PM 01/17/2024    2:31 PM 09/30/2023   10:09 AM  GAD 7 : Generalized Anxiety  Score  Nervous, Anxious, on Edge 0 0 0 0  Control/stop worrying 0 0 0 0  Worry too much - different things 0 0 0 0  Trouble relaxing  0 0 0  Restless 0 0 0 0  Easily annoyed or irritable 0 0 0 0  Afraid - awful might happen 0 0 0 0  Total GAD 7 Score  0 0 0  Anxiety Difficulty Not difficult at all Not difficult at all Not difficult at all     Social History   Tobacco Use   Smoking status: Every Day    Current packs/day: 1.00    Average packs/day: 1 pack/day for 45.0 years (45.0 ttl pk-yrs)    Types: Cigarettes   Smokeless tobacco: Never   Tobacco comments:    since age 69  Vaping Use   Vaping status: Never Used  Substance Use Topics   Alcohol use: No    Alcohol/week: 0.0 standard drinks of alcohol   Drug use: No    Review of Systems Per HPI unless specifically indicated above     Objective:    BP 132/80 (BP Location: Left Arm, Patient Position: Sitting, Cuff Size: Normal)   Pulse 64   Ht 5' 8 (1.727 m)   Wt 164 lb 2 oz (74.4 kg)  SpO2 98%   BMI 24.96 kg/m   Wt Readings from Last 3 Encounters:  07/24/24 164 lb 2 oz (74.4 kg)  07/03/24 165 lb (74.8 kg)  06/11/24 168 lb 6 oz (76.4 kg)    Physical Exam Vitals and nursing note reviewed.  Constitutional:      General: He is not in acute distress.    Appearance: Normal appearance. He is well-developed. He is not diaphoretic.     Comments: Well-appearing, comfortable, cooperative  HENT:     Head: Normocephalic and atraumatic.  Eyes:     General:        Right eye: No discharge.        Left eye: No discharge.     Conjunctiva/sclera: Conjunctivae normal.  Cardiovascular:     Rate and Rhythm: Normal rate.  Pulmonary:     Effort: Pulmonary effort is normal.  Skin:    General: Skin is warm and dry.     Findings: No erythema or rash.  Neurological:     Mental Status: He is alert and oriented to person, place, and time.  Psychiatric:        Mood and Affect: Mood normal.        Behavior: Behavior normal.         Thought Content: Thought content normal.     Comments: Well groomed, good eye contact, normal speech and thoughts     I have personally reviewed the radiology report from 05/03/23 on LDCT .  Narrative & Impression  CLINICAL DATA:  Pulmonary nodule follow-up.   EXAM: CT CHEST WITHOUT CONTRAST   TECHNIQUE: Multidetector CT imaging of the chest was performed following the standard protocol without IV contrast.   RADIATION DOSE REDUCTION: This exam was performed according to the departmental dose-optimization program which includes automated exposure control, adjustment of the mA and/or kV according to patient size and/or use of iterative reconstruction technique.   COMPARISON:  07/13/2022   FINDINGS: Cardiovascular: The heart size is normal. No substantial pericardial effusion. Coronary artery calcification is evident. Mild atherosclerotic calcification is noted in the wall of the thoracic aorta.   Mediastinum/Nodes: No mediastinal lymphadenopathy. No evidence for gross hilar lymphadenopathy although assessment is limited by the lack of intravenous contrast on the current study. The esophagus has normal imaging features. There is no axillary lymphadenopathy.   Lungs/Pleura: Centrilobular and paraseptal emphysema evident. Bullous changes in the apices, right greater than left, similar to prior.   The 4 mm left lower lobe pulmonary identified as new on the previous study has resolved completely in the interval. Additional scattered tiny bilateral pulmonary nodules are stable in the interval (see right middle lobe image 78/3 with 5 mm left lower lobe pulmonary nodule on 139/3. Right middle lobe perifissural nodule on 99/3 is stable.   No new suspicious pulmonary nodule or mass. No focal airspace consolidation. No pleural effusion.   Upper Abdomen: Small cyst again noted upper pole left kidney, stable since 02/24/2017. No followup imaging is recommended.    Musculoskeletal: No worrisome lytic or sclerotic osseous abnormality.   IMPRESSION: 1. Interval resolution of the 4 mm left lower lobe pulmonary nodule identified as new on the previous study. 2. Additional scattered tiny bilateral pulmonary nodules are stable in the interval. No new suspicious pulmonary nodule or mass. 3. Aortic Atherosclerosis (ICD10-I70.0) and Emphysema (ICD10-J43.9).     Electronically Signed   By: Camellia Candle M.D.   On: 05/08/2023 07:11      Results for orders placed or  performed in visit on 07/24/24  POCT HgB A1C   Collection Time: 07/24/24 10:11 AM  Result Value Ref Range   Hemoglobin A1C 7.5 (A) 4.0 - 5.6 %   HbA1c POC (<> result, manual entry)     HbA1c, POC (prediabetic range)     HbA1c, POC (controlled diabetic range)        Assessment & Plan:   Problem List Items Addressed This Visit     Type 2 diabetes mellitus with other specified complication (HCC) - Primary   Relevant Orders   POCT HgB A1C (Completed)   Other Visit Diagnoses       Essential hypertension         Long term current use of oral hypoglycemic drug            Type 2 diabetes mellitus Improved glycemic control with A1c reduced to 7.5% from 8.5% however this reflects recent course in 3 months but recent issue is Trulicity  discontinued 2+ week sago due to side effects GI nausea vomiting diarrhea, now GI side effects resolved. Anticipated increase in glucose levels post-Trulicity .  Previous med failures due to side effects Rybelsus , Ozempic , Trulicity , Lantus , Invokana   Consider Januvia  and Jardiance preferred for better tolerance and coverage.  - Continue metformin  XR 750 mg twice daily. = 1500mg  daily, we could reconsider IR 1000mg  TWICE A DAY in future if preferred but this was causing some GI side effect as well - Next therapy add on - Consider adding Januvia  or Jardiance based on insurance coverage and cost.  - Route chart / Comptroller for clinical pharmacy,  she has worked with patient before, we can coordinate on cost coverage of Januvia  vs Jardiance or other options and implement strategy. She has call with him already scheduled 08/03/24  - Monitor blood glucose levels and reassess in 3-4 months.      Hypertension Controlled hydrochlorothiazide  12.5mg  daily, Losartan  50mg  daily, Metoprolol  XL 25mg  daily    Orders Placed This Encounter  Procedures   POCT HgB A1C    No orders of the defined types were placed in this encounter.   Follow up plan: Return for 4 month DM A1c.  Marsa Officer, DO [facility] 07/24/2024, 10:13 AM

## 2024-07-25 ENCOUNTER — Other Ambulatory Visit (HOSPITAL_COMMUNITY): Payer: Self-pay

## 2024-07-25 ENCOUNTER — Telehealth: Payer: Self-pay

## 2024-07-25 MED ORDER — JANUVIA 100 MG PO TABS: 100.0000 mg | ORAL_TABLET | Freq: Every day | ORAL | 1 refills | Status: AC

## 2024-07-25 NOTE — Addendum Note (Signed)
 Addended by: EDMAN MARSA PARAS on: 07/25/2024 05:42 PM   Modules accepted: Orders

## 2024-07-25 NOTE — Telephone Encounter (Signed)
 Pharmacy Patient Advocate Encounter  Insurance verification completed.   The patient is insured through [facility]   Ran test claim for Januvia . Currently a quantity of 90 is a 90 day supply and the co-pay is $0 .   This test claim was processed through [facility]- copay amounts may vary at other pharmacies due to pharmacy/plan contracts, or as the patient moves through the different stages of their insurance plan.

## 2024-08-03 ENCOUNTER — Other Ambulatory Visit

## 2024-08-03 ENCOUNTER — Telehealth: Payer: Self-pay | Admitting: Pharmacist

## 2024-08-03 NOTE — Progress Notes (Signed)
   Outreach Note  08/03/2024 Name: Scott Rosario MRN: [id number] DOB: [birth year]  Referred by: Edman Marsa PARAS, DO  Was unable to reach patient via telephone today and have left HIPAA compliant voicemail asking patient to return my call.   Follow Up Plan: Will collaborate with Care Guide to outreach to schedule follow up with me  Sharyle Sia, PharmD, JAQUELINE, CPP Clinical Pharmacist [facility] [phone]

## 2024-08-15 ENCOUNTER — Ambulatory Visit: Admitting: Family Medicine

## 2024-08-16 ENCOUNTER — Ambulatory Visit (INDEPENDENT_AMBULATORY_CARE_PROVIDER_SITE_OTHER): Admitting: Family Medicine

## 2024-08-16 ENCOUNTER — Encounter: Payer: Self-pay | Admitting: Family Medicine

## 2024-08-16 VITALS — BP 142/84 | HR 65 | Ht 68.0 in | Wt 165.0 lb

## 2024-08-16 DIAGNOSIS — R918 Other nonspecific abnormal finding of lung field: Secondary | ICD-10-CM | POA: Diagnosis not present

## 2024-08-16 DIAGNOSIS — J432 Centrilobular emphysema: Secondary | ICD-10-CM | POA: Diagnosis not present

## 2024-08-16 DIAGNOSIS — E1169 Type 2 diabetes mellitus with other specified complication: Secondary | ICD-10-CM

## 2024-08-16 DIAGNOSIS — Z7984 Long term (current) use of oral hypoglycemic drugs: Secondary | ICD-10-CM

## 2024-08-16 DIAGNOSIS — I2583 Coronary atherosclerosis due to lipid rich plaque: Secondary | ICD-10-CM

## 2024-08-16 DIAGNOSIS — R413 Other amnesia: Secondary | ICD-10-CM | POA: Diagnosis not present

## 2024-08-16 DIAGNOSIS — R0602 Shortness of breath: Secondary | ICD-10-CM

## 2024-08-16 DIAGNOSIS — I2510 Atherosclerotic heart disease of native coronary artery without angina pectoris: Secondary | ICD-10-CM

## 2024-08-16 DIAGNOSIS — R5383 Other fatigue: Secondary | ICD-10-CM

## 2024-08-16 DIAGNOSIS — I872 Venous insufficiency (chronic) (peripheral): Secondary | ICD-10-CM

## 2024-08-16 MED ORDER — STIOLTO RESPIMAT 2.5-2.5 MCG/ACT IN AERS
INHALATION_SPRAY | RESPIRATORY_TRACT | 2 refills | Status: DC
Start: 2024-08-16 — End: 2024-08-22

## 2024-08-16 NOTE — Patient Instructions (Addendum)
 Thank you for coming to the office today.  Referral to new Lung Spceialist for further evaluation.  [facility] [address] [place],  [place]  [address] Main: [phone]  The swelling in legs is consistent with venous stasis / insufficiency / fluid retention  Not harmful, but we can monitor it and goal to help swelling by elevating in the evening  Use RICE therapy: - R - Rest / relative rest with activity modification avoid overuse of joint - I - Ice packs (make sure you use a towel or sock / something to protect skin) - C - Compression with ACE wrap to apply pressure and reduce swelling allowing more support - E - Elevation - if significant swelling, lift leg above heart level (toes above your nose) to help reduce swelling, most helpful at night after day of being on your feet   I recommend referral to Neurologist for further evaluation and they can do memory / cognitive eval  [facility]   Address: [address] [place], [place] [address] Hours: 8AM-5PM Phone: [phone]  Please schedule a Follow-up Appointment to: Return if symptoms worsen or fail to improve.  If you have any other questions or concerns, please feel free to call the office or send a message through MyChart. You may also schedule an earlier appointment if necessary.  Additionally, you may be receiving a survey about your experience at our office within a few days to 1 week by e-mail or mail. We value your feedback.  Marsa Officer, DO [facility], [place]

## 2024-08-16 NOTE — Progress Notes (Signed)
 Subjective:    Patient ID: Scott Rosario, male    DOB: Nov 28, 1960, 63 y.o.   MRN: [id number]  Scott Rosario is a 63 y.o. male presenting on 08/16/2024 for Medical Management of Chronic Issues   HPI  Discussed the use of AI scribe software for clinical note transcription with the patient, who gave verbal consent to proceed.  History of Present Illness   Scott Rosario is a 63 year old male who presents with concerns about lung health, diarrhea, and ankle swelling.  Emphysema Pulmonary nodule surveillance - Concerns regarding lung health from patient and his wife - Uses Stiolto inhaler, two puffs at night, three times per week. He ran out of med, needs refill - Has been without Stiolto inhaler for the past two to three days and requests a refill. - Does not use daytime inhaler for wheezing frequently. Albuterol  - Previously followed by Dr Theotis at [facility], asking about transfer to other provider - Overdue for LDCT screening with prior nodules - Worsening fatigue vs dyspnea. Question oxygen status  Diarrheal illness - Experienced diarrhea one to two weeks ago, lasting three to four days. - Symptoms occurred while out of town and without prescribed medication. - Took a large dose of Pepto Bismol, which resolved the diarrhea.  Venous insufficiency / Peripheral edema - Swelling in the ankle for approximately one week. - Edema decreases overnight but recurs by mid-morning, causing shoe tightness. - No prior history of ankle swelling. - Low dietary salt intake.  Cognitive changes - Wife has observed episodes of confusion. - Occasional confusion and distractibility, attributed to stress and having a lot on his mind. - Recent onset after turning 63 years old. - History of hearing loss, which may contribute to confusion.  Hypertension - History of elevated blood pressure. - Blood pressure has been higher this week due to stress. - Blood pressure was better during  previous visits.  On current medication regimen.     Type 2 Diabetes Glycemic control updates - Type 2 diabetes mellitus with recent HbA1c of 7.5%, improved from 8.5% Off Trulicity  - Current regimen: Januvia  10mg , metformin  extended-release 750 mg twice daily (total 1500 mg/day) - History of multiple diabetes medications: Invokana , insulin  glargine, Ozempic , Mounjaro , Rybelsus  Trulicity          08/16/2024   11:06 AM 07/03/2024   11:33 AM 04/02/2024    2:31 PM  Depression screen PHQ 2/9  Decreased Interest 0 0 0  Down, Depressed, Hopeless 0 0 0  PHQ - 2 Score 0 0 0  Altered sleeping 0 1 0  Tired, decreased energy 0 1 3  Change in appetite 0 1 0  Feeling bad or failure about yourself  0 0 0  Trouble concentrating 0 0 0  Moving slowly or fidgety/restless 0 0 0  Suicidal thoughts 0 0 0  PHQ-9 Score 0 3  3   Difficult doing work/chores  Not difficult at all Not difficult at all     Data saved with a previous flowsheet row definition       08/16/2024   11:06 AM 07/03/2024   11:33 AM 04/02/2024    2:31 PM 01/17/2024    2:31 PM  GAD 7 : Generalized Anxiety Score  Nervous, Anxious, on Edge 0 0 0 0  Control/stop worrying 0 0 0 0  Worry too much - different things 0 0 0 0  Trouble relaxing 0  0 0  Restless 0 0 0 0  Easily annoyed  or irritable 0 0 0 0  Afraid - awful might happen 0 0 0 0  Total GAD 7 Score 0  0 0  Anxiety Difficulty  Not difficult at all Not difficult at all Not difficult at all    Social History   Tobacco Use   Smoking status: Every Day    Current packs/day: 1.00    Average packs/day: 1 pack/day for 45.0 years (45.0 ttl pk-yrs)    Types: Cigarettes   Smokeless tobacco: Never   Tobacco comments:    since age 53  Vaping Use   Vaping status: Never Used  Substance Use Topics   Alcohol use: No    Alcohol/week: 0.0 standard drinks of alcohol   Drug use: No    Review of Systems Per HPI unless specifically indicated above     Objective:    BP (!)  142/84 (BP Location: Left Arm, Patient Position: Sitting, Cuff Size: Normal)   Pulse 65   Ht 5' 8 (1.727 m)   Wt 165 lb (74.8 kg)   SpO2 95%   BMI 25.09 kg/m   Wt Readings from Last 3 Encounters:  08/16/24 165 lb (74.8 kg)  07/24/24 164 lb 2 oz (74.4 kg)  07/03/24 165 lb (74.8 kg)    Physical Exam Vitals and nursing note reviewed.  Constitutional:      General: He is not in acute distress.    Appearance: He is well-developed. He is not diaphoretic.     Comments: Well-appearing, comfortable, cooperative  HENT:     Head: Normocephalic and atraumatic.  Eyes:     General:        Right eye: No discharge.        Left eye: No discharge.     Conjunctiva/sclera: Conjunctivae normal.  Neck:     Thyroid: No thyromegaly.  Cardiovascular:     Rate and Rhythm: Normal rate and regular rhythm.     Pulses: Normal pulses.     Heart sounds: Normal heart sounds. No murmur heard. Pulmonary:     Effort: Pulmonary effort is normal. No respiratory distress.     Breath sounds: Wheezing present. No rales.  Musculoskeletal:        General: Normal range of motion.     Cervical back: Normal range of motion and neck supple.     Right lower leg: Edema (trace) present.     Left lower leg: Edema (trace) present.  Lymphadenopathy:     Cervical: No cervical adenopathy.  Skin:    General: Skin is warm and dry.     Findings: No erythema or rash.  Neurological:     Mental Status: He is alert and oriented to person, place, and time. Mental status is at baseline.  Psychiatric:        Behavior: Behavior normal.     Comments: Well groomed, good eye contact, normal speech and thoughts     I have personally reviewed the radiology report from 05/03/23 LDCT  CLINICAL DATA:  Pulmonary nodule follow-up.   EXAM: CT CHEST WITHOUT CONTRAST   TECHNIQUE: Multidetector CT imaging of the chest was performed following the standard protocol without IV contrast.   RADIATION DOSE REDUCTION: This exam was  performed according to the departmental dose-optimization program which includes automated exposure control, adjustment of the mA and/or kV according to patient size and/or use of iterative reconstruction technique.   COMPARISON:  07/13/2022   FINDINGS: Cardiovascular: The heart size is normal. No substantial pericardial effusion. Coronary artery calcification  is evident. Mild atherosclerotic calcification is noted in the wall of the thoracic aorta.   Mediastinum/Nodes: No mediastinal lymphadenopathy. No evidence for gross hilar lymphadenopathy although assessment is limited by the lack of intravenous contrast on the current study. The esophagus has normal imaging features. There is no axillary lymphadenopathy.   Lungs/Pleura: Centrilobular and paraseptal emphysema evident. Bullous changes in the apices, right greater than left, similar to prior.   The 4 mm left lower lobe pulmonary identified as new on the previous study has resolved completely in the interval. Additional scattered tiny bilateral pulmonary nodules are stable in the interval (see right middle lobe image 78/3 with 5 mm left lower lobe pulmonary nodule on 139/3. Right middle lobe perifissural nodule on 99/3 is stable.   No new suspicious pulmonary nodule or mass. No focal airspace consolidation. No pleural effusion.   Upper Abdomen: Small cyst again noted upper pole left kidney, stable since 02/24/2017. No followup imaging is recommended.   Musculoskeletal: No worrisome lytic or sclerotic osseous abnormality.   IMPRESSION: 1. Interval resolution of the 4 mm left lower lobe pulmonary nodule identified as new on the previous study. 2. Additional scattered tiny bilateral pulmonary nodules are stable in the interval. No new suspicious pulmonary nodule or mass. 3. Aortic Atherosclerosis (ICD10-I70.0) and Emphysema (ICD10-J43.9).     Electronically Signed   By: Camellia Candle M.D.   On: 05/08/2023 07:11      08/16/2024   11:39 AM  6CIT Screen  What Year? 0 points  What month? 0 points  What time? 0 points  Count back from 20 0 points  Months in reverse 0 points  Repeat phrase 0 points  Total Score 0 points      Results for orders placed or performed in visit on 07/24/24  POCT HgB A1C   Collection Time: 07/24/24 10:11 AM  Result Value Ref Range   Hemoglobin A1C 7.5 (A) 4.0 - 5.6 %   HbA1c POC (<> result, manual entry)     HbA1c, POC (prediabetic range)     HbA1c, POC (controlled diabetic range)        Assessment & Plan:   Problem List Items Addressed This Visit     CAD (coronary artery disease) (Chronic)   Multiple pulmonary nodules determined by computed tomography of lung (Chronic)   Relevant Orders   Ambulatory referral to Pulmonology   Centrilobular emphysema (HCC) - Primary   Relevant Medications   Tiotropium Bromide -Olodaterol (STIOLTO RESPIMAT) 2.5-2.5 MCG/ACT AERS   Other Relevant Orders   Ambulatory referral to Pulmonology   Type 2 diabetes mellitus with other specified complication (HCC)   Other Visit Diagnoses       Memory loss       Relevant Orders   Ambulatory referral to Neurology     Long term current use of oral hypoglycemic drug         Venous insufficiency of both lower extremities         Shortness of breath         Fatigue, unspecified type            Centrilobular emphysema Multiple Pulm Nodules Followed by [facility] Pulmonology Dr Theotis Mixed results with therapy at this point - now requesting transfer of care to new provider. I agree with this plan to switch to [facility] Pul[facility]ology location for further diagnostic, evaluation and management. He may warrant overnight oximetry testing as well  Re order Stiolto as he ran out of maintenance therapy -  Referred to new pulmonologist in [place]. - Plan for annual lung CT scan before year-end. Requested LDCT Screening resumed.  Chronic venous insufficiency with lower extremity  edema Intermittent edema consistent with venous stasis. Swelling primarily in afternoon, resolves overnight. No major harm anticipated, requires monitoring. - Advised caution with salt intake and maintaining hydration. - Recommended leg elevation and compression.  Cognitive symptoms (memory loss, confusion) Occasional confusion and memory issues, possibly age and stress-related. Hearing loss may contribute. Screening advised. - Administer 6CIT screening, he passed with score 0 (normal screening) - Referred to neurologist for further evaluation to discuss other neuro cognitive eval. I have significant concerns with his high risk cardiovascular history could be a factor with his memory symptoms. He may warrant further neuro imaging  Hypertension Blood pressure elevated, possibly due to recent stress. Previous readings were better. BP improved, suboptimal control  Type 2 Diabetes CAD Hypertension Tobacco abuse He is on ASA 81 and Atorvstatin 80mg  On Januvia , Metoprolol  ARB  Consider future VBCI referral to RN.    Followed by [facility] - Missed Sharyle Call 11/7 - re-schedule will route chart   Orders Placed This Encounter  Procedures   Ambulatory referral to Pulmonology    Referral Priority:   Routine    Referral Type:   Consultation    Referral Reason:   Specialty Services Required    Requested Specialty:   Pulmonary Disease    Number of Visits Requested:   1   Ambulatory referral to Neurology    Referral Priority:   Routine    Referral Type:   Consultation    Referral Reason:   Specialty Services Required    Requested Specialty:   Neurology    Number of Visits Requested:   1    Meds ordered this encounter  Medications   Tiotropium Bromide -Olodaterol (STIOLTO RESPIMAT) 2.5-2.5 MCG/ACT AERS    Sig: Inhale 2 inhalations into the lungs once daily    Dispense:  4 g    Refill:  2    Follow up plan: Return if symptoms worsen or fail to improve.  Marsa Officer, DO [facility] 08/16/2024, 11:20 AM

## 2024-08-22 ENCOUNTER — Encounter: Payer: Self-pay | Admitting: Student in an Organized Health Care Education/Training Program

## 2024-08-22 ENCOUNTER — Encounter: Payer: Self-pay | Admitting: Emergency Medicine

## 2024-08-22 ENCOUNTER — Ambulatory Visit (INDEPENDENT_AMBULATORY_CARE_PROVIDER_SITE_OTHER): Admitting: Student in an Organized Health Care Education/Training Program

## 2024-08-22 VITALS — BP 150/84 | HR 61 | Temp 97.6°F | Ht 68.0 in | Wt 163.0 lb

## 2024-08-22 DIAGNOSIS — J439 Emphysema, unspecified: Secondary | ICD-10-CM

## 2024-08-22 DIAGNOSIS — Z148 Genetic carrier of other disease: Secondary | ICD-10-CM

## 2024-08-22 DIAGNOSIS — F17210 Nicotine dependence, cigarettes, uncomplicated: Secondary | ICD-10-CM | POA: Diagnosis not present

## 2024-08-22 DIAGNOSIS — F17200 Nicotine dependence, unspecified, uncomplicated: Secondary | ICD-10-CM

## 2024-08-22 DIAGNOSIS — J432 Centrilobular emphysema: Secondary | ICD-10-CM

## 2024-08-22 MED ORDER — NICOTINE POLACRILEX 4 MG MT LOZG
4.0000 mg | LOZENGE | OROMUCOSAL | 0 refills | Status: AC | PRN
Start: 2024-08-22 — End: ?

## 2024-08-22 MED ORDER — TRELEGY ELLIPTA 100-62.5-25 MCG/ACT IN AEPB: 1.0000 | INHALATION_SPRAY | Freq: Every day | RESPIRATORY_TRACT | 11 refills | Status: AC

## 2024-08-22 NOTE — Patient Instructions (Signed)
  VISIT SUMMARY: You came in today for evaluation of your COPD and emphysema, which have been causing you significant shortness of breath, cough, and chest tightness. We discussed your symptoms, smoking history, and previous treatments. We also reviewed your history of coronary artery disease and occupational exposures.  YOUR PLAN: -CHRONIC OBSTRUCTIVE PULMONARY DISEASE (COPD) WITH EMPHYSEMA: COPD with emphysema is a chronic lung condition that makes it hard to breathe due to damaged airways and air sacs. Your symptoms have worsened over the past three years. We are switching your inhaler from Stiolto to Trelegy, which you should take one puff of daily in the morning. We also ordered a pulmonary function test to establish a new baseline and enrolled you in a lung cancer screening program for annual CT scans.  -NICOTINE  DEPENDENCE: Nicotine  dependence is an addiction to tobacco products. You have a long history of smoking and have found it difficult to quit. We discussed using nicotine  lozenges to help you stop smoking. You should use the lozenges whenever you have cravings.  INSTRUCTIONS: Please follow up with the pulmonary function test as ordered. Continue using Trelegy as directed and attend your annual CT scans as part of the lung cancer screening program. Use nicotine  lozenges to help manage cravings and work towards quitting smoking.   Contains text generated by [employer].

## 2024-08-22 NOTE — Progress Notes (Signed)
 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 Smoking and occupational exposure history reviewed and certainly contributed to symptoms. He was previously tested for alpha-1 anti-trypsin with MZ phenotype noted, and value of 101 in 2016. Current inhaler provides partial relief, and I will transition from LABA/LAMA (stiolto) to LABA/LAMA/ICS (Trelegy). Will order PFT's to re-establish a new baseline. Have counseled the patient at length regarding the importance of smoking cessation to help prevent further lung damage. Will consider repeating alpha-1 levels on follow up and re-visit importance of smoking cessation.  - Switched inhaler from [employer] to Trelegy, one puff daily in the morning. - Ordered pulmonary function test for new baseline. - Pulmonary Function Test; Future - Fluticasone -Umeclidin-Vilant (TRELEGY ELLIPTA ) 100-62.5-25 MCG/ACT AEPB; Inhale 1 puff into the lungs daily.  Dispense: 60 each; Refill: 11 - Ambulatory Referral for Lung Cancer Screening  #Nicotine  dependence  Long-standing nicotine  dependence with 75 pack-year history, currently smoking one pack per day. Previous cessation attempts unsuccessful. Discussed nicotine  lozenges as cessation aid and will prescribe. Will also refer to our lung cancer screening program  - Prescribed nicotine  lozenges for smoking cessation. - Encouraged use of nicotine  lozenges for cravings. - nicotine  polacrilex (COMMIT) 4 MG lozenge; Take 1 lozenge (4 mg total) by mouth every 2 (two) hours as needed for smoking cessation.  Dispense: 100 tablet; Refill: 0 - Ambulatory Referral for Lung Cancer Screening   Return in about 3 months (around 11/22/2024).  Belva November, MD [facility]  I spent 62 minutes caring for this patient today, including  preparing to see the patient, obtaining a medical history , reviewing a separately obtained history, performing a medically appropriate examination and/or evaluation, counseling and educating the patient/family/caregiver, ordering medications, tests, or procedures, documenting clinical information in the electronic health record, and independently interpreting results (not separately reported/billed) and communicating results to the patient/family/caregiver  End of visit medications:  Meds ordered this encounter  Medications   nicotine  polacrilex (COMMIT) 4 MG lozenge    Sig: Take 1 lozenge (4 mg total) by mouth every 2 (two) hours as needed for smoking cessation.    Dispense:  100 tablet    Refill:  0   Fluticasone -Umeclidin-Vilant (TRELEGY ELLIPTA ) 100-62.5-25 MCG/ACT AEPB    Sig: Inhale 1 puff into the lungs daily.    Dispense:  60 each    Refill:  11     Current Outpatient Medications:    albuterol  (PROVENTIL  HFA;VENTOLIN  HFA) 108 (90 Base) MCG/ACT inhaler, Inhale 2 puffs into the lungs every 4 (four) hours as needed for wheezing or shortness of breath., Disp: 1 Inhaler, Rfl: 1   aspirin  EC 81 MG tablet, Take 81 mg by mouth daily., Disp: , Rfl:    atorvastatin  (LIPITOR ) 80 MG tablet, Take 1 tablet (80 mg total) by mouth at bedtime., Disp: 90 tablet, Rfl: 3   Fluticasone -Umeclidin-Vilant (TRELEGY ELLIPTA ) 100-62.5-25 MCG/ACT AEPB, Inhale 1 puff into the lungs daily., Disp: 60 each, Rfl: 11   gabapentin  (NEURONTIN ) 300 MG capsule, Take 1 capsule (300 mg total) by mouth at bedtime., Disp: 90 capsule, Rfl: 3   hydrochlorothiazide  (HYDRODIURIL ) 12.5 MG tablet, Take 1 tablet (12.5 mg total) by mouth daily., Disp: 90 tablet, Rfl: 0   Insulin  Pen Needle 32G X 4 MM MISC, Use as directed with insulin  daily, Disp: 100 each, Rfl: 1  JANUVIA  100 MG tablet, Take 1 tablet (100 mg total) by mouth daily., Disp: 90 tablet, Rfl: 1   losartan  (COZAAR ) 50 MG tablet, Take 1 tablet (50 mg total) by mouth  every evening., Disp: 90 tablet, Rfl: 3   metoprolol  succinate (TOPROL -XL) 25 MG 24 hr tablet, Take 1 tablet (25 mg total) by mouth daily., Disp: 90 tablet, Rfl: 3   nicotine  polacrilex (COMMIT) 4 MG lozenge, Take 1 lozenge (4 mg total) by mouth every 2 (two) hours as needed for smoking cessation., Disp: 100 tablet, Rfl: 0   nitroGLYCERIN  (NITROSTAT ) 0.4 MG SL tablet, Place 1 tablet (0.4 mg total) under the tongue every 5 (five) minutes as needed for chest pain. Max of 3 total; call 911, Disp: 25 tablet, Rfl: 5   ondansetron  (ZOFRAN -ODT) 4 MG disintegrating tablet, Take 1 tablet (4 mg total) by mouth every 8 (eight) hours as needed for nausea or vomiting., Disp: 20 tablet, Rfl: 0   pantoprazole  (PROTONIX ) 40 MG tablet, Take 1 tablet (40 mg total) by mouth 2 (two) times daily., Disp: 180 tablet, Rfl: 1   Subjective:   PATIENT ID: Scott Rosario GENDER: male DOB: Apr 08, 1961, MRN: [id number]  Chief Complaint  Patient presents with   Consult    Cough, shortness of breath and wheezing on exertion.     HPI  Discussed the use of AI scribe software for clinical note transcription with the patient, who gave verbal consent to proceed.  History of Present Illness  Scott Rosario is a 63 year old male with COPD and emphysema who presents with shortness of breath. He was referred by Doctor Edman.  He has experienced worsening shortness of breath over the past three years, currently rated between six and seven out of ten in severity. He also experiences significant cough and phlegm production, both rated at five out of five on a symptom scale. Chest tightness is rated at four out of five, and he experiences severe breathlessness when walking up a hill or one flight of stairs, rated at five out of five. Despite these symptoms, he denies limitations in activities at home and reports confidence in leaving home. He sleeps well and has lots of energy. He has occasional wheezing.  He has been using  Stiolto at night, taking two breaths before bed, and previously used Trelegy over a year ago. The inhaler helps but does not fully resolve his symptoms. He has a history of COPD for approximately eight years, with symptoms worsening over the last three years. He has been seen by multiple pulmonologists, including Doctor Theotis, and has undergone pulmonary function testing in 2018, which showed an FEV1/FVC ratio of 0.62 with an FEV1 of 67% predicted post-bronchodilator challenge. His last CT scan in August 2024 showed resolution of a left lower lobe nodule and significant emphysema.  He has a history of coronary artery disease, with a cardiac catheterization in May 2025 showing multivessel disease treated medically. A previous catheterization in 2017 revealed 90% stenosis of the proximal first diagonal branch, treated with a drug-eluting stent. He has had two heart attacks in the past.  He has worked as a nutritional therapist for 49 years, with significant exposure to dust, sawdust, concrete dust, and other environmental factors. 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  He has a significant smoking history, having smoked since age 66. He smoked up to three packs a day until his first heart attack at age 58,  after which he reduced to one pack a day. He has smoked a pack a day for the last 20 years. He wants to quit smoking, noting that his son would also quit if he did. He has tried quitting cold turkey and using patches and gum in the past without success. He has around 70 to 75 pack years of smoking history.   Ancillary information including prior medications, full medical/surgical/family/social histories, and PFTs (when available) are listed below and have been reviewed.      08/22/2024   11:09 AM  CAT Score  Total CAT Score 19    Review of Systems  Constitutional:  Negative for chills and fever.  Respiratory:  Positive for cough, sputum production,  shortness of breath and wheezing. Negative for hemoptysis.   Cardiovascular:  Negative for chest pain.     Objective:   Vitals:   08/22/24 1044  BP: (!) 150/84  Pulse: 61  Temp: 97.6 F (36.4 C)  TempSrc: Temporal  SpO2: 99%  Weight: 163 lb (73.9 kg)  Height: 5' 8 (1.727 m)   99% on RA  BMI Readings from Last 3 Encounters:  08/22/24 24.78 kg/m  08/16/24 25.09 kg/m  07/24/24 24.96 kg/m   Wt Readings from Last 3 Encounters:  08/22/24 163 lb (73.9 kg)  08/16/24 165 lb (74.8 kg)  07/24/24 164 lb 2 oz (74.4 kg)   Physical Exam Constitutional:      Appearance: Normal appearance.  Cardiovascular:     Rate and Rhythm: Normal rate and regular rhythm.     Pulses: Normal pulses.     Heart sounds: Normal heart sounds.  Pulmonary:     Effort: Pulmonary effort is normal.     Breath sounds: Normal breath sounds. No wheezing or rales.  Neurological:     General: No focal deficit present.     Mental Status: He is alert and oriented to person, place, and time. Mental status is at baseline.       Ancillary Information    Past Medical History:  Diagnosis Date   Acute calculous cholecystitis    Alkaline phosphatase elevation    Aortic aneurysm 12/10/2018   3.4 cm March 2020   Aortic atherosclerosis    on CT 02/25/14   Asthma    CAD (coronary artery disease)    Cirrhosis (HCC)    Cirrhosis (HCC)    COPD (chronic obstructive pulmonary disease) (HCC)    Diabetes mellitus without complication (HCC)    Emphysema of lung (HCC)    Gallstone 12/10/2018   Gastritis without bleeding    GERD (gastroesophageal reflux disease)    Heart attack (HCC)    2005, 2017 (stent after each)   History of hiatal hernia    small   History of kidney stones    h/o   Hyperlipidemia    Hypertension    Ingrown toenail of both feet 11/29/2018   Pneumonia 2018   Pulmonary nodules    x 3, (5mm, 5mm, 4mm) chest CT February 25, 2014   Renal cyst    on CT 02/25/14   Tobacco use      Family History   Problem Relation Age of Onset   Diabetes Mother    Hypertension Mother    Arthritis Mother    Hyperlipidemia Mother    Heart attack Father 46   Epilepsy Father    Diabetes Father    Seizures Father    Heart disease Father    Hyperlipidemia Father    Hypertension  Father    Stroke Father    Healthy Sister    Diabetes Brother    Hyperlipidemia Brother    Hypertension Brother    Heart attack Brother 67   Coronary artery disease Brother    Cancer Maternal Grandmother        unknown   Diabetes Paternal Grandmother    Heart disease Paternal Grandmother    Hyperlipidemia Paternal Grandmother    Hypertension Paternal Grandmother    Heart attack Paternal Grandfather      Past Surgical History:  Procedure Laterality Date   APPENDECTOMY     CARDIAC CATHETERIZATION Left 03/09/2016   Procedure: Left Heart Cath and Coronary Angiography;  Surgeon: Wolm JINNY Rhyme, MD;  Location: [facility];  Service: Cardiovascular;  Laterality: Left;   CARDIAC CATHETERIZATION N/A 03/09/2016   Procedure: Coronary Stent Intervention;  Surgeon: Marsa Dooms, MD;  Location: [facility];  Service: Cardiovascular;  Laterality: N/A;   CHOLECYSTECTOMY N/A 01/31/2019   Procedure: LAPAROSCOPIC CHOLECYSTECTOMY - DIABETIC;  Surgeon: Nicholaus Selinda Birmingham, MD;  Location: [facility];  Service: General;  Laterality: N/A;   COLONOSCOPY WITH PROPOFOL  N/A 03/22/2019   Procedure: COLONOSCOPY WITH PROPOFOL ;  Surgeon: Jinny Carmine, MD;  Location: [facility];  Service: Endoscopy;  Laterality: N/A;  Diabetic - insulin  and oral meds   COLONOSCOPY WITH PROPOFOL  N/A 11/04/2020   Procedure: COLONOSCOPY WITH PROPOFOL ;  Surgeon: Janalyn Keene NOVAK, MD;  Location: [facility];  Service: Endoscopy;  Laterality: N/A;  COVID + 10-15-20   CORONARY ANGIOPLASTY WITH STENT PLACEMENT  10/25/2003   x 2   ESOPHAGOGASTRODUODENOSCOPY (EGD) WITH PROPOFOL  N/A 10/24/2017   Procedure: ESOPHAGOGASTRODUODENOSCOPY (EGD)  WITH PROPOFOL ;  Surgeon: Jinny Carmine, MD;  Location: [facility];  Service: Endoscopy;  Laterality: N/A;  Diabetic - oral meds   ESOPHAGOGASTRODUODENOSCOPY (EGD) WITH PROPOFOL  N/A 05/01/2020   Procedure: ESOPHAGOGASTRODUODENOSCOPY (EGD) WITH PROPOFOL ;  Surgeon: Janalyn Keene NOVAK, MD;  Location: [facility];  Service: Endoscopy;  Laterality: N/A;   ESOPHAGOGASTRODUODENOSCOPY (EGD) WITH PROPOFOL  N/A 11/04/2020   Procedure: ESOPHAGOGASTRODUODENOSCOPY (EGD) WITH PROPOFOL  with biopsy;  Surgeon: Janalyn Keene NOVAK, MD;  Location: [facility];  Service: Endoscopy;  Laterality: N/A;   LEFT HEART CATH AND CORONARY ANGIOGRAPHY Left 02/22/2024   Procedure: LEFT HEART CATH AND CORONARY ANGIOGRAPHY;  Surgeon: Florencio Cara BIRCH, MD;  Location: [facility];  Service: Cardiovascular;  Laterality: Left;   POLYPECTOMY  03/22/2019   Procedure: POLYPECTOMY;  Surgeon: Jinny Carmine, MD;  Location: [facility];  Service: Endoscopy;;   POLYPECTOMY  11/04/2020   Procedure: POLYPECTOMY;  Surgeon: Janalyn Keene NOVAK, MD;  Location: [facility];  Service: Endoscopy;;    Social History   Socioeconomic History   Marital status: Married    Spouse name: Not on file   Number of children: Not on file   Years of education: Not on file   Highest education level: Not on file  Occupational History   Not on file  Tobacco Use   Smoking status: Every Day    Current packs/day: 1.00    Average packs/day: 1 pack/day for 50.9 years (50.9 ttl pk-yrs)    Types: Cigarettes    Start date: 1975   Smokeless tobacco: Never   Tobacco comments:    since age 24  Vaping Use   Vaping status: Never Used  Substance and Sexual Activity   Alcohol use: No    Alcohol/week: 0.0 standard drinks of alcohol   Drug use: No  Sexual activity: Yes  Other Topics Concern   Not on file  Social History Narrative   Active and independent at baseline. Lives with wife, Apolinar, and no indoor pets.   Social  Drivers of Corporate Investment Banker Strain: Low Risk  (10/20/2023)   Received from [facility]   Overall Financial Resource Strain (CARDIA)    Difficulty of Paying Living Expenses: Not hard at all  Food Insecurity: No Food Insecurity (10/20/2023)   Received from [facility]   Hunger Vital Sign    Within the past 12 months, you worried that your food would run out before you got the money to buy more.: Never true    Within the past 12 months, the food you bought just didn't last and you didn't have money to get more.: Never true  Transportation Needs: No Transportation Needs (10/20/2023)   Received from [facility] - Transportation    In the past 12 months, has lack of transportation kept you from medical appointments or from getting medications?: No    Lack of Transportation (Non-Medical): No  Physical Activity: Not on file  Stress: Not on file  Social Connections: Not on file  Intimate Partner Violence: Not on file     Allergies  Allergen Reactions   Canagliflozin  Other (See Comments)    adverse reactions: Fatigue and Zombie feeling    Semaglutide  Diarrhea and Nausea And Vomiting    Ozempic   Acid Reflux   Tape     SURGICAL TAPE-RIPPED SKIN OFF     CBC    Component Value Date/Time   WBC 10.7 03/22/2024 0804   RBC 4.99 03/22/2024 0804   HGB 14.3 03/22/2024 0804   HGB 16.0 02/17/2016 0940   HCT 44.7 03/22/2024 0804   HCT 47.2 02/17/2016 0940   PLT 242 03/22/2024 0804   PLT 220 02/17/2016 0940   MCV 89.6 03/22/2024 0804   MCV 88 02/17/2016 0940   MCH 28.7 03/22/2024 0804   MCHC 32.0 03/22/2024 0804   RDW 13.1 03/22/2024 0804   RDW 13.9 02/17/2016 0940   LYMPHSABS 1.8 01/18/2024 1044   LYMPHSABS 1.8 02/17/2016 0940   MONOABS 1.2 (H) 01/18/2024 1044   EOSABS 246 03/22/2024 0804   EOSABS 0.2 02/17/2016 0940   BASOSABS 128 03/22/2024 0804   BASOSABS 0.1 02/17/2016 0940    Pulmonary Functions Testing  Results:     No data to display          Outpatient Medications Prior to Visit  Medication Sig Dispense Refill   albuterol  (PROVENTIL  HFA;VENTOLIN  HFA) 108 (90 Base) MCG/ACT inhaler Inhale 2 puffs into the lungs every 4 (four) hours as needed for wheezing or shortness of breath. 1 Inhaler 1   aspirin  EC 81 MG tablet Take 81 mg by mouth daily.     atorvastatin  (LIPITOR ) 80 MG tablet Take 1 tablet (80 mg total) by mouth at bedtime. 90 tablet 3   gabapentin  (NEURONTIN ) 300 MG capsule Take 1 capsule (300 mg total) by mouth at bedtime. 90 capsule 3   hydrochlorothiazide  (HYDRODIURIL ) 12.5 MG tablet Take 1 tablet (12.5 mg total) by mouth daily. 90 tablet 0   Insulin  Pen Needle 32G X 4 MM MISC Use as directed with insulin  daily 100 each 1   JANUVIA  100 MG tablet Take 1 tablet (100 mg total) by mouth daily. 90 tablet 1   losartan  (COZAAR ) 50 MG tablet Take 1 tablet (50 mg total) by mouth  every evening. 90 tablet 3   metoprolol  succinate (TOPROL -XL) 25 MG 24 hr tablet Take 1 tablet (25 mg total) by mouth daily. 90 tablet 3   nitroGLYCERIN  (NITROSTAT ) 0.4 MG SL tablet Place 1 tablet (0.4 mg total) under the tongue every 5 (five) minutes as needed for chest pain. Max of 3 total; call 911 25 tablet 5   ondansetron  (ZOFRAN -ODT) 4 MG disintegrating tablet Take 1 tablet (4 mg total) by mouth every 8 (eight) hours as needed for nausea or vomiting. 20 tablet 0   pantoprazole  (PROTONIX ) 40 MG tablet Take 1 tablet (40 mg total) by mouth 2 (two) times daily. 180 tablet 1   Tiotropium Bromide -Olodaterol (STIOLTO RESPIMAT ) 2.5-2.5 MCG/ACT AERS Inhale 2 inhalations into the lungs once daily 4 g 2   No facility-administered medications prior to visit.

## 2024-09-06 ENCOUNTER — Telehealth: Payer: Self-pay

## 2024-09-06 NOTE — Progress Notes (Signed)
 Complex Care Management Care Guide Note  09/06/2024 Name: Scott Rosario MRN: [id number] DOB: [birth year]  Scott Rosario is a 63 y.o. year old male who is a primary care patient of Edman Marsa PARAS, DO and is actively engaged with the care management team. I reached out to [name] by phone today to assist with re-scheduling  with the Pharmacist.  Follow up plan: Telephone appointment with complex care management team member scheduled for:  09/07/2024  [name] , RMA     [place]  [facility], [facility] Guide  Direct Dial: [phone]  Website: [email or web address]

## 2024-09-06 NOTE — Progress Notes (Signed)
 Complex Care Management Care Guide Note  09/06/2024 Name: Scott Rosario MRN: [id number] DOB: Feb 24, 1961  Scott Rosario is a 63 y.o. year old male who is a primary care patient of Edman Marsa PARAS, DO and is actively engaged with the care management team. I reached out to [name] by phone today to assist with re-scheduling  with the Pharmacist.  Follow up plan: Unsuccessful telephone outreach attempt made. A HIPAA compliant phone message was left for the patient providing contact information and requesting a return call.  [name] , RMA     [facility]  [facility], [facility] Guide  Direct Dial: [phone]  Website: [email or web address]

## 2024-09-07 ENCOUNTER — Telehealth: Payer: Self-pay | Admitting: Pharmacist

## 2024-09-07 ENCOUNTER — Other Ambulatory Visit

## 2024-09-07 NOTE — Progress Notes (Signed)
° °  Outreach Note  09/07/2024 Name: RUEL DIMMICK MRN: [id number] DOB: 1960-11-20  Referred by: Edman Marsa PARAS, DO  Was unable to reach patient via telephone today and have left HIPAA compliant voicemail asking patient to return my call.   Follow Up Plan: Will attempt to reach patient by telephone again within the next 2 weeks  Sharyle Sia, PharmD, JAQUELINE, CPP Clinical Pharmacist [facility] [phone]

## 2024-09-10 ENCOUNTER — Other Ambulatory Visit: Admitting: Pharmacist

## 2024-09-10 NOTE — Progress Notes (Unsigned)
 09/10/2024 Name: Scott Rosario MRN: [id number] DOB: 1961/07/12  Chief Complaint  Patient presents with   Medication Management   Medication Assistance    Scott Rosario is a 63 y.o. year old male who presented for a telephone visit.   They were referred to the pharmacist by their PCP for assistance in managing diabetes and medication access.      Subjective:   Care Team: Primary Care Provider: Edman Marsa PARAS, DO ; Next Scheduled Visit: 11/27/2024 Pulmonologist: Isadora Hose, MD; Next Scheduled Visit: 12/04/2024 Cardiologist: Hilarie Rocher, MD   Medication Access/Adherence  Current Pharmacy:  [facility], [place] - [address] [place] [place] [address] Phone: [phone] Fax: [phone]  [facility] [address] [place] [address] Phone: [phone] Fax: [phone]  [id number] [place], [place] - [address] [place] [address] Phone: [phone] Fax: [phone]   Patient reports affordability concerns with their medications: No Patient reports access/transportation concerns to their pharmacy: No  Patient reports adherence concerns with their medications:  No    Using weekly pillbox  Today shares that he is signed up for [facility] Enhanced plan for 2026 calendar year   Diabetes:   Current medications:  - metformin  ER 750 mg twice daily - Januvia  100 mg daily (started ~1 month ago)   Medications tried in the past: Reports stopped Rybelsus  as did not tolerate well (nausea); insulin  glargine; Invokana ; Ozempic  (acid reflux), Mounjaro  5 mg (nausea/vomiting)     Current glucose readings: morning fasting 125-148      Patient denies hypoglycemic s/sx including dizziness, shakiness, sweating.  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 Confirms rinsing and spitting out after each dose of Trelegy  Denies needing albuterol  much since started Trelegy     Objective:  Lab Results  Component Value Date   HGBA1C 7.5 (A) 07/24/2024    Lab Results  Component Value Date   CREATININE 0.77 03/22/2024   BUN 15 03/22/2024   NA 136 03/22/2024   K 4.5 03/22/2024   CL 102 03/22/2024   CO2 27 03/22/2024    Lab Results  Component Value Date   CHOL 95 03/22/2024   HDL 33 (L) 03/22/2024   LDLCALC 45 03/22/2024   TRIG 89 03/22/2024   CHOLHDL 2.9 03/22/2024   BP Readings from Last 3 Encounters:  08/22/24 (!) 150/84  08/16/24 ROLLEN)  142/84  07/24/24 132/80   Pulse Readings from Last 3 Encounters:  08/22/24 61  08/16/24 65  07/24/24 64     Medications Reviewed Today     Reviewed by Alana Sharyle LABOR, RPH-CPP (Pharmacist) on 09/10/24 at 1722  Med List Status: <None>   Medication Order Taking? Sig Documenting Provider Last Dose Status Informant  albuterol  (PROVENTIL  HFA;VENTOLIN  HFA) 108 (90 Base) MCG/ACT inhaler [id number]  Inhale 2 puffs  into the lungs every 4 (four) hours as needed for wheezing or shortness of breath. Rolly Almarie BRAVO, NP  Active Self  aspirin  EC 81 MG tablet [id number]  Take 81 mg by mouth daily. [provider]  Active Self  atorvastatin  (LIPITOR ) 80 MG tablet [id number] Yes Take 1 tablet (80 mg total) by mouth at bedtime. Karamalegos, Marsa PARAS, DO  Active   Fluticasone -Umeclidin-Vilant (TRELEGY ELLIPTA ) 100-62.5-25 MCG/ACT AEPB [id number]  Inhale 1 puff into the lungs daily. Isadora Hose, MD  Active   gabapentin  (NEURONTIN ) 300 MG capsule [id number] Yes Take 1 capsule (300 mg total) by mouth at bedtime. Edman Marsa PARAS, DO  Active     Discontinued 06/06/23 1011 (Change in therapy)   hydrochlorothiazide  (HYDRODIURIL ) 12.5 MG tablet [id number]  Take 1 tablet (12.5 mg total) by mouth daily. Edman Marsa PARAS, DO  Active   Insulin  Pen Needle 32G X 4 MM MISC [id number]  Use as directed with insulin  daily Tapia, Leisa, PA-C  Active   JANUVIA  100 MG tablet [id number] Yes Take 1 tablet (100 mg total) by mouth daily. Edman Marsa PARAS, DO  Active   losartan  (COZAAR ) 50 MG tablet [id number] Yes Take 1 tablet (50 mg total) by mouth every evening. Karamalegos, Marsa PARAS, DO  Active   metFORMIN  (GLUCOPHAGE -XR) 750 MG 24 hr tablet [id number] Yes Take 750 mg by mouth 2 (two) times daily. [provider]  Active   metoprolol  succinate (TOPROL -XL) 25 MG 24 hr tablet [id number] Yes Take 1 tablet (25 mg total) by mouth daily. Edman Marsa PARAS, DO  Active   nicotine  polacrilex (COMMIT) 4 MG lozenge [id number]  Take 1 lozenge (4 mg total) by mouth every 2 (two) hours as needed for smoking cessation. Isadora Hose, MD  Active   nitroGLYCERIN  (NITROSTAT ) 0.4 MG SL tablet [id number]  Place 1 tablet (0.4 mg total) under the tongue every 5 (five) minutes as needed for chest pain. Max of 3 total; call 911 Edman Marsa PARAS, DO  Active   ondansetron  (ZOFRAN -ODT) 4 MG disintegrating  tablet [id number]  Take 1 tablet (4 mg total) by mouth every 8 (eight) hours as needed for nausea or vomiting. Edman Marsa PARAS, DO  Active   pantoprazole  (PROTONIX ) 40 MG tablet [id number] Yes Take 1 tablet (40 mg total) by mouth 2 (two) times daily. Edman Marsa PARAS, DO  Active               Assessment/Plan:   Based on reported income, patient does not meet criteria for Januvia  or Trelegy patient assistance programs  Review with patient that per [facility] website, the [facility] Enhanced plan has a $100 annual prescription deductible (impacting tiers 3-5) and tier 3 copayment of 25% coinsurance (for medications such as Januvia  and Trelegy) in 2026. Note out of pocket prescription drug cost in 2026 is capped at $2,100.  Diabetes: - Currently uncontrolled - Reviewed long term cardiovascular and renal outcomes of uncontrolled blood sugar - Reviewed goal A1c, goal fasting, and goal 2 hour post prandial glucose - Reviewed dietary modifications including  importance of having regular well-balanced meals and snacks throughout the day, while controlling carbohydrate portion sizes - Recommend to check glucose, keep log of results and have this record to review at upcoming medical appointments. Patient to contact provider office sooner if needed for readings outside of established parameters or symptoms     Hypertension: - Reviewed long term cardiovascular and renal outcomes of uncontrolled blood pressure - Collaborate with PCP and advise patient to restart HCTZ 12.5 mg daily (in addition to current doses of losartan  and metoprolol  ER) for blood pressure control - Recommend to monitor home blood pressure, keep log of results and have this record to review at upcoming medical appointments. Patient to contact provider office sooner if needed for readings outside of established parameters or symptoms   COPD: - Reviewed appropriate inhaler technique, including importance of  rinsing out mouth after each use of Trelegy      Follow Up Plan: Clinical Pharmacist will follow up with patient by telephone on 10/08/2024 at 10:00 AM    Sharyle Sia, PharmD, [facility] [phone]

## 2024-09-13 NOTE — Patient Instructions (Signed)
 Goals Addressed             This Visit's Progress    Pharmacy Goals       The goal A1c is less than 7%. This is the best way to reduce the risk of the long term complications of diabetes, including heart disease, kidney disease, eye disease, strokes, and nerve damage. An A1c of less than 7% corresponds with fasting sugars less than 130 and 2 hour after meal sugars less than 180.    Check your blood pressure once daily, and any time you have concerning symptoms like headache, chest pain, dizziness, shortness of breath, or vision changes.   Our goal is less than 130/80.  To appropriately check your blood pressure, make sure you do the following:  1) Avoid caffeine, exercise, or tobacco products for 30 minutes before checking. Empty your bladder. 2) Sit with your back supported in a flat-backed chair. Rest your arm on something flat (arm of the chair, table, etc). 3) Sit still with your feet flat on the floor, resting, for at least 5 minutes.  4) Check your blood pressure. Take 1-2 readings.  5) Write down these readings and bring with you to any provider appointments.  Bring your home blood pressure machine with you to a provider's office for accuracy comparison at least once a year.   Make sure you take your blood pressure medications before you come to any office visit, even if you were asked to fast for labs.    Thank you!   Arthur Lash, PharmD, Becky Bowels, CPP Clinical Pharmacist [facility] [phone]

## 2024-09-14 ENCOUNTER — Telehealth: Payer: Self-pay

## 2024-09-14 ENCOUNTER — Other Ambulatory Visit: Payer: Self-pay

## 2024-09-14 DIAGNOSIS — Z860100 Personal history of colon polyps, unspecified: Secondary | ICD-10-CM

## 2024-09-14 MED ORDER — NA SULFATE-K SULFATE-MG SULF 17.5-3.13-1.6 GM/177ML PO SOLN: 1.0000 | Freq: Once | ORAL | 0 refills | Status: AC

## 2024-09-14 NOTE — Telephone Encounter (Signed)
 Gastroenterology Pre-Procedure Review  Request Date: 12/20/24 Requesting Physician: Dr. Jinny  PATIENT REVIEW QUESTIONS: The patient responded to the following health history questions as indicated:    1. Are you having any GI issues? no 2. Do you have a personal history of Polyps? yes (last colonoscopy performed by Dr. Janalyn 11/04/20 recommended repeat in 3 years ) 3. Do you have a family history of Colon Cancer or Polyps? no 4. Diabetes Mellitus? yes (takes Januvia  and Metformin  stop dates have been noted on instructions) 5. Joint replacements in the past 12 months?no 6. Major health problems in the past 3 months?no 7. Any artificial heart valves, MVP, or defibrillator?no 8. Cardiac history? CAD Atherosclerosis, Aortic aneurysm Cardiac Clearance sent to Dr. Hilarie 9. Pulmonary history? Emphysema clearance sent to pulmonary.    MEDICATIONS & ALLERGIES:    Patient reports the following regarding taking any anticoagulation/antiplatelet therapy:   Plavix , Coumadin, Eliquis, Xarelto, Lovenox , Pradaxa, Brilinta, or Effient? no Aspirin ? yes (81 MG DAILY)  Patient confirms/reports the following medications:  Current Outpatient Medications  Medication Sig Dispense Refill   albuterol  (PROVENTIL  HFA;VENTOLIN  HFA) 108 (90 Base) MCG/ACT inhaler Inhale 2 puffs into the lungs every 4 (four) hours as needed for wheezing or shortness of breath. 1 Inhaler 1   aspirin  EC 81 MG tablet Take 81 mg by mouth daily.     atorvastatin  (LIPITOR ) 80 MG tablet Take 1 tablet (80 mg total) by mouth at bedtime. 90 tablet 3   Fluticasone -Umeclidin-Vilant (TRELEGY ELLIPTA ) 100-62.5-25 MCG/ACT AEPB Inhale 1 puff into the lungs daily. 60 each 11   gabapentin  (NEURONTIN ) 300 MG capsule Take 1 capsule (300 mg total) by mouth at bedtime. 90 capsule 3   hydrochlorothiazide  (HYDRODIURIL ) 12.5 MG tablet Take 1 tablet (12.5 mg total) by mouth daily. 90 tablet 0   Insulin  Pen Needle 32G X 4 MM MISC Use as directed with  insulin  daily 100 each 1   JANUVIA  100 MG tablet Take 1 tablet (100 mg total) by mouth daily. 90 tablet 1   losartan  (COZAAR ) 50 MG tablet Take 1 tablet (50 mg total) by mouth every evening. 90 tablet 3   metFORMIN  (GLUCOPHAGE -XR) 750 MG 24 hr tablet Take 750 mg by mouth 2 (two) times daily.     metoprolol  succinate (TOPROL -XL) 25 MG 24 hr tablet Take 1 tablet (25 mg total) by mouth daily. 90 tablet 3   nicotine  polacrilex (COMMIT) 4 MG lozenge Take 1 lozenge (4 mg total) by mouth every 2 (two) hours as needed for smoking cessation. 100 tablet 0   nitroGLYCERIN  (NITROSTAT ) 0.4 MG SL tablet Place 1 tablet (0.4 mg total) under the tongue every 5 (five) minutes as needed for chest pain. Max of 3 total; call 911 25 tablet 5   ondansetron  (ZOFRAN -ODT) 4 MG disintegrating tablet Take 1 tablet (4 mg total) by mouth every 8 (eight) hours as needed for nausea or vomiting. 20 tablet 0   pantoprazole  (PROTONIX ) 40 MG tablet Take 1 tablet (40 mg total) by mouth 2 (two) times daily. 180 tablet 1   No current facility-administered medications for this visit.    Patient confirms/reports the following allergies:  Allergies[1]  No orders of the defined types were placed in this encounter.   AUTHORIZATION INFORMATION Primary Insurance: 1D#: Group #:  Secondary Insurance: 1D#: Group #:  SCHEDULE INFORMATION: Date: 12/20/24 Time: Location: [facility]     [1]  Allergies Allergen Reactions   Canagliflozin  Other (See Comments)    adverse reactions: Fatigue and Zombie feeling  Semaglutide  Diarrhea and Nausea And Vomiting    Ozempic   Acid Reflux   Tape     SURGICAL TAPE-RIPPED SKIN OFF

## 2024-09-17 LAB — OPHTHALMOLOGY REPORT-SCANNED

## 2024-10-01 ENCOUNTER — Telehealth: Payer: Self-pay

## 2024-10-01 ENCOUNTER — Other Ambulatory Visit (HOSPITAL_COMMUNITY): Payer: Self-pay

## 2024-10-01 ENCOUNTER — Other Ambulatory Visit: Payer: Self-pay | Admitting: Family Medicine

## 2024-10-01 ENCOUNTER — Other Ambulatory Visit: Admitting: Pharmacist

## 2024-10-01 DIAGNOSIS — E1169 Type 2 diabetes mellitus with other specified complication: Secondary | ICD-10-CM

## 2024-10-01 DIAGNOSIS — K219 Gastro-esophageal reflux disease without esophagitis: Secondary | ICD-10-CM

## 2024-10-01 DIAGNOSIS — I10 Essential (primary) hypertension: Secondary | ICD-10-CM

## 2024-10-01 DIAGNOSIS — J432 Centrilobular emphysema: Secondary | ICD-10-CM

## 2024-10-01 DIAGNOSIS — Z7984 Long term (current) use of oral hypoglycemic drugs: Secondary | ICD-10-CM

## 2024-10-01 NOTE — Patient Instructions (Signed)
 Goals Addressed             This Visit's Progress    Pharmacy Goals       The goal A1c is less than 7%. This is the best way to reduce the risk of the long term complications of diabetes, including heart disease, kidney disease, eye disease, strokes, and nerve damage. An A1c of less than 7% corresponds with fasting sugars less than 130 and 2 hour after meal sugars less than 180.    Check your blood pressure once daily, and any time you have concerning symptoms like headache, chest pain, dizziness, shortness of breath, or vision changes.   Our goal is less than 130/80.  To appropriately check your blood pressure, make sure you do the following:  1) Avoid caffeine, exercise, or tobacco products for 30 minutes before checking. Empty your bladder. 2) Sit with your back supported in a flat-backed chair. Rest your arm on something flat (arm of the chair, table, etc). 3) Sit still with your feet flat on the floor, resting, for at least 5 minutes.  4) Check your blood pressure. Take 1-2 readings.  5) Write down these readings and bring with you to any provider appointments.  Bring your home blood pressure machine with you to a provider's office for accuracy comparison at least once a year.   Make sure you take your blood pressure medications before you come to any office visit, even if you were asked to fast for labs.    Thank you!   Arthur Lash, PharmD, Becky Bowels, CPP Clinical Pharmacist [facility] [phone]

## 2024-10-01 NOTE — Telephone Encounter (Signed)
 Pharmacy Patient Advocate Encounter  Insurance verification completed.   The patient is insured through [facility]   Ran test claim for Januvia . Currently a quantity of 30 is a 30 day supply and the co-pay is $31.81 .   This test claim was processed through [facility]- copay amounts may vary at other pharmacies due to pharmacy/plan contracts, or as the patient moves through the different stages of their insurance plan.

## 2024-10-01 NOTE — Progress Notes (Signed)
 "  10/01/2024 Name: Scott Rosario MRN: [id number] DOB: Jun 21, 1961  Chief Complaint  Patient presents with   Medication Management   Medication Assistance    Scott Rosario is a 64 y.o. year old male who presented for a telephone visit.   They were referred to the pharmacist by their PCP for assistance in managing diabetes and medication access.      Subjective:   Care Team: Primary Care Provider: Edman Marsa PARAS, DO ; Next Scheduled Visit: 11/27/2024 Pulmonologist: Isadora Hose, MD; Next Scheduled Visit: 12/04/2024 Cardiologist: Hilarie Rocher, MD  Medication Access/Adherence  Current Pharmacy:  [facility], [place] - [address] [place] [place] [address] Phone: [phone] Fax: [phone]  [facility] - [facility] [address] [place] [address] Phone: [phone] Fax: [phone]  [id number] - [place], [place] - [address] [place] [address] Phone: [phone] Fax: [phone]   Patient reports affordability concerns with their medications: Yes Patient reports access/transportation concerns to their pharmacy: No  Patient reports adherence concerns with their medications:  No     Using weekly pillbox  Today patient shares that he has requested refills of several of his medications today from [facility] and is finding cost to be expensive; notes cost of his Trelegy inhaler is ~$200 for 1 month supply     Diabetes:   Current medications:  - metformin  ER 750 mg twice daily - Januvia  100 mg daily (started ~1 month ago)   Medications tried in the past: Reports stopped Rybelsus  as did not tolerate well (nausea); insulin  glargine; Invokana ; Ozempic  (acid reflux), Mounjaro  5 mg (nausea/vomiting)     Denies checking home blood sugar recently       Statin therapy: atorvastatin  80 mg daily   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     Hypertension:   Current medications:  - losartan  50 mg daily - metoprolol  ER 25 mg daily - Restarted HCTZ 12.5 mg daily each morning ~09/11/2024   Previous therapies tried: amlodipine  (self-discontinued in September due to leg swelling)   Denies checking home blood pressure recently     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 Confirms rinsing and spitting out after each dose of Trelegy   Denies needing albuterol  much since started Trelegy   Objective:  Lab Results  Component Value Date   HGBA1C 7.5 (A) 07/24/2024    Lab Results  Component Value Date   CREATININE 0.77 03/22/2024   BUN 15 03/22/2024   NA 136 03/22/2024   K 4.5 03/22/2024   CL 102 03/22/2024   CO2 27 03/22/2024    Lab Results  Component Value Date   CHOL 95 03/22/2024   HDL 33 (L) 03/22/2024   LDLCALC 45 03/22/2024   TRIG 89 03/22/2024   CHOLHDL 2.9 03/22/2024   BP Readings from Last 3 Encounters:  08/22/24 (!) 150/84  08/16/24 (!) 142/84  07/24/24 132/80   Pulse Readings from Last 3 Encounters:  08/22/24 61  08/16/24 65  07/24/24 64     Medications  Reviewed Today     Reviewed by Alana Sharyle LABOR, RPH-CPP (Pharmacist) on 10/01/24 at 1633  Med List Status: <None>   Medication Order Taking? Sig Documenting Provider Last Dose Status Informant  albuterol  (PROVENTIL  HFA;VENTOLIN  HFA) 108 (90 Base) MCG/ACT inhaler [id number]  Inhale 2 puffs into the lungs every 4 (four) hours as needed for wheezing or shortness of breath. Rolly Almarie BRAVO, NP  Active Self  aspirin  EC 81 MG tablet [id number]  Take 81 mg by mouth daily. [provider]  Active Self   atorvastatin  (LIPITOR ) 80 MG tablet [id number]  Take 1 tablet (80 mg total) by mouth at bedtime. Karamalegos, Marsa PARAS, DO  Active   Fluticasone -Umeclidin-Vilant (TRELEGY ELLIPTA ) 100-62.5-25 MCG/ACT AEPB [id number]  Inhale 1 puff into the lungs daily. Isadora Hose, MD  Active   gabapentin  (NEURONTIN ) 300 MG capsule [id number]  Take 1 capsule (300 mg total) by mouth at bedtime. Edman Marsa PARAS, DO  Active     Discontinued 06/06/23 1011 (Change in therapy)   hydrochlorothiazide  (HYDRODIURIL ) 12.5 MG tablet [id number] Yes Take 1 tablet (12.5 mg total) by mouth daily. Edman Marsa PARAS, DO  Active   Insulin  Pen Needle 32G X 4 MM MISC [id number]  Use as directed with insulin  daily Tapia, Leisa, PA-C  Active   JANUVIA  100 MG tablet [id number] Yes Take 1 tablet (100 mg total) by mouth daily. Edman Marsa PARAS, DO  Active   losartan  (COZAAR ) 50 MG tablet [id number]  Take 1 tablet (50 mg total) by mouth every evening. Karamalegos, Marsa PARAS, DO  Active   metFORMIN  (GLUCOPHAGE -XR) 750 MG 24 hr tablet [id number] Yes Take 750 mg by mouth 2 (two) times daily. [provider]  Active   metoprolol  succinate (TOPROL -XL) 25 MG 24 hr tablet [id number] Yes Take 1 tablet (25 mg total) by mouth daily. Edman Marsa PARAS, DO  Active   nicotine  polacrilex (COMMIT) 4 MG lozenge [id number]  Take 1 lozenge (4 mg total) by mouth every 2 (two) hours as needed for smoking cessation. Isadora Hose, MD  Active   nitroGLYCERIN  (NITROSTAT ) 0.4 MG SL tablet [id number]  Place 1 tablet (0.4 mg total) under the tongue every 5 (five) minutes as needed for chest pain. Max of 3 total; call 911 Edman Marsa PARAS, DO  Active   ondansetron  (ZOFRAN -ODT) 4 MG disintegrating tablet [id number]  Take 1 tablet (4 mg total) by mouth every 8 (eight) hours as needed for nausea or vomiting. Karamalegos, Marsa PARAS, DO  Active   pantoprazole  (PROTONIX ) 40 MG tablet [id number]  Take 1 tablet (40 mg total) by mouth  2 (two) times daily. Edman Marsa PARAS, DO  Active               Assessment/Plan:   Based on reported income, patient does not meet criteria for Januvia  or Trelegy patient assistance programs   Review with patient that per [facility] website, the [facility] Enhanced plan has a $100 annual prescription deductible (impacting tiers 3-5) and tier 3 copayment of 25% coinsurance (for medications such as Januvia  and Trelegy) in 2026. Note out of pocket prescription drug cost in 2026 is capped at $2,100. - From review of preferred pharmacy list from [facility] 2026 Pharmacy Directory, note that [facility] is listed as a preferred cost sharing pharmacy for [employer]    Diabetes: - Reviewed long term cardiovascular and renal outcomes of uncontrolled blood sugar - Reviewed goal A1c, goal fasting, and goal 2 hour post prandial glucose - Reviewed dietary modifications  including importance of having regular well-balanced meals and snacks throughout the day, while controlling carbohydrate portion sizes - Recommend to restart checking glucose, keep log of results and have this record to review at upcoming medical appointments. Patient to contact provider office sooner if needed for readings outside of established parameters or symptoms     Hypertension: - Reviewed long term cardiovascular and renal outcomes of uncontrolled blood pressure - Recommend to restart monitoring home blood pressure, keep log of results and have this record to review at upcoming medical appointments. Patient to contact provider office sooner if needed for readings outside of established parameters or symptoms   COPD: - Reviewed appropriate inhaler technique, including importance of rinsing out mouth after each use of Trelegy         Follow Up Plan: Clinical Pharmacist will follow up with patient by telephone on 10/17/2024 at 2:00 PM  to review home blood pressure and blood sugar monitoring results   Sharyle Sia, PharmD, [facility] [phone]     "

## 2024-10-02 ENCOUNTER — Other Ambulatory Visit: Payer: Self-pay

## 2024-10-02 DIAGNOSIS — Z87891 Personal history of nicotine dependence: Secondary | ICD-10-CM

## 2024-10-02 DIAGNOSIS — Z122 Encounter for screening for malignant neoplasm of respiratory organs: Secondary | ICD-10-CM

## 2024-10-02 DIAGNOSIS — F17210 Nicotine dependence, cigarettes, uncomplicated: Secondary | ICD-10-CM

## 2024-10-02 NOTE — Telephone Encounter (Signed)
 Cardiac clearance has been granted from [facility] Cardiologist Dr. Dearl.  Patient is cleared to have procedure per fax received on 10/01/24.  Thanks,  Rosaline CMA

## 2024-10-02 NOTE — Telephone Encounter (Signed)
 Requested Prescriptions  Refused Prescriptions Disp Refills   pantoprazole  (PROTONIX ) 40 MG tablet [Pharmacy Med Name: PANTOPRAZOLE  SODIUM 40 MG DR TAB] 180 tablet 1    Sig: TAKE 1 TABLET BY MOUTH TWICE DAILY     Gastroenterology: Proton Pump Inhibitors Passed - 10/02/2024  2:30 PM      Passed - Valid encounter within last 12 months    Recent Outpatient Visits           1 month ago Centrilobular emphysema [facility])   [place] [facility] [place], Marsa PARAS, DO   2 months ago Type 2 diabetes mellitus with other specified complication, without long-term current use of insulin  [facility])   [place] [facility] [place], Marsa PARAS, DO   3 months ago Nausea and vomiting, unspecified vomiting type   [facility] Edman Marsa PARAS, DO   3 months ago Bilateral lower extremity edema   [place] [facility] [place], Marsa PARAS, DO   6 months ago Annual physical exam   [facility] [place], Marsa PARAS, [place]

## 2024-10-03 ENCOUNTER — Telehealth: Payer: Self-pay

## 2024-10-03 NOTE — Telephone Encounter (Signed)
 Mrs. Maahs contacted office stated that her husband has been experiencing diarrhea and food has been getting stuck in throat.  [facility] has scheduled an appt for patient to be seen by Grayce, NP on 10/22/24 at 10am.  Patient has already been scheduled for a colonoscopy with Dr. Jinny on 12/20/24 (colon polyps).  Previous Colonoscopy w/EGD was performed by Dr. Janalyn 11/06/20.  Cardiac clearance has already been granted. Pulmonary clearance is still pending-Dr. Winferd Hose.  Thanks,  Kamaili, CMA

## 2024-10-04 NOTE — Telephone Encounter (Signed)
 Pulmonary Clearance Granted from Dr. Hilarie Rocher on 10/03/24.    Patient is cleared to have procedure.  Thanks,  Rosaline CMA

## 2024-10-08 ENCOUNTER — Other Ambulatory Visit

## 2024-10-10 ENCOUNTER — Inpatient Hospital Stay: Admission: RE | Admit: 2024-10-10 | Discharge: 2024-10-10 | Attending: Acute Care | Admitting: Acute Care

## 2024-10-10 DIAGNOSIS — F17210 Nicotine dependence, cigarettes, uncomplicated: Secondary | ICD-10-CM

## 2024-10-10 DIAGNOSIS — Z87891 Personal history of nicotine dependence: Secondary | ICD-10-CM

## 2024-10-10 DIAGNOSIS — Z122 Encounter for screening for malignant neoplasm of respiratory organs: Secondary | ICD-10-CM

## 2024-10-17 ENCOUNTER — Other Ambulatory Visit: Admitting: Pharmacist

## 2024-10-17 DIAGNOSIS — E1169 Type 2 diabetes mellitus with other specified complication: Secondary | ICD-10-CM

## 2024-10-17 DIAGNOSIS — J432 Centrilobular emphysema: Secondary | ICD-10-CM

## 2024-10-17 DIAGNOSIS — I10 Essential (primary) hypertension: Secondary | ICD-10-CM

## 2024-10-17 DIAGNOSIS — Z7984 Long term (current) use of oral hypoglycemic drugs: Secondary | ICD-10-CM

## 2024-10-17 NOTE — Progress Notes (Unsigned)
 "  10/17/2024 Name: Scott Rosario MRN: [id number] DOB: Oct 06, 1960  No chief complaint on file.   Scott Rosario is a 64 y.o. year old male who presented for a telephone visit.   They were referred to the pharmacist by their PCP for assistance in managing diabetes and medication access.      Subjective:   Care Team: Primary Care Provider: Edman Marsa PARAS, DO ; Next Scheduled Visit: 11/27/2024 Pulmonologist: Isadora Hose, MD; Next Scheduled Visit: 12/04/2024 Cardiologist: Hilarie Rocher, MD  Medication Access/Adherence  Current Pharmacy:  [facility], [place] - [address] [place] [address] Phone: [phone] Fax: [phone]  [facility] - [facility] [address] [place] [address] Phone: [phone] Fax: [phone]  [id number] - [place], [place] - [address] [place] [address] Phone: [phone] Fax: [phone]   Patient reports affordability concerns with their medications: No Patient reports access/transportation concerns to their pharmacy: No  Patient reports adherence concerns with their medications:  No     Using weekly pillbox   Hypertension:   Current medications:  - losartan  50 mg daily - metoprolol  ER 25 mg daily - HCTZ 12.5 mg daily each morning  (restarted ~09/11/2024)   Previous therapies tried: amlodipine  (self-discontinued in September due to leg swelling)   Recalls recent home blood pressure readings ranging 135-148/69-79, but does not have specific readings to review today - Admits to smoking around/during the time of these reading   Reports spouse lightly salts his food during cooking, but denies adding salt to his food while eating  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     COPD:   Patient [facility]    Current medications:  - Trelegy 100-62.5-25 mcg - 1 puff  daily - albuterol  HFA - 2 puffs every 4 hours as needed   Medications tried in the past: Stiolto   Rinsing and spitting out after each dose of Trelegy    Tobacco Abuse:  Reports has cut back to 10-12 cigarettes/day  Motivation: coughing, impact on his breathing (discussion with his lung doctor)  Triggers: work, stress  Strategies: being mindful, chewing sugar-free gum; working on walking away from a situation when stressed  Currently using Nicotine  lozenges from lung doctor and found that these helped with urge to smoke, but did not like after taste/noticed headache side effect.  Reports tried Nicotine  patches and did not notice benefit. Reports tired gum, but did not notice benefit with this either    Objective:  Lab Results  Component Value Date   HGBA1C 7.5 (A) 07/24/2024    Lab Results  Component Value Date   CREATININE 0.77 03/22/2024   BUN 15 03/22/2024   NA 136 03/22/2024   K 4.5 03/22/2024   CL 102 03/22/2024   CO2 27 03/22/2024    Lab Results  Component Value Date   CHOL 95 03/22/2024   HDL 33 (L) 03/22/2024   LDLCALC 45 03/22/2024   TRIG 89 03/22/2024   CHOLHDL 2.9 03/22/2024   BP Readings from Last 3 Encounters:  08/22/24 (!) 150/84  08/16/24 (!) 142/84  07/24/24 132/80   Pulse Readings from Last 3 Encounters:  08/22/24 61  08/16/24 65  07/24/24 64    Medications Reviewed Today   Medications were not reviewed in this encounter       Assessment/Plan:   Hypertension: - Reviewed  long term cardiovascular and renal outcomes of uncontrolled blood pressure - Recommend to restart monitoring home blood pressure, keep log of results and have this record to review at upcoming medical appointments. Patient to contact provider office sooner if needed for readings outside of established parameters or symptoms   COPD: - Reviewed appropriate inhaler technique, including importance of rinsing out mouth after each use of Trelegy  Tobacco Abuse -  Currently uncontrolled - Provided motivational interviewing to assess tobacco use and strategies for reduction - Patient plans to continue using nicotine  lozenges as needed    Follow Up Plan: Clinical Pharmacist will follow up with patient by telephone on 11/14/2024 at 2:00 PM   Sharyle Sia, PharmD, JAQUELINE, CPP Clinical Pharmacist [facility] [phone]    "

## 2024-10-18 NOTE — Patient Instructions (Signed)
 Goals Addressed             This Visit's Progress    Pharmacy Goals       The goal A1c is less than 7%. This is the best way to reduce the risk of the long term complications of diabetes, including heart disease, kidney disease, eye disease, strokes, and nerve damage. An A1c of less than 7% corresponds with fasting sugars less than 130 and 2 hour after meal sugars less than 180.    Check your blood pressure once daily, and any time you have concerning symptoms like headache, chest pain, dizziness, shortness of breath, or vision changes.   Our goal is less than 130/80.  To appropriately check your blood pressure, make sure you do the following:  1) Avoid caffeine, exercise, or tobacco products for 30 minutes before checking. Empty your bladder. 2) Sit with your back supported in a flat-backed chair. Rest your arm on something flat (arm of the chair, table, etc). 3) Sit still with your feet flat on the floor, resting, for at least 5 minutes.  4) Check your blood pressure. Take 1-2 readings.  5) Write down these readings and bring with you to any provider appointments.  Bring your home blood pressure machine with you to a provider's office for accuracy comparison at least once a year.   Make sure you take your blood pressure medications before you come to any office visit, even if you were asked to fast for labs.    Thank you!   Arthur Lash, PharmD, Becky Bowels, CPP Clinical Pharmacist [facility] [phone]

## 2024-10-19 ENCOUNTER — Telehealth: Payer: Self-pay

## 2024-10-19 NOTE — Telephone Encounter (Signed)
 The patient was contacted to reschedule his appointment due to weather conditions. The appointment was rescheduled for 10/30/24 at 3:00 PM with Grayce Bohr.

## 2024-10-20 ENCOUNTER — Other Ambulatory Visit: Payer: Self-pay | Admitting: Family Medicine

## 2024-10-20 DIAGNOSIS — E1169 Type 2 diabetes mellitus with other specified complication: Secondary | ICD-10-CM

## 2024-10-22 ENCOUNTER — Ambulatory Visit: Admitting: Family Medicine

## 2024-10-22 ENCOUNTER — Other Ambulatory Visit: Payer: Self-pay

## 2024-10-22 DIAGNOSIS — Z122 Encounter for screening for malignant neoplasm of respiratory organs: Secondary | ICD-10-CM

## 2024-10-22 DIAGNOSIS — F17210 Nicotine dependence, cigarettes, uncomplicated: Secondary | ICD-10-CM

## 2024-10-22 DIAGNOSIS — Z87891 Personal history of nicotine dependence: Secondary | ICD-10-CM

## 2024-10-22 NOTE — Telephone Encounter (Signed)
 Refilled 07/25/24 # 90 with 1 refill. Requested Prescriptions  Refused Prescriptions Disp Refills   JANUVIA  100 MG tablet [Pharmacy Med Name: JANUVIA  100 MG TAB] 90 tablet 1    Sig: TAKE 1 TABLET BY MOUTH ONCE DAILY     Endocrinology:  Diabetes - DPP-4 Inhibitors Passed - 10/22/2024 12:20 PM      Passed - HBA1C is between 0 and 7.9 and within 180 days    Hemoglobin A1C  Date Value Ref Range Status  07/24/2024 7.5 (A) 4.0 - 5.6 % Final   Hgb A1c MFr Bld  Date Value Ref Range Status  03/22/2024 8.5 (H) <5.7 % Final    Comment:    For someone without known diabetes, a hemoglobin A1c value of 6.5% or greater indicates that they may have  diabetes and this should be confirmed with a follow-up  test. . For someone with known diabetes, a value <7% indicates  that their diabetes is well controlled and a value  greater than or equal to 7% indicates suboptimal  control. A1c targets should be individualized based on  duration of diabetes, age, comorbid conditions, and  other considerations. . Currently, no consensus exists regarding use of hemoglobin A1c for diagnosis of diabetes for children. .          Passed - Cr in normal range and within 360 days    Creat  Date Value Ref Range Status  03/22/2024 0.77 0.70 - 1.35 mg/dL Final   Creatinine, Urine  Date Value Ref Range Status  03/22/2024 116 20 - 320 mg/dL Final         Passed - Valid encounter within last 6 months    Recent Outpatient Visits           2 months ago Centrilobular emphysema [facility])   [facility] [facility] [place], Marsa PARAS, DO   3 months ago Type 2 diabetes mellitus with other specified complication, without long-term current use of insulin  [facility])   [place] [facility] [place], Marsa PARAS, DO   3 months ago Nausea and vomiting, unspecified vomiting type   [facility] [place]man Marsa PARAS, DO   4 months ago Bilateral lower  extremity edema   [place] [facility] [place], Marsa PARAS, DO   6 months ago Annual physical exam   [facility] [place], Marsa PARAS, [place]

## 2024-10-29 ENCOUNTER — Ambulatory Visit: Admitting: Family Medicine

## 2024-11-07 ENCOUNTER — Encounter

## 2024-11-07 ENCOUNTER — Ambulatory Visit: Admitting: Student in an Organized Health Care Education/Training Program

## 2024-11-08 ENCOUNTER — Ambulatory Visit: Admitting: Family Medicine

## 2024-11-14 ENCOUNTER — Other Ambulatory Visit

## 2024-11-27 ENCOUNTER — Ambulatory Visit: Admitting: Family Medicine

## 2024-12-04 ENCOUNTER — Ambulatory Visit: Admitting: Student in an Organized Health Care Education/Training Program

## 2024-12-04 ENCOUNTER — Encounter

## 2024-12-20 ENCOUNTER — Ambulatory Visit: Admit: 2024-12-20 | Payer: Self-pay | Admitting: Gastroenterology

## 2024-12-20 SURGERY — COLONOSCOPY
Anesthesia: General
